# Patient Record
Sex: Female | Born: 1935 | Race: White | Hispanic: No | Marital: Single | Smoking: Never smoker
Health system: Southern US, Community
[De-identification: ages and names within clinical notes are randomized; demographics above are authoritative.]

## PROBLEM LIST (undated history)

## (undated) DIAGNOSIS — J45909 Unspecified asthma, uncomplicated: Secondary | ICD-10-CM

## (undated) DIAGNOSIS — C210 Malignant neoplasm of anus, unspecified: Secondary | ICD-10-CM

## (undated) DIAGNOSIS — D649 Anemia, unspecified: Secondary | ICD-10-CM

## (undated) DIAGNOSIS — I10 Essential (primary) hypertension: Secondary | ICD-10-CM

## (undated) DIAGNOSIS — E785 Hyperlipidemia, unspecified: Secondary | ICD-10-CM

## (undated) DIAGNOSIS — I509 Heart failure, unspecified: Secondary | ICD-10-CM

## (undated) HISTORY — DX: Essential (primary) hypertension: I10

## (undated) HISTORY — DX: Malignant neoplasm of anus, unspecified: C21.0

## (undated) HISTORY — DX: Hyperlipidemia, unspecified: E78.5

## (undated) HISTORY — PX: OTHER SURGICAL HISTORY: SHX169

## (undated) HISTORY — PX: TONSILLECTOMY: SUR1361

## (undated) HISTORY — PX: BLADDER SURGERY: SHX569

## (undated) HISTORY — PX: CARDIAC CATHETERIZATION: SHX172

## (undated) HISTORY — PX: COLOSTOMY: SHX63

## (undated) MED FILL — Iron Sucrose Inj 20 MG/ML (Fe Equiv): INTRAVENOUS | Qty: 10 | Status: AC

---

## 2004-05-13 ENCOUNTER — Inpatient Hospital Stay: Payer: Self-pay | Admitting: Internal Medicine

## 2004-05-30 ENCOUNTER — Ambulatory Visit: Payer: Self-pay | Admitting: Family Medicine

## 2004-05-30 IMAGING — US ABDOMEN ULTRASOUND
1 series · 17 of 25 positions shown · non-contrast
Comparison: none

REASON FOR EXAM: Epigastric pain
COMMENTS:

[Series 1: abdomen ultrasound · 17 of 80 slices shown]
[im 1/80]
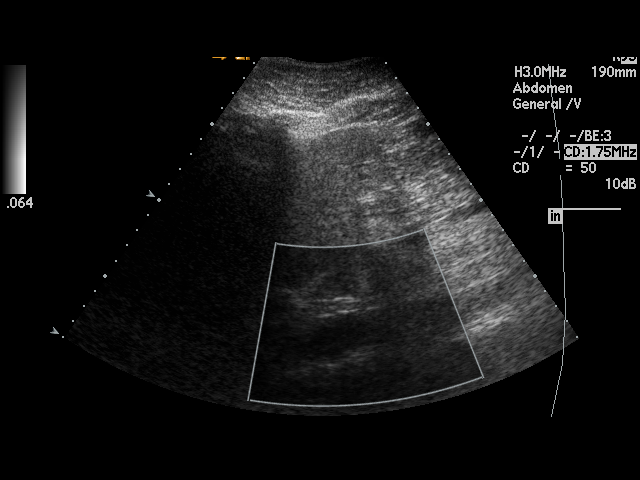
[im 7/80]
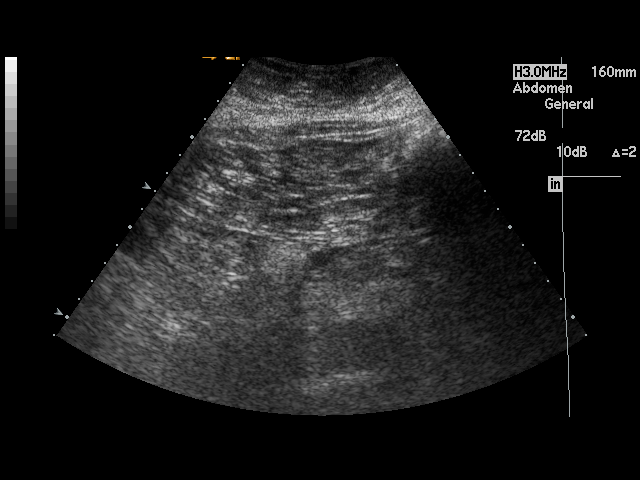
[im 10/80]
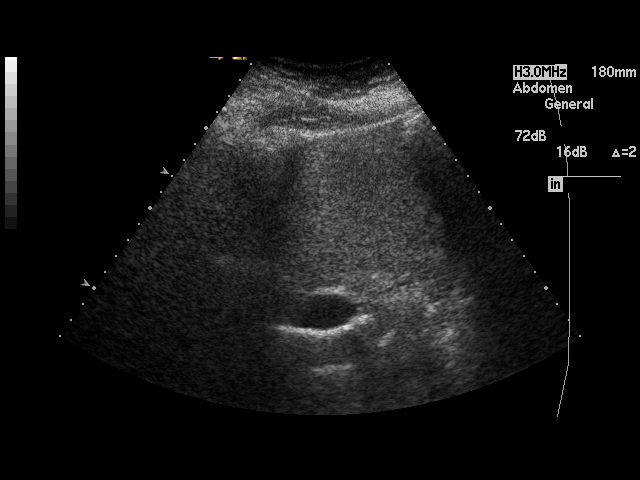
[im 17/80]
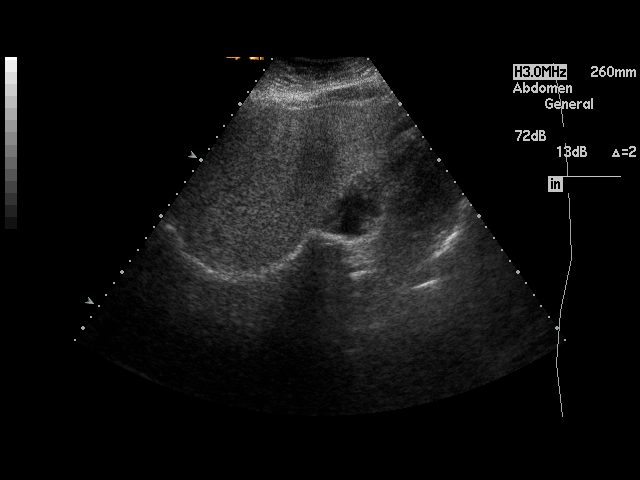
[im 20/80]
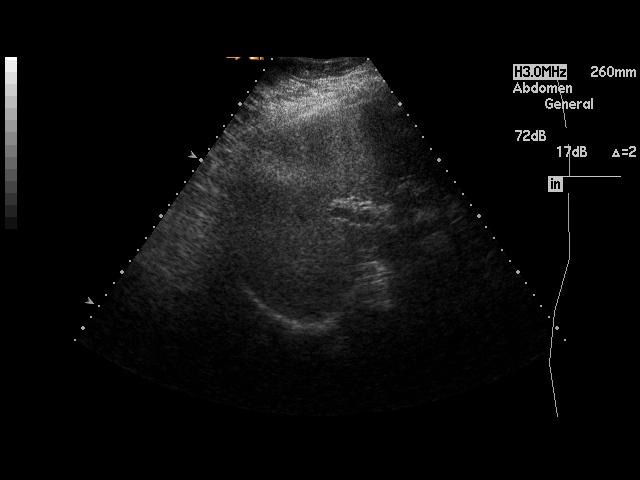
[im 27/80]
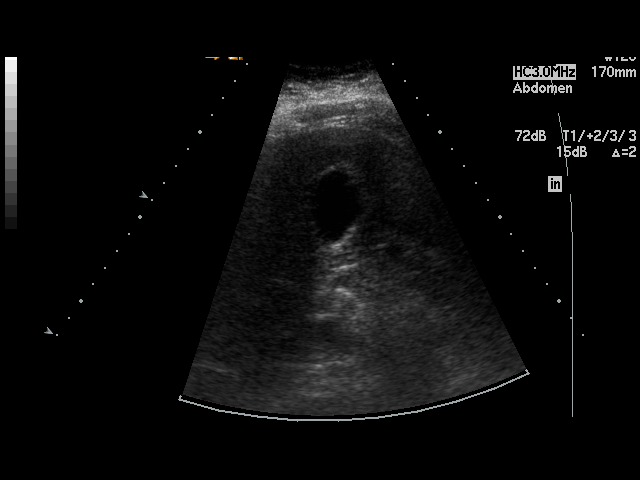
[im 30/80]
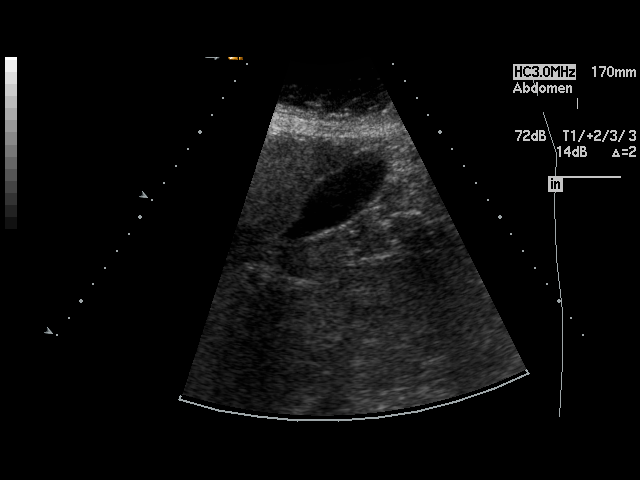
[im 37/80]
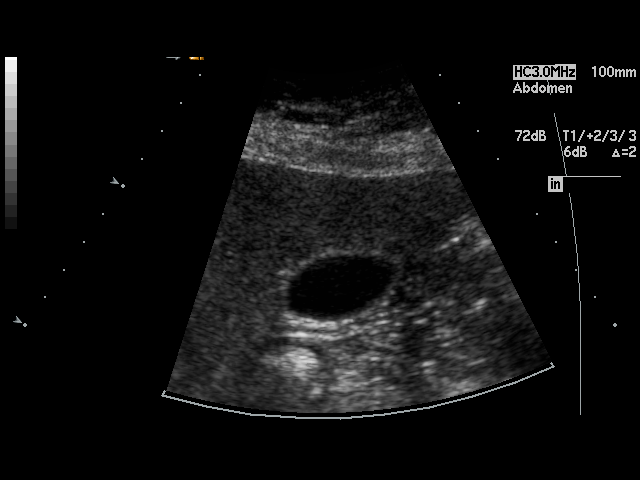
[im 40/80]
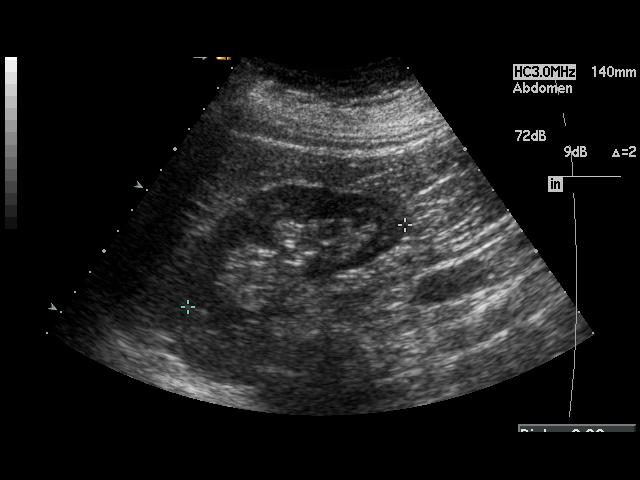
[im 43/80]
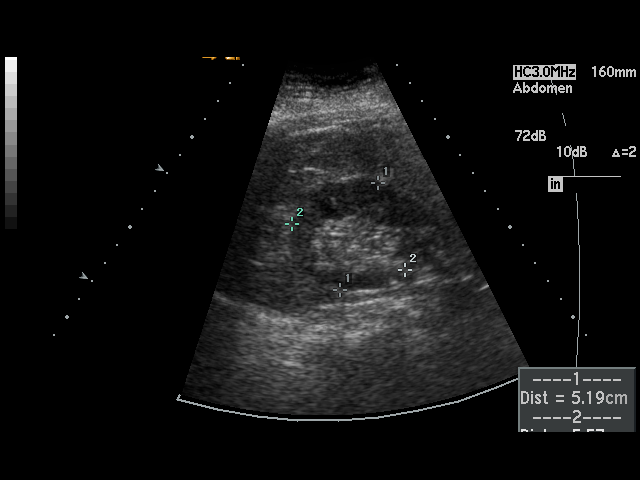
[im 50/80]
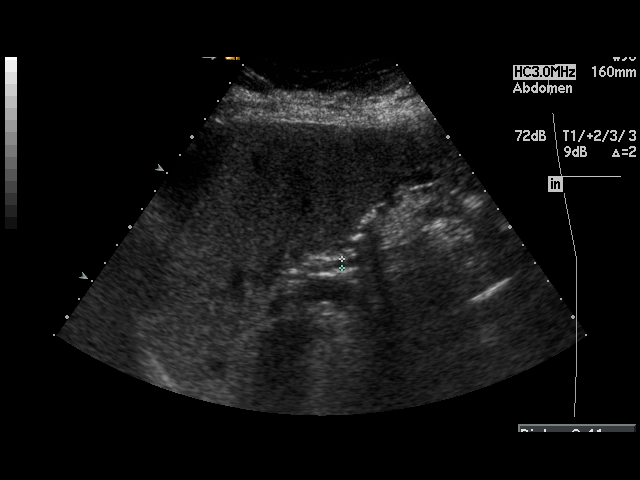
[im 53/80]
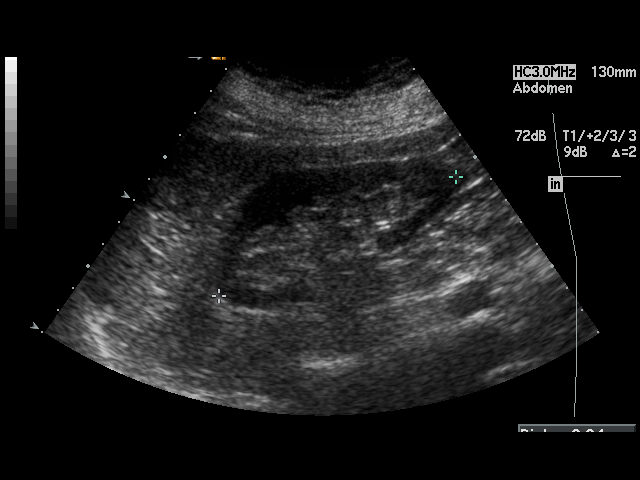
[im 60/80]
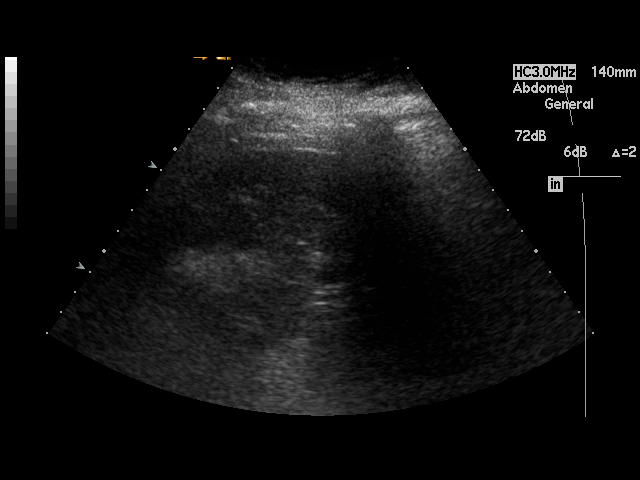
[im 63/80]
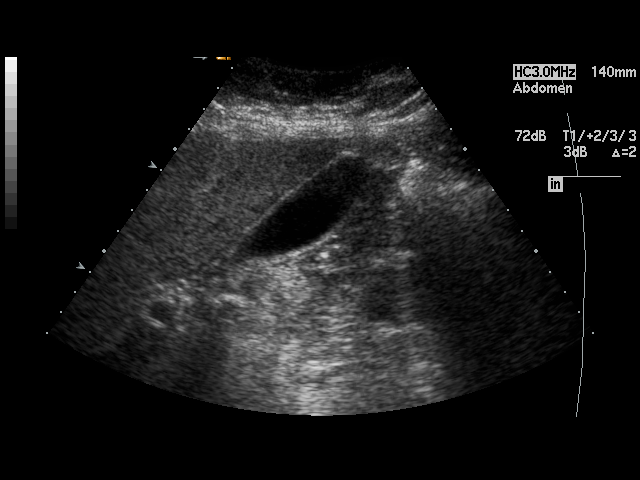
[im 70/80]
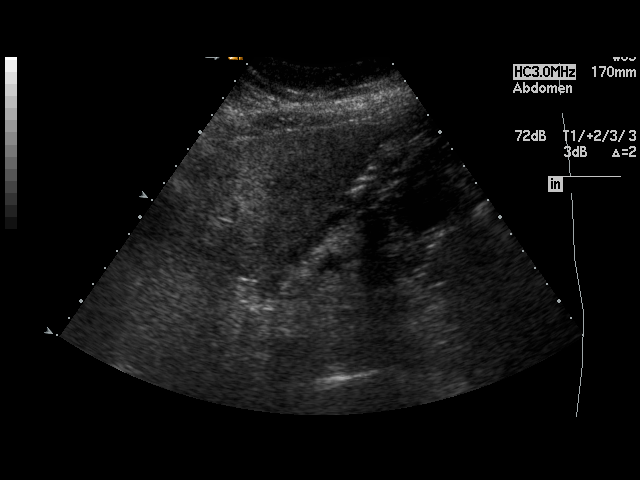
[im 73/80]
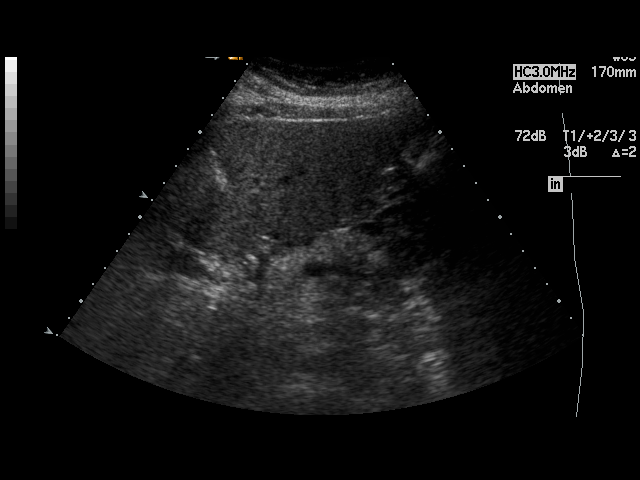
[im 80/80]
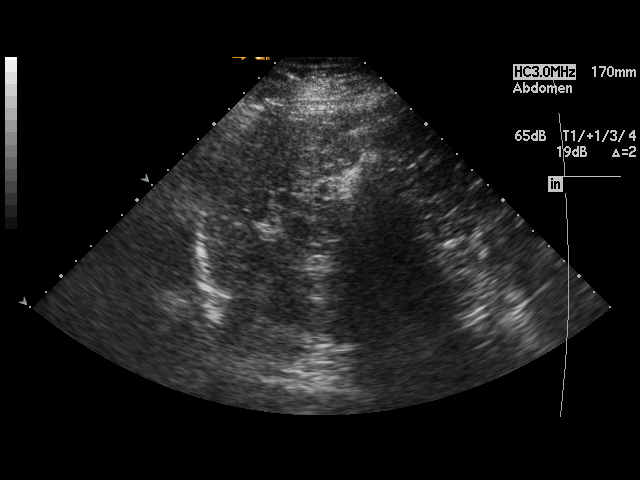

[17 of 25 positions shown; findings below may reference images not displayed]

PROCEDURE:     US  - US ABDOMEN GENERAL SURVEY  - [DATE]  [DATE]

RESULT:     The liver, spleen, and pancreas are normal in appearance.  A
portion of the pancreatic tail is obscured.  No gallstones are seen.  There
is no thickening of the gallbladder wall.  The common bile duct measures
mm in diameter which is within normal limits.  The kidneys show no
hydronephrosis.  There is no ascites.
IMPRESSION: No significant abnormalities are noted.

## 2005-10-22 ENCOUNTER — Ambulatory Visit: Payer: Self-pay | Admitting: Family Medicine

## 2006-02-11 ENCOUNTER — Ambulatory Visit: Payer: Self-pay | Admitting: Family Medicine

## 2006-02-11 IMAGING — US US SOFT TISSUE EXCLUDE HEAD/NECK
1 series · 13 of 13 positions shown · non-contrast
Comparison: none

REASON FOR EXAM: right axilla nodule
COMMENTS:

PROCEDURE:     US  - US SOFT TISSUE, NOT NECK /  HEAD  - [DATE]  [DATE]
RESULT:          No solid nor cystic sonographic abnormalities are
demonstrated within the region of concern of the RIGHT axilla.

[Series 1: us soft tissue exclude head/neck · 13 of 13 slices shown]
[im 1/13]
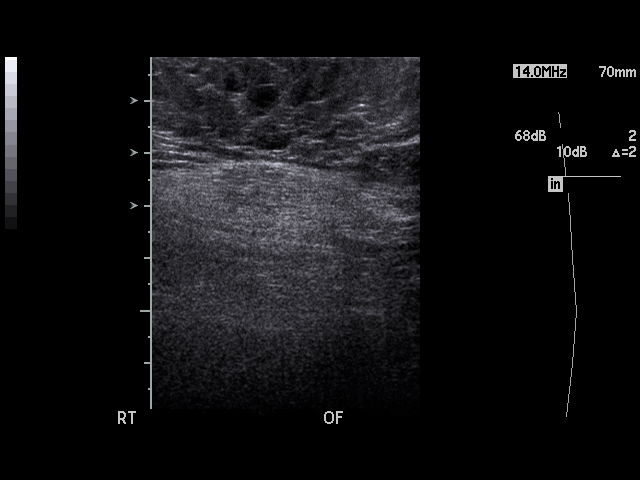
[im 2/13]
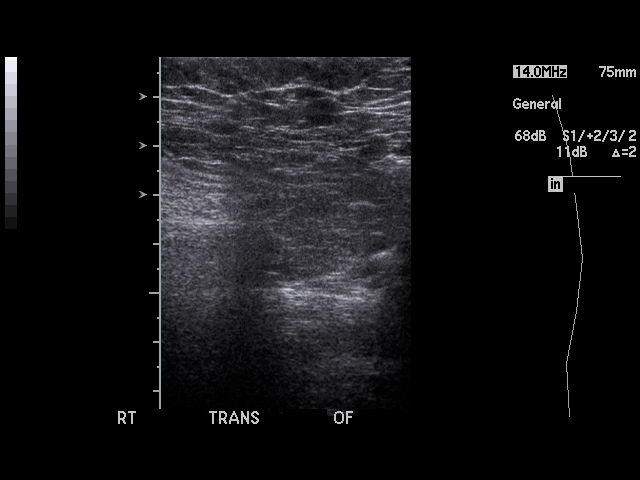
[im 3/13]
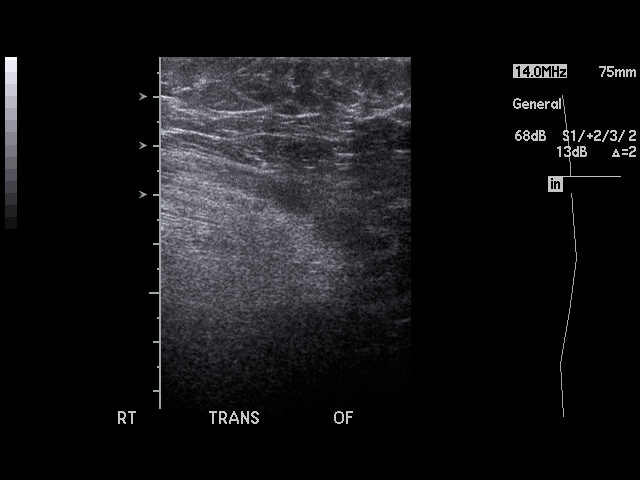
[im 4/13]
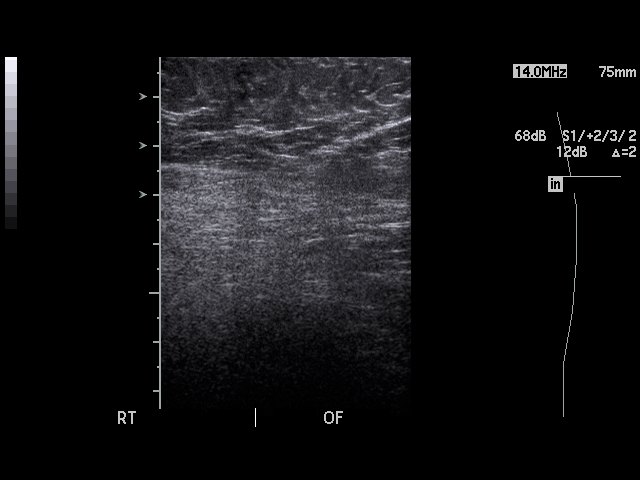
[im 5/13]
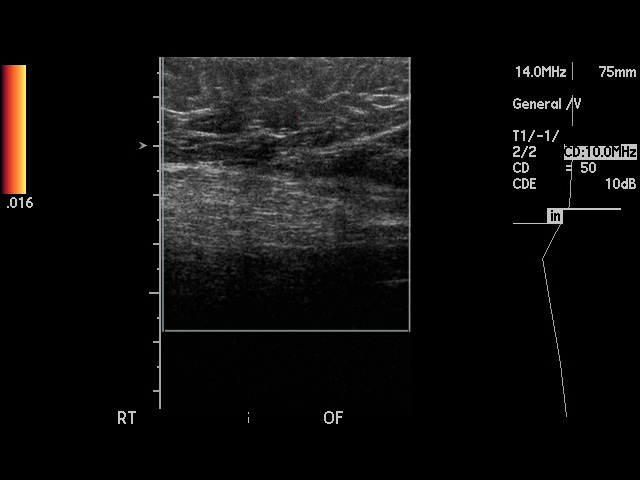
[im 6/13]
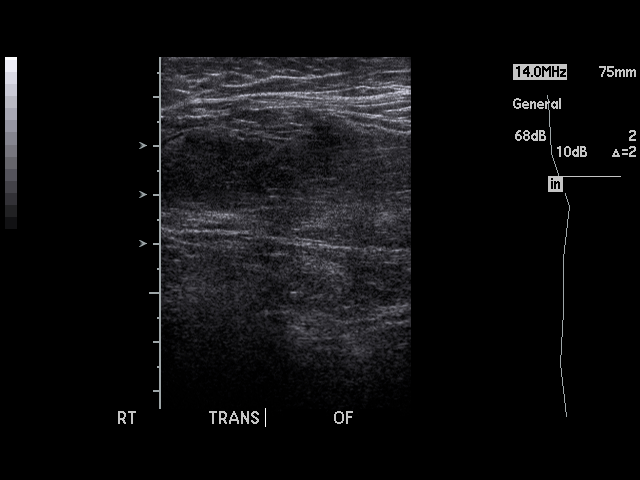
[im 7/13]
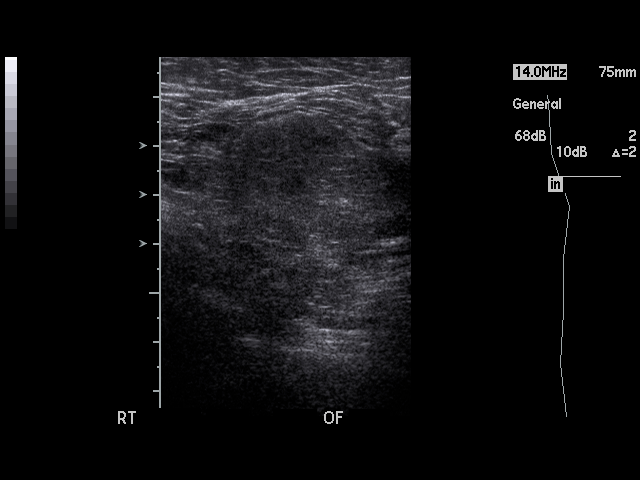
[im 8/13]
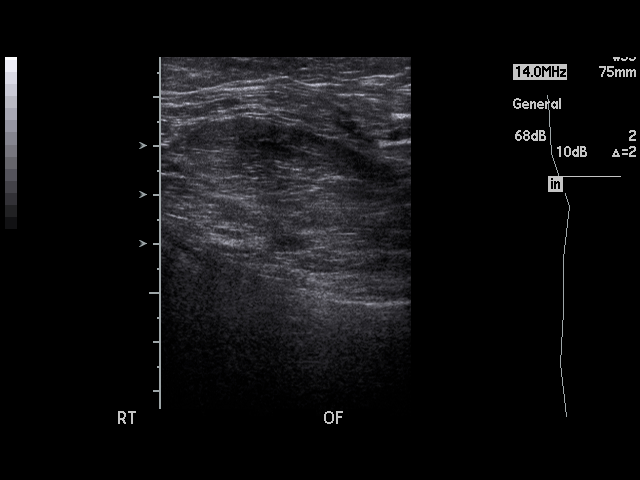
[im 9/13]
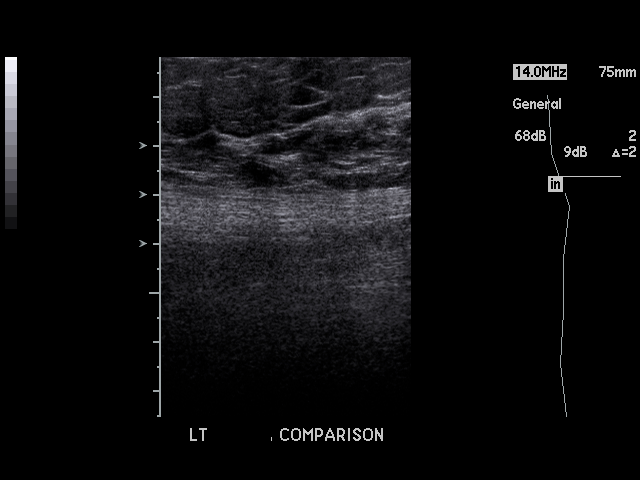
[im 10/13]
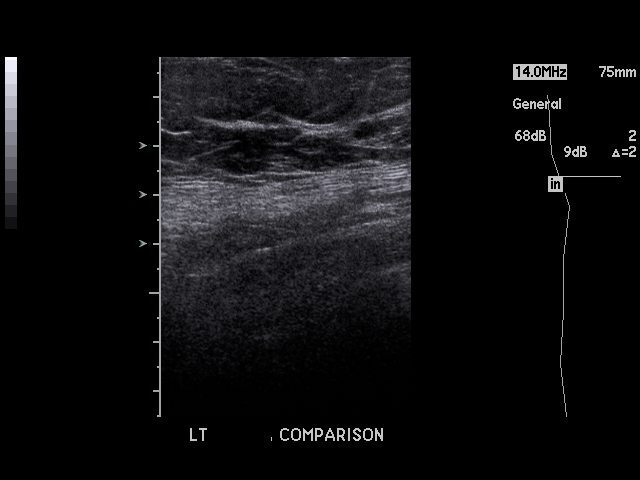
[im 11/13]
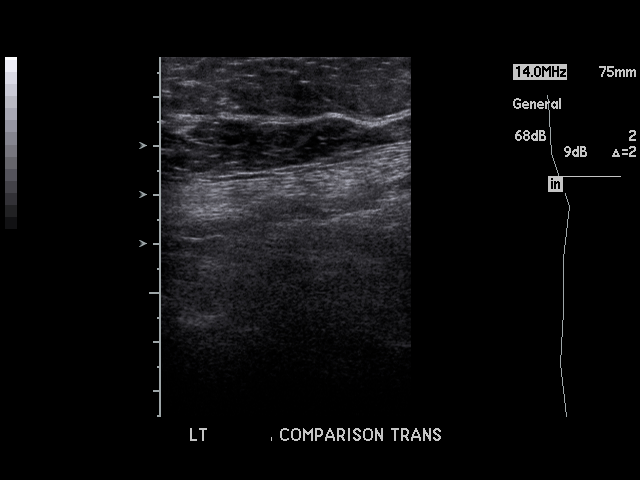
[im 12/13]
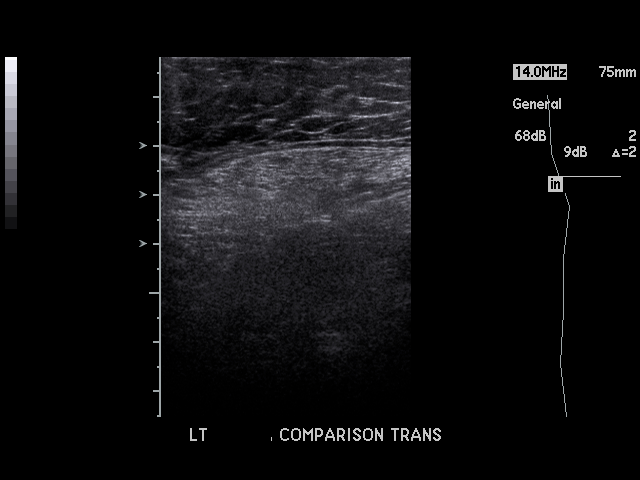
[im 13/13]
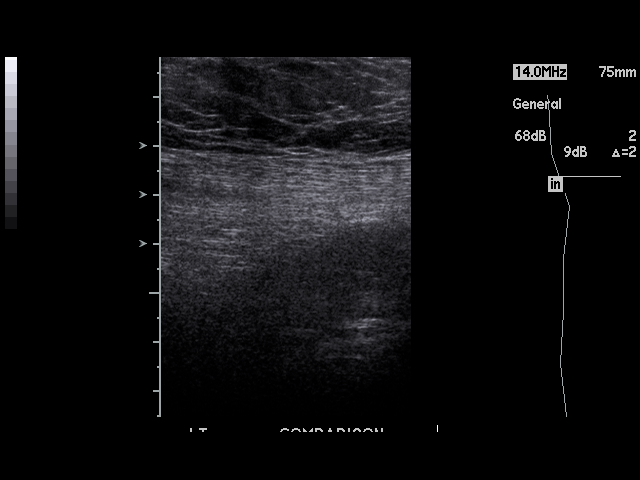

[13 of 13 positions shown; findings below may reference images not displayed]

IMPRESSION: Unremarkable focused ultrasound as described above.

## 2006-08-18 ENCOUNTER — Ambulatory Visit: Payer: Self-pay | Admitting: Unknown Physician Specialty

## 2007-04-13 ENCOUNTER — Ambulatory Visit: Payer: Self-pay | Admitting: Family Medicine

## 2007-08-30 ENCOUNTER — Ambulatory Visit: Payer: Self-pay | Admitting: Internal Medicine

## 2007-08-30 IMAGING — US US EXTREM LOW VENOUS*R*
1 series · 17 of 24 positions shown · non-contrast
Comparison: none

REASON FOR EXAM: Rt Leg Pain Swelling Eval DVt Call Report [[email or web address]_NUMBER]
COMMENTS:

[Series 1: us extrem low venous*right* · 17 of 28 slices shown]
[im 1/28]
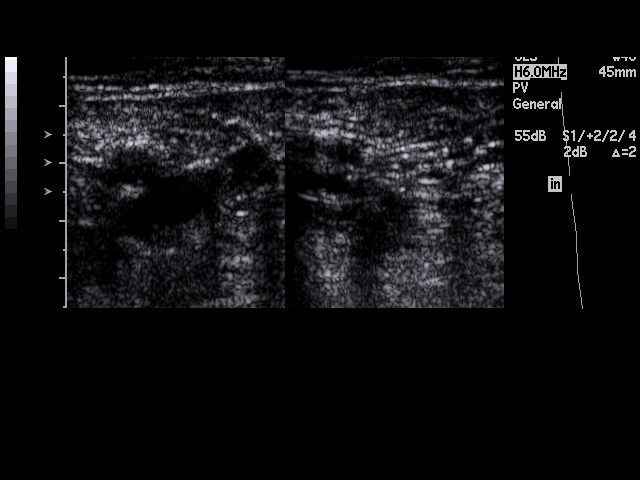
[im 3/28]
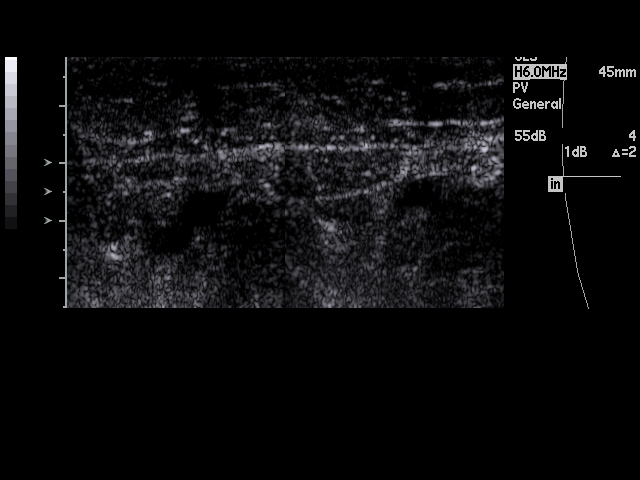
[im 4/28]
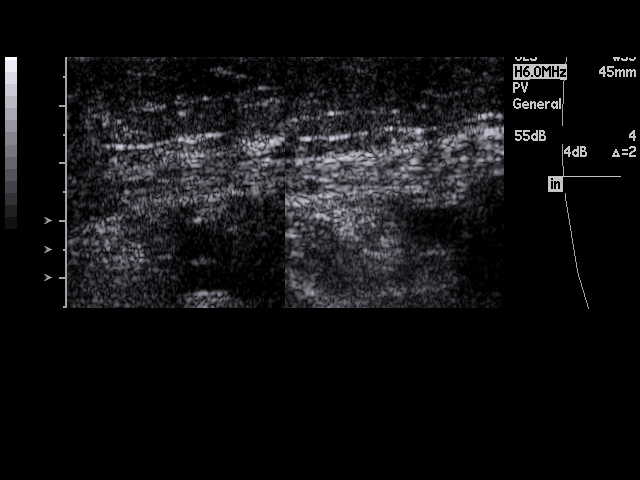
[im 5/28]
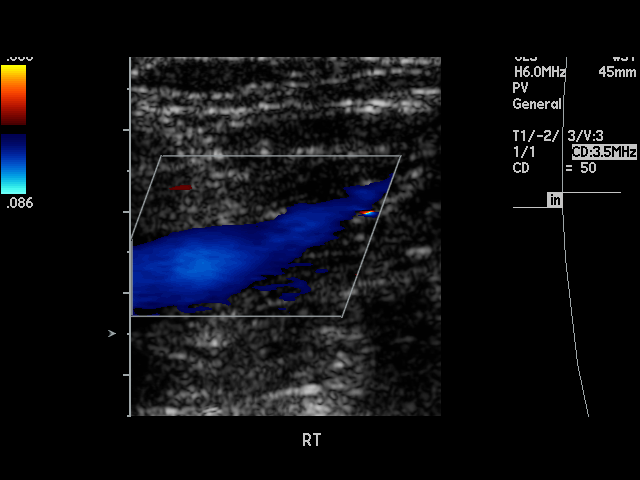
[im 8/28]
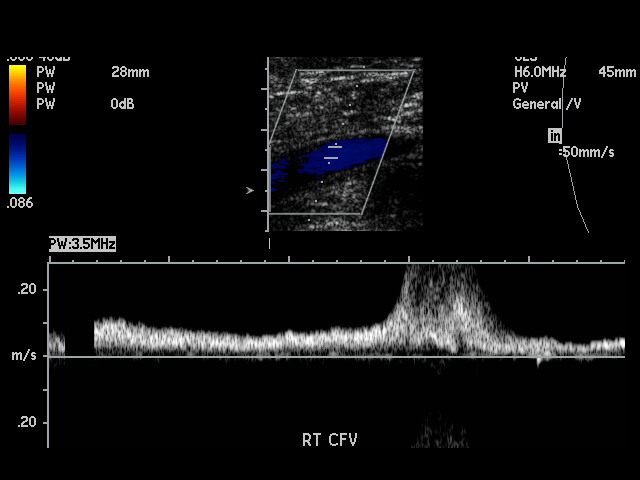
[im 9/28]
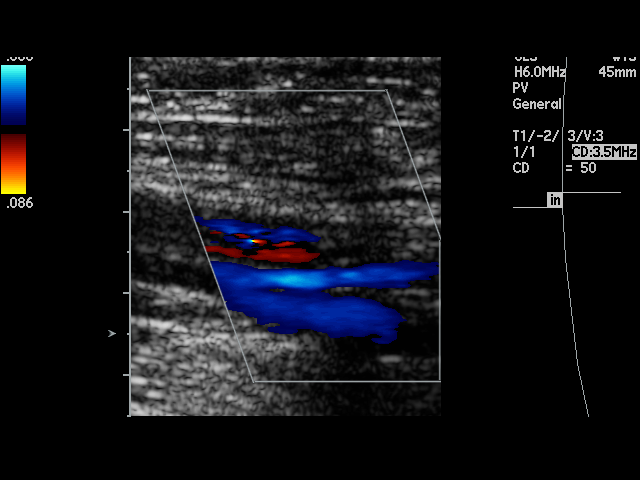
[im 11/28]
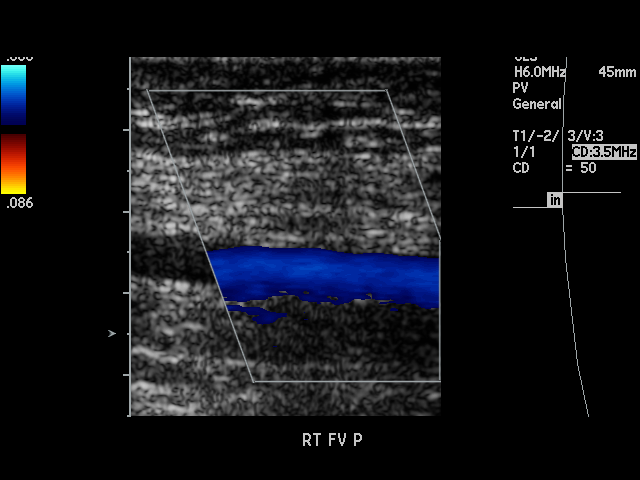
[im 12/28]
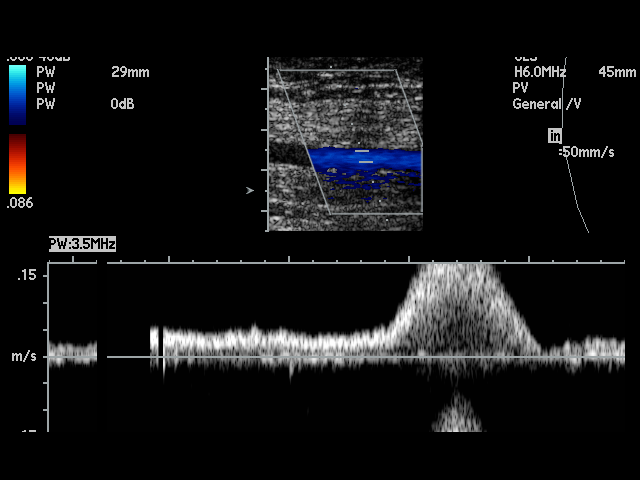
[im 15/28]
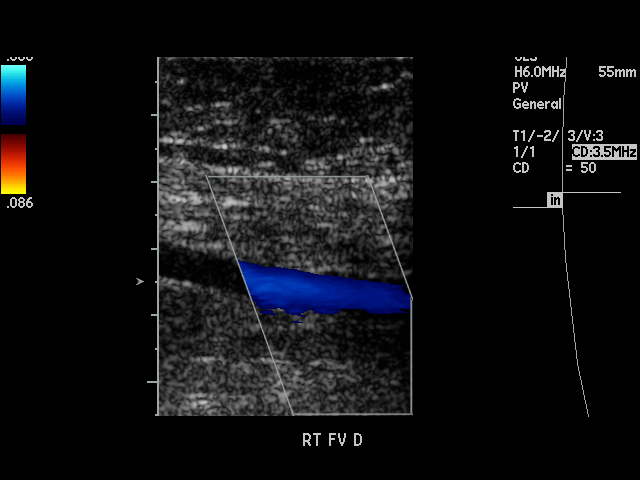
[im 16/28]
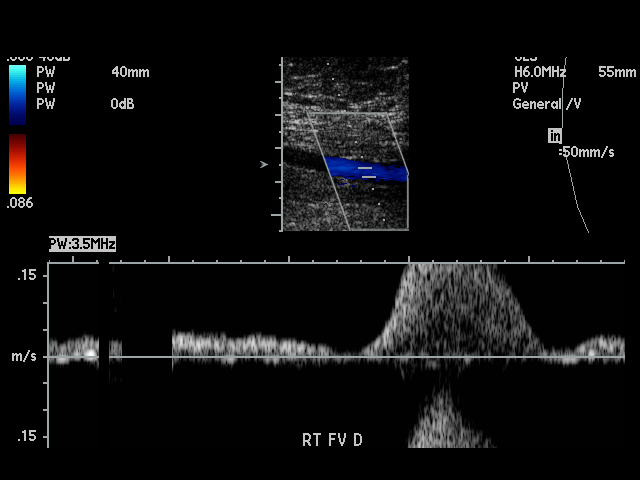
[im 17/28]
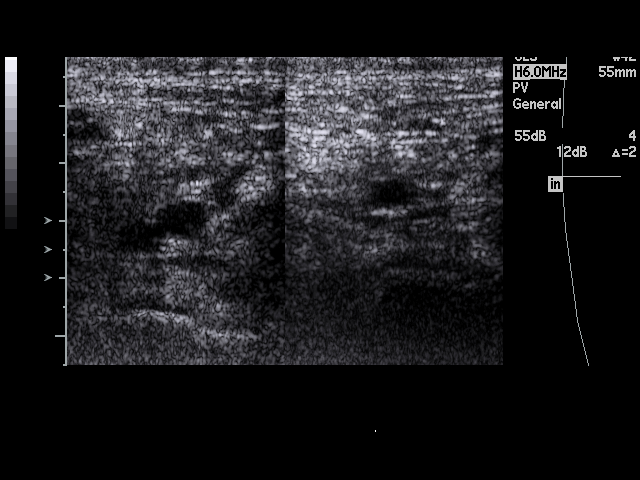
[im 19/28]
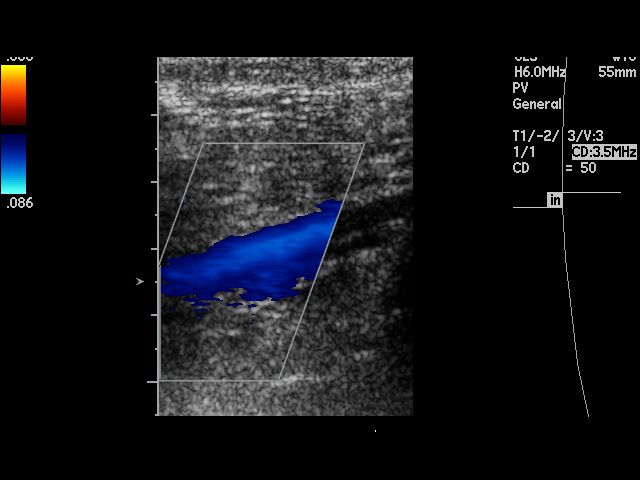
[im 20/28]
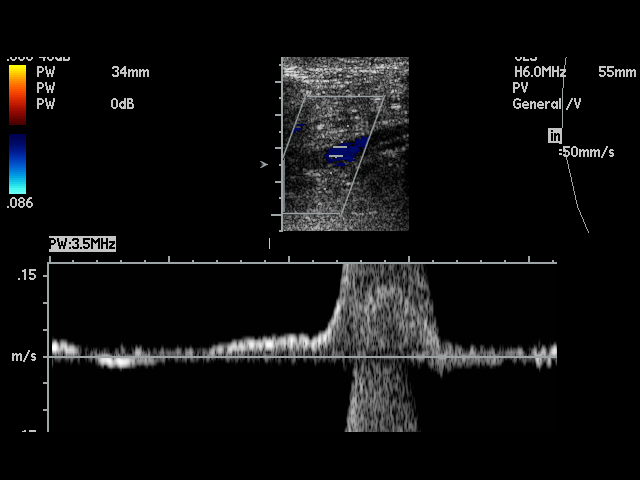
[im 23/28]
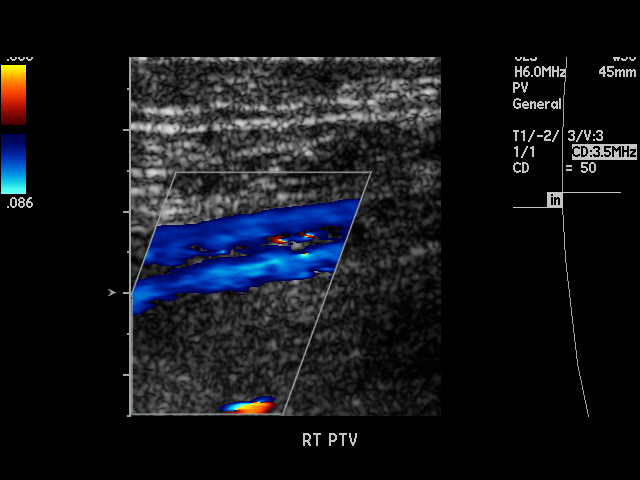
[im 24/28]
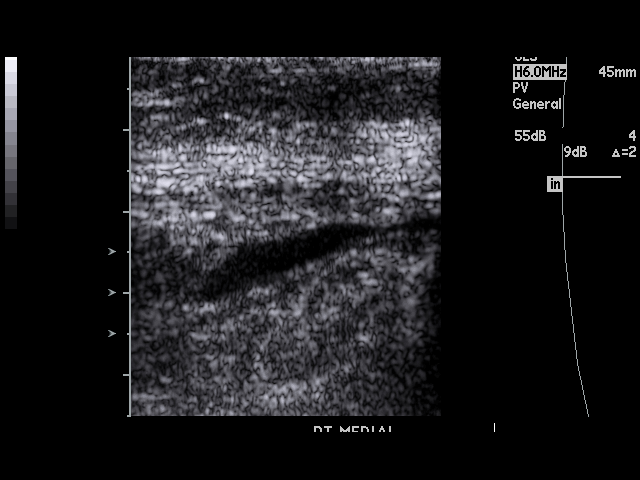
[im 25/28]
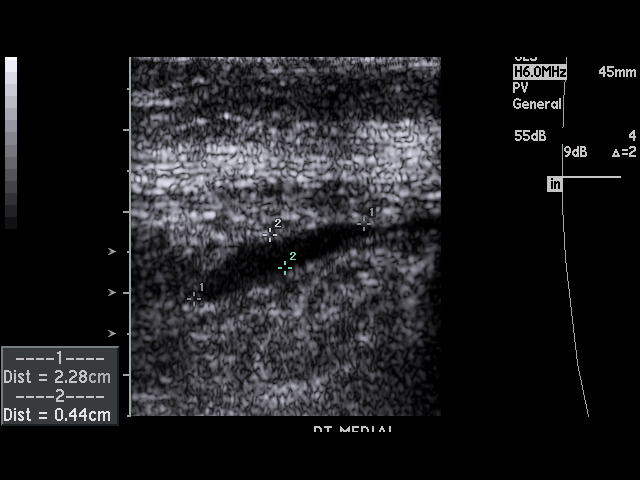
[im 28/28]
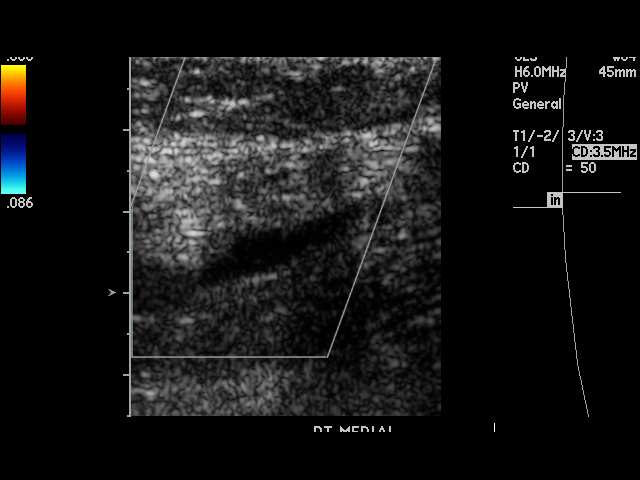

[17 of 24 positions shown; findings below may reference images not displayed]

PROCEDURE:     US  - US DOPPLER LOW EXTR RIGHT  - [DATE] [DATE]

RESULT:     Duplex Doppler interrogation of the deep venous system of the
RIGHT leg is performed from the inguinal through the popliteal region. There
is normal compressibility. The color Doppler and spectral Doppler appearance
is normal. There is increased flow during distal calf compression.
Incidental note is made of a small fluid collection in the popliteal region
medially consistent with a small popliteal fossa cyst measuring 2.28 cm in
length x 4.4 mm x 1.2 cm.
IMPRESSION: 1.     No evidence of RIGHT lower extremity DVT.
2.     Popliteal fossa cyst present.

## 2008-09-07 ENCOUNTER — Ambulatory Visit: Payer: Self-pay | Admitting: Family Medicine

## 2009-02-06 ENCOUNTER — Ambulatory Visit: Payer: Self-pay | Admitting: Family Medicine

## 2009-09-18 ENCOUNTER — Ambulatory Visit: Payer: Self-pay | Admitting: Family Medicine

## 2009-10-10 ENCOUNTER — Ambulatory Visit: Payer: Self-pay | Admitting: Family Medicine

## 2010-01-10 ENCOUNTER — Ambulatory Visit: Payer: Self-pay | Admitting: Unknown Physician Specialty

## 2010-11-13 ENCOUNTER — Encounter: Payer: Self-pay | Admitting: Cardiovascular Disease

## 2010-11-13 ENCOUNTER — Ambulatory Visit (INDEPENDENT_AMBULATORY_CARE_PROVIDER_SITE_OTHER): Payer: Medicare Other | Admitting: Cardiovascular Disease

## 2010-11-13 DIAGNOSIS — E119 Type 2 diabetes mellitus without complications: Secondary | ICD-10-CM

## 2010-11-13 DIAGNOSIS — I10 Essential (primary) hypertension: Secondary | ICD-10-CM | POA: Insufficient documentation

## 2010-11-13 DIAGNOSIS — I2510 Atherosclerotic heart disease of native coronary artery without angina pectoris: Secondary | ICD-10-CM

## 2010-11-13 DIAGNOSIS — E785 Hyperlipidemia, unspecified: Secondary | ICD-10-CM | POA: Insufficient documentation

## 2010-11-13 DIAGNOSIS — E669 Obesity, unspecified: Secondary | ICD-10-CM

## 2010-11-13 NOTE — Assessment & Plan Note (Signed)
Blood pressure is well controlled on today's visit. No changes made to the medications. 

## 2010-11-13 NOTE — Assessment & Plan Note (Signed)
We have encouraged continued exercise, careful diet management in an effort to lose weight. 

## 2010-11-13 NOTE — Patient Instructions (Signed)
You are doing well. No medication changes were made. Please call us if you have new issues that need to be addressed before your next appt.  We will call you for a follow up Appt. In 12 months  

## 2010-11-13 NOTE — Progress Notes (Signed)
Patient ID: Sarah Potts, female    DOB: [birth year], 77 y.o.   MRN: [id number]  HPI Comments: Sarah Potts is a very pleasant 77 year old woman with a history of coronary artery disease diagnosed by cardiac catheterization in 2006, diabetes, hypertension, hyperlipidemia who presents for new patient evaluation.  She reports that overall she has been doing well. Her diabetes has been relatively well controlled with hemoglobin A1c 6.6. LDL is also very well controlled with LDL Around the low 40s. She is able to perform all of her ADLs though does not participate in a regular exercise program. She appears to be doing well on her current medications with no significant side effects. She does have chronic hip pain which limits her ability to exercise.  Cardiac catheterization in 2006 showed 30 and 50% proximal LAD disease, 30% mid LAD disease, 25% diagonal disease, 25% circumflex disease, 40% RCA disease.  Stress test in 2009 she exercised for only 3 minutes achieved 2.3 minutes, did not achieve adequate heart rate no new 81% of her predicted heart rate achieved. No significant ischemia with normal ejection fraction.  Echocardiogram performed in June 2008 showed normal LV systolic function with mild MR and TR.  EKG today shows normal sinus rhythm with rate 64 beats per minute with no significant ST or T wave changes  Outpatient Encounter Prescriptions as of 11/13/2010  Medication Sig Dispense Refill  . aspirin 325 MG tablet Take 325 mg by mouth daily.        Marland Kitchen atenolol (TENORMIN) 50 MG tablet Take 50 mg by mouth 2 (two) times daily.        . cetirizine (ZYRTEC) 10 MG chewable tablet Chew 10 mg by mouth daily.        . Cholecalciferol (VITAMIN D3) 50000 UNITS CAPS Take 50,000 Units by mouth once a week.        . citalopram (CELEXA) 10 MG tablet Take 10 mg by mouth daily.        . Cyanocobalamin (VITAMIN B-12) 2500 MCG SUBL Place 2,500 mcg under the tongue once.        . ezetimibe-simvastatin (VYTORIN)  10-40 MG per tablet Take 1 tablet by mouth at bedtime.        . gabapentin (NEURONTIN) 100 MG capsule Take 100 mg by mouth 3 (three) times daily.        Marland Kitchen glipiZIDE (GLUCOTROL) 5 MG tablet Take one tablet every am and 1/2 tablet in the pm       . losartan (COZAAR) 100 MG tablet Take 100 mg by mouth daily.        . metFORMIN (GLUMETZA) 1000 MG (MOD) 24 hr tablet Take 1,000 mg by mouth 2 (two) times daily with a meal.        . montelukast (SINGULAIR) 10 MG tablet Take 10 mg by mouth at bedtime.        Marland Kitchen omega-3 acid ethyl esters (LOVAZA) 1 G capsule Take 2 g by mouth 2 (two) times daily.        Marland Kitchen omeprazole (PRILOSEC) 40 MG capsule Take 40 mg by mouth daily.            Review of Systems  Constitutional: Negative.   HENT: Negative.   Eyes: Negative.   Respiratory: Negative.   Cardiovascular: Negative.   Gastrointestinal: Negative.   Musculoskeletal: Negative.   Skin: Negative.   Neurological: Negative.   Hematological: Negative.   Psychiatric/Behavioral: Negative.   All other systems reviewed and are negative.  BP 146/84  Pulse 73  Ht 5\' 3"  (1.6 m)  Wt 198 lb (89.812 kg)  BMI 35.07 kg/m2   Physical Exam  Nursing note and vitals reviewed. Constitutional: She is oriented to person, place, and time. She appears well-developed and well-nourished.       obese  HENT:  Head: Normocephalic.  Nose: Nose normal.  Mouth/Throat: Oropharynx is clear and moist.  Eyes: Conjunctivae are normal. Pupils are equal, round, and reactive to light.  Neck: Normal range of motion. Neck supple. No JVD present.  Cardiovascular: Normal rate, regular rhythm, S1 normal, S2 normal, normal heart sounds and intact distal pulses.  Exam reveals no gallop and no friction rub.   No murmur heard. Pulmonary/Chest: Effort normal and breath sounds normal. No respiratory distress. She has no wheezes. She has no rales. She exhibits no tenderness.  Abdominal: Soft. Bowel sounds are normal. She exhibits no  distension. There is no tenderness.  Musculoskeletal: Normal range of motion. She exhibits no edema and no tenderness.  Lymphadenopathy:    She has no cervical adenopathy.  Neurological: She is alert and oriented to person, place, and time. Coordination normal.  Skin: Skin is warm and dry. No rash noted. No erythema.  Psychiatric: She has a normal mood and affect. Her behavior is normal. Judgment and thought content normal.        Assessment and Plan

## 2010-11-13 NOTE — Assessment & Plan Note (Signed)
Cholesterol is at goal on the current lipid regimen. No changes to the medications were made.  

## 2010-11-13 NOTE — Assessment & Plan Note (Signed)
Currently with no symptoms of angina. No further workup at this time. Continue current medication regimen. 

## 2010-11-13 NOTE — Assessment & Plan Note (Signed)
She will have a difficult time with weight loss secondary to her chronic hip pain. She could try water aerobics or biking.

## 2010-11-19 ENCOUNTER — Encounter: Payer: Self-pay | Admitting: Cardiovascular Disease

## 2011-03-03 ENCOUNTER — Ambulatory Visit: Payer: Self-pay | Admitting: Family Medicine

## 2011-11-19 ENCOUNTER — Ambulatory Visit (INDEPENDENT_AMBULATORY_CARE_PROVIDER_SITE_OTHER): Payer: Medicare Other | Admitting: Cardiovascular Disease

## 2011-11-19 ENCOUNTER — Encounter: Payer: Self-pay | Admitting: Cardiovascular Disease

## 2011-11-19 VITALS — BP 120/68 | HR 72 | Ht 62.0 in | Wt 187.5 lb

## 2011-11-19 DIAGNOSIS — I10 Essential (primary) hypertension: Secondary | ICD-10-CM

## 2011-11-19 DIAGNOSIS — E119 Type 2 diabetes mellitus without complications: Secondary | ICD-10-CM

## 2011-11-19 DIAGNOSIS — I2510 Atherosclerotic heart disease of native coronary artery without angina pectoris: Secondary | ICD-10-CM

## 2011-11-19 DIAGNOSIS — E785 Hyperlipidemia, unspecified: Secondary | ICD-10-CM

## 2011-11-19 NOTE — Assessment & Plan Note (Signed)
Currently with no symptoms of angina. No further workup at this time. Continue current medication regimen. 

## 2011-11-19 NOTE — Progress Notes (Signed)
Patient ID: Sarah Potts, female    DOB: November 27, 1932, 78 y.o.   MRN: [id number]  HPI Comments: Sarah Potts is a very pleasant 78 year old woman with a history of coronary artery disease diagnosed by cardiac catheterization in 2006, diabetes, hypertension, hyperlipidemia who presents for new patient evaluation.  She reports having fatigue and poor sleep. She has been more active and walks her dog on a regular basis. She did have a fall in July 2013 and fell on her knees. Her left knee continues to cause significant discomfort. Her cholesterol medication was held for diffuse body aching but she has not had any relief of her symptoms and feels that she can restart her cholesterol medication. She did use weight loss diet and lost 10 pounds but was unable to afford the food and now she will do this on her own.  When she was on her cholesterol medication, total cholesterol 141, LDL 66, hemoglobin A1c 6.7  Cardiac catheterization in 2006 showed 30 and 50% proximal LAD disease, 30% mid LAD disease, 25% diagonal disease, 25% circumflex disease, 40% RCA disease.  Stress test in 2009 she exercised for only 3 minutes achieved 2.3 minutes, did not achieve adequate heart rate no new 81% of her predicted heart rate achieved. No significant ischemia with normal ejection fraction.  Echocardiogram performed in June 2008 showed normal LV systolic function with mild MR and TR.  EKG today shows normal sinus rhythm with rate 72 beats per minute with no significant ST or T wave changes, rare PVC  Outpatient Encounter Prescriptions as of 11/19/2011  Medication Sig Dispense Refill  . aspirin 325 MG tablet Take 325 mg by mouth daily.        Marland Kitchen atenolol (TENORMIN) 50 MG tablet Take 50 mg by mouth 2 (two) times daily.        . cetirizine (ZYRTEC) 10 MG chewable tablet Chew 10 mg by mouth daily.        . Cholecalciferol (VITAMIN D3) 50000 UNITS CAPS Take 50,000 Units by mouth once a week.        . citalopram (CELEXA) 10 MG  tablet Take 10 mg by mouth daily.        . Cyanocobalamin (VITAMIN B-12) 2500 MCG SUBL Place 2,500 mcg under the tongue once.        . gabapentin (NEURONTIN) 100 MG capsule Take 100 mg by mouth 3 (three) times daily.        Marland Kitchen losartan (COZAAR) 100 MG tablet Take 100 mg by mouth daily.        . metFORMIN (GLUMETZA) 1000 MG (MOD) 24 hr tablet Take 1,000 mg by mouth 2 (two) times daily with a meal.        . montelukast (SINGULAIR) 10 MG tablet Take 10 mg by mouth at bedtime.        Marland Kitchen omega-3 acid ethyl esters (LOVAZA) 1 G capsule Take 2 g by mouth 2 (two) times daily.        Marland Kitchen omeprazole (PRILOSEC) 40 MG capsule Take 40 mg by mouth daily.        Marland Kitchen ezetimibe-simvastatin (VYTORIN) 10-40 MG per tablet Take 1 tablet by mouth at bedtime.  ON HOLD      . glipiZIDE (GLUCOTROL) 5 MG tablet Take one tablet every am and 1/2 tablet in the pm  (ON HOLD)        Review of Systems  Constitutional: Negative.   HENT: Negative.   Eyes: Negative.   Respiratory: Negative.  Cardiovascular: Negative.   Gastrointestinal: Negative.   Musculoskeletal: Negative.   Skin: Negative.   Neurological: Negative.   Hematological: Negative.   Psychiatric/Behavioral: Negative.   All other systems reviewed and are negative.    BP 120/68  Pulse 72  Ht 5\' 2"  (1.575 m)  Wt 187 lb 8 oz (85.049 kg)  BMI 34.29 kg/m2  Physical Exam  Nursing note and vitals reviewed. Constitutional: She is oriented to person, place, and time. She appears well-developed and well-nourished.       obese  HENT:  Head: Normocephalic.  Nose: Nose normal.  Mouth/Throat: Oropharynx is clear and moist.  Eyes: Conjunctivae are normal. Pupils are equal, round, and reactive to light.  Neck: Normal range of motion. Neck supple. No JVD present.  Cardiovascular: Normal rate, regular rhythm, S1 normal, S2 normal, normal heart sounds and intact distal pulses.  Exam reveals no gallop and no friction rub.   No murmur heard. Pulmonary/Chest: Effort  normal and breath sounds normal. No respiratory distress. She has no wheezes. She has no rales. She exhibits no tenderness.  Abdominal: Soft. Bowel sounds are normal. She exhibits no distension. There is no tenderness.  Musculoskeletal: Normal range of motion. She exhibits no edema and no tenderness.  Lymphadenopathy:    She has no cervical adenopathy.  Neurological: She is alert and oriented to person, place, and time. Coordination normal.  Skin: Skin is warm and dry. No rash noted. No erythema.  Psychiatric: She has a normal mood and affect. Her behavior is normal. Judgment and thought content normal.        Assessment and Plan

## 2011-11-19 NOTE — Patient Instructions (Addendum)
You are doing well. No medication changes were made.  Please call us if you have new issues that need to be addressed before your next appt.  Your physician wants you to follow-up in: 12 months.  You will receive a reminder letter in the mail two months in advance. If you don't receive a letter, please call our office to schedule the follow-up appointment. 

## 2011-11-19 NOTE — Assessment & Plan Note (Signed)
Blood pressure is well controlled on today's visit. No changes made to the medications. 

## 2011-11-19 NOTE — Assessment & Plan Note (Signed)
We have encouraged continued exercise, careful diet management in an effort to lose weight. 

## 2011-11-19 NOTE — Assessment & Plan Note (Signed)
She was well controlled on vytorin. She is willing to restart the medication. She has followup with Dr. Kary Kos in the near future.

## 2012-04-04 ENCOUNTER — Emergency Department: Payer: Self-pay | Admitting: Emergency Medicine

## 2012-04-04 IMAGING — CR DG KNEE COMPLETE 4+V*L*
1 series · 4 of 4 positions shown · non-contrast
Comparison: none

REASON FOR EXAM: LEFT KNEE PAIN
COMMENTS:

PROCEDURE:     DXR - DXR KNEE LT COMP WITH OBLIQUES  - [DATE]  [DATE]
RESULT:     Comparison:  None

[Series 1: ap · 0.17mm/px · 4 of 4 slices shown]
[im 1/4]
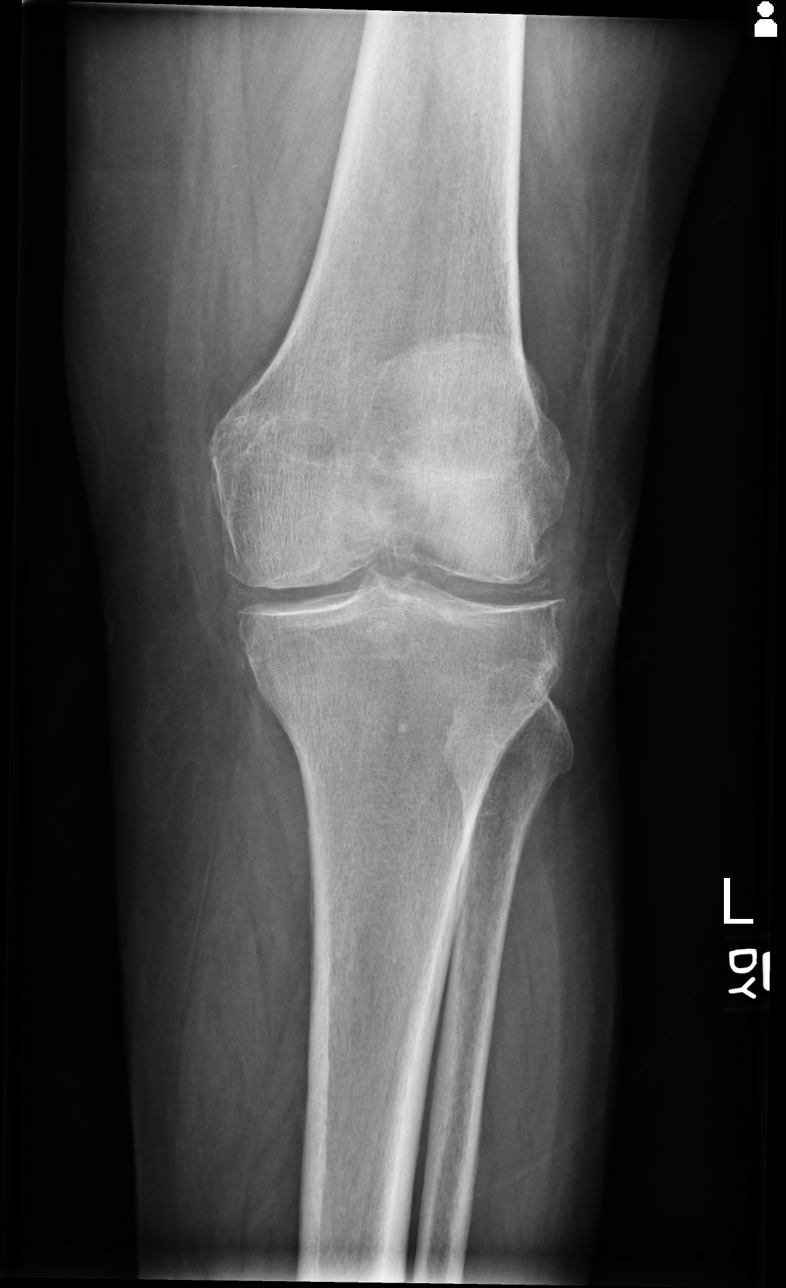
[im 2/4]
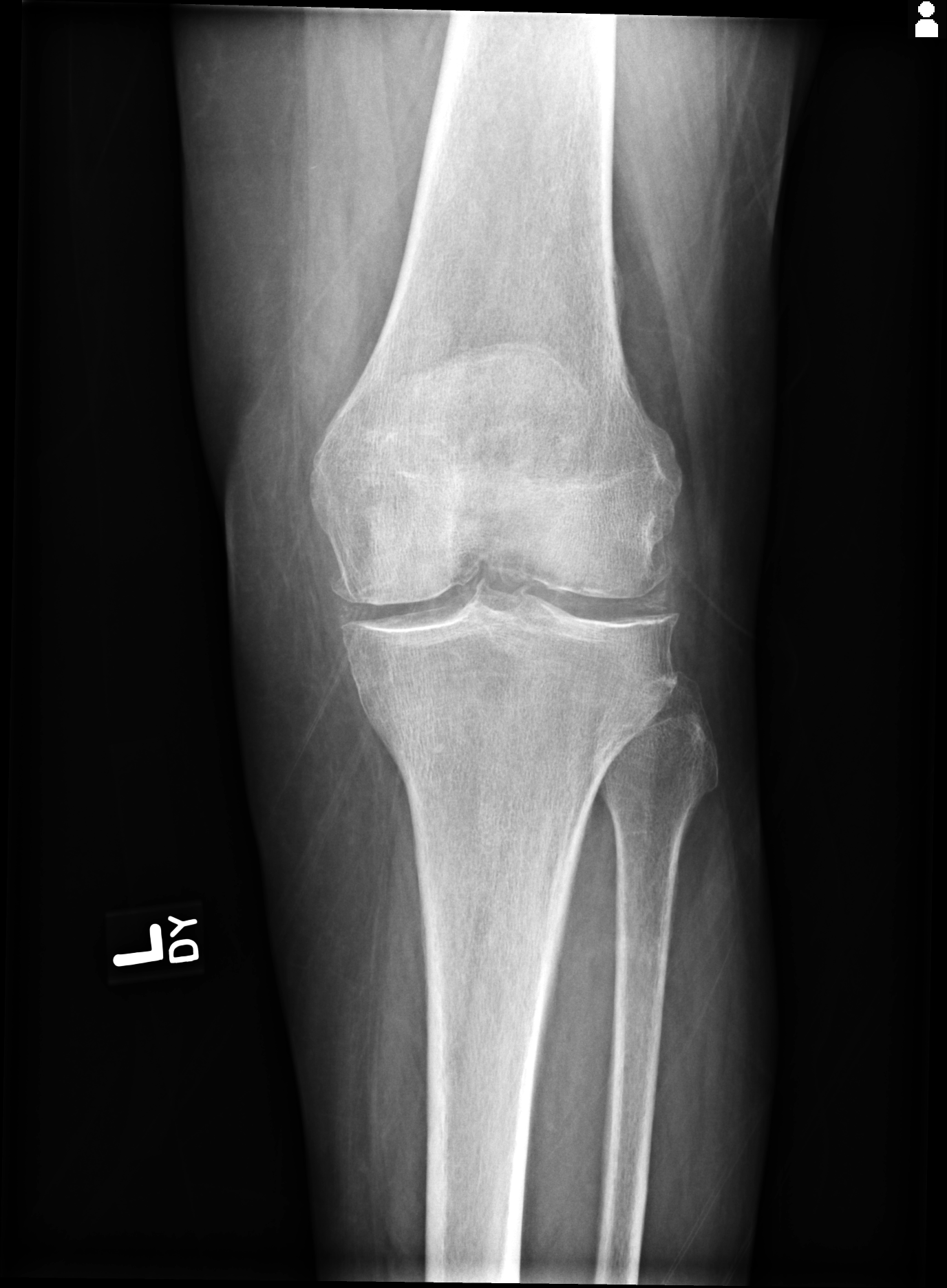
[im 3/4]
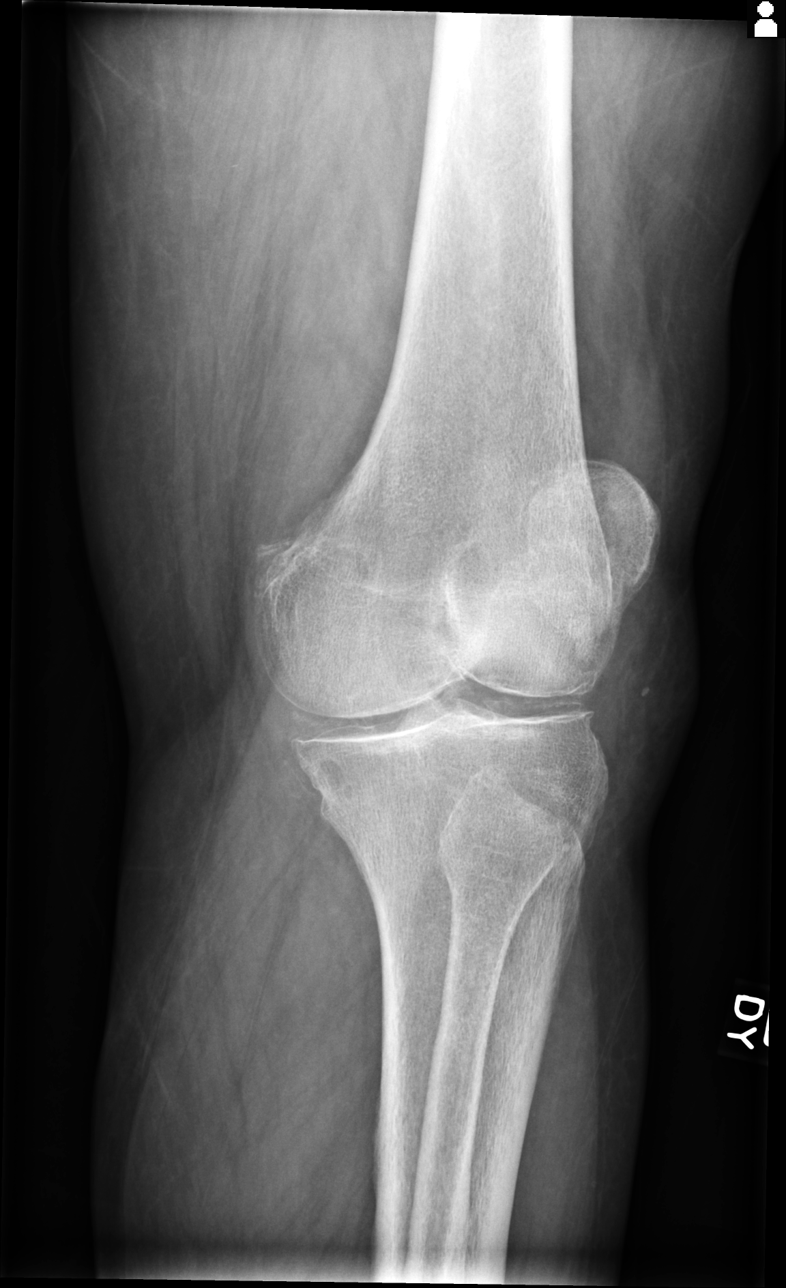
[im 4/4]
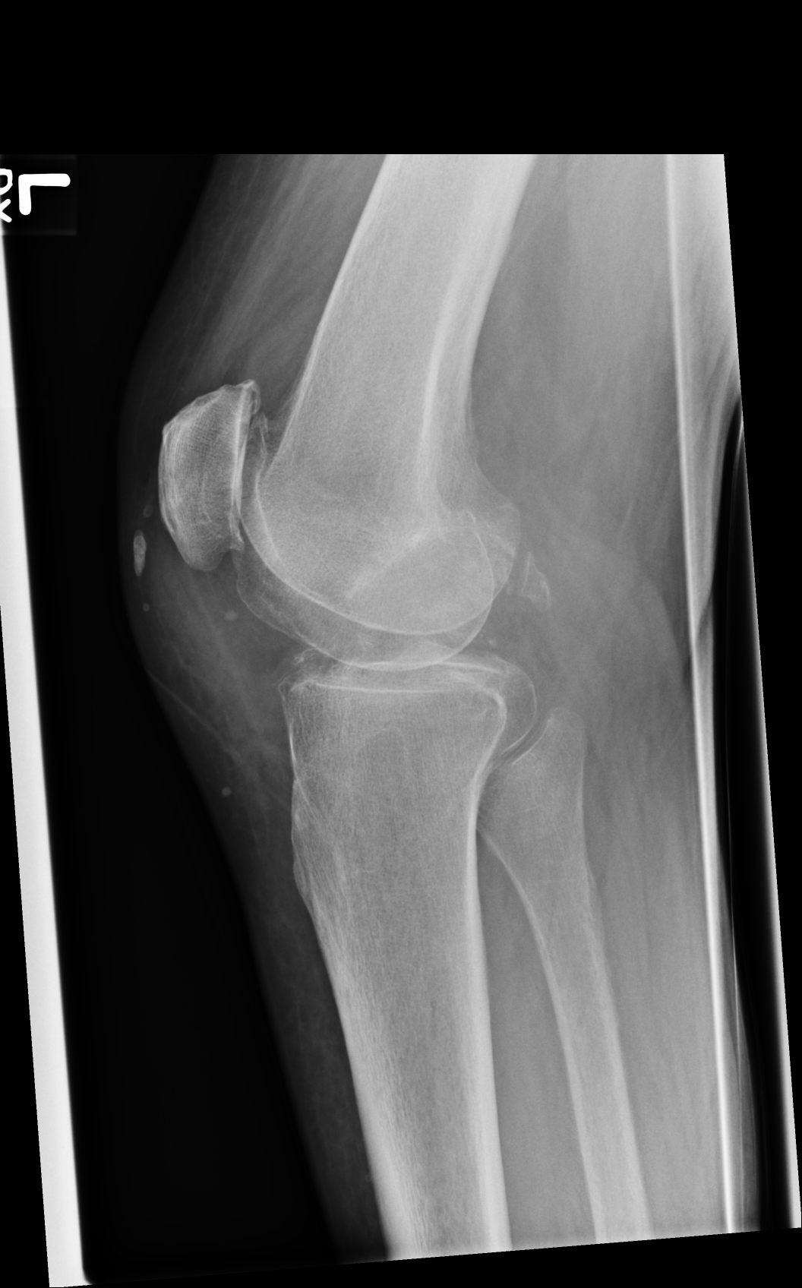

[4 of 4 positions shown; findings below may reference images not displayed]

FINDINGS: 4 views of the left knee demonstrates no acute fracture or dislocation.
There is no significant joint effusion. There is chondrocalcinosis of the
medial and lateral tibiofemoral compartment as can be seen with CPPD. There
tricompartmental marginal osteophytes.
IMPRESSION: No acute osseous injury of the left knee.

[REDACTED]

## 2012-04-04 IMAGING — CT CT HEAD WITHOUT CONTRAST
1 series · 16 of 30 positions shown, 20 images · non-contrast
Comparison: none

REASON FOR EXAM: HEAD INJURY , AGE GR THAN 67
COMMENTS:

PROCEDURE:     CT  - CT HEAD WITHOUT CONTRAST  - [DATE]  [DATE]
RESULT:     Comparison:  None
TECHNIQUE: Multiple axial images from the foramen magnum to the vertex were
obtained without IV contrast.

[Series 2: soft tissue · axial · 0.41mm/px · z∈[-221,-81]mm · 16 of 32 slices shown, 20 images]
[im 2/32  brain]
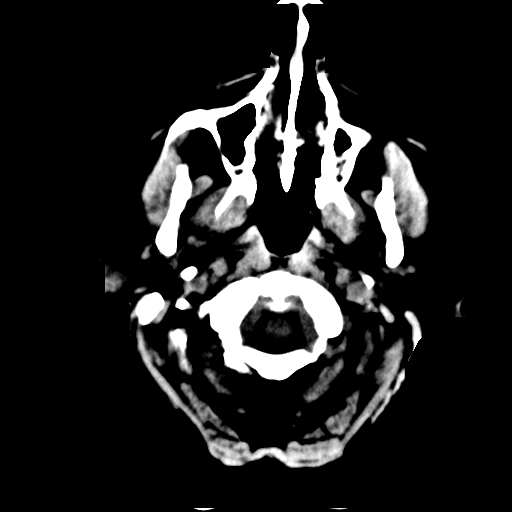
[im 2/32  bone]
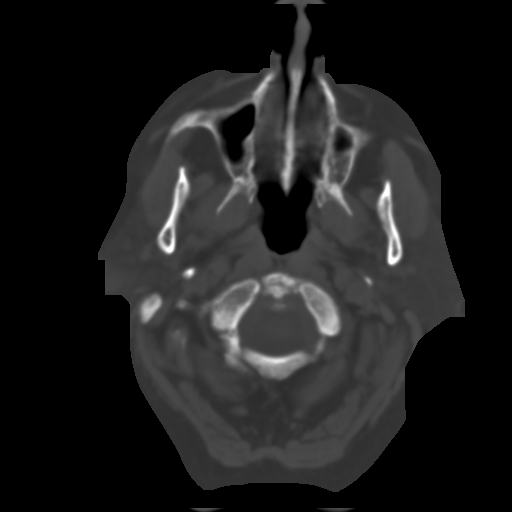
[im 4/32  brain]
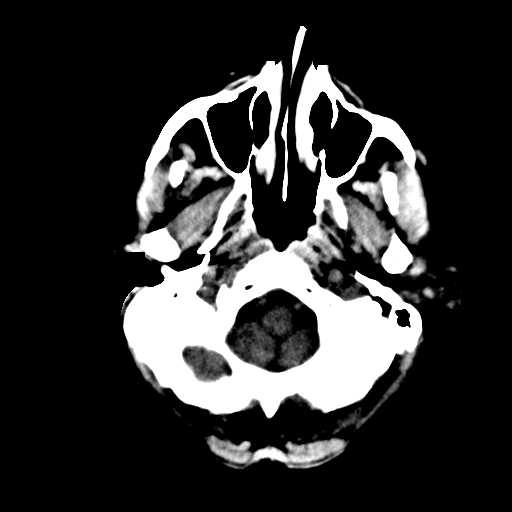
[im 6/32  brain]
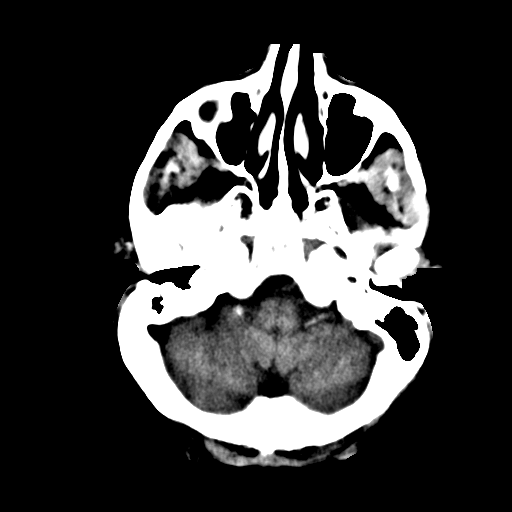
[im 8/32  brain]
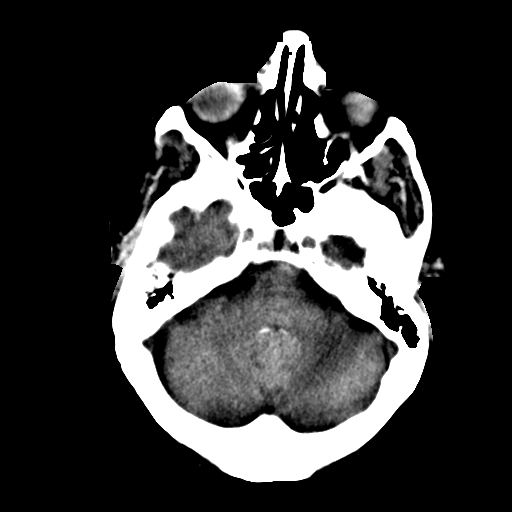
[im 9/32  brain]
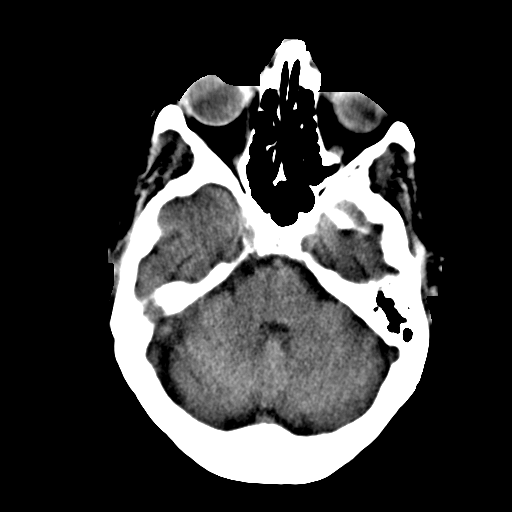
[im 9/32  bone]
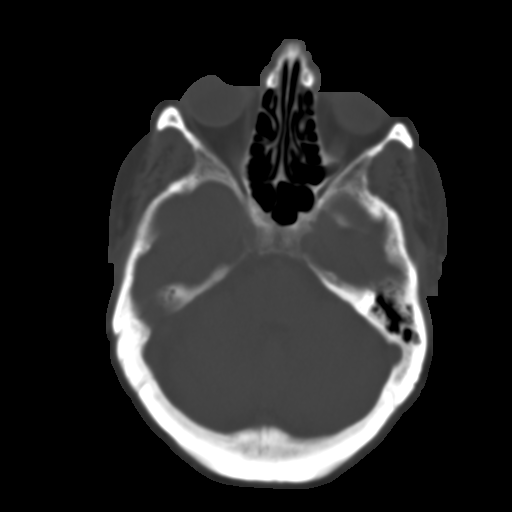
[im 11/32  brain]
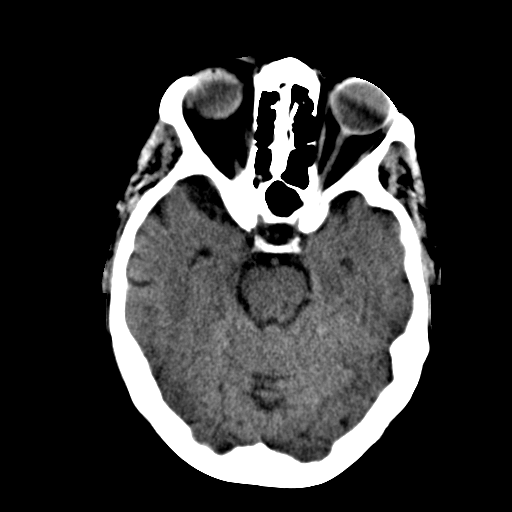
[im 13/32  brain]
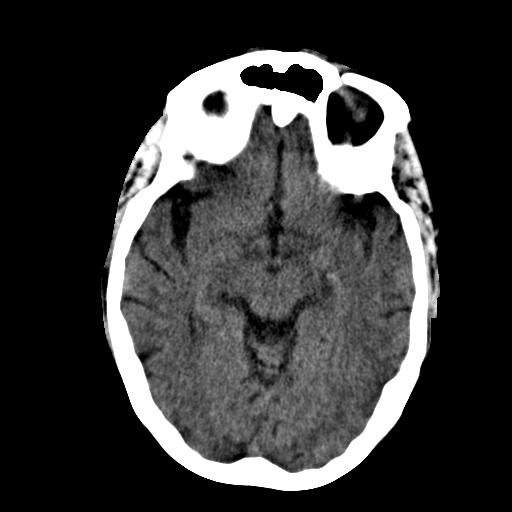
[im 15/32  brain]
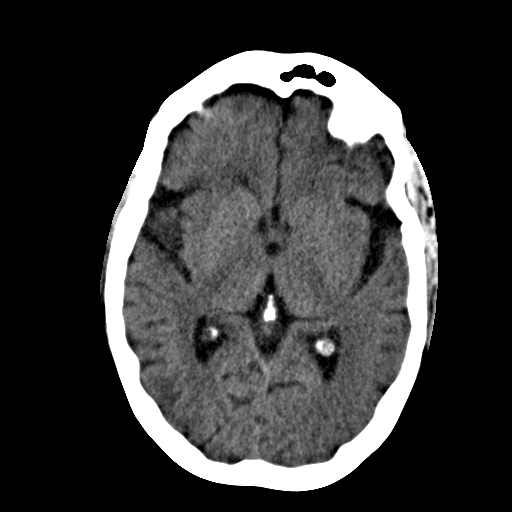
[im 17/32  brain]
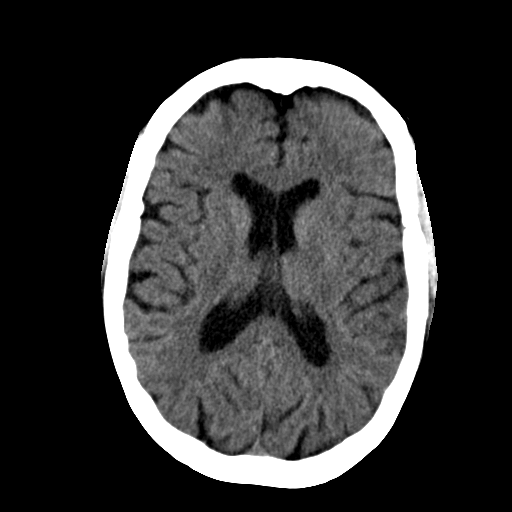
[im 17/32  bone]
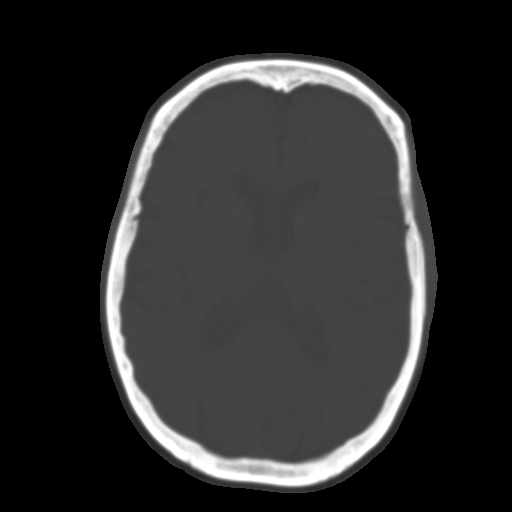
[im 19/32  brain]
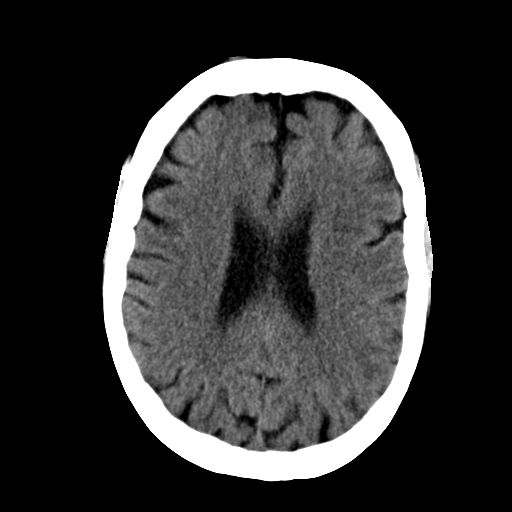
[im 21/32  brain]
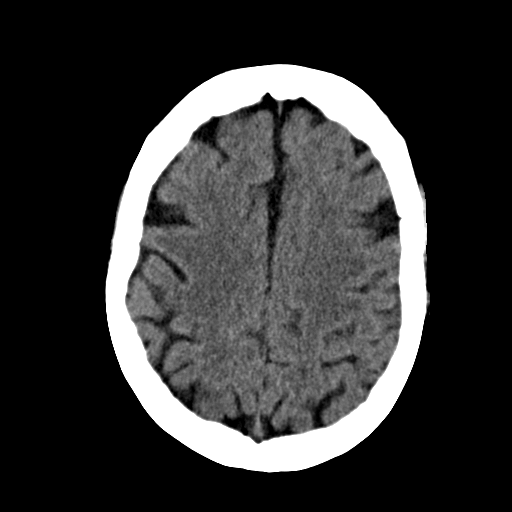
[im 23/32  brain]
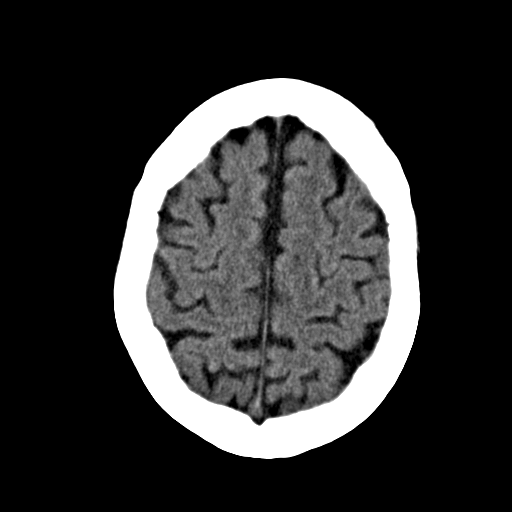
[im 24/32  brain]
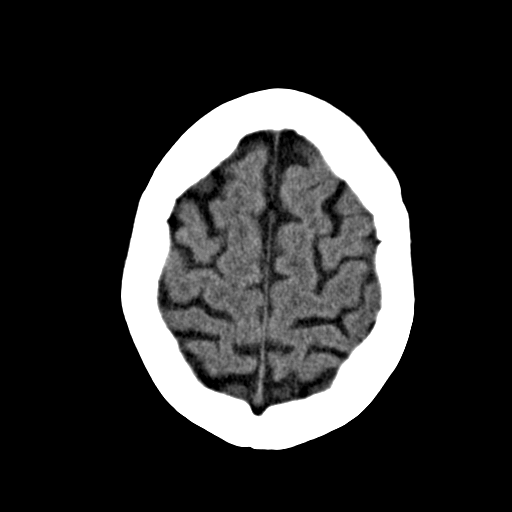
[im 24/32  bone]
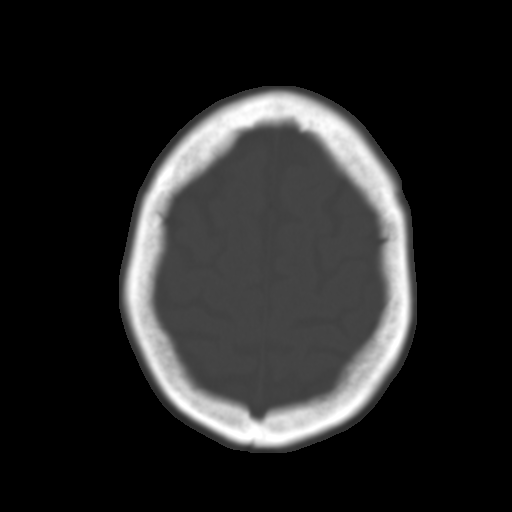
[im 26/32  brain]
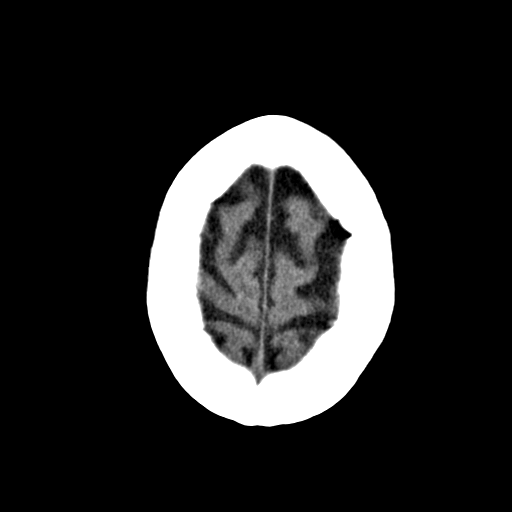
[im 28/32  brain]
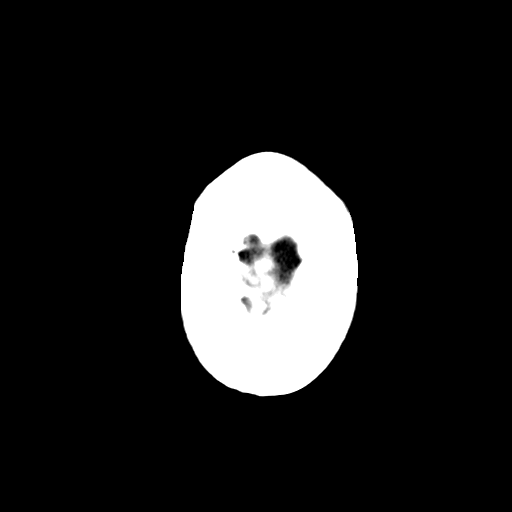
[im 30/32  brain]
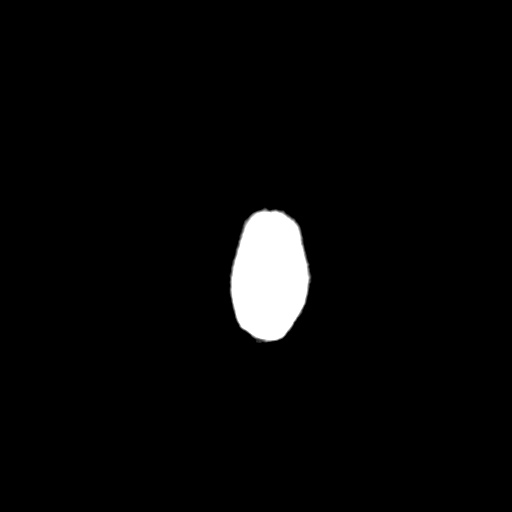

[16 of 30 positions shown; findings below may reference images not displayed]

FINDINGS: There is no evidence of mass effect, midline shift, or extra-axial fluid
collections.  There is no evidence of a space-occupying lesion or
intracranial hemorrhage. There is no evidence of a cortical-based area of
acute infarction. There is generalized cerebral atrophy.

The ventricles and sulci are appropriate for the patient's age. The basal
cisterns are patent.

Visualized portions of the orbits are unremarkable. The visualized portions
of the paranasal sinuses and mastoid air cells are unremarkable.

The osseous structures are unremarkable.
IMPRESSION: No acute intracranial process.

[REDACTED]

## 2012-04-04 IMAGING — CR DG SHOULDER 3+V*R*
1 series · 3 of 3 positions shown · non-contrast
Comparison: None

REASON FOR EXAM: RIGHT SHOULDER, UPPER HUMERUS PAIN AFTER FALL
COMMENTS:

PROCEDURE:     DXR - DXR SHOULDER RIGHT COMPLETE  - [DATE]  [DATE]
RESULT:     History: Fall

[Series 1: internal rotate · 0.17mm/px · 3 of 3 slices shown]
[im 1/3]
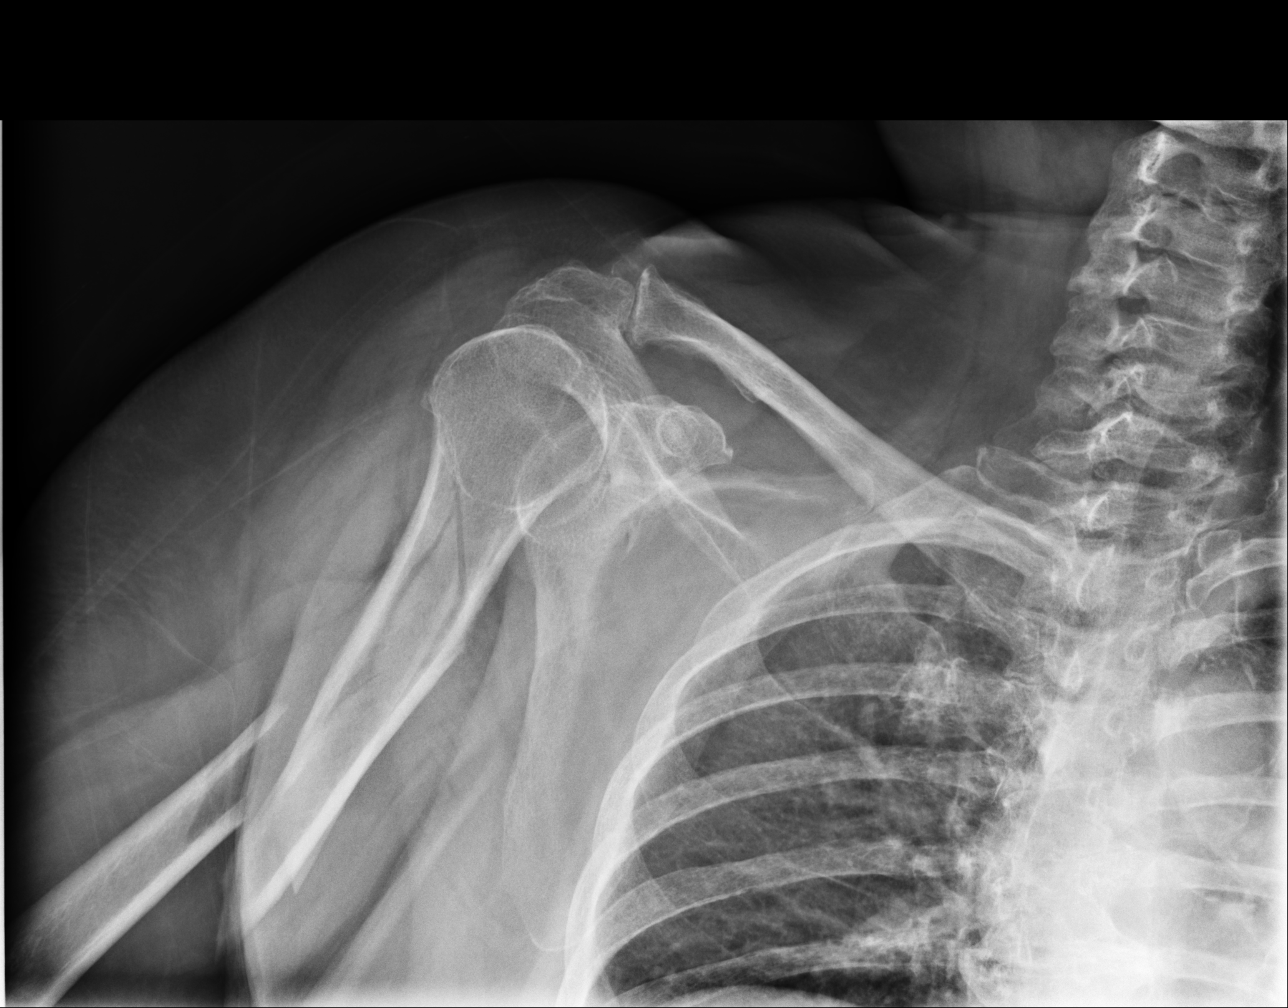
[im 2/3]
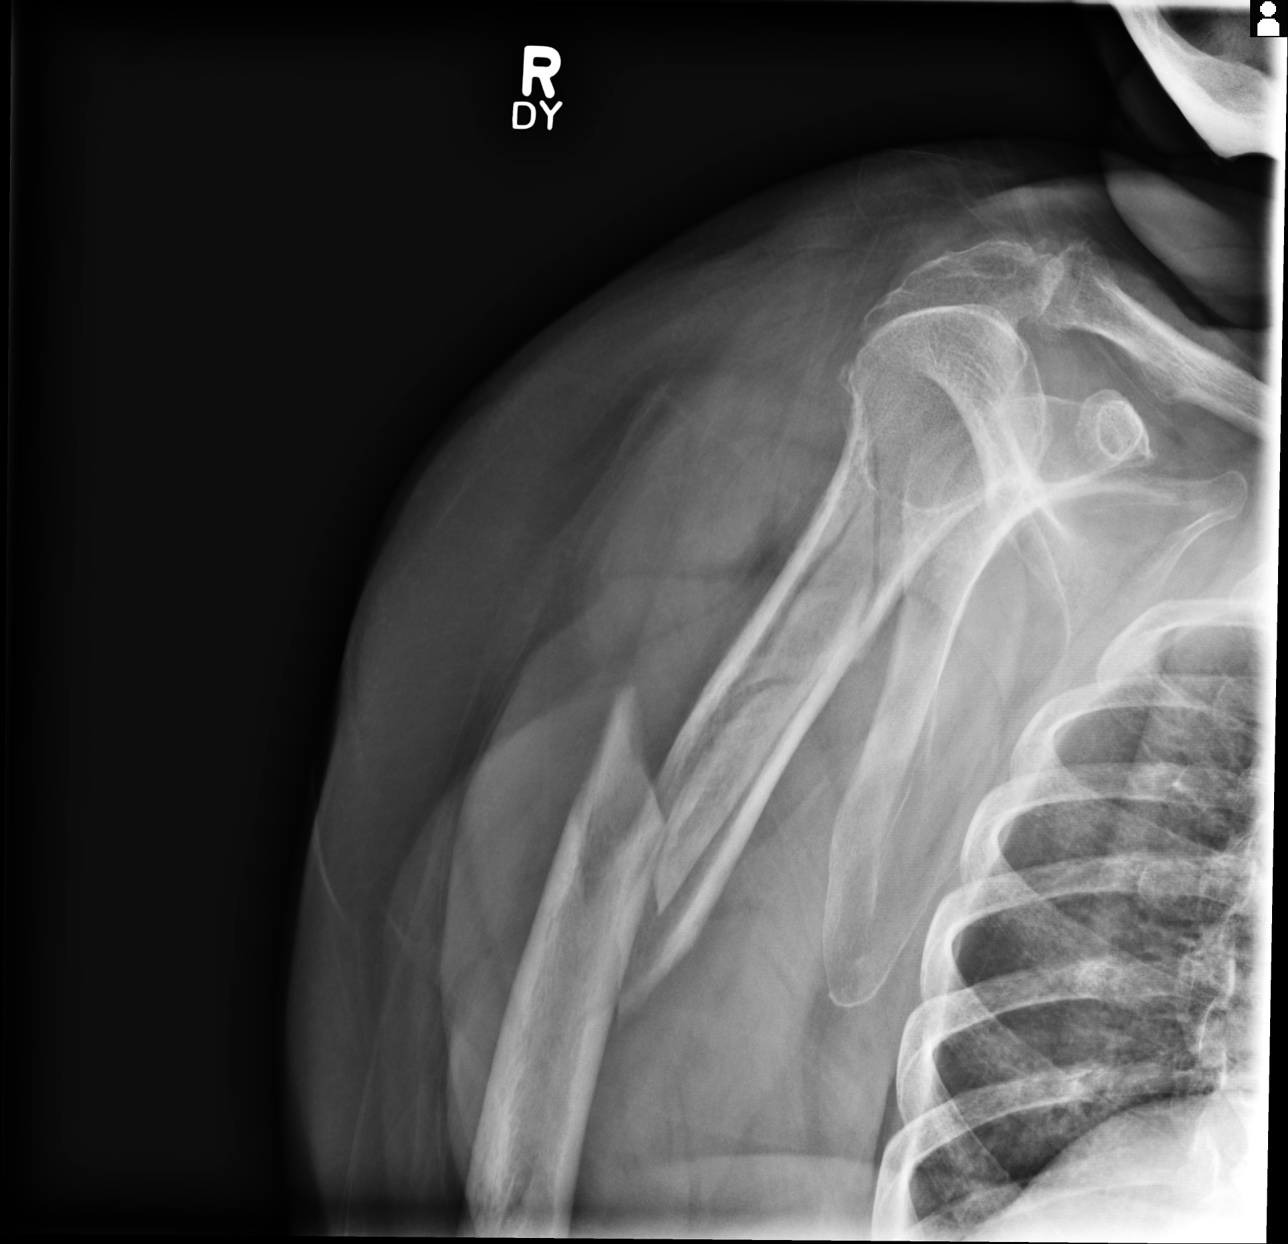
[im 3/3]
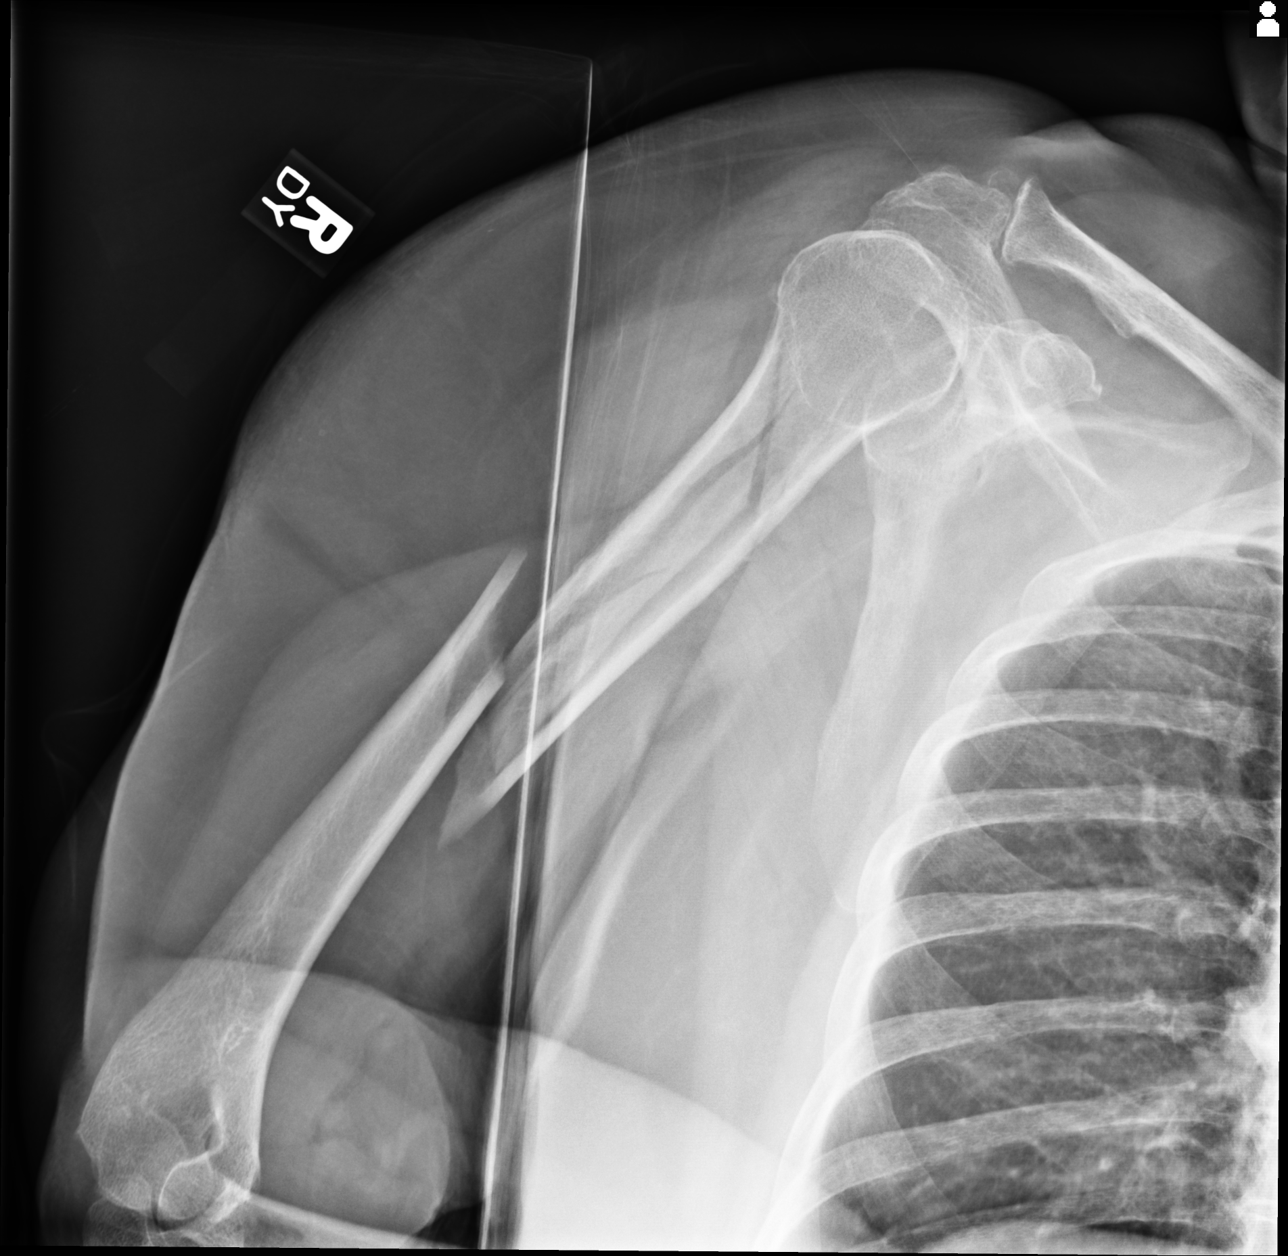

[3 of 3 positions shown; findings below may reference images not displayed]

FINDINGS: 3 views of the right shoulder demonstrates a comminuted fracture of the
proximal right humeral diaphysis with fracture cleft extending to the level
of the surgical neck. There is 2 cm of lateral displacement. There is mild
apex medial angulation. There are degenerative changes of the
acromioclavicular joint.
IMPRESSION: Please see above.

[REDACTED]

## 2012-04-30 ENCOUNTER — Encounter: Payer: Self-pay | Admitting: Cardiothoracic Surgery

## 2012-05-07 ENCOUNTER — Encounter: Payer: Self-pay | Admitting: Nurse Practitioner

## 2012-05-15 ENCOUNTER — Encounter: Payer: Self-pay | Admitting: Nurse Practitioner

## 2012-05-15 ENCOUNTER — Encounter: Payer: Self-pay | Admitting: Cardiothoracic Surgery

## 2012-06-12 ENCOUNTER — Encounter: Payer: Self-pay | Admitting: Cardiothoracic Surgery

## 2012-06-12 ENCOUNTER — Encounter: Payer: Self-pay | Admitting: Nurse Practitioner

## 2012-08-12 ENCOUNTER — Ambulatory Visit: Payer: Self-pay | Admitting: Family Medicine

## 2012-11-24 ENCOUNTER — Ambulatory Visit: Payer: Medicare Other | Admitting: Cardiovascular Disease

## 2012-12-15 ENCOUNTER — Encounter: Payer: Self-pay | Admitting: Cardiovascular Disease

## 2012-12-15 ENCOUNTER — Ambulatory Visit (INDEPENDENT_AMBULATORY_CARE_PROVIDER_SITE_OTHER): Payer: Medicare Other | Admitting: Cardiovascular Disease

## 2012-12-15 VITALS — BP 132/84 | HR 74 | Ht 63.0 in | Wt 199.2 lb

## 2012-12-15 DIAGNOSIS — I10 Essential (primary) hypertension: Secondary | ICD-10-CM

## 2012-12-15 DIAGNOSIS — E785 Hyperlipidemia, unspecified: Secondary | ICD-10-CM

## 2012-12-15 DIAGNOSIS — J40 Bronchitis, not specified as acute or chronic: Secondary | ICD-10-CM

## 2012-12-15 DIAGNOSIS — R0602 Shortness of breath: Secondary | ICD-10-CM

## 2012-12-15 DIAGNOSIS — I2510 Atherosclerotic heart disease of native coronary artery without angina pectoris: Secondary | ICD-10-CM

## 2012-12-15 DIAGNOSIS — R42 Dizziness and giddiness: Secondary | ICD-10-CM

## 2012-12-15 DIAGNOSIS — E119 Type 2 diabetes mellitus without complications: Secondary | ICD-10-CM

## 2012-12-15 NOTE — Patient Instructions (Addendum)
You are doing well. No medication changes were made.  Please call us if you have new issues that need to be addressed before your next appt.  Your physician wants you to follow-up in: 12 months.  You will receive a reminder letter in the mail two months in advance. If you don't receive a letter, please call our office to schedule the follow-up appointment. 

## 2012-12-15 NOTE — Assessment & Plan Note (Signed)
Blood pressure is well controlled on today's visit. No changes made to the medications. 

## 2012-12-15 NOTE — Assessment & Plan Note (Signed)
Cholesterol is at goal on the current lipid regimen. No changes to the medications were made.  

## 2012-12-15 NOTE — Progress Notes (Signed)
Patient ID: Sarah Potts, female    DOB: 1932/07/18, 79 y.o.   MRN: [id number]  HPI Comments: Ms. Oganesyan is a very pleasant 79 year old woman with a history of coronary artery disease diagnosed by cardiac catheterization in 2006, diabetes, hypertension, hyperlipidemia who presents for routine followup  She was last seen 77 year ago. Since then she states that she has been doing well with no new cardiac issues. She reports that sometimes her sugars run high. She continues on her cholesterol medication which is provided by the "state" at an affordable Price.  She does report developing a cough 2 weeks ago while she was in [place]. She was started on amoxicillin. Given cough medication here by Dr. Kary Kos. Symptoms have started to improve significantly but she still has a low-grade cough. No significant sputum production  She reports her balance is poor. Legs are giving out. She does not do any regular exercise.  She did have a fall in July 2013 and fell on her knees.   Cardiac catheterization in 2006 showed 30 and 50% proximal LAD disease, 30% mid LAD disease, 25% diagonal disease, 25% circumflex disease, 40% RCA disease.  Stress test in 2009 she exercised for only 3 minutes achieved 2.3 minutes, did not achieve adequate heart rate no new 81% of her predicted heart rate achieved. No significant ischemia with normal ejection fraction.  Echocardiogram performed in June 2008 showed normal LV systolic function with mild MR and TR.  EKG today shows normal sinus rhythm with rate 74 beats per minute with no significant ST or T wave changes, rare PVC  Outpatient Encounter Prescriptions as of 12/15/2012  Medication Sig Dispense Refill  . amoxicillin (AMOXIL) 875 MG tablet Take 875 mg by mouth 2 (two) times daily.       Marland Kitchen aspirin 325 MG tablet Take 325 mg by mouth daily.        Marland Kitchen atenolol (TENORMIN) 50 MG tablet Take 50 mg by mouth 2 (two) times daily.        . cetirizine (ZYRTEC) 10 MG chewable tablet  Chew 10 mg by mouth daily.        . Cholecalciferol (VITAMIN D3) 50000 UNITS CAPS Take 50,000 Units by mouth once a week.        . citalopram (CELEXA) 10 MG tablet Take 10 mg by mouth daily.        . Cyanocobalamin (VITAMIN B-12) 2500 MCG SUBL Place 2,500 mcg under the tongue once.        . ezetimibe-simvastatin (VYTORIN) 10-40 MG per tablet Take 1 tablet by mouth at bedtime.        . gabapentin (NEURONTIN) 100 MG capsule Take 100 mg by mouth 3 (three) times daily.        Marland Kitchen glipiZIDE (GLUCOTROL) 5 MG tablet Take one tablet every am and 1/2 tablet in the pm       . losartan (COZAAR) 100 MG tablet Take 100 mg by mouth daily.        . metFORMIN (GLUMETZA) 1000 MG (MOD) 24 hr tablet Take 1,000 mg by mouth 2 (two) times daily with a meal.        . montelukast (SINGULAIR) 10 MG tablet Take 10 mg by mouth at bedtime.        Marland Kitchen NITROSTAT 0.4 MG SL tablet Place 0.4 mg under the tongue every 5 (five) minutes as needed.       Marland Kitchen omega-3 acid ethyl esters (LOVAZA) 1 G capsule Take 2 g  by mouth 2 (two) times daily.        Marland Kitchen omeprazole (PRILOSEC) 40 MG capsule Take 40 mg by mouth daily.        Marland Kitchen PROAIR HFA 108 (90 BASE) MCG/ACT inhaler         Review of Systems  Constitutional: Negative.   HENT: Negative.   Eyes: Negative.   Respiratory: Positive for cough.   Cardiovascular: Negative.   Gastrointestinal: Negative.   Musculoskeletal: Positive for gait problem.  Skin: Negative.   Neurological: Negative.   Psychiatric/Behavioral: Negative.   All other systems reviewed and are negative.    BP 132/84  Pulse 74  Ht 5\' 3"  (1.6 m)  Wt 199 lb 4 oz (90.379 kg)  BMI 35.3 kg/m2  Physical Exam  Nursing note and vitals reviewed. Constitutional: She is oriented to person, place, and time. She appears well-developed and well-nourished.  obese  HENT:  Head: Normocephalic.  Nose: Nose normal.  Mouth/Throat: Oropharynx is clear and moist.  Eyes: Conjunctivae are normal. Pupils are equal, round, and  reactive to light.  Neck: Normal range of motion. Neck supple. No JVD present.  Cardiovascular: Normal rate, regular rhythm, S1 normal, S2 normal, normal heart sounds and intact distal pulses.  Exam reveals no gallop and no friction rub.   No murmur heard. Pulmonary/Chest: Effort normal and breath sounds normal. No respiratory distress. She has no wheezes. She has no rales. She exhibits no tenderness.  Abdominal: Soft. Bowel sounds are normal. She exhibits no distension. There is no tenderness.  Musculoskeletal: Normal range of motion. She exhibits no edema and no tenderness.  Lymphadenopathy:    She has no cervical adenopathy.  Neurological: She is alert and oriented to person, place, and time. Coordination normal.  Skin: Skin is warm and dry. No rash noted. No erythema.  Psychiatric: She has a normal mood and affect. Her behavior is normal. Judgment and thought content normal.   Assessment and Plan

## 2012-12-15 NOTE — Assessment & Plan Note (Signed)
We have encouraged continued exercise, careful diet management in an effort to lose weight. 

## 2012-12-15 NOTE — Assessment & Plan Note (Signed)
Bronchitis seems to be resolving. She has completed her amoxicillin except for one day. Still has a cough, using her cough syrup. Encouraged her to followup with Dr. Kary Kos if symptoms get worse again with productive sputum.

## 2012-12-15 NOTE — Assessment & Plan Note (Signed)
Currently with no symptoms of angina. No further workup at this time. Continue current medication regimen. 

## 2013-03-02 ENCOUNTER — Ambulatory Visit: Payer: Self-pay | Admitting: Family Medicine

## 2013-03-14 ENCOUNTER — Ambulatory Visit: Payer: Self-pay | Admitting: Family Medicine

## 2013-05-18 ENCOUNTER — Ambulatory Visit: Payer: Self-pay | Admitting: Family Medicine

## 2013-06-12 ENCOUNTER — Ambulatory Visit: Payer: Self-pay | Admitting: Family Medicine

## 2013-06-30 ENCOUNTER — Observation Stay: Payer: Self-pay | Admitting: Specialist

## 2013-06-30 DIAGNOSIS — R079 Chest pain, unspecified: Secondary | ICD-10-CM

## 2013-06-30 DIAGNOSIS — E785 Hyperlipidemia, unspecified: Secondary | ICD-10-CM

## 2013-06-30 DIAGNOSIS — I10 Essential (primary) hypertension: Secondary | ICD-10-CM

## 2013-06-30 DIAGNOSIS — I517 Cardiomegaly: Secondary | ICD-10-CM

## 2013-06-30 LAB — CBC
HCT: 35.6 % (ref 35.0–47.0)
HGB: 11.3 g/dL — ABNORMAL LOW (ref 12.0–16.0)
MCH: 26 pg (ref 26.0–34.0)
MCHC: 31.8 g/dL — AB (ref 32.0–36.0)
MCV: 82 fL (ref 80–100)
Platelet: 256 10*3/uL (ref 150–440)
RBC: 4.35 10*6/uL (ref 3.80–5.20)
RDW: 16.9 % — ABNORMAL HIGH (ref 11.5–14.5)
WBC: 11.7 10*3/uL — ABNORMAL HIGH (ref 3.6–11.0)

## 2013-06-30 LAB — BASIC METABOLIC PANEL
Anion Gap: 16 (ref 7–16)
BUN: 24 mg/dL — ABNORMAL HIGH (ref 7–18)
CHLORIDE: 107 mmol/L (ref 98–107)
CO2: 24 mmol/L (ref 21–32)
Calcium, Total: 8.9 mg/dL (ref 8.5–10.1)
Creatinine: 0.94 mg/dL (ref 0.60–1.30)
EGFR (African American): >60
EGFR (Non-African Amer.): 58 — ABNORMAL LOW
GLUCOSE: 149 mg/dL — AB (ref 65–99)
Osmolality: 299 (ref 275–301)
Potassium: 4.8 mmol/L (ref 3.5–5.1)
Sodium: 147 mmol/L — ABNORMAL HIGH (ref 136–145)

## 2013-06-30 LAB — TROPONIN I: Troponin-I: <0.02 ng/mL

## 2013-06-30 LAB — D-DIMER(ARMC): D-DIMER: 698 ng/mL

## 2013-06-30 LAB — CK TOTAL AND CKMB (NOT AT ARMC)
CK, TOTAL: 45 U/L
CK, Total: 51 U/L
CK-MB: 1 ng/mL (ref 0.5–3.6)
CK-MB: 1 ng/mL (ref 0.5–3.6)

## 2013-06-30 IMAGING — CR DG CHEST 1V PORT
1 series · 1 of 1 positions shown · non-contrast
Comparison: None.

CLINICAL DATA: Chest pain.

EXAM:
PORTABLE CHEST - 1 VIEW

[ap]
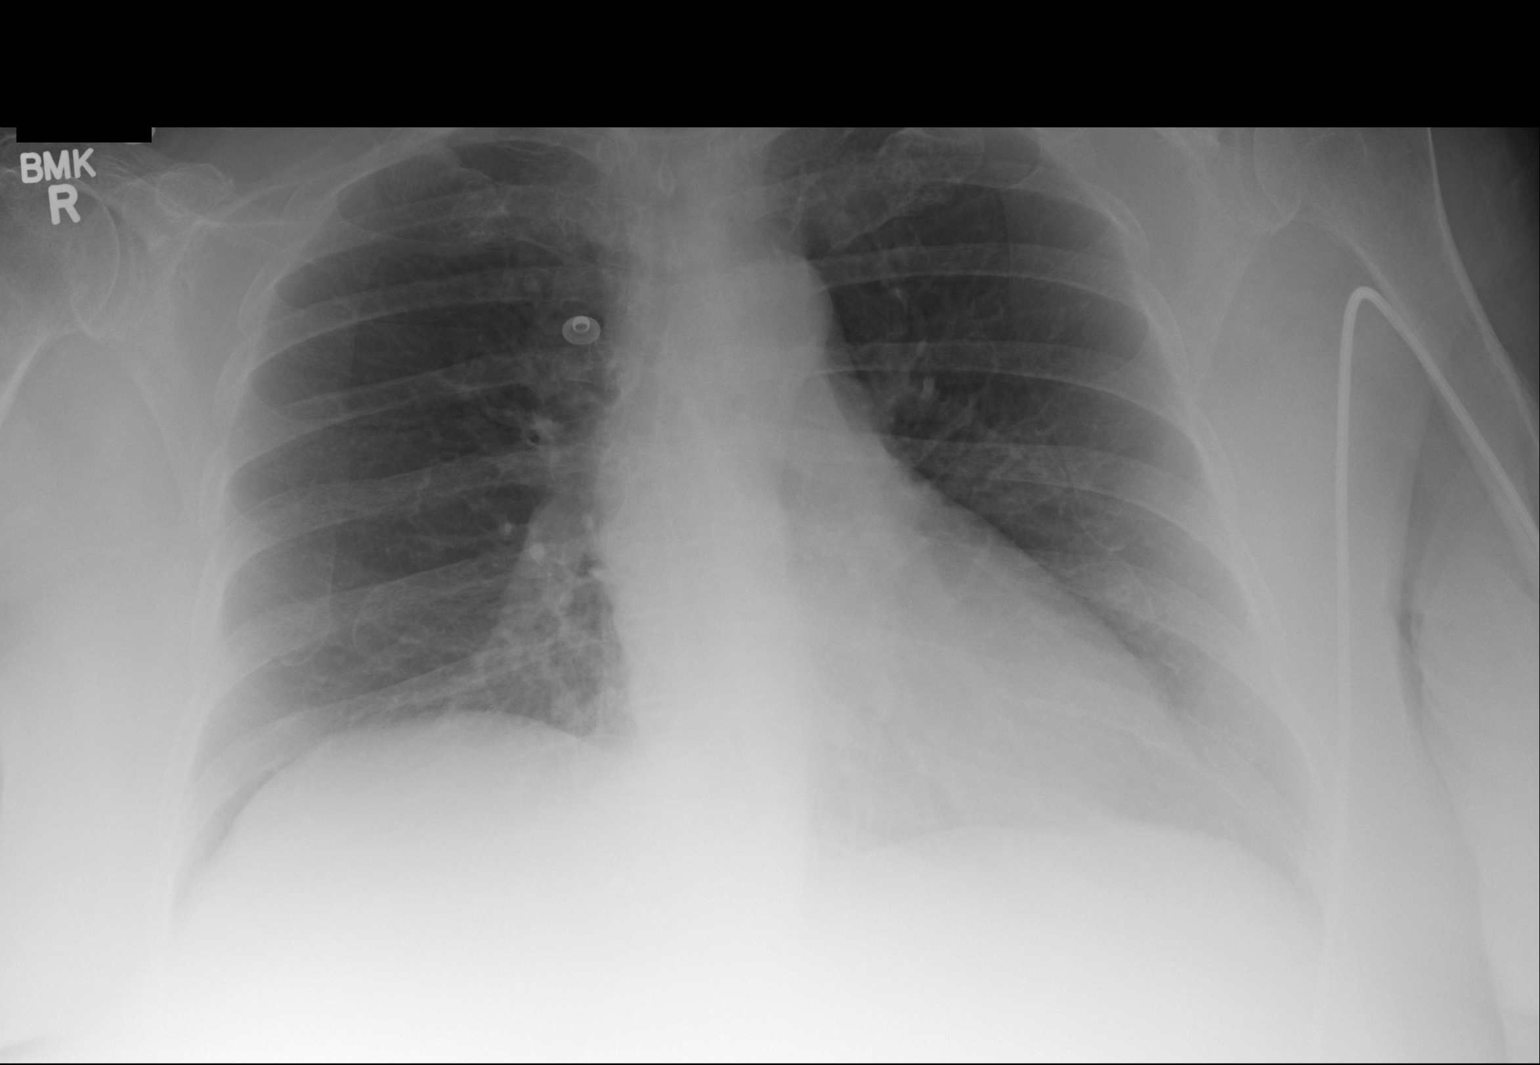

[1 of 1 positions shown; findings below may reference images not displayed]

FINDINGS: Lungs clear. Heart size normal. No pneumothorax or pleural effusion.
IMPRESSION: Negative chest.

## 2014-01-02 DIAGNOSIS — R197 Diarrhea, unspecified: Secondary | ICD-10-CM | POA: Insufficient documentation

## 2014-01-02 DIAGNOSIS — D509 Iron deficiency anemia, unspecified: Secondary | ICD-10-CM | POA: Insufficient documentation

## 2014-01-09 ENCOUNTER — Other Ambulatory Visit: Payer: Self-pay | Admitting: Unknown Physician Specialty

## 2014-01-09 LAB — CLOSTRIDIUM DIFFICILE(ARMC)

## 2014-01-12 LAB — STOOL CULTURE

## 2014-02-20 ENCOUNTER — Ambulatory Visit: Payer: Self-pay | Admitting: Unknown Physician Specialty

## 2014-08-05 NOTE — H&P (Signed)
PATIENT NAME:  Sarah Potts, Sarah Potts MR#:  [id number] DATE OF BIRTH:  Jan 22, 1933  DATE OF ADMISSION:  06/30/2013  PRIMARY CARE PHYSICIAN:  Dr. Jarome Lamas.   REFERRING PHYSICIAN:  Dr. Owens Shark.  PRIMARY CARDIOLOGIST:  Dr. Rockey Situ.   CHIEF COMPLAINT:  Chest pain.   HISTORY OF PRESENT ILLNESS:  The patient is a 81 year old pleasant Caucasian female with past medical history of coronary artery disease, hypertension, hyperlipidemia, diabetes mellitus and COPD is presenting to the ER with a chief complaint of left-sided chest pain radiating to the left shoulder.  The patient reports that the pain is crampy in nature with radiation to the left side of the shoulder and started at around 9:00 to 9:30 p.m. last night.  The patient was resting at that time.  The patient took aspirin and subsequently the pain was improved.  This is associated with shortness of breath, but denies any nausea, vomiting, dizziness or loss of consciousness.  It hurts when she takes a deep breath.  The patient came to the ER at around 1:15 a.m.  During my examination, the patient is resting comfortably.  The patient is reporting that she had a cardiac cath approximately 20 years ago and at that time she was told that she has 40%, 50% and 60% blockages.  She never had any stent placement or repeat cardiac cath done after that.  Denies any other complaints.  Denies any dizziness, loss of consciousness or diaphoresis.   PAST MEDICAL HISTORY:  Coronary artery disease, hypertension, diabetes mellitus, COPD, hyperlipidemia.   PAST SURGICAL HISTORY:  Cardiac catheterization.   ALLERGIES:  SHE IS ALLERGIC TO LEVAQUIN.   PSYCHOSOCIAL HISTORY:  Lives at home.  Denies any history of smoking, alcohol or illicit drug usage.   FAMILY HISTORY:  Hypertension and diabetes runs in her family.   HOME MEDICATIONS:  Vytorin 10/40 1 tablet by mouth once a day, vitamin B12 2500 mcg once daily, Singulair 10 mg by mouth once daily, Pro Air 2 puffs inhalation q.  4 hours as needed basis,  Nitrocet 0.4 mg sublingually every five minutes x 3 as needed basis, metformin 1000 mg 2 times a day, Lovaza 2 capsules by mouth 2 times a day, losartan 100 mg by mouth once daily, glipizide 5 mg by mouth once a day, gabapentin 100 mg 2 capsules orally 3 times a day, Ecotrin 325 mg by mouth once daily, citalopram 10 mg by mouth once daily,  atenolol 50 mg by mouth 2 times a day.   REVIEW OF SYSTEMS: CONSTITUTIONAL:  Denies any fever, fatigue.  EYES:  Denies blurry vision, double vision.  EARS, NOSE, THROAT:  Denies epistaxis, discharge.  Hard of hearing.  RESPIRATION:  Denies cough.  Has chronic history of COPD.  CARDIOVASCULAR:  Has reproducible left-sided chest pain with one tender spot.  Denies any palpitations.  GASTROINTESTINAL:  Denies nausea, vomiting, diarrhea.  Denies any abdominal pain.  GENITOURINARY:  No dysuria, hematuria.  GYNECOLOGIC AND BREAST:  Denies breast mass or vaginal discharge.  ENDOCRINE:  Denies polyuria, nocturia.  Has chronic history of diabetes mellitus.  HEMATOLOGIC AND LYMPHATIC:  No anemia, easy bruising, bleeding.  INTEGUMENTARY:  No acne, rash, lesions.  MUSCULOSKELETAL:  No joint pain in the neck.  Denies gout.  NEUROLOGIC:  Denies vertigo, ataxia, dementia.  PSYCHIATRIC:  Denies any ADD, OCD, anxiety.   PHYSICAL EXAMINATION:  VITAL SIGNS:  Temperature 98 degrees Fahrenheit, pulse 80, respirations 18, blood pressure 142/65, pulse ox 97%. GENERAL APPEARANCE:  Not under acute  distress.  Moderately built and nourished.  HEENT:  Normocephalic, atraumatic.  Pupils are equally reacting to light and accommodation.  No scleral icterus.  No conjunctival injection.  No sinus tenderness.  No postnasal drip.  NECK:  Supple.  No JVD.  No thyromegaly.  Range of motion is intact.  LUNGS:  Clear to auscultation bilaterally.  No accessory muscle usage.  Positive anterior chest wall tenderness on the left side of the chest.  Tender in  particular  supramammary area with no radiation.  Complaining of left shoulder pain.  CARDIAC:  S1, S2 normal.  Regular rate and rhythm.  No murmur.  GASTROINTESTINAL:  Soft, obese.  Bowel sounds are positive in all four quadrants.  Nontender, nondistended.  No hepatosplenomegaly.  No masses felt.  NEUROLOGIC:  Awake, alert, oriented x 3.  Motor and sensory grossly intact.  Reflexes are 2+.  EXTREMITIES:  No edema.  No cyanosis.  No clubbing.  SKIN:  Warm to touch.  Normal turgor.  No rashes.  No lesions.  MUSCULOSKELETAL:  No joint effusion, tenderness, erythema.   PSYCHOLOGIC:  Normal mood and affect.   LABORATORY AND IMAGING STUDIES:  Troponin less than 0.02.  WBC 11.7, hemoglobin 11.3, hematocrit 35.6, platelets 256, glucose 149, BUN 24, creatinine 0.94, sodium 147, potassium 4.8, chloride 107, CO2 24.  GFR greater than 60, serum osmolality 299, calcium 8.9.  D-dimer 698.  Portable chest x-ray, negative chest.  A 12-lead EKG:  Normal sinus rhythm at 85 beats per minute, normal PR and QRS interval.  No acute ST-T wave changes.   ASSESSMENT AND PLAN:  A 81 year old Caucasian female presenting to the ER with a chief complaint of supramammary left-sided chest pain started at around 9:30 p.m. last night.  1.  Atypical chest pain, probably costochondritis as the pain is reproducible.  Given the patient's risk factors we will rule out acute coronary syndrome.  Admit her to telemetry, acute coronary syndrome protocol with oxygen, nitroglycerin, aspirin, beta blocker after stress test, statin and cycle cardiac biomarkers.  Stress test is ordered.  Consult is placed to Dr. Rockey Situ.  2.  Diabetes mellitus.  The patient will be on sliding scale insulin, being nothing by mouth we will hold off on her home medication.  3.  Hypertension.  Resume home medications except atenolol which will be resumed after stress test.  4.  Chronic obstructive pulmonary disease, not under acute exacerbation.  Continue her home medication  Singulair and provide nebulizer treatments as-needed basis.  5.  Coronary artery disease.  The patient is on aspirin, beta blocker will be resumed after stress test, on statin.  6.  Hyperlipidemia.  Continue statin.  7.  We will provide gastrointestinal and deep vein thrombosis prophylaxis.  8.  CODE STATUS:  SHE IS A FULL CODE.  Son is the medical power of attorney.   Plan of care discussed in detail with the patient.  She is aware of the plan.    Total time spent on admission is 45 minutes.     ____________________________ Nicholes Mango, MD ag:ea D: 06/30/2013 05:34:17 ET T: 06/30/2013 06:01:21 ET JOB#: [id number]  cc: Nicholes Mango, MD, <Dictator> Minna Merritts, MD Irven Easterly. Kary Kos, MD  Nicholes Mango MD ELECTRONICALLY SIGNED 07/16/2013 21:31

## 2014-08-05 NOTE — Discharge Summary (Signed)
PATIENT NAME:  Sarah Potts, Sarah Potts MR#:  [id number] DATE OF BIRTH:  07-30-1932  DATE OF ADMISSION:  06/30/2013 DATE OF DISCHARGE:  06/30/2013  For a detailed note, please take a look at the history and physical done on admission by Dr. Margaretmary Eddy.    DIAGNOSES AT DISCHARGE: As follows:  1. Chest pain, likely musculoskeletal in nature.  2. Hypertension.  3. History of coronary artery disease.  4. Hyperlipidemia.  5. Diabetes.  6. Diabetic neuropathy.   DIET: The patient is being discharged on a low-sodium, low-fat, American Diabetic Association diet.   ACTIVITY: As tolerated.   FOLLOWUP: With Dr. Jarome Lamas in the next 1 to 2 weeks.   DISCHARGE MEDICATIONS: 1. Lovaza 1000 mg capsule 2 caps b.i.d. 2. Metformin 1000 mg b.i.d. 3. Omeprazole 40 mg daily. 4. Losartan 100 mg daily.  5. Gabapentin 100 mg 2 caps t.i.d. 6. Ergocalciferol 50,000 international units weekly.  7. Glipizide 5 mg in the morning and 2.5 mg the evening.  8. Aspirin 325 mg daily. 9. Singulair 10 mg daily.  10. Vytorin 10/40 one tab daily.  11. Albuterol inhaler 2 puffs q.4 hours as needed.  12. Sublingual nitroglycerin as needed.  13. Cetirizine 10 mg daily. 14. Celexa 10 mg at bedtime. 15. Vitamin B12 2500 mcg daily.  16. Atenolol 50 mg b.i.d.   CONSULTANTS DURING THE HOSPITAL COURSE: Dr. Ida Rogue from cardiology.   PERTINENT STUDIES DONE DURING THE HOSPITAL COURSE: As follows: A chest x-ray done on admission showing no acute cardiopulmonary disease. A nuclear medicine myocardial scan done on March 19th showing pharmacological myocardial perfusion study with no significant ischemia. No significant wall motion abnormality. EF of 64%. No EKG changes concerning for ischemia.   BRIEF HOSPITAL COURSE: This is a 81 year old female, with medical problems as mentioned above, who presented to the hospital with chest pain.   1. Chest pain. Given the patient's previous history of coronary disease, hypertension,  hyperlipidemia, the patient was observed overnight on telemetry, had 3 sets of cardiac markers checked, which were negative. The patient underwent a nuclear medicine myocardial scan which showed no EKG changes concerning for ischemia, no wall motion abnormalities with a normal ejection fraction. The patient was chest pain-free and hemodynamically stable and therefore discharged home.  2. Diabetes. The patient was maintained on her glipizide, and she will continue that.  3. Diabetic neuropathy. The patient was maintained on Neurontin. She will resume that.  4. Hyperlipidemia. The patient was maintained on her simvastatin. She will resume that.  5. Hypertension. The patient remained hemodynamically stable. She will continue her losartan and atenolol.  6. Depression. The patient was maintained on her Celexa, and she will resume that upon discharge.   CODE STATUS: The patient is a full code.   DISPOSITION: She is being discharged home, and the most likely cause of her chest pain was likely musculoskeletal in nature.    TIME SPENT: 35 minutes.   ____________________________ Belia Heman. Verdell Carmine, MD vjs:lb D: 07/01/2013 08:32:58 ET T: 07/01/2013 09:07:12 ET JOB#: [id number]  cc: Belia Heman. Verdell Carmine, MD, <Dictator> Irven Easterly. Kary Kos, MD Henreitta Leber MD ELECTRONICALLY SIGNED 07/08/2013 20:27

## 2014-08-05 NOTE — Consult Note (Signed)
General Aspect Sarah Potts is a 81yo Caucasian female w/ PMHx s/f nonobstructive CAD, DM2, HTN, HLD, obesity and premature family h/o CAD who has been observed at [facility] overnight for chest pain rule out. Cardiology consulted in this setting.   She had a cardiac catheterization in 2006 revealing 30% prox LAD, 50% mid LAD, 30% D1, 20% prox LCx, prox RCA 40%; EF 75%. She follows with Dr. Rockey Situ in the office. Last seen 12/2012. Stable from a cardiac standpoint. Denies exertional chest pain or dyspnea.   She reports being in her USOH until 9-9:30 PM when she suddenly experienced sharp, localized, L-sided chest pain radiating to her L shoulder. She denies associated diaphoresis, nausea and dyspnea. Aggravated by deep inspiration and palpation. No antecedent exertional chest discomfort or shortness of breath. She denies PND, LE edema, weight gain, abdominal distention, orthopnea. No extended travel or unilateral leg swelling. Given her prior cardiac history, he sought medical attention at [facility] ED.   Present Illness There, EKG revealed NSR, diffuse TW blunting, inferior IVCD, nonspecific ST scooping. Initial TnI WNL. CBC- WBC 11.7K, Hgb 11.3. BMP- Na 147, BUN 24, Cr 0.94. CXR- no active disease. ED BP: 188-191/90-97. VS otherwise stable.   She was observed by the medicine service overnight. Chest pain felt to be costochondritis. Echo and stress test order. A subsequent TnI returned WNL.   She was stable overnight w/ no acute events. Still with some discomfort this AM.   PAST MEDICAL HISTORY CAD, native artery Type 2 DM HTN HLD Obesity  PAST SURGICAL HISTORY Hysterectomy Bladder surgery Cardiac catheterization  FAMILY HISTORY Father with MI at 26, son with CAD and CABG at 76  [place] Denies tobacco or illicit drug use. Occasional EtOH use in the past.   ALLERGIES Levaquin- diarrhea   Physical Exam:  GEN no acute distress, obese   HEENT pink conjunctivae, PERRL, hearing intact to  voice   NECK supple  No masses  trachea midline  no JVD or bruits   RESP normal resp effort  clear BS  no use of accessory muscles   CARD Regular rate and rhythm  Normal, S1, S2  No murmur  focal L-sided chest wall tenderness to palpation   ABD denies tenderness  soft  normal BS   EXTR negative cyanosis/clubbing, negative edema   SKIN normal to palpation   NEURO follows commands, motor/sensory function intact   PSYCH alert, A+O to time, place, person   Review of Systems:  Subjective/Chief Complaint chest pain   Cardiovascular: Chest pain or discomfort   Review of Systems: All other systems were reviewed and found to be negative   Home Medications: Medication Instructions Status  Lovaza ethyl esters 1000 mg oral capsule 2 cap(s) orally 2 times a day Active  metFORMIN 1000 mg oral tablet 1 tab(s) orally 2 times a day Active  omeprazole 40 mg oral delayed release capsule 1 cap(s) orally once a day Active  losartan 100 mg oral tablet 1 tab(s) orally once a day Active  gabapentin 100 mg oral capsule 2 cap(s) orally 3 times a day Active  ergocalciferol 50,000 intl units oral capsule 1 cap(s) orally once a week Active  glipiZIDE 5 mg oral tablet 1 tab(s) orally once a day in the morning and 0.5 tab in the evening Active  Ecotrin 325 mg oral delayed release tablet 1 tab(s) orally once a day Active  Singulair 10 mg oral tablet 1 tab(s) orally once a day (in the evening) Active  Vytorin 10  mg-40 mg oral tablet 1 tab(s) orally once a day (in the evening) Active  ProAir HFA CFC free 90 mcg/inh inhalation aerosol 2 puff(s) inhaled every 4 hours, As Needed Active  Nitrostat 0.4 mg sublingual tablet 1 tab(s) sublingual every 5 minutes, As Needed - for Chest Pain Active  cetirizine 10 mg oral tablet 1 tab(s) orally once a day (in the evening) Active  atenolol 50 mg oral tablet 1 tab(s) orally 2 times a day Active  citalopram 10 mg oral tablet 1 tab(s) orally once a day (at bedtime) Active   Vitamin B-12 2500 microgram(s) orally  Active   Lab Results:  Routine Chem:  19-Mar-15 01:56   Glucose, Serum  149  BUN  24  Creatinine (comp) 0.94  Sodium, Serum  147  Potassium, Serum 4.8  Chloride, Serum 107  CO2, Serum 24  Calcium (Total), Serum 8.9  Anion Gap 16  Osmolality (calc) 299  eGFR (African American) >60  eGFR (Non-African American)  58 (eGFR values <65m/min/1.73 m2 may be an indication of chronic kidney disease (CKD). Calculated eGFR is useful in patients with stable renal function. The eGFR calculation will not be reliable in acutely ill patients when serum creatinine is changing rapidly. It is not useful in  patients on dialysis. The eGFR calculation may not be applicable to patients at the low and high extremes of body sizes, pregnant women, and vegetarians.)  Result Comment POTASSIUM - Slight hemolysis, interpret results with  - TPL  Result(s) reported on 30 Jun 2013 at 02:30AM.  Cardiac:  19-Mar-15 01:56   Troponin I < 0.02 (0.00-0.05 0.05 ng/mL or less: NEGATIVE  Repeat testing in 3-6 hrs  if clinically indicated. >0.05 ng/mL: POTENTIAL  MYOCARDIAL INJURY. Repeat  testing in 3-6 hrs if  clinically indicated. NOTE: An increase or decrease  of 30% or more on serial  testing suggests a  clinically important change)    05:40   Troponin I < 0.02 (0.00-0.05 0.05 ng/mL or less: NEGATIVE  Repeat testing in 3-6 hrs  if clinically indicated. >0.05 ng/mL: POTENTIAL  MYOCARDIAL INJURY. Repeat  testing in 3-6 hrs if  clinically indicated. NOTE: An increase or decrease  of 30% or more on serial  testing suggests a  clinically important change)  CK, Total 51 (26-192 NOTE: NEW REFERENCE RANGE  05/16/2013)  CPK-MB, Serum 1.0 (Result(s) reported on 30 Jun 2013 at 06:31AM.)  Routine Coag:  19-Mar-15 02:38   D-Dimer, Quantitative 698 (INTERPRETATION <> Exclusion of Venous Thromboembolism (VTE) - OUTPATIENT ONLY       (Emergency Department or [place])              0-499 ng/ml (FEU)  : With a low to intermediate pretest                                  probability for VTE this test result                                  excludes the diagnosis of VTE.             > 499 ng/ml (FEU)  : VTE not excluded; additional work up  for VTE is required. <> Testing on Inpatients and Evaluation of Disseminated Intravascular        Coagulation (DIC)             Reference Range:  0-499 ng/ml (FEU))  Routine Hem:  19-Mar-15 01:56   WBC (CBC)  11.7  RBC (CBC) 4.35  Hemoglobin (CBC)  11.3  Hematocrit (CBC) 35.6  Platelet Count (CBC) 256 (Result(s) reported on 30 Jun 2013 at 02:19AM.)  MCV 82  MCH 26.0  MCHC  31.8  RDW  16.9   EKG:  Interpretation NSR, diffuse TW blunting, inferior IVCD, nonspecific ST scooping I, II, V5, V6   Rate 85   Radiology Results: XRay:    19-Mar-15 02:44, Chest Portable Single View  Chest Portable Single View   REASON FOR EXAM:    chest pain  COMMENTS:       PROCEDURE: DXR - DXR PORTABLE CHEST SINGLE VIEW  - Jun 30 2013  2:44AM     CLINICAL DATA:  Chest pain.    EXAM:  PORTABLE CHEST - 1 VIEW    COMPARISON:  None.    FINDINGS:  Lungs clear. Heart size normal. No pneumothorax or pleural effusion.   IMPRESSION:  Negative chest.      Electronically Signed    By: Inge Rise M.D.    On: 06/30/2013 03:09         Verified By: Ramond Dial, M.D.,    Levaquin: N/V/Diarrhea  Vital Signs/Nurse's Notes: **Vital Signs.:   19-Mar-15 05:24  Vital Signs Type Admission  Temperature Temperature (F) 98  Celsius 36.6  Temperature Source oral  Pulse Pulse 79  Respirations Respirations 18  Systolic BP Systolic BP 277  Diastolic BP (mmHg) Diastolic BP (mmHg) 51  Mean BP 76  Pulse Ox % Pulse Ox % 95  Pulse Ox Activity Level  At rest  Oxygen Delivery Room Air/ 21 %    Impression 1. Atypical chest pain Agree that the patient's discomfort is more consistent with chest  wall pain or an inflammatory process such as costochondritis. It is sharp, focally localized, and worse on inspiration and palpation. She has had a nonproductive cough intermittently over the past month. Objectively, TnI x 2 WNL. EKG ***. Reproduced on palpation on exam. She does have a history of nonobstructive CAD- borderline lesions on 2006 cath. She has several cardiac RFs noted above.  -- Stress test ordered yesterday, okay to proceed. Will be a Agricultural consultant study. She is NPO. -- Interpret 2D echo -- Check TSH -- Cycle last troponin -- If normal studies->supportive therapy, short course of NSAIDs for costochondritis  2. CAD No evidence of ACS. Stable on follow-up last year. Cath in 2006 w/ nonobstructive dz. -- Low-dose ASA, ARB, BB, statin, NTG SL PRN -- Check Hgb A1C, lipid panel  3. Hypertension Markedly HTNive in the ED yesterday. Better control today.  -- Continue current antihypertensives w/ BP monitoring  4. Hyperlipidemia -- Check lipid panel -- Continue statin  5. Type 2 DM -- Glycemic control per primary team -- Check Hgb A1C   Electronic Signatures for Addendum Section:  Kathlyn Sacramento (MD) (Signed Addendum 19-Mar-15 11:54)  The patient was seen and examined. Agree with the above. Chest pain is overall atypical. There is chest tenderness by exam.  Given risk factors and previous history of nonobstructive CAD, agree with a stress test.   Electronic Signatures: Cortlandt Capuano A (PA-C)   (Signed 19-Mar-15 09:28)  Authored: History and Physical Exam,  Review of System, General Aspect/Present Illness, Impression/Plan, Allergies, Labs, Radiology, Vital Signs/Nurse's Notes, Home Medications, EKG Kathlyn Sacramento (MD)   (Signed 19-Mar-15 11:54)  Co-Signer: History and Physical Exam, Review of System, General Aspect/Present Illness, Impression/Plan, Allergies, Labs, Radiology, Vital Signs/Nurse's Notes, Home Medications, EKG  (Refused to sign on 23-Mar-15  08:15)  Last Updated: 23-Mar-15 08:15 by Sarah Potts (MD)

## 2014-08-07 LAB — SURGICAL PATHOLOGY

## 2018-03-25 HISTORY — PX: CORONARY ANGIOPLASTY WITH STENT PLACEMENT: SHX49

## 2020-12-04 DIAGNOSIS — I25119 Atherosclerotic heart disease of native coronary artery with unspecified angina pectoris: Secondary | ICD-10-CM | POA: Diagnosis not present

## 2020-12-04 DIAGNOSIS — Z85048 Personal history of other malignant neoplasm of rectum, rectosigmoid junction, and anus: Secondary | ICD-10-CM | POA: Diagnosis not present

## 2020-12-04 DIAGNOSIS — I4891 Unspecified atrial fibrillation: Secondary | ICD-10-CM | POA: Diagnosis not present

## 2020-12-04 DIAGNOSIS — F32A Depression, unspecified: Secondary | ICD-10-CM | POA: Diagnosis not present

## 2020-12-04 DIAGNOSIS — E785 Hyperlipidemia, unspecified: Secondary | ICD-10-CM | POA: Diagnosis not present

## 2020-12-04 DIAGNOSIS — D509 Iron deficiency anemia, unspecified: Secondary | ICD-10-CM | POA: Diagnosis not present

## 2020-12-04 DIAGNOSIS — J45909 Unspecified asthma, uncomplicated: Secondary | ICD-10-CM | POA: Diagnosis not present

## 2020-12-04 DIAGNOSIS — I10 Essential (primary) hypertension: Secondary | ICD-10-CM | POA: Diagnosis not present

## 2020-12-04 DIAGNOSIS — G609 Hereditary and idiopathic neuropathy, unspecified: Secondary | ICD-10-CM | POA: Diagnosis not present

## 2020-12-04 DIAGNOSIS — R5381 Other malaise: Secondary | ICD-10-CM | POA: Diagnosis not present

## 2020-12-04 DIAGNOSIS — E1140 Type 2 diabetes mellitus with diabetic neuropathy, unspecified: Secondary | ICD-10-CM | POA: Diagnosis not present

## 2020-12-04 DIAGNOSIS — M545 Low back pain, unspecified: Secondary | ICD-10-CM | POA: Diagnosis not present

## 2020-12-11 ENCOUNTER — Encounter (INDEPENDENT_AMBULATORY_CARE_PROVIDER_SITE_OTHER): Payer: Self-pay

## 2020-12-11 ENCOUNTER — Other Ambulatory Visit: Payer: Self-pay

## 2020-12-11 ENCOUNTER — Inpatient Hospital Stay: Payer: PPO

## 2020-12-11 ENCOUNTER — Encounter: Payer: Self-pay | Admitting: Oncology

## 2020-12-11 ENCOUNTER — Inpatient Hospital Stay: Payer: PPO | Attending: Oncology | Admitting: Oncology

## 2020-12-11 VITALS — BP 104/68 | HR 73 | Temp 97.5°F | Resp 16 | Ht 62.0 in | Wt 165.0 lb

## 2020-12-11 DIAGNOSIS — Z85048 Personal history of other malignant neoplasm of rectum, rectosigmoid junction, and anus: Secondary | ICD-10-CM | POA: Diagnosis not present

## 2020-12-11 DIAGNOSIS — N183 Chronic kidney disease, stage 3 unspecified: Secondary | ICD-10-CM

## 2020-12-11 DIAGNOSIS — Z79899 Other long term (current) drug therapy: Secondary | ICD-10-CM

## 2020-12-11 DIAGNOSIS — E1122 Type 2 diabetes mellitus with diabetic chronic kidney disease: Secondary | ICD-10-CM | POA: Diagnosis not present

## 2020-12-11 DIAGNOSIS — I129 Hypertensive chronic kidney disease with stage 1 through stage 4 chronic kidney disease, or unspecified chronic kidney disease: Secondary | ICD-10-CM | POA: Diagnosis not present

## 2020-12-11 DIAGNOSIS — D649 Anemia, unspecified: Secondary | ICD-10-CM | POA: Diagnosis not present

## 2020-12-11 DIAGNOSIS — Z7901 Long term (current) use of anticoagulants: Secondary | ICD-10-CM | POA: Diagnosis not present

## 2020-12-11 DIAGNOSIS — Z7189 Other specified counseling: Secondary | ICD-10-CM

## 2020-12-11 NOTE — Progress Notes (Signed)
New pt moved to [facility] from [place].  Pt has history of anal cancer (4 years ago) and has colostomy.  Pt was referred by Dr Kary Kos for oncology follow up.  Pt was due for PET scan in February 2022 and has not had.

## 2020-12-11 NOTE — Progress Notes (Signed)
Hematology/Oncology Consult note [facility] Telephone:([phone] [phone]   Patient Care Team: Maryland Pink, MD as PCP - General (Family Medicine)  REFERRING PROVIDER: Maryland Pink, MD  CHIEF COMPLAINTS/REASON FOR VISIT:  Evaluation of history of anal cancer  HISTORY OF PRESENTING ILLNESS:   Sarah Potts is a  87 y.o.  female with PMH listed below was seen in consultation at the request of  Maryland Pink, MD  for evaluation of history of anal cancer  Patient reports history of anal cancer in 2017. I obtained previous oncology records and extensive medical record review was performed by me  Patient has noticed a lesion around her anus for about 2 years prior to seeking medical attention.  The lesion was mostly exophytic and about 4 cm in greatest dimension.  The lesion was initially felt to be confined to the perianal skin. 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  Associated heavy inflammatory infiltrate present at the base of the tumor.  Tumor does not extend to inked deep surgical margin.  Patient was seen by radiation oncology, clinical staging was T2N0 M0 and received adjuvant radiation from 7/272017-11/11/2015.  11/22/2015, PET scan showed hypermetabolic activity in the anus consistent with patient's history of anal cancer with metastatic and right iliac chain/inguinal adenopathy.  02/05/2016.  Patient finished an additional radiation treatments with Dr. Aline Brochure with attention on just to the right inguinal nodes as well as the primary lesion.  05/30/2016, follow-up PET scan showed resolution of previously noted hypermetabolic inguinal lymph node adenopathy.  Physiological bowel uptake making it difficult to tell whether the residual uptake within the rectum is physiological or residual tumor.  No waning uptake is identified.  Continued follow-up is  recommended.  03/17/2018, bone scan showed focal uptake identified involving the right first rib anteriorly at approximately the fifth rib. Patient's pain is primarily in her spine.  No point tenderness over her ribs. 03/24/2018, MRI cervical thoracic lumbar spine showed no metastatic lesion.  Patient has spondylosis.  Patient reports that she had some image scan done in 2020-not available in current medical records that we obtained..  No images done in 2021.  She recalls that she got chemotherapy.-No records available at this point.  Today patient reports feeling well.  Denies any rectal bleeding or discomfort.  Review of Systems  Constitutional:  Negative for appetite change, chills, fatigue and fever.  HENT:   Negative for hearing loss and voice change.   Eyes:  Negative for eye problems.  Respiratory:  Negative for chest tightness and cough.   Cardiovascular:  Negative for chest pain.  Gastrointestinal:  Negative for abdominal distention, abdominal pain and blood in stool.  Endocrine: Negative for hot flashes.  Genitourinary:  Negative for difficulty urinating and frequency.   Musculoskeletal:  Negative for arthralgias.  Skin:  Negative for itching and rash.  Neurological:  Negative for extremity weakness.  Hematological:  Negative for adenopathy.  Psychiatric/Behavioral:  Negative for confusion.    MEDICAL HISTORY:  Past Medical History:  Diagnosis Date   Diabetes mellitus    Hyperlipidemia    Hypertension     SURGICAL HISTORY: Past Surgical History:  Procedure Laterality Date   BLADDER SURGERY     CARDIAC CATHETERIZATION     heart stent     TONSILLECTOMY     uterus surgery     removed    SOCIAL HISTORY: Social History   Socioeconomic History   Marital status:  Single    Spouse name: Not on file   Number of children: Not on file   Years of education: Not on file   Highest education level: Not on file  Occupational History   Not on file  Tobacco Use   Smoking  status: Never   Smokeless tobacco: Not on file  Substance and Sexual Activity   Alcohol use: No   Drug use: No   Sexual activity: Not on file  Other Topics Concern   Not on file  Social History Narrative   Not on file   Social Determinants of Health   Financial Resource Strain: Not on file  Food Insecurity: Not on file  Transportation Needs: Not on file  Physical Activity: Not on file  Stress: Not on file  Social Connections: Not on file  Intimate Partner Violence: Not on file    FAMILY HISTORY: Family History  Problem Relation Age of Onset   Cancer Mother    Breast cancer Mother    Heart disease Father    Heart attack Father    Cancer Sister    Heart disease Sister    Cervical cancer Sister    Heart disease Brother     ALLERGIES:  is allergic to levofloxacin and niacin and related.  MEDICATIONS:  Current Outpatient Medications  Medication Sig Dispense Refill   amLODipine (NORVASC) 5 MG tablet Take by mouth.     apixaban (ELIQUIS) 2.5 MG TABS tablet Take by mouth.     atorvastatin (LIPITOR) 20 MG tablet Take by mouth.     Cholecalciferol 25 MCG (1000 UT) tablet Take by mouth. Pt takes 3 daily     ELIQUIS 2.5 MG TABS tablet Take 2.5 mg by mouth 2 (two) times daily.     ezetimibe (ZETIA) 10 MG tablet Take 1 tablet by mouth daily.     losartan (COZAAR) 100 MG tablet Take 1 tablet by mouth daily.     magnesium oxide (MAG-OX) 400 MG tablet Take by mouth.     metFORMIN (GLUMETZA) 1000 MG (MOD) 24 hr tablet Take 1,000 mg by mouth 2 (two) times daily with a meal.       metoprolol tartrate (LOPRESSOR) 50 MG tablet Take 1 tablet by mouth 2 (two) times daily.     mirabegron ER (MYRBETRIQ) 50 MG TB24 tablet Take by mouth.     montelukast (SINGULAIR) 10 MG tablet Take 10 mg by mouth at bedtime.       NITROSTAT 0.4 MG SL tablet Place 0.4 mg under the tongue every 5 (five) minutes as needed.      omega-3 acid ethyl esters (LOVAZA) 1 g capsule Take 2 g by mouth 2 (two) times  daily.       omeprazole (PRILOSEC) 40 MG capsule Take 40 mg by mouth daily.       pregabalin (LYRICA) 50 MG capsule Take 50 mg by mouth 3 (three) times daily.     sertraline (ZOLOFT) 50 MG tablet Take 1 tablet by mouth daily. Pt taking 1/2 daily     No current facility-administered medications for this visit.     PHYSICAL EXAMINATION: ECOG PERFORMANCE STATUS: 1 - Symptomatic but completely ambulatory Vitals:   12/11/20 1137  BP: 104/68  Pulse: 73  Resp: 16  Temp: (!) 97.5 F (36.4 C)  SpO2: 98%   Filed Weights   12/11/20 1137  Weight: 165 lb (74.8 kg)    Physical Exam Constitutional:      General: She is not in  acute distress. HENT:     Head: Normocephalic and atraumatic.  Eyes:     General: No scleral icterus. Cardiovascular:     Rate and Rhythm: Normal rate and regular rhythm.     Heart sounds: Normal heart sounds.  Pulmonary:     Effort: Pulmonary effort is normal. No respiratory distress.     Breath sounds: No wheezing.  Abdominal:     General: Bowel sounds are normal. There is no distension.     Palpations: Abdomen is soft.  Musculoskeletal:        General: No deformity. Normal range of motion.     Cervical back: Normal range of motion and neck supple.  Skin:    General: Skin is warm and dry.     Findings: No erythema or rash.  Neurological:     Mental Status: She is alert and oriented to person, place, and time. Mental status is at baseline.     Cranial Nerves: No cranial nerve deficit.     Coordination: Coordination normal.  Psychiatric:        Mood and Affect: Mood normal.    LABORATORY DATA:  I have reviewed the data as listed Lab Results  Component Value Date   WBC 11.7 (H) 06/30/2013   HGB 11.3 (L) 06/30/2013   HCT 35.6 06/30/2013   MCV 82 06/30/2013   PLT 256 06/30/2013   No results for input(s): NA, K, CL, CO2, GLUCOSE, BUN, CREATININE, CALCIUM, GFRNONAA, GFRAA, PROT, ALBUMIN, AST, ALT, ALKPHOS, BILITOT, BILIDIR, IBILI in the last 8760  hours. Iron/TIBC/Ferritin/ %Sat No results found for: IRON, TIBC, FERRITIN, IRONPCTSAT    RADIOGRAPHIC STUDIES: I have personally reviewed the radiological images as listed and agreed with the findings in the report. No results found.    ASSESSMENT & PLAN:  1. History of anal cancer   2. Goals of care, counseling/discussion   3. Normocytic anemia    #History of anal cancer in 2017. Per record and per history provided by patient's, Patient is status post concurrent chemoradiation. I recommend patient to have CBC and CMP done Recommend CT chest abdomen pelvis with contrast if kidney function allows Referred patient to establish care with surgery Dr. Peyton Najjar for visualization of perianal area.  Anemia, check B12, folate, SPEP, iron tibc ferritin Addendum  Lab was reviewed. She has CKD stage III, avoid nephrotoxin. Will obtain MRI abdomen pelvis w contrast and CT chest wo contrast Follow-up annually. Orders Placed This Encounter  Procedures   Ambulatory referral to General Surgery    Referral Priority:   Routine    Referral Type:   Surgical    Referral Reason:   Specialty Services Required    Referred to Provider:   Herbert Pun, MD    Requested Specialty:   General Surgery    Number of Visits Requested:   1    All questions were answered. The patient knows to call the clinic with any problems questions or concerns.  cc Maryland Pink, MD   Thank you for this kind referral and the opportunity to participate in the care of this patient. A copy of today's note is routed to referring provider    [name], MD, PhD Hematology [facility]cology [facility] Pager- [phone] 12/11/2020

## 2020-12-18 ENCOUNTER — Other Ambulatory Visit: Payer: Self-pay

## 2020-12-18 DIAGNOSIS — Z7189 Other specified counseling: Secondary | ICD-10-CM | POA: Insufficient documentation

## 2020-12-18 DIAGNOSIS — Z85048 Personal history of other malignant neoplasm of rectum, rectosigmoid junction, and anus: Secondary | ICD-10-CM

## 2020-12-20 ENCOUNTER — Other Ambulatory Visit: Payer: Self-pay

## 2020-12-20 ENCOUNTER — Inpatient Hospital Stay: Payer: PPO | Attending: Oncology

## 2020-12-20 DIAGNOSIS — Z923 Personal history of irradiation: Secondary | ICD-10-CM | POA: Diagnosis not present

## 2020-12-20 DIAGNOSIS — Z79899 Other long term (current) drug therapy: Secondary | ICD-10-CM | POA: Diagnosis not present

## 2020-12-20 DIAGNOSIS — Z9221 Personal history of antineoplastic chemotherapy: Secondary | ICD-10-CM | POA: Diagnosis not present

## 2020-12-20 DIAGNOSIS — N186 End stage renal disease: Secondary | ICD-10-CM | POA: Diagnosis not present

## 2020-12-20 DIAGNOSIS — R918 Other nonspecific abnormal finding of lung field: Secondary | ICD-10-CM | POA: Diagnosis not present

## 2020-12-20 DIAGNOSIS — Z85048 Personal history of other malignant neoplasm of rectum, rectosigmoid junction, and anus: Secondary | ICD-10-CM | POA: Diagnosis not present

## 2020-12-20 DIAGNOSIS — E538 Deficiency of other specified B group vitamins: Secondary | ICD-10-CM | POA: Insufficient documentation

## 2020-12-20 LAB — COMPREHENSIVE METABOLIC PANEL
ALT: 13 U/L (ref 0–44)
AST: 19 U/L (ref 15–41)
Albumin: 3.6 g/dL (ref 3.5–5.0)
Alkaline Phosphatase: 42 U/L (ref 38–126)
Anion gap: 8 (ref 5–15)
BUN: 17 mg/dL (ref 8–23)
CO2: 23 mmol/L (ref 22–32)
Calcium: 8.7 mg/dL — ABNORMAL LOW (ref 8.9–10.3)
Chloride: 102 mmol/L (ref 98–111)
Creatinine, Ser: 1.14 mg/dL — ABNORMAL HIGH (ref 0.44–1.00)
GFR, Estimated: 47 mL/min — ABNORMAL LOW (ref >60)
Glucose, Bld: 135 mg/dL — ABNORMAL HIGH (ref 70–99)
Potassium: 4.5 mmol/L (ref 3.5–5.1)
Sodium: 133 mmol/L — ABNORMAL LOW (ref 135–145)
Total Bilirubin: 0.7 mg/dL (ref 0.3–1.2)
Total Protein: 6.6 g/dL (ref 6.5–8.1)

## 2020-12-20 LAB — CBC WITH DIFFERENTIAL/PLATELET
Abs Immature Granulocytes: 0.02 10*3/uL (ref 0.00–0.07)
Basophils Absolute: 0 10*3/uL (ref 0.0–0.1)
Basophils Relative: 1 %
Eosinophils Absolute: 0.1 10*3/uL (ref 0.0–0.5)
Eosinophils Relative: 2 %
HCT: 31.4 % — ABNORMAL LOW (ref 36.0–46.0)
Hemoglobin: 10.5 g/dL — ABNORMAL LOW (ref 12.0–15.0)
Immature Granulocytes: 0 %
Lymphocytes Relative: 23 %
Lymphs Abs: 1.2 10*3/uL (ref 0.7–4.0)
MCH: 31.9 pg (ref 26.0–34.0)
MCHC: 33.4 g/dL (ref 30.0–36.0)
MCV: 95.4 fL (ref 80.0–100.0)
Monocytes Absolute: 0.4 10*3/uL (ref 0.1–1.0)
Monocytes Relative: 7 %
Neutro Abs: 3.4 10*3/uL (ref 1.7–7.7)
Neutrophils Relative %: 67 %
Platelets: 224 10*3/uL (ref 150–400)
RBC: 3.29 MIL/uL — ABNORMAL LOW (ref 3.87–5.11)
RDW: 13.5 % (ref 11.5–15.5)
WBC: 5.1 10*3/uL (ref 4.0–10.5)
nRBC: 0 % (ref 0.0–0.2)

## 2020-12-21 ENCOUNTER — Telehealth: Payer: Self-pay

## 2020-12-21 ENCOUNTER — Other Ambulatory Visit: Payer: Self-pay

## 2020-12-21 DIAGNOSIS — Z85048 Personal history of other malignant neoplasm of rectum, rectosigmoid junction, and anus: Secondary | ICD-10-CM

## 2020-12-21 NOTE — Telephone Encounter (Signed)
-----   Message from [name], MD sent at 12/20/2020 10:22 PM EDT ----- Please arrange her to have CT chest wo contrast + MRI abdomen/pelvis w contrast For anal cancer follow up Also she has anemia, I recommend her to do additional blood work.- ordered.

## 2020-12-21 NOTE — Telephone Encounter (Signed)
Spoke to patient and she agrees to come for labs on 9/14 @ 2:30 (scheduled).   Please schedule patient for CT chest and MRI and notify pt of appts. Pt states that next week she works and she available after 1 pm. She will be available anytime in 2 weeks .

## 2020-12-21 NOTE — Telephone Encounter (Signed)
Left message on voicemail that Dr. Tasia Catchings wanted to have her scheduled for appointments.  Requested a call back for more detail.  Orders entered.

## 2020-12-26 ENCOUNTER — Other Ambulatory Visit: Payer: Self-pay

## 2020-12-26 ENCOUNTER — Inpatient Hospital Stay: Payer: PPO

## 2020-12-26 DIAGNOSIS — Z85048 Personal history of other malignant neoplasm of rectum, rectosigmoid junction, and anus: Secondary | ICD-10-CM | POA: Diagnosis not present

## 2020-12-26 DIAGNOSIS — D649 Anemia, unspecified: Secondary | ICD-10-CM

## 2020-12-26 DIAGNOSIS — Z7189 Other specified counseling: Secondary | ICD-10-CM

## 2020-12-26 LAB — FERRITIN: Ferritin: 13 ng/mL (ref 11–307)

## 2020-12-26 LAB — VITAMIN B12: Vitamin B-12: 93 pg/mL — ABNORMAL LOW (ref 180–914)

## 2020-12-26 LAB — IRON AND TIBC
Iron: 71 ug/dL (ref 28–170)
Saturation Ratios: 21 % (ref 10.4–31.8)
TIBC: 335 ug/dL (ref 250–450)
UIBC: 264 ug/dL

## 2020-12-26 LAB — FOLATE: Folate: 7.9 ng/mL

## 2020-12-27 DIAGNOSIS — Z933 Colostomy status: Secondary | ICD-10-CM | POA: Diagnosis not present

## 2020-12-27 DIAGNOSIS — C210 Malignant neoplasm of anus, unspecified: Secondary | ICD-10-CM | POA: Diagnosis not present

## 2020-12-28 LAB — PROTEIN ELECTROPHORESIS, SERUM
A/G Ratio: 1.3 (ref 0.7–1.7)
Albumin ELP: 3.4 g/dL (ref 2.9–4.4)
Alpha-1-Globulin: 0.2 g/dL (ref 0.0–0.4)
Alpha-2-Globulin: 1 g/dL (ref 0.4–1.0)
Beta Globulin: 0.9 g/dL (ref 0.7–1.3)
Gamma Globulin: 0.7 g/dL (ref 0.4–1.8)
Globulin, Total: 2.7 g/dL (ref 2.2–3.9)
Total Protein ELP: 6.1 g/dL (ref 6.0–8.5)

## 2021-01-04 ENCOUNTER — Telehealth: Payer: Self-pay | Admitting: *Deleted

## 2021-01-04 ENCOUNTER — Other Ambulatory Visit: Payer: Self-pay | Admitting: Oncology

## 2021-01-04 MED ORDER — ALPRAZOLAM 0.5 MG PO TABS: 0.5000 mg | ORAL_TABLET | ORAL | 0 refills | Status: DC

## 2021-01-04 NOTE — Telephone Encounter (Signed)
Returned call to pt, she uses [facility] in [place] (pharmacy added). Pt informed that xanax will be sent to pharmacy.

## 2021-01-04 NOTE — Telephone Encounter (Signed)
Call from patient requesting medicine for anxiety to take prior to upcoming MRI appointment

## 2021-01-04 NOTE — Telephone Encounter (Signed)
Dr. Tasia Catchings will send Xanax to her pharmacy, called pt to see which pharmacy she uses but no answer. Asked her to call back to provide pharmacy infor.

## 2021-01-07 ENCOUNTER — Other Ambulatory Visit: Payer: Self-pay

## 2021-01-07 ENCOUNTER — Ambulatory Visit
Admission: RE | Admit: 2021-01-07 | Discharge: 2021-01-07 | Disposition: A | Discharge status: Home / Self Care | Payer: PPO | Source: Ambulatory Visit | Attending: Oncology | Admitting: Oncology

## 2021-01-07 ENCOUNTER — Ambulatory Visit: Payer: PPO

## 2021-01-07 DIAGNOSIS — Z933 Colostomy status: Secondary | ICD-10-CM | POA: Insufficient documentation

## 2021-01-07 DIAGNOSIS — Z85048 Personal history of other malignant neoplasm of rectum, rectosigmoid junction, and anus: Secondary | ICD-10-CM

## 2021-01-07 DIAGNOSIS — R911 Solitary pulmonary nodule: Secondary | ICD-10-CM | POA: Diagnosis not present

## 2021-01-07 DIAGNOSIS — C649 Malignant neoplasm of unspecified kidney, except renal pelvis: Secondary | ICD-10-CM | POA: Diagnosis not present

## 2021-01-07 DIAGNOSIS — Z90710 Acquired absence of both cervix and uterus: Secondary | ICD-10-CM | POA: Diagnosis not present

## 2021-01-07 DIAGNOSIS — N186 End stage renal disease: Secondary | ICD-10-CM | POA: Diagnosis not present

## 2021-01-07 DIAGNOSIS — R188 Other ascites: Secondary | ICD-10-CM | POA: Diagnosis not present

## 2021-01-07 IMAGING — CT CT CHEST W/O CM
2 of 3 series · 15 of 36 positions shown, 18 images · non-contrast
Comparison: None.

CLINICAL DATA: Anal cancer.  Surveillance exam

EXAM:
CT CHEST WITHOUT CONTRAST
TECHNIQUE: Multidetector CT imaging of the chest was performed following the
standard protocol without IV contrast.

[Series 2: thorax · axial · 0.69mm/px · z∈[-624,-360]mm · 12 of 156 slices shown, 15 images]
[im 12/156  mediastinal]
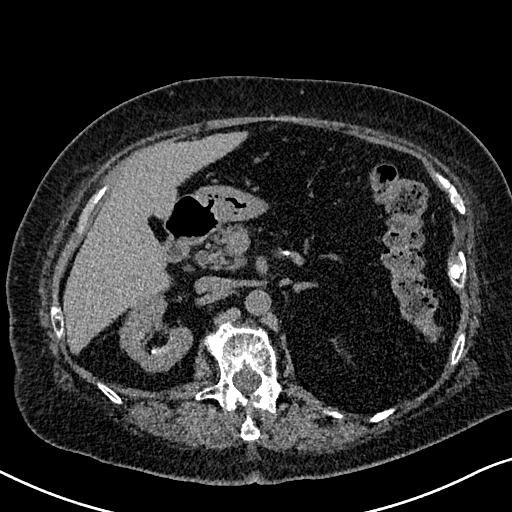
[im 12/156  lung]
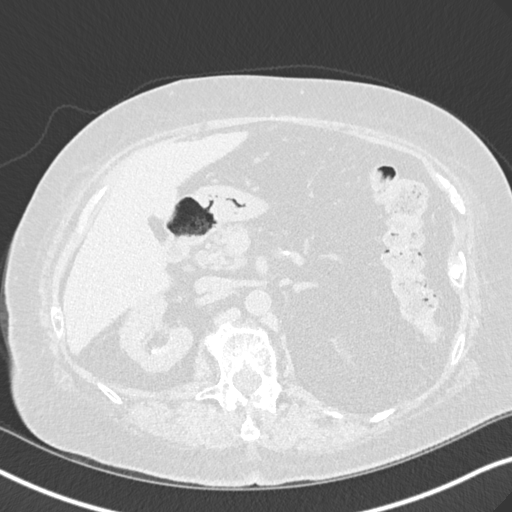
[im 23/156  lung]
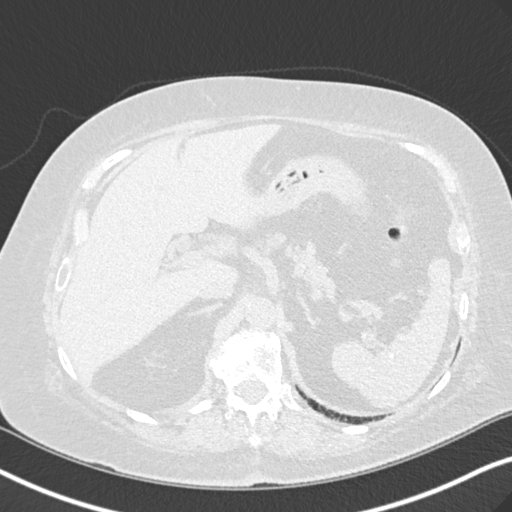
[im 35/156  lung]
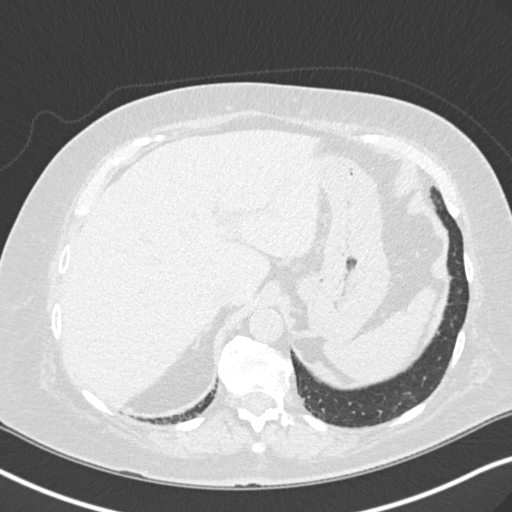
[im 46/156  lung]
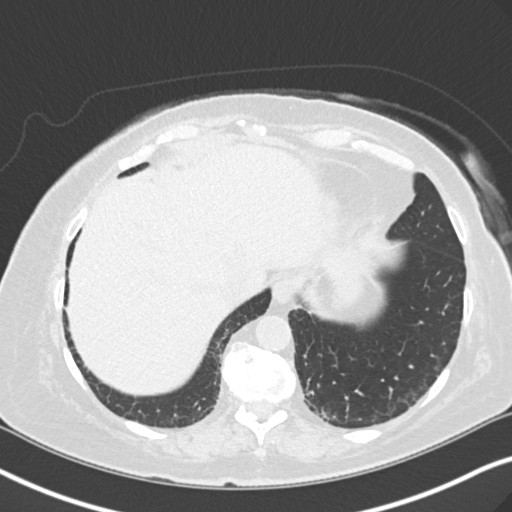
[im 58/156  mediastinal]
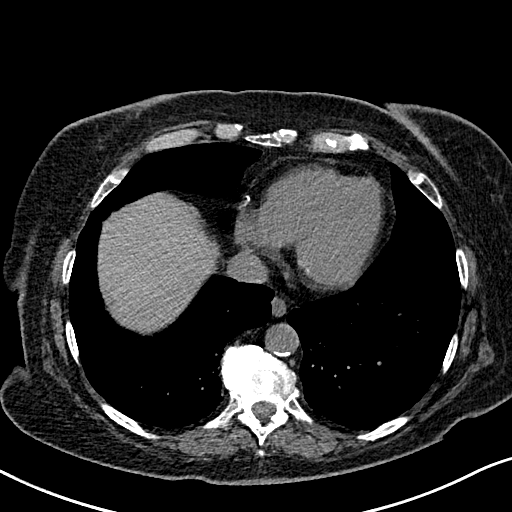
[im 58/156  lung]
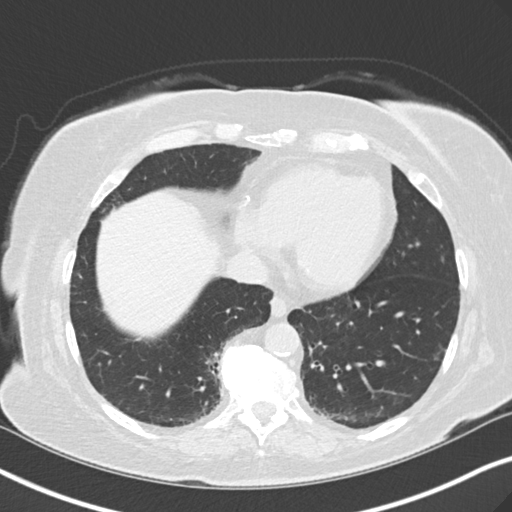
[im 69/156  lung]
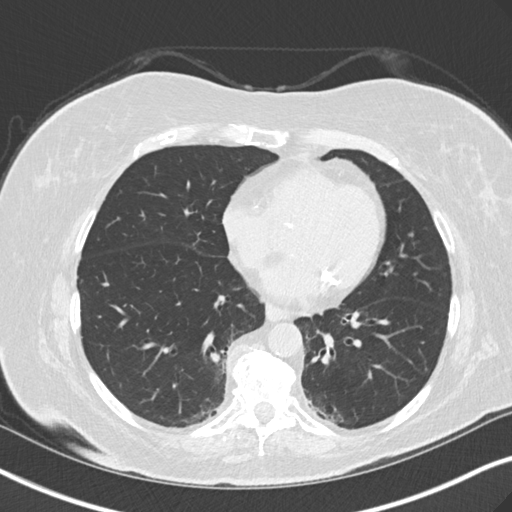
[im 87/156  lung]
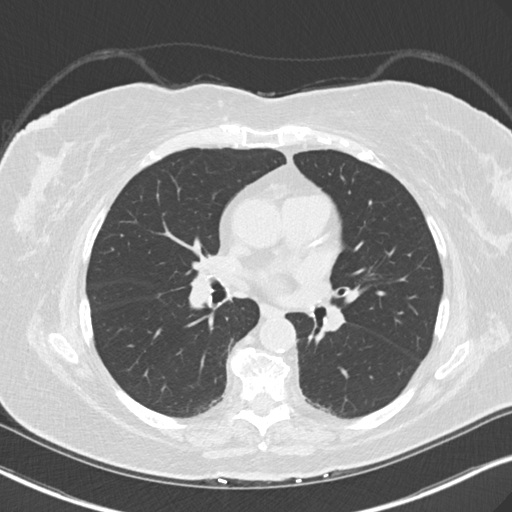
[im 98/156  lung]
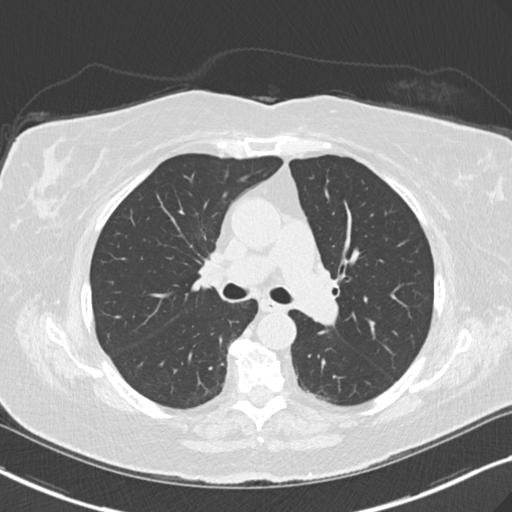
[im 110/156  mediastinal]
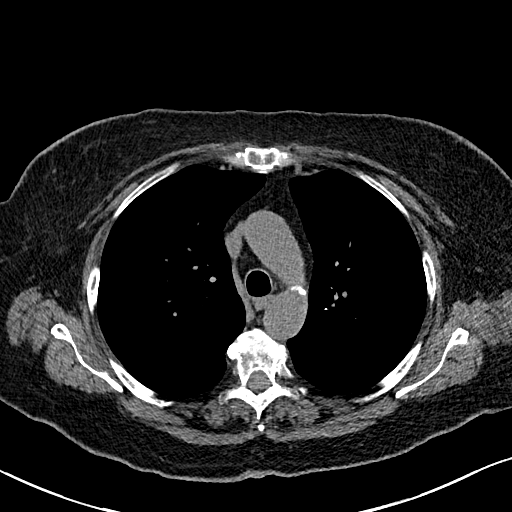
[im 110/156  lung]
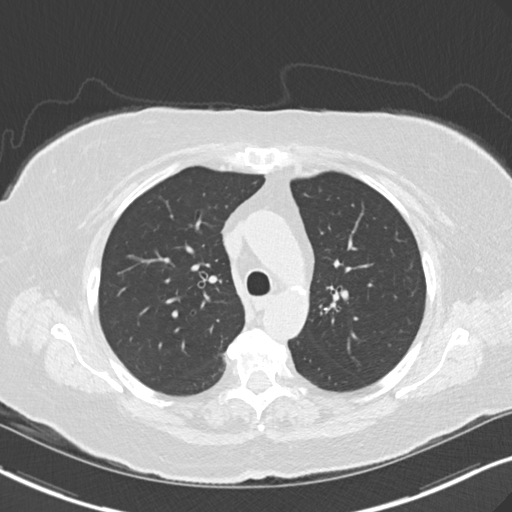
[im 121/156  lung]
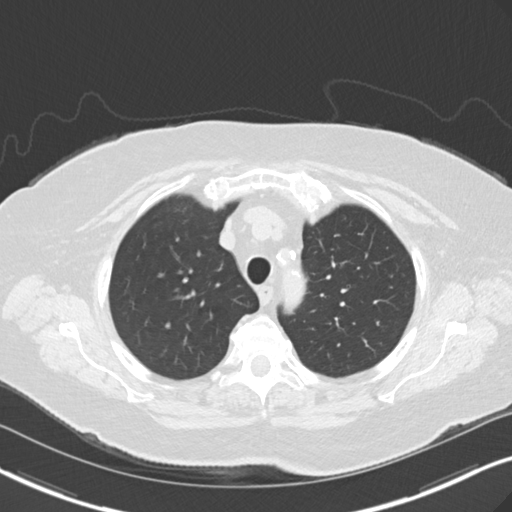
[im 133/156  lung]
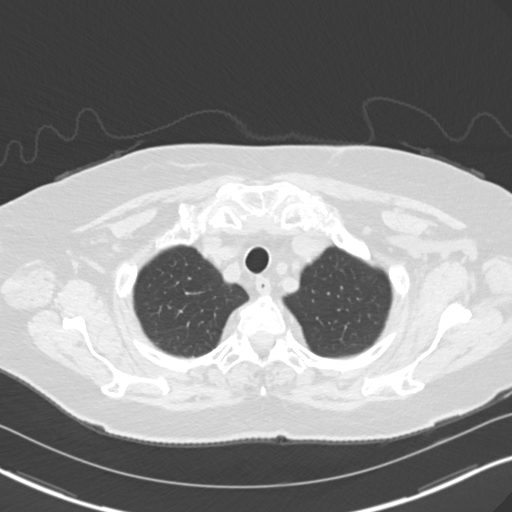
[im 144/156  lung]
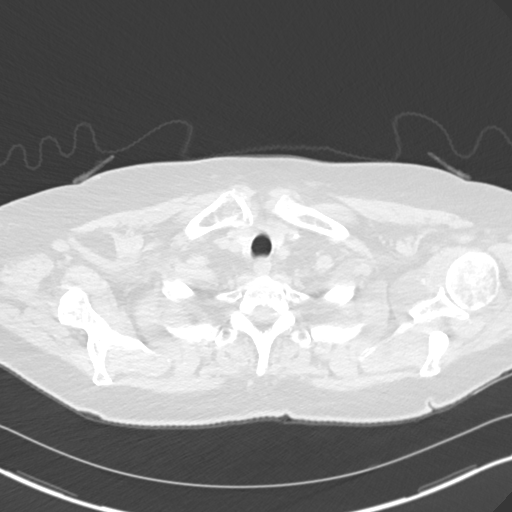

[Series 5: coronal · coronal · 0.64mm/px · 3 of 145 slices shown]
[im 29/145  lung]
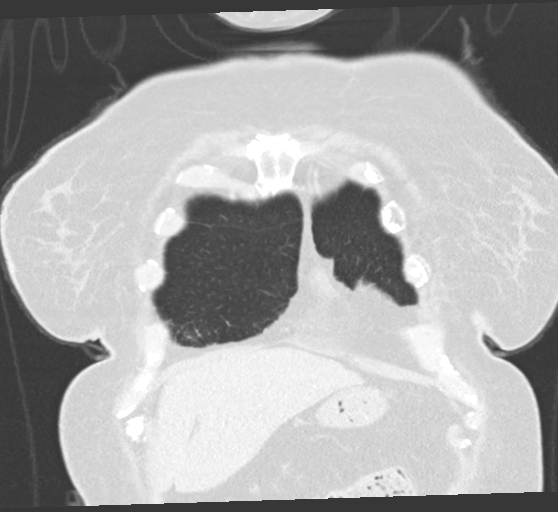
[im 58/145  lung]
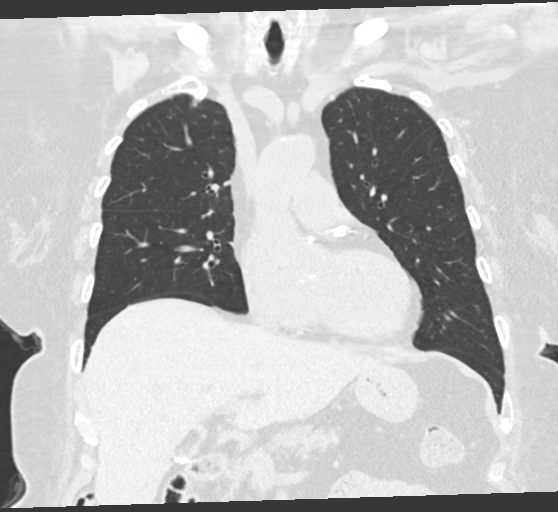
[im 87/145  lung]
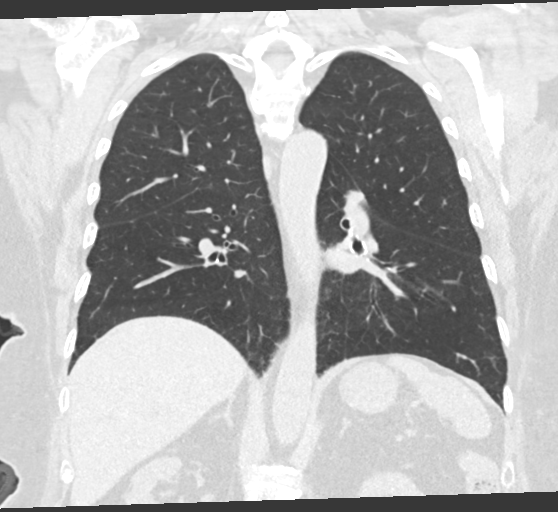

[15 of 36 positions shown; findings below may reference images not displayed]

FINDINGS: Cardiovascular: Coronary artery calcification and aortic
atherosclerotic calcification.

Mediastinum/Nodes: No axillary or supraclavicular adenopathy. No
mediastinal or hilar adenopathy. No pericardial fluid. Esophagus
normal.

Lungs/Pleura:

RIGHT lobe pulmonary nodule measures 6 mm (image 62/4).

RIGHT lower lobe nodule measures 4 mm (image 60/4)

Smaller 3 mm nodule in the LEFT lower lobe on image 113/4).

Upper Abdomen: Limited view of the liver, kidneys, pancreas are
unremarkable. Normal adrenal glands.

Musculoskeletal: No aggressive osseous lesion. Degenerative
osteophytosis of the spine.
IMPRESSION: 1. Rounded pulmonary nodule in the RIGHT lower lobe measuring 6 mm
warrants follow-up. Recommend CT thorax in 3 months.
2. No mediastinal lymphadenopathy.

## 2021-01-07 IMAGING — MR MR PELVIS WO/W CM
23 series · 48 of 48 positions shown · IV contrast (gadavist)
Comparison: None.

CLINICAL DATA: Renal cancer follow-up. End-stage renal disease. 7
mL Gadavist

EXAM:
MRI ABDOMEN AND PELVIS WITHOUT AND WITH CONTRAST
TECHNIQUE: Multiplanar multisequence MR imaging of the abdomen and pelvis was
performed both before and after the administration of intravenous
contrast.
CONTRAST:  7mL GADAVIST GADOBUTROL 1 MMOL/ML IV SOLN

[Series 4: T2 · coronal · 6.0mm · 1.19mm/px · 1 of 30 slices shown (1 of 5)]
[im 1/30]
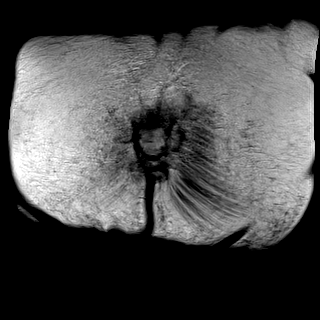

[Series 5: T2 · axial · 6.0mm · 1.19mm/px · 1 of 39 slices shown (2 of 5)]
[im 1/39]
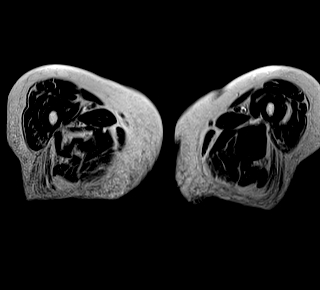

[Series 6: T2 · sagittal · 6.0mm · 1.19mm/px · 1 of 44 slices shown (3 of 5)]
[im 1/44]
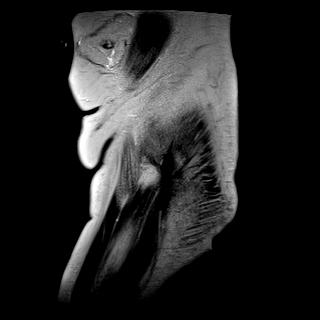

[Series 7: T1 · axial · 3.0mm · 1.19mm/px · z∈[-388,-127]mm · 3 of 88 slices shown (1 of 2)]
[im 1/88]
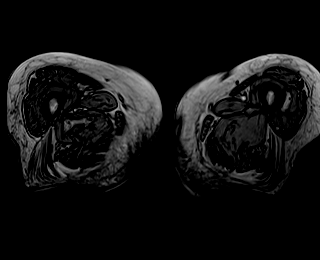
[im 44/88]
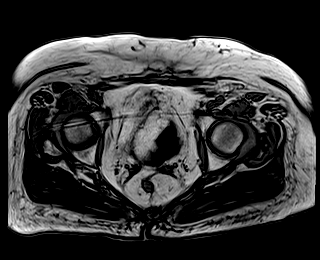
[im 88/88]
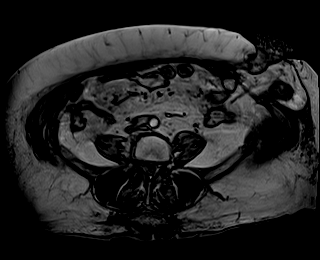

[Series 8: T1 fat-sat · axial · 3.0mm · 1.19mm/px · z∈[-315,-102]mm · 2 of 72 slices shown (1 of 2)]
[im 1/72]
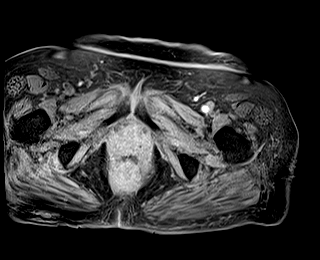
[im 72/72]
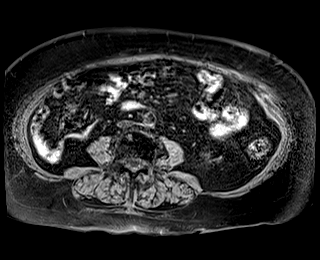

[Series 9: T2 · coronal · 6.0mm · 1.19mm/px · 1 of 34 slices shown (4 of 5)]
[im 1/34]
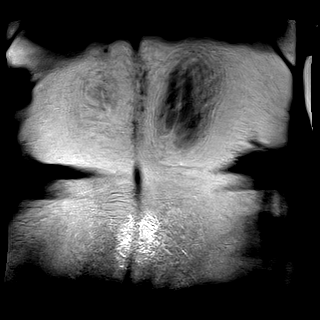

[Series 10: bSSFP · axial · 6.0mm · 0.74mm/px · 1 of 32 slices shown]
[im 1/32]
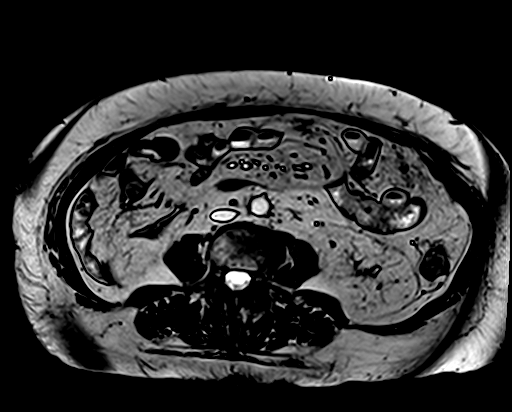

[Series 11: T2 fat-sat · axial · 6.0mm · 1.19mm/px · 1 of 34 slices shown]
[im 1/34]
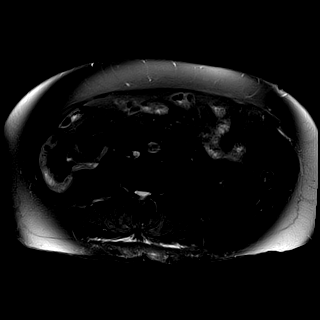

[Series 12: T1 · axial · 6.0mm · 0.74mm/px · z∈[-96,+127]mm · 2 of 64 slices shown (2 of 2)]
[im 1/64]
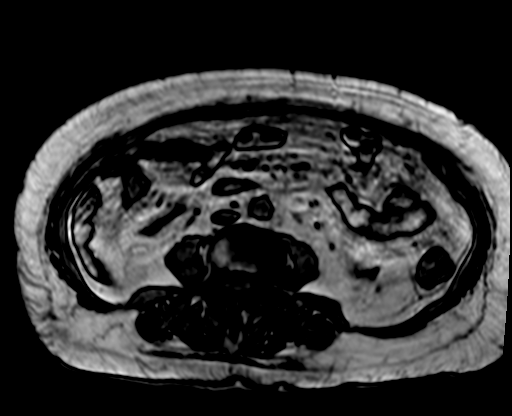
[im 64/64]
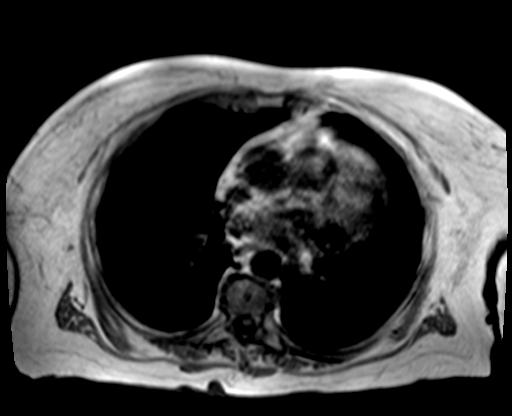

[Series 13: T2 · axial · 6.0mm · 1.19mm/px · 1 of 34 slices shown (5 of 5)]
[im 1/34]
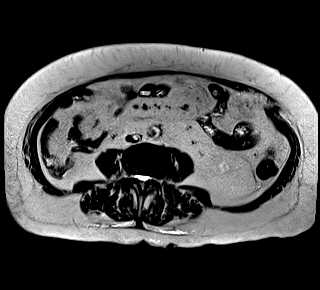

[Series 14: T1 dynamic fat-sat · axial · non-contrast · 3.2mm · 1.19mm/px · z∈[-111,+141]mm · 3 of 80 slices shown]
[im 1/80]
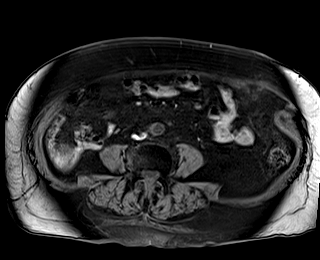
[im 40/80]
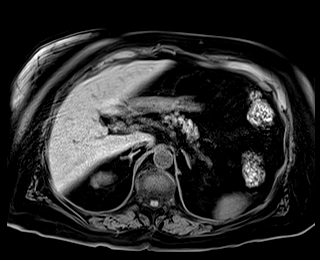
[im 80/80]
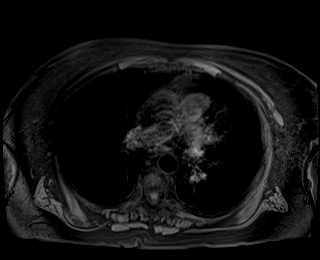

[Series 15: T1 dynamic fat-sat post-contrast · axial · 3.2mm · 1.19mm/px · z∈[-111,+141]mm · 3 of 80 slices shown (1 of 8)]
[im 1/80]
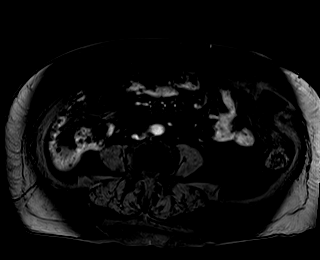
[im 40/80]
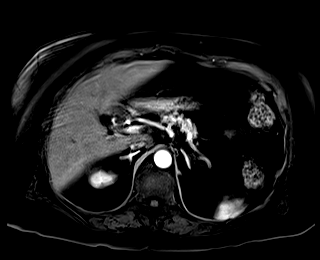
[im 80/80]
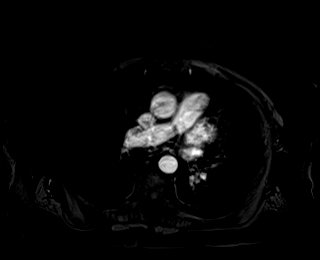

[Series 16: T1 dynamic fat-sat post-contrast · axial · 3.2mm · 1.19mm/px · z∈[-111,+141]mm · 3 of 80 slices shown (2 of 8)]
[im 1/80]
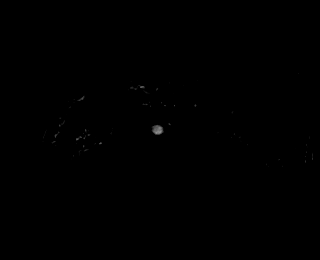
[im 40/80]
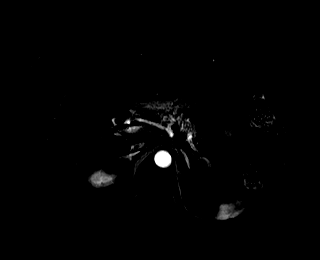
[im 80/80]
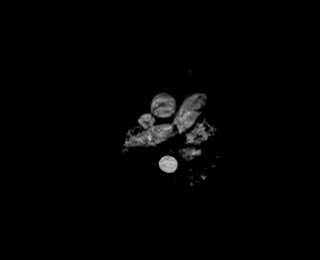

[Series 17: T1 dynamic fat-sat post-contrast · axial · 3.2mm · 1.19mm/px · z∈[-111,+141]mm · 3 of 80 slices shown (3 of 8)]
[im 1/80]
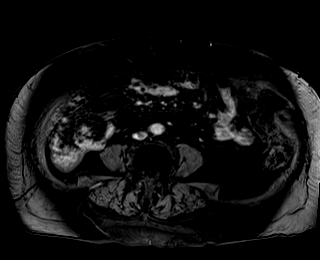
[im 40/80]
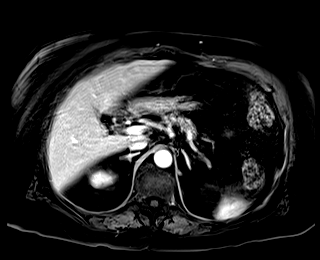
[im 80/80]
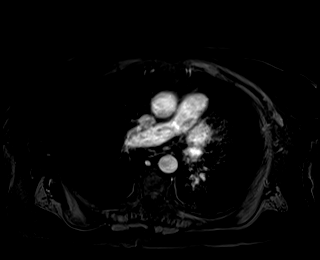

[Series 18: T1 dynamic fat-sat post-contrast · axial · 3.2mm · 1.19mm/px · z∈[-111,+141]mm · 3 of 80 slices shown (4 of 8)]
[im 1/80]
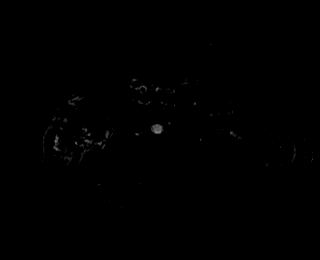
[im 40/80]
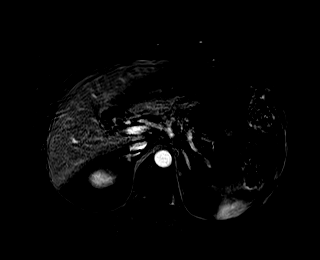
[im 80/80]
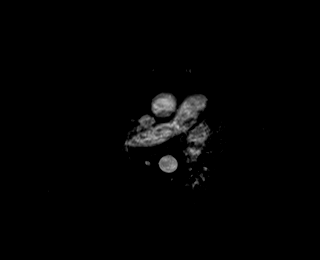

[Series 19: T1 dynamic fat-sat post-contrast · axial · 3.2mm · 1.19mm/px · z∈[-111,+141]mm · 3 of 80 slices shown (5 of 8)]
[im 1/80]
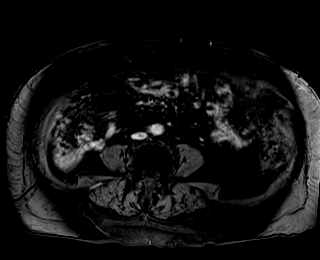
[im 40/80]
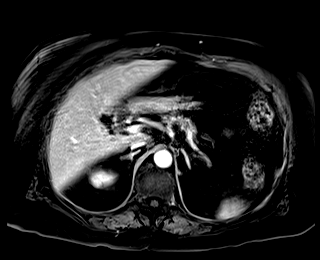
[im 80/80]
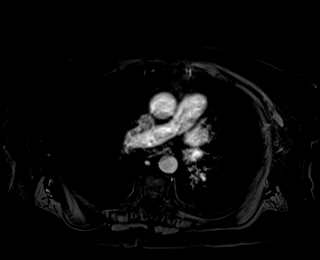

[Series 20: T1 dynamic fat-sat post-contrast · axial · 3.2mm · 1.19mm/px · z∈[-111,+141]mm · 3 of 80 slices shown (6 of 8)]
[im 1/80]
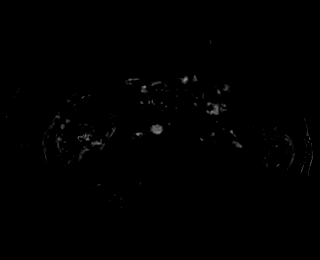
[im 40/80]
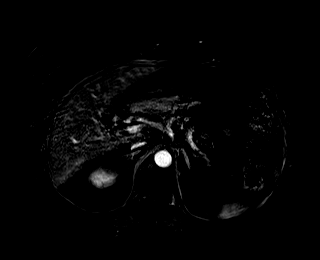
[im 80/80]
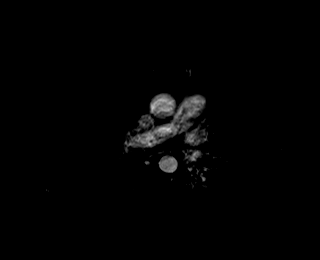

[Series 21: T1 dynamic post-contrast · coronal · 3.0mm · 1.31mm/px · 2 of 72 slices shown]
[im 1/72]
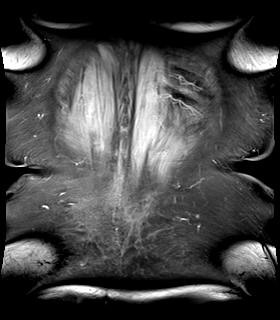
[im 72/72]
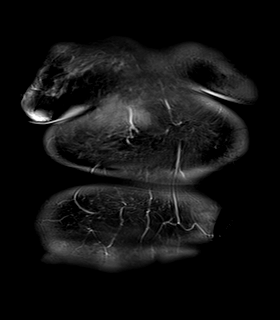

[Series 22: T1 dynamic fat-sat post-contrast · axial · 3.2mm · 1.19mm/px · z∈[-111,+141]mm · 3 of 80 slices shown (7 of 8)]
[im 1/80]
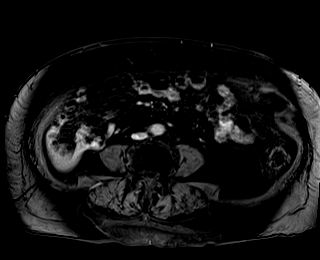
[im 40/80]
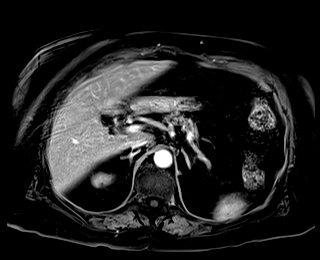
[im 80/80]
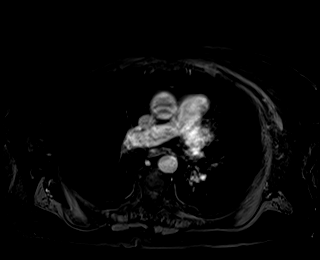

[Series 23: T1 dynamic fat-sat post-contrast · axial · 3.2mm · 1.19mm/px · z∈[-111,+141]mm · 3 of 80 slices shown (8 of 8)]
[im 1/80]
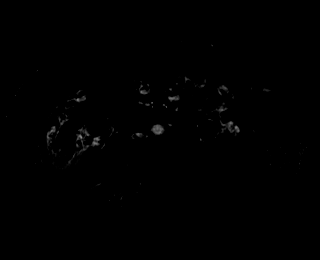
[im 40/80]
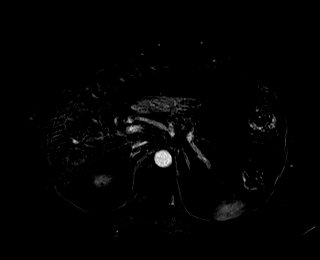
[im 80/80]
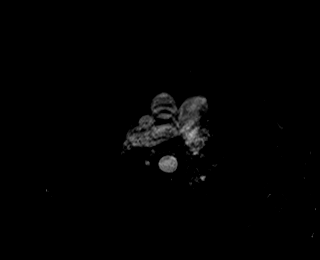

[Series 24: T1 fat-sat · axial · 3.0mm · 1.19mm/px · z∈[-388,-127]mm · 3 of 88 slices shown (2 of 2)]
[im 1/88]
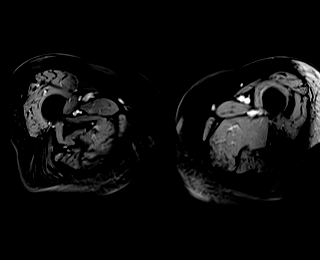
[im 44/88]
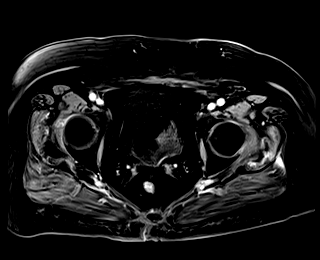
[im 88/88]
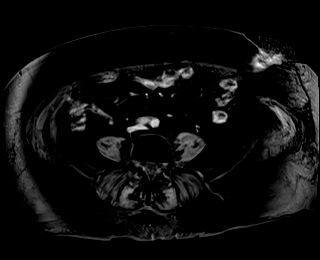

[Series 25: T1 fat-sat post-contrast · coronal · 6.0mm · 1.25mm/px · 1 of 30 slices shown (1 of 2)]
[im 1/30]
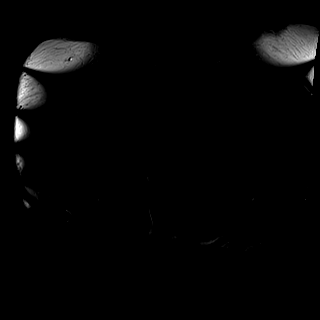

[Series 26: T1 fat-sat post-contrast · sagittal · 6.0mm · 1.25mm/px · 1 of 44 slices shown (2 of 2)]
[im 1/44]
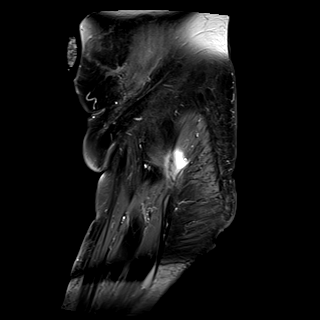

[48 of 48 positions shown; findings below may reference images not displayed]

FINDINGS: COMBINED FINDINGS FOR BOTH MR ABDOMEN AND PELVIS

Lower chest: Lung bases are clear.

Hepatobiliary: No focal hepatic lesion. No biliary duct dilatation.
Common bile duct is normal. Gallbladder normal

Pancreas: Pancreas is normal. No ductal dilatation. No pancreatic
inflammation.

Spleen: Normal spleen

Adrenals/urinary tract: Adrenal glands and kidneys are normal. The
ureters and bladder normal.

Stomach/Bowel: Stomach duodenum normal. Small fluid collection along
the third portion the duodenum measuring 1.9 cm (image [DATE] likely
represents diverticulum.

Small bowel and cecum normal. Ascending and transverse colon normal.
Descending colon exits a LEFT abdominal wall colostomy. No
complication. Rectal pouch appears normal. No abnormal anal tissue
enhancement. No lymph nodes in the mesorectal sheath. No pelvic
lymphadenopathy.

Vascular/Lymphatic: Abdominal aorta is normal caliber. No periportal
or retroperitoneal adenopathy. No pelvic adenopathy.

Reproductive: Post hysterectomy.  Adnexa unremarkable

Other: No peritoneal nodularity LEFT lower quadrant ostomy

Musculoskeletal: No aggressive osseous lesion.
IMPRESSION: 1. No evidence of local anal cancer recurrence.
2. No evidence of metastatic adenopathy in the pelvis or periaortic
retroperitoneum
3. LEFT lower quadrant colostomy without complication.
4. No hepatic metastasis.

## 2021-01-07 IMAGING — MR MR ABDOMEN WO/W CM
23 series · 48 of 48 positions shown · IV contrast (7.5ml Gadavist)
Comparison: None.

CLINICAL DATA: Renal cancer follow-up. End-stage renal disease. 7
mL Gadavist

EXAM:
MRI ABDOMEN AND PELVIS WITHOUT AND WITH CONTRAST
TECHNIQUE: Multiplanar multisequence MR imaging of the abdomen and pelvis was
performed both before and after the administration of intravenous
contrast.
CONTRAST:  7mL GADAVIST GADOBUTROL 1 MMOL/ML IV SOLN

[Series 4: T2 · coronal · 6.0mm · 1.19mm/px · 1 of 30 slices shown (1 of 5)]
[im 1/30]
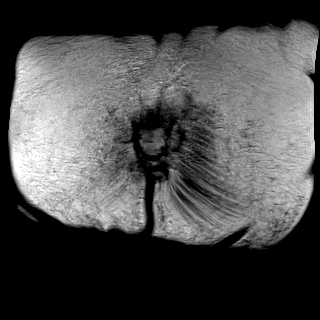

[Series 5: T2 · axial · 6.0mm · 1.19mm/px · 1 of 39 slices shown (2 of 5)]
[im 1/39]
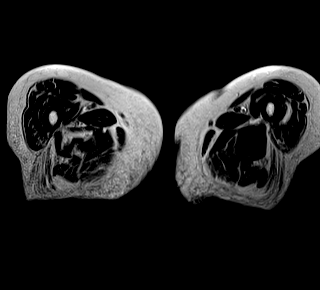

[Series 6: T2 · sagittal · 6.0mm · 1.19mm/px · 1 of 44 slices shown (3 of 5)]
[im 1/44]
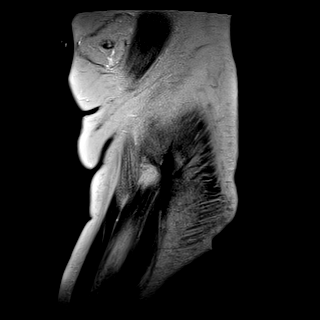

[Series 7: T1 · axial · 3.0mm · 1.19mm/px · z∈[-388,-127]mm · 3 of 88 slices shown (1 of 2)]
[im 1/88]
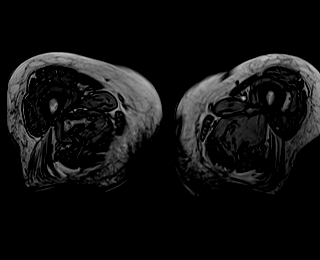
[im 44/88]
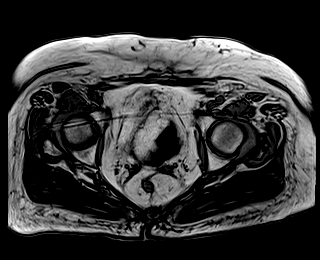
[im 88/88]
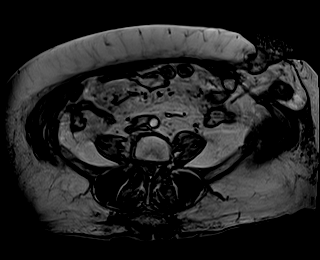

[Series 8: T1 fat-sat · axial · 3.0mm · 1.19mm/px · z∈[-315,-102]mm · 2 of 72 slices shown (1 of 2)]
[im 1/72]
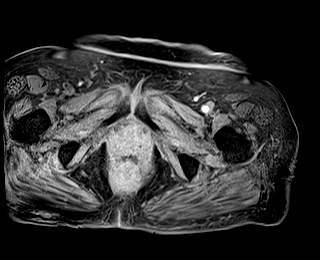
[im 72/72]
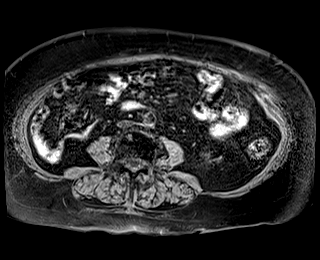

[Series 9: T2 · coronal · 6.0mm · 1.19mm/px · 1 of 34 slices shown (4 of 5)]
[im 1/34]
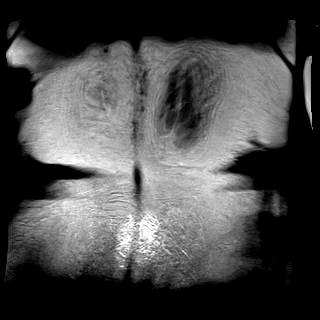

[Series 10: bSSFP · axial · 6.0mm · 0.74mm/px · 1 of 32 slices shown]
[im 1/32]
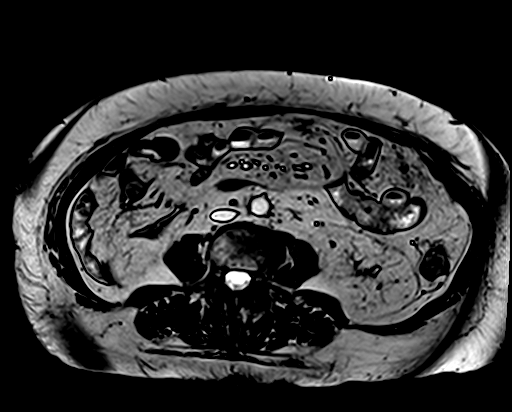

[Series 12: T2 fat-sat · axial · 6.0mm · 1.19mm/px · 1 of 34 slices shown]
[im 1/34]
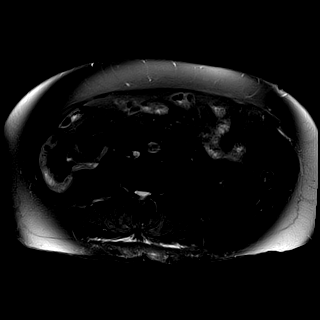

[Series 13: T1 · axial · 6.0mm · 0.74mm/px · z∈[-96,+127]mm · 2 of 64 slices shown (2 of 2)]
[im 1/64]
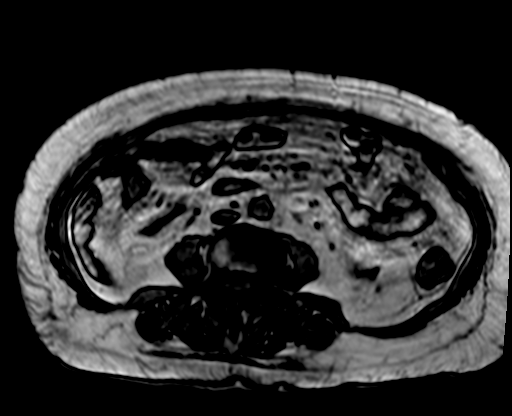
[im 64/64]
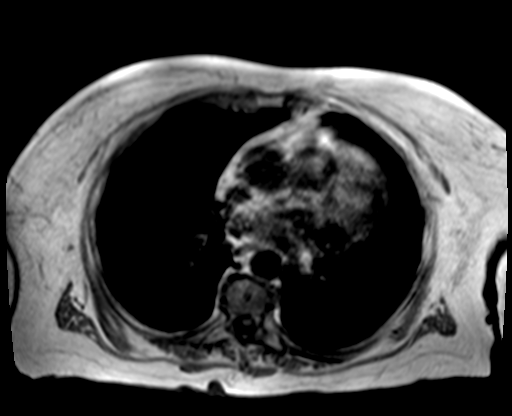

[Series 14: T2 · axial · 6.0mm · 1.19mm/px · 1 of 34 slices shown (5 of 5)]
[im 1/34]
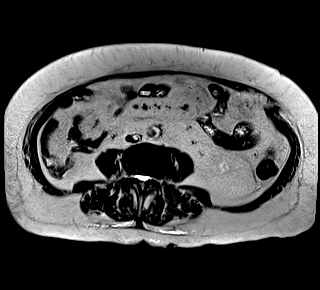

[Series 15: T1 dynamic fat-sat · axial · non-contrast · 3.2mm · 1.19mm/px · z∈[-111,+141]mm · 3 of 80 slices shown]
[im 1/80]
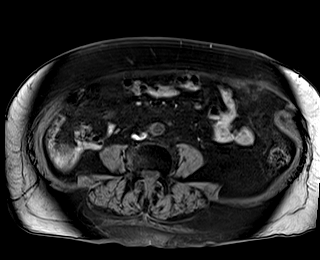
[im 40/80]
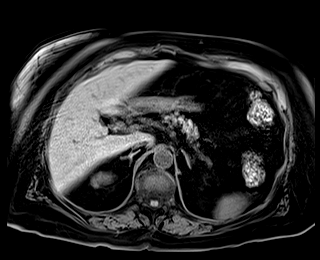
[im 80/80]
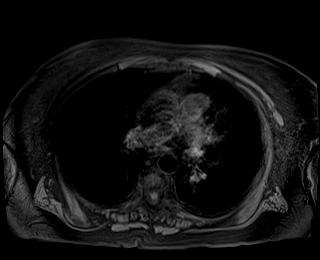

[Series 16: T1 dynamic fat-sat post-contrast · axial · 3.2mm · 1.19mm/px · z∈[-111,+141]mm · 3 of 80 slices shown (1 of 8)]
[im 1/80]
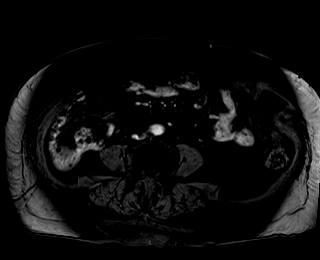
[im 40/80]
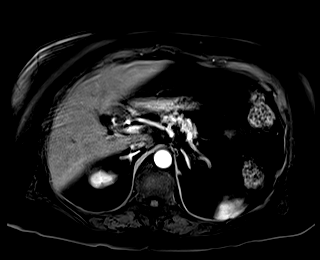
[im 80/80]
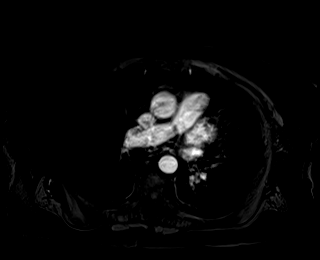

[Series 17: T1 dynamic fat-sat post-contrast · axial · 3.2mm · 1.19mm/px · z∈[-111,+141]mm · 3 of 80 slices shown (2 of 8)]
[im 1/80]
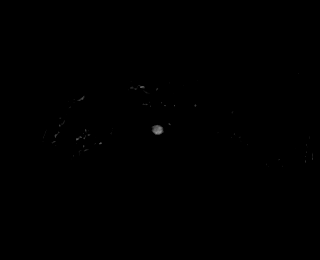
[im 40/80]
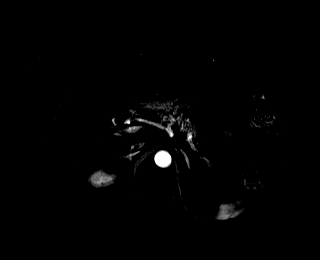
[im 80/80]
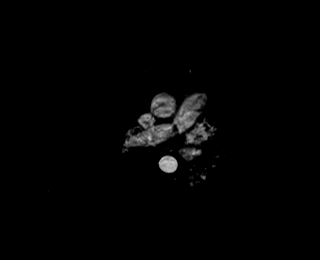

[Series 18: T1 dynamic fat-sat post-contrast · axial · 3.2mm · 1.19mm/px · z∈[-111,+141]mm · 3 of 80 slices shown (3 of 8)]
[im 1/80]
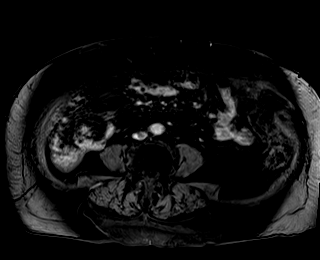
[im 40/80]
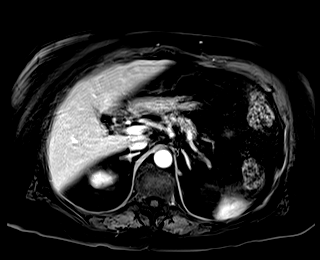
[im 80/80]
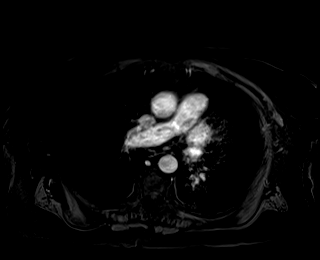

[Series 19: T1 dynamic fat-sat post-contrast · axial · 3.2mm · 1.19mm/px · z∈[-111,+141]mm · 3 of 80 slices shown (4 of 8)]
[im 1/80]
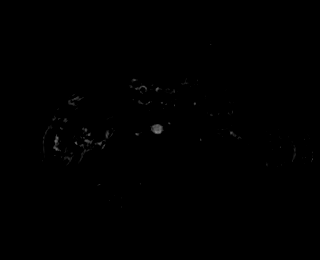
[im 40/80]
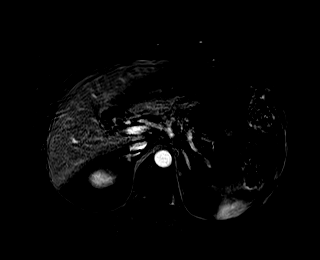
[im 80/80]
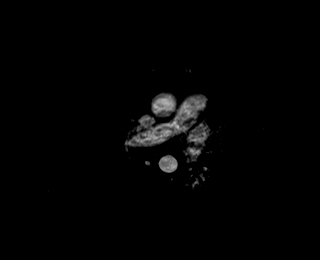

[Series 20: T1 dynamic fat-sat post-contrast · axial · 3.2mm · 1.19mm/px · z∈[-111,+141]mm · 3 of 80 slices shown (5 of 8)]
[im 1/80]
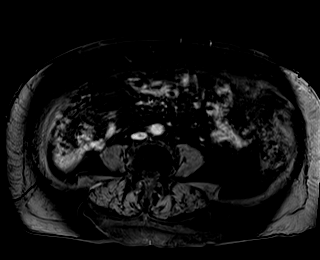
[im 40/80]
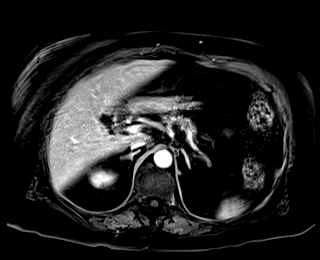
[im 80/80]
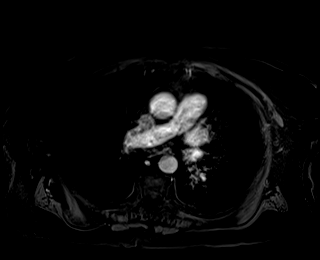

[Series 21: T1 dynamic fat-sat post-contrast · axial · 3.2mm · 1.19mm/px · z∈[-111,+141]mm · 3 of 80 slices shown (6 of 8)]
[im 1/80]
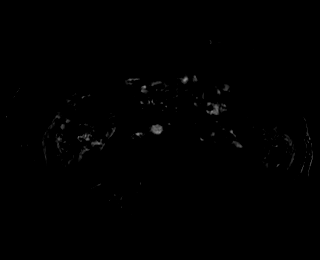
[im 40/80]
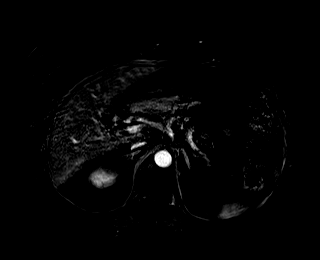
[im 80/80]
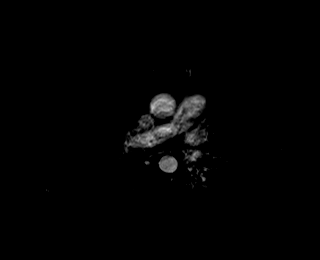

[Series 22: T1 dynamic post-contrast · coronal · 3.0mm · 1.31mm/px · 2 of 72 slices shown]
[im 1/72]
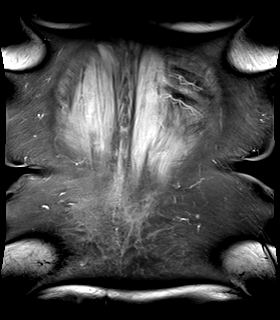
[im 72/72]
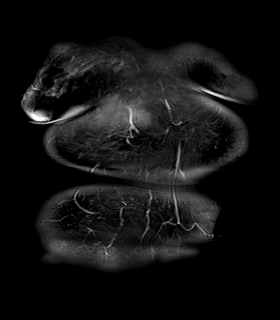

[Series 23: T1 dynamic fat-sat post-contrast · axial · 3.2mm · 1.19mm/px · z∈[-111,+141]mm · 3 of 80 slices shown (7 of 8)]
[im 1/80]
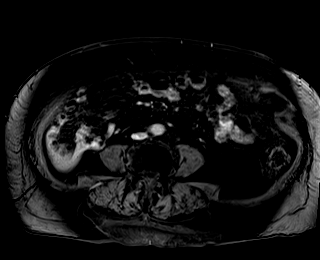
[im 40/80]
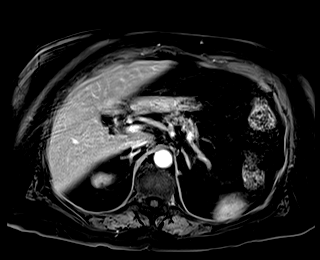
[im 80/80]
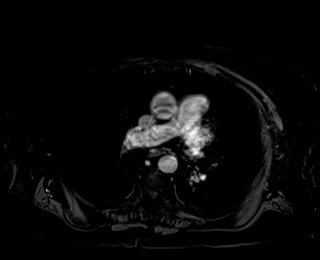

[Series 24: T1 dynamic fat-sat post-contrast · axial · 3.2mm · 1.19mm/px · z∈[-111,+141]mm · 3 of 80 slices shown (8 of 8)]
[im 1/80]
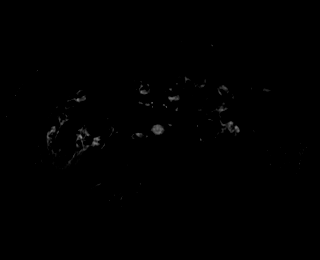
[im 40/80]
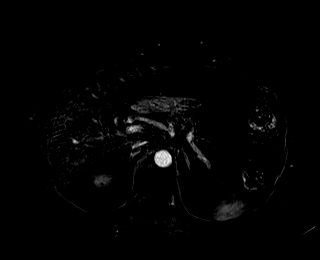
[im 80/80]
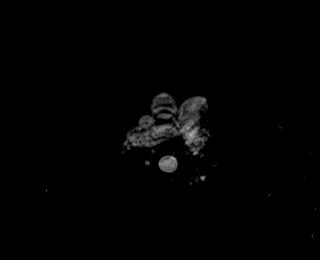

[Series 25: T1 fat-sat · axial · 3.0mm · 1.19mm/px · z∈[-388,-127]mm · 3 of 88 slices shown (2 of 2)]
[im 1/88]
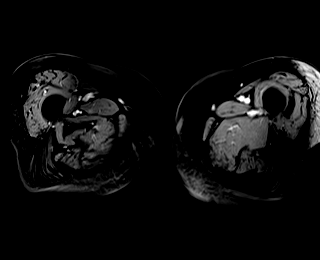
[im 44/88]
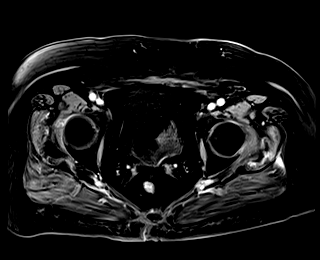
[im 88/88]
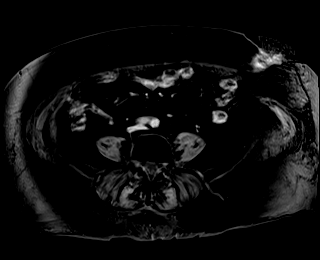

[Series 26: T1 fat-sat post-contrast · coronal · 6.0mm · 1.25mm/px · 1 of 30 slices shown (1 of 2)]
[im 1/30]
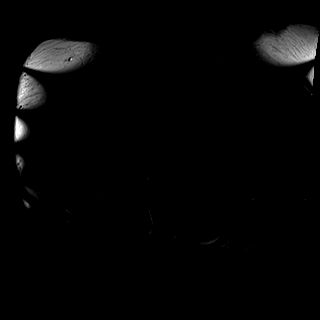

[Series 27: T1 fat-sat post-contrast · sagittal · 6.0mm · 1.25mm/px · 1 of 44 slices shown (2 of 2)]
[im 1/44]
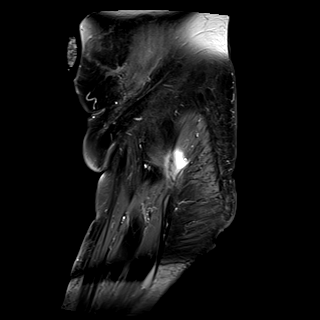

[48 of 48 positions shown; findings below may reference images not displayed]

FINDINGS: COMBINED FINDINGS FOR BOTH MR ABDOMEN AND PELVIS

Lower chest: Lung bases are clear.

Hepatobiliary: No focal hepatic lesion. No biliary duct dilatation.
Common bile duct is normal. Gallbladder normal

Pancreas: Pancreas is normal. No ductal dilatation. No pancreatic
inflammation.

Spleen: Normal spleen

Adrenals/urinary tract: Adrenal glands and kidneys are normal. The
ureters and bladder normal.

Stomach/Bowel: Stomach duodenum normal. Small fluid collection along
the third portion the duodenum measuring 1.9 cm (image [DATE] likely
represents diverticulum.

Small bowel and cecum normal. Ascending and transverse colon normal.
Descending colon exits a LEFT abdominal wall colostomy. No
complication. Rectal pouch appears normal. No abnormal anal tissue
enhancement. No lymph nodes in the mesorectal sheath. No pelvic
lymphadenopathy.

Vascular/Lymphatic: Abdominal aorta is normal caliber. No periportal
or retroperitoneal adenopathy. No pelvic adenopathy.

Reproductive: Post hysterectomy.  Adnexa unremarkable

Other: No peritoneal nodularity LEFT lower quadrant ostomy

Musculoskeletal: No aggressive osseous lesion.
IMPRESSION: 1. No evidence of local anal cancer recurrence.
2. No evidence of metastatic adenopathy in the pelvis or periaortic
retroperitoneum
3. LEFT lower quadrant colostomy without complication.
4. No hepatic metastasis.

## 2021-01-07 MED ORDER — GADOBUTROL 1 MMOL/ML IV SOLN
7.0000 mL | Freq: Once | INTRAVENOUS | Status: AC | PRN
  Administered 2021-01-07: 7 mL via INTRAVENOUS

## 2021-01-09 ENCOUNTER — Encounter: Payer: Self-pay | Admitting: Oncology

## 2021-01-09 ENCOUNTER — Inpatient Hospital Stay: Payer: PPO | Admitting: Oncology

## 2021-01-09 VITALS — BP 103/73 | HR 80 | Temp 97.5°F | Resp 18 | Wt 172.1 lb

## 2021-01-09 DIAGNOSIS — E538 Deficiency of other specified B group vitamins: Secondary | ICD-10-CM | POA: Diagnosis not present

## 2021-01-09 DIAGNOSIS — R911 Solitary pulmonary nodule: Secondary | ICD-10-CM | POA: Diagnosis not present

## 2021-01-09 DIAGNOSIS — N1831 Chronic kidney disease, stage 3a: Secondary | ICD-10-CM | POA: Diagnosis not present

## 2021-01-09 DIAGNOSIS — Z85048 Personal history of other malignant neoplasm of rectum, rectosigmoid junction, and anus: Secondary | ICD-10-CM | POA: Diagnosis not present

## 2021-01-09 DIAGNOSIS — D508 Other iron deficiency anemias: Secondary | ICD-10-CM

## 2021-01-09 DIAGNOSIS — D631 Anemia in chronic kidney disease: Secondary | ICD-10-CM | POA: Diagnosis not present

## 2021-01-09 DIAGNOSIS — D649 Anemia, unspecified: Secondary | ICD-10-CM | POA: Insufficient documentation

## 2021-01-09 MED ORDER — IRON-VITAMIN C 65-125 MG PO TABS: 1.0000 | ORAL_TABLET | Freq: Every day | ORAL | 3 refills | Status: DC

## 2021-01-09 NOTE — Progress Notes (Signed)
Pt here for follow up. No new concerns voiced.   

## 2021-01-09 NOTE — Progress Notes (Signed)
Hematology/Oncology progress note [facility] Telephone:([phone] [phone]   Patient Care Team: Maryland Pink, MD as PCP - General (Family Medicine)  REFERRING PROVIDER: Maryland Pink, MD  CHIEF COMPLAINTS/REASON FOR VISIT:  Follow up for history of anal cancer, B12 deficiency, anemia due to CKD, iron deficiency  HISTORY OF PRESENTING ILLNESS:   Sarah Potts is a  87 y.o.  female with PMH listed below was seen in consultation at the request of  Maryland Pink, MD  for evaluation of history of anal cancer  Patient reports history of anal cancer in 2017. I obtained previous oncology records and extensive medical record review was performed by me  Patient has noticed a lesion around her anus for about 2 years prior to seeking medical attention.  The lesion was mostly exophytic and about 4 cm in greatest dimension.  The lesion was initially felt to be confined to the perianal skin. 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  Associated heavy inflammatory infiltrate present at the base of the tumor.  Tumor does not extend to inked deep surgical margin.  Patient was seen by radiation oncology, clinical staging was T2N0 M0 and received adjuvant radiation from 7/272017-11/11/2015.  11/22/2015, PET scan showed hypermetabolic activity in the anus consistent with patient's history of anal cancer with metastatic and right iliac chain/inguinal adenopathy.  02/05/2016.  Patient finished an additional radiation treatments with Dr. Aline Brochure with attention on just to the right inguinal nodes as well as the primary lesion.  05/30/2016, follow-up PET scan showed resolution of previously noted hypermetabolic inguinal lymph node adenopathy.  Physiological bowel uptake making it difficult to tell whether the residual uptake within the rectum is physiological or residual tumor.  No  waning uptake is identified.  Continued follow-up is recommended.  03/17/2018, bone scan showed focal uptake identified involving the right first rib anteriorly at approximately the fifth rib. Patient's pain is primarily in her spine.  No point tenderness over her ribs. 03/24/2018, MRI cervical thoracic lumbar spine showed no metastatic lesion.  Patient has spondylosis.  Patient reports that she had some image scan done in 2020-not available in current medical records that we obtained..  No images done in 2021.  She recalls that she got chemotherapy.-No records available at this point.  Denies any rectal bleeding or discomfort.  Sarah Potts is a 87 y.o. female who has above history reviewed by me today presents for follow up visit for anal cancer, iron deficiency anemia, B12 deficiency,  She had blood work done as well CT/MRI images. Presents to discuss results. No new complaints.     Review of Systems  Constitutional:  Negative for appetite change, chills, fatigue and fever.  HENT:   Negative for hearing loss and voice change.   Eyes:  Negative for eye problems.  Respiratory:  Negative for chest tightness and cough.   Cardiovascular:  Negative for chest pain.  Gastrointestinal:  Negative for abdominal distention, abdominal pain and blood in stool.  Endocrine: Negative for hot flashes.  Genitourinary:  Negative for difficulty urinating and frequency.   Musculoskeletal:  Negative for arthralgias.  Skin:  Negative for itching and rash.  Neurological:  Negative for extremity weakness.  Hematological:  Negative for adenopathy.  Psychiatric/Behavioral:  Negative for confusion.    MEDICAL HISTORY:  Past Medical History:  Diagnosis Date   Diabetes mellitus    Hyperlipidemia    Hypertension     SURGICAL  HISTORY: Past Surgical History:  Procedure Laterality Date   BLADDER SURGERY     CARDIAC CATHETERIZATION     heart stent     TONSILLECTOMY     uterus surgery      removed    SOCIAL HISTORY: Social History   Socioeconomic History   Marital status: Single    Spouse name: Not on file   Number of children: Not on file   Years of education: Not on file   Highest education level: Not on file  Occupational History   Not on file  Tobacco Use   Smoking status: Never   Smokeless tobacco: Not on file  Substance and Sexual Activity   Alcohol use: No   Drug use: No   Sexual activity: Not on file  Other Topics Concern   Not on file  Social History Narrative   Not on file   Social Determinants of Health   Financial Resource Strain: Not on file  Food Insecurity: Not on file  Transportation Needs: Not on file  Physical Activity: Not on file  Stress: Not on file  Social Connections: Not on file  Intimate Partner Violence: Not on file    FAMILY HISTORY: Family History  Problem Relation Age of Onset   Cancer Mother    Breast cancer Mother    Heart disease Father    Heart attack Father    Cancer Sister    Heart disease Sister    Cervical cancer Sister    Heart disease Brother     ALLERGIES:  is allergic to levofloxacin and niacin and related.  MEDICATIONS:  Current Outpatient Medications  Medication Sig Dispense Refill   amLODipine (NORVASC) 5 MG tablet Take by mouth.     atorvastatin (LIPITOR) 20 MG tablet Take by mouth.     Cholecalciferol 25 MCG (1000 UT) tablet Take by mouth. Pt takes 3 daily     ELIQUIS 2.5 MG TABS tablet Take 2.5 mg by mouth 2 (two) times daily.     ezetimibe (ZETIA) 10 MG tablet Take 1 tablet by mouth daily.     Iron-Vitamin C 65-125 MG TABS Take 1 tablet by mouth daily. 30 tablet 3   losartan (COZAAR) 100 MG tablet Take 1 tablet by mouth daily.     magnesium oxide (MAG-OX) 400 MG tablet Take by mouth.     metFORMIN (GLUMETZA) 1000 MG (MOD) 24 hr tablet Take 1,000 mg by mouth 2 (two) times daily with a meal.       metoprolol tartrate (LOPRESSOR) 50 MG tablet Take 1 tablet by mouth 2 (two) times daily.      mirabegron ER (MYRBETRIQ) 50 MG TB24 tablet Take by mouth.     montelukast (SINGULAIR) 10 MG tablet Take 10 mg by mouth at bedtime.       omega-3 acid ethyl esters (LOVAZA) 1 g capsule Take 2 g by mouth 2 (two) times daily.       omeprazole (PRILOSEC) 40 MG capsule Take 40 mg by mouth daily.       pregabalin (LYRICA) 50 MG capsule Take 50 mg by mouth 3 (three) times daily.     sertraline (ZOLOFT) 50 MG tablet Take 1 tablet by mouth daily. Pt taking 1/2 daily     ALPRAZolam (XANAX) 0.5 MG tablet Take 1 tablet (0.5 mg total) by mouth See admin instructions. Take 1 tablet prior to MRI appointment. You may repeat another dose. thanks (Patient not taking: Reported on 01/09/2021) 2 tablet 0  NITROSTAT 0.4 MG SL tablet Place 0.4 mg under the tongue every 5 (five) minutes as needed.  (Patient not taking: Reported on 3/47/4259)     TRULICITY 1.5 DG/3.8VF SOPN Inject into the skin once a week.     No current facility-administered medications for this visit.     PHYSICAL EXAMINATION: ECOG PERFORMANCE STATUS: 1 - Symptomatic but completely ambulatory Vitals:   01/09/21 1446  BP: 103/73  Pulse: 80  Resp: 18  Temp: (!) 97.5 F (36.4 C)  SpO2: 99%   Filed Weights   01/09/21 1446  Weight: 172 lb 1.6 oz (78.1 kg)    Physical Exam Constitutional:      General: She is not in acute distress. HENT:     Head: Normocephalic and atraumatic.  Eyes:     General: No scleral icterus. Cardiovascular:     Rate and Rhythm: Normal rate and regular rhythm.     Heart sounds: Normal heart sounds.  Pulmonary:     Effort: Pulmonary effort is normal. No respiratory distress.     Breath sounds: No wheezing.  Abdominal:     General: Bowel sounds are normal. There is no distension.     Palpations: Abdomen is soft.  Musculoskeletal:        General: No deformity. Normal range of motion.     Cervical back: Normal range of motion and neck supple.  Skin:    General: Skin is warm and dry.     Findings: No  erythema or rash.  Neurological:     Mental Status: She is alert and oriented to person, place, and time. Mental status is at baseline.     Cranial Nerves: No cranial nerve deficit.     Coordination: Coordination normal.  Psychiatric:        Mood and Affect: Mood normal.    LABORATORY DATA:  I have reviewed the data as listed Lab Results  Component Value Date   WBC 5.1 12/20/2020   HGB 10.5 (L) 12/20/2020   HCT 31.4 (L) 12/20/2020   MCV 95.4 12/20/2020   PLT 224 12/20/2020   Recent Labs    12/20/20 1342  NA 133*  K 4.5  CL 102  CO2 23  GLUCOSE 135*  BUN 17  CREATININE 1.14*  CALCIUM 8.7*  GFRNONAA 47*  PROT 6.6  ALBUMIN 3.6  AST 19  ALT 13  ALKPHOS 42  BILITOT 0.7   Iron/TIBC/Ferritin/ %Sat    Component Value Date/Time   IRON 71 12/26/2020 1335   TIBC 335 12/26/2020 1335   FERRITIN 13 12/26/2020 1335   IRONPCTSAT 21 12/26/2020 1335      RADIOGRAPHIC STUDIES: I have personally reviewed the radiological images as listed and agreed with the findings in the report. CT CHEST WO CONTRAST  Result Date: 01/07/2021 CLINICAL DATA:  Anal cancer.  Surveillance exam EXAM: CT CHEST WITHOUT CONTRAST TECHNIQUE: Multidetector CT imaging of the chest was performed following the standard protocol without IV contrast. COMPARISON:  None. FINDINGS: Cardiovascular: Coronary artery calcification and aortic atherosclerotic calcification. Mediastinum/Nodes: No axillary or supraclavicular adenopathy. No mediastinal or hilar adenopathy. No pericardial fluid. Esophagus normal. Lungs/Pleura: RIGHT lobe pulmonary nodule measures 6 mm (image 62/4). RIGHT lower lobe nodule measures 4 mm (image 60/4) Smaller 3 mm nodule in the LEFT lower lobe on image 113/4). Upper Abdomen: Limited view of the liver, kidneys, pancreas are unremarkable. Normal adrenal glands. Musculoskeletal: No aggressive osseous lesion. Degenerative osteophytosis of the spine. IMPRESSION: 1. Rounded pulmonary nodule in the RIGHT  lower lobe measuring 6 mm warrants follow-up. Recommend CT thorax in 3 months. 2. No mediastinal lymphadenopathy. Electronically Signed   By: Suzy Bouchard M.D.   On: 01/07/2021 21:23   MR PELVIS W WO CONTRAST  Result Date: 01/07/2021 CLINICAL DATA:  Renal cancer follow-up. End-stage renal disease. 7 mL Gadavist EXAM: MRI ABDOMEN AND PELVIS WITHOUT AND WITH CONTRAST TECHNIQUE: Multiplanar multisequence MR imaging of the abdomen and pelvis was performed both before and after the administration of intravenous contrast. CONTRAST:  63mL GADAVIST GADOBUTROL 1 MMOL/ML IV SOLN COMPARISON:  None. FINDINGS: COMBINED FINDINGS FOR BOTH MR ABDOMEN AND PELVIS Lower chest: Lung bases are clear. Hepatobiliary: No focal hepatic lesion. No biliary duct dilatation. Common bile duct is normal. Gallbladder normal Pancreas: Pancreas is normal. No ductal dilatation. No pancreatic inflammation. Spleen: Normal spleen Adrenals/urinary tract: Adrenal glands and kidneys are normal. The ureters and bladder normal. Stomach/Bowel: Stomach duodenum normal. Small fluid collection along the third portion the duodenum measuring 1.9 cm (image 27/12 likely represents diverticulum. Small bowel and cecum normal. Ascending and transverse colon normal. Descending colon exits a LEFT abdominal wall colostomy. No complication. Rectal pouch appears normal. No abnormal anal tissue enhancement. No lymph nodes in the mesorectal sheath. No pelvic lymphadenopathy. Vascular/Lymphatic: Abdominal aorta is normal caliber. No periportal or retroperitoneal adenopathy. No pelvic adenopathy. Reproductive: Post hysterectomy.  Adnexa unremarkable Other: No peritoneal nodularity LEFT lower quadrant ostomy Musculoskeletal: No aggressive osseous lesion. IMPRESSION: 1. No evidence of local anal cancer recurrence. 2. No evidence of metastatic adenopathy in the pelvis or periaortic retroperitoneum 3. LEFT lower quadrant colostomy without complication. 4. No hepatic  metastasis. Electronically Signed   By: Suzy Bouchard M.D.   On: 01/07/2021 21:17   MR Abdomen W Wo Contrast  Result Date: 01/07/2021 CLINICAL DATA:  Renal cancer follow-up. End-stage renal disease. 7 mL Gadavist EXAM: MRI ABDOMEN AND PELVIS WITHOUT AND WITH CONTRAST TECHNIQUE: Multiplanar multisequence MR imaging of the abdomen and pelvis was performed both before and after the administration of intravenous contrast. CONTRAST:  48mL GADAVIST GADOBUTROL 1 MMOL/ML IV SOLN COMPARISON:  None. FINDINGS: COMBINED FINDINGS FOR BOTH MR ABDOMEN AND PELVIS Lower chest: Lung bases are clear. Hepatobiliary: No focal hepatic lesion. No biliary duct dilatation. Common bile duct is normal. Gallbladder normal Pancreas: Pancreas is normal. No ductal dilatation. No pancreatic inflammation. Spleen: Normal spleen Adrenals/urinary tract: Adrenal glands and kidneys are normal. The ureters and bladder normal. Stomach/Bowel: Stomach duodenum normal. Small fluid collection along the third portion the duodenum measuring 1.9 cm (image 27/12 likely represents diverticulum. Small bowel and cecum normal. Ascending and transverse colon normal. Descending colon exits a LEFT abdominal wall colostomy. No complication. Rectal pouch appears normal. No abnormal anal tissue enhancement. No lymph nodes in the mesorectal sheath. No pelvic lymphadenopathy. Vascular/Lymphatic: Abdominal aorta is normal caliber. No periportal or retroperitoneal adenopathy. No pelvic adenopathy. Reproductive: Post hysterectomy.  Adnexa unremarkable Other: No peritoneal nodularity LEFT lower quadrant ostomy Musculoskeletal: No aggressive osseous lesion. IMPRESSION: 1. No evidence of local anal cancer recurrence. 2. No evidence of metastatic adenopathy in the pelvis or periaortic retroperitoneum 3. LEFT lower quadrant colostomy without complication. 4. No hepatic metastasis. Electronically Signed   By: Suzy Bouchard M.D.   On: 01/07/2021 21:17      ASSESSMENT &  PLAN:  1. Other iron deficiency anemia   2. Lung nodule   3. Iron deficiency anemia, unspecified iron deficiency anemia type   4. B12 deficiency   5. Stage 3a chronic kidney disease ([place])    #  History of anal cancer in 2017. Per record and per history provided by patient, patient is status post concurrent chemoradiation. MRI abdomen and pelvis showed no recurrence.   # Lung nodule CT chest wo contrast in 3 months.   # B12 deficiency Recommend patient to start daily B12 x 5 followed by weekly B12 x 4.  Check intrinsic antibody and anti parietal antibody.   #  anemia due to CKD. SPEP is negative.  Iron saturation is 13, borderline. In the context of CKD, recommend to further improve iron store.  Recommend patient to see GI for colonoscopy.  Recommend Vitron C once daily.    Orders Placed This Encounter  Procedures   CT Chest Wo Contrast    Standing Status:   Future    Standing Expiration Date:   01/09/2022    Order Specific Question:   Preferred imaging location?    Answer:   [facility]   CBC with Differential/Platelet    Standing Status:   Future    Standing Expiration Date:   01/09/2022   Ferritin    Standing Status:   Future    Standing Expiration Date:   01/09/2022   Iron and TIBC    Standing Status:   Future    Standing Expiration Date:   01/09/2022   Vitamin B12    Standing Status:   Future    Standing Expiration Date:   01/09/2022   Ambulatory referral to Gastroenterology    Referral Priority:   Routine    Referral Type:   Consultation    Referral Reason:   Specialty Services Required    Referred to Provider:   Efrain Sella, MD    Number of Visits Requested:   1    All questions were answered. The patient knows to call the clinic with any problems questions or concerns.  cc Maryland Pink, MD    [name], MD, PhD 01/09/2021

## 2021-01-09 NOTE — Addendum Note (Signed)
Addended by: [name] on: 01/09/2021 10:53 PM   Modules accepted: Orders

## 2021-01-14 ENCOUNTER — Other Ambulatory Visit: Payer: Self-pay

## 2021-01-14 ENCOUNTER — Inpatient Hospital Stay: Payer: PPO | Attending: Oncology

## 2021-01-14 DIAGNOSIS — Z85048 Personal history of other malignant neoplasm of rectum, rectosigmoid junction, and anus: Secondary | ICD-10-CM | POA: Diagnosis not present

## 2021-01-14 DIAGNOSIS — E538 Deficiency of other specified B group vitamins: Secondary | ICD-10-CM | POA: Diagnosis not present

## 2021-01-14 MED ORDER — CYANOCOBALAMIN 1000 MCG/ML IJ SOLN
1000.0000 ug | Freq: Once | INTRAMUSCULAR | Status: AC
  Administered 2021-01-14: 1000 ug via INTRAMUSCULAR
  Filled 2021-01-14: qty 1

## 2021-01-15 ENCOUNTER — Inpatient Hospital Stay: Payer: PPO

## 2021-01-15 DIAGNOSIS — E538 Deficiency of other specified B group vitamins: Secondary | ICD-10-CM | POA: Diagnosis not present

## 2021-01-15 MED ORDER — CYANOCOBALAMIN 1000 MCG/ML IJ SOLN
1000.0000 ug | Freq: Once | INTRAMUSCULAR | Status: AC
  Administered 2021-01-15: 1000 ug via INTRAMUSCULAR
  Filled 2021-01-15: qty 1

## 2021-01-16 ENCOUNTER — Other Ambulatory Visit: Payer: Self-pay

## 2021-01-16 ENCOUNTER — Inpatient Hospital Stay: Payer: PPO

## 2021-01-16 DIAGNOSIS — E538 Deficiency of other specified B group vitamins: Secondary | ICD-10-CM

## 2021-01-16 MED ORDER — CYANOCOBALAMIN 1000 MCG/ML IJ SOLN
1000.0000 ug | Freq: Once | INTRAMUSCULAR | Status: AC
  Administered 2021-01-16: 1000 ug via INTRAMUSCULAR
  Filled 2021-01-16: qty 1

## 2021-01-17 ENCOUNTER — Inpatient Hospital Stay: Payer: PPO

## 2021-01-17 DIAGNOSIS — E538 Deficiency of other specified B group vitamins: Secondary | ICD-10-CM | POA: Diagnosis not present

## 2021-01-17 MED ORDER — CYANOCOBALAMIN 1000 MCG/ML IJ SOLN
1000.0000 ug | Freq: Once | INTRAMUSCULAR | Status: AC
  Administered 2021-01-17: 1000 ug via INTRAMUSCULAR
  Filled 2021-01-17: qty 1

## 2021-01-18 ENCOUNTER — Inpatient Hospital Stay: Payer: PPO

## 2021-01-18 ENCOUNTER — Other Ambulatory Visit: Payer: Self-pay

## 2021-01-18 DIAGNOSIS — E538 Deficiency of other specified B group vitamins: Secondary | ICD-10-CM

## 2021-01-18 MED ORDER — CYANOCOBALAMIN 1000 MCG/ML IJ SOLN
1000.0000 ug | Freq: Once | INTRAMUSCULAR | Status: AC
  Administered 2021-01-18: 1000 ug via INTRAMUSCULAR
  Filled 2021-01-18: qty 1

## 2021-01-21 ENCOUNTER — Other Ambulatory Visit: Payer: Self-pay

## 2021-01-21 ENCOUNTER — Inpatient Hospital Stay: Payer: PPO

## 2021-01-21 DIAGNOSIS — E538 Deficiency of other specified B group vitamins: Secondary | ICD-10-CM | POA: Diagnosis not present

## 2021-01-21 MED ORDER — CYANOCOBALAMIN 1000 MCG/ML IJ SOLN
1000.0000 ug | Freq: Once | INTRAMUSCULAR | Status: AC
  Administered 2021-01-21: 1000 ug via INTRAMUSCULAR
  Filled 2021-01-21: qty 1

## 2021-01-22 DIAGNOSIS — G4700 Insomnia, unspecified: Secondary | ICD-10-CM | POA: Diagnosis not present

## 2021-01-22 DIAGNOSIS — E1159 Type 2 diabetes mellitus with other circulatory complications: Secondary | ICD-10-CM | POA: Diagnosis not present

## 2021-01-22 DIAGNOSIS — G609 Hereditary and idiopathic neuropathy, unspecified: Secondary | ICD-10-CM | POA: Diagnosis not present

## 2021-01-22 DIAGNOSIS — E785 Hyperlipidemia, unspecified: Secondary | ICD-10-CM | POA: Diagnosis not present

## 2021-01-22 DIAGNOSIS — I10 Essential (primary) hypertension: Secondary | ICD-10-CM | POA: Diagnosis not present

## 2021-01-22 DIAGNOSIS — E538 Deficiency of other specified B group vitamins: Secondary | ICD-10-CM | POA: Diagnosis not present

## 2021-01-22 DIAGNOSIS — N1832 Chronic kidney disease, stage 3b: Secondary | ICD-10-CM | POA: Diagnosis not present

## 2021-01-22 DIAGNOSIS — D649 Anemia, unspecified: Secondary | ICD-10-CM | POA: Diagnosis not present

## 2021-01-22 DIAGNOSIS — I25119 Atherosclerotic heart disease of native coronary artery with unspecified angina pectoris: Secondary | ICD-10-CM | POA: Diagnosis not present

## 2021-01-28 ENCOUNTER — Inpatient Hospital Stay: Payer: PPO

## 2021-01-29 ENCOUNTER — Ambulatory Visit: Payer: PPO | Admitting: Cardiovascular Disease

## 2021-02-04 ENCOUNTER — Other Ambulatory Visit: Payer: Self-pay

## 2021-02-04 ENCOUNTER — Inpatient Hospital Stay: Payer: PPO

## 2021-02-04 DIAGNOSIS — E538 Deficiency of other specified B group vitamins: Secondary | ICD-10-CM

## 2021-02-04 MED ORDER — CYANOCOBALAMIN 1000 MCG/ML IJ SOLN
1000.0000 ug | Freq: Once | INTRAMUSCULAR | Status: AC
  Administered 2021-02-04: 1000 ug via INTRAMUSCULAR
  Filled 2021-02-04: qty 1

## 2021-02-04 NOTE — Progress Notes (Signed)
Cardiology Office Note  Date:  02/05/2021   ID:  Janiyah, Beery 29-Nov-1932, MRN [id number]  PCP:  Maryland Pink, MD   Chief Complaint  Patient presents with   New Patient (Initial Visit)    Ref by Dr. Kary Kos to establish care for a history of CAD. Medications reviewed by the patient verbally.     HPI:  Ms. Elms is a very pleasant 87 year old woman with a history of  coronary artery disease diagnosed by cardiac catheterization in 2006,  PCI diabetes,  hypertension,  hyperlipidemia  Anal cancer Atrial fibrillation 2016 in [place] who presents for new patient evaluation cad, PCI in 2019  Last seen by myself in 2014 Moved to [place] to be with her daughter Following [place] moved to [place] now back to [facility] independently in an apartment, no local family  03/25/18 had chest pain while living in [place] N/V on arrival, suspected non-STEMI, taken to the catheterization lab had stent placed, [employer]. Synergy 2.5 x 16 mm to LCX Records have been requested  Denies any significant angina since that time  While living in [place], timing unclear, reports developing tachypalpitations, seen in the hospital Diagnosed with atrial fibrillation, started on Eliquis and metoprolol Records have been requested  No regular exercise program Does not take her evening medications including her Eliquis, metoprolol among others, only remembers to take her morning pills  EKG personally reviewed by myself on todays visit NSR rate left bundle branch block rate 73 bpm   PMH:   has a past medical history of Anal cancer ([place]), Diabetes mellitus, Hyperlipidemia, and Hypertension.  PSH:    Past Surgical History:  Procedure Laterality Date   BLADDER SURGERY     CARDIAC CATHETERIZATION     COLOSTOMY     CORONARY ANGIOPLASTY WITH STENT PLACEMENT  03/25/2018   Stent to the LCX  2.50 mm x 78mm  [employer] REF [id number] LOT [id number]   heart stent      TONSILLECTOMY     uterus surgery     removed    Current Outpatient Medications  Medication Sig Dispense Refill   ALPRAZolam (XANAX) 0.5 MG tablet Take 1 tablet (0.5 mg total) by mouth See admin instructions. Take 1 tablet prior to MRI appointment. You may repeat another dose. thanks 2 tablet 0   amLODipine (NORVASC) 5 MG tablet Take by mouth.     atorvastatin (LIPITOR) 20 MG tablet Take by mouth.     Cholecalciferol 25 MCG (1000 UT) tablet Take by mouth. Pt takes 3 daily     ELIQUIS 2.5 MG TABS tablet Take 2.5 mg by mouth 2 (two) times daily.     ezetimibe (ZETIA) 10 MG tablet Take 1 tablet by mouth daily.     Iron-Vitamin C 65-125 MG TABS Take 1 tablet by mouth daily. 30 tablet 3   losartan (COZAAR) 100 MG tablet Take 1 tablet by mouth daily.     magnesium oxide (MAG-OX) 400 MG tablet Take by mouth.     metFORMIN (GLUMETZA) 1000 MG (MOD) 24 hr tablet Take 1,000 mg by mouth 2 (two) times daily with a meal.       metoprolol tartrate (LOPRESSOR) 50 MG tablet Take 1 tablet by mouth 2 (two) times daily.     mirabegron ER (MYRBETRIQ) 50 MG TB24 tablet Take by mouth.     montelukast (SINGULAIR) 10 MG tablet Take 10 mg by mouth at bedtime.       nitroGLYCERIN (NITRODUR -  DOSED IN MG/24 HR) 0.2 mg/hr patch PLEASE SEE ATTACHED FOR DETAILED DIRECTIONS     omega-3 acid ethyl esters (LOVAZA) 1 g capsule Take 2 g by mouth 2 (two) times daily.       omeprazole (PRILOSEC) 40 MG capsule Take 40 mg by mouth daily.       pregabalin (LYRICA) 75 MG capsule Take 75 mg by mouth 2 (two) times daily.     sertraline (ZOLOFT) 50 MG tablet Take 1 tablet by mouth daily. Pt taking 1/2 daily     traZODone (DESYREL) 50 MG tablet Take 1 tablet by mouth at bedtime.     TRULICITY 1.5 SJ/6.2EZ SOPN Inject into the skin once a week.     pregabalin (LYRICA) 50 MG capsule Take 50 mg by mouth 3 (three) times daily. (Patient not taking: Reported on 02/05/2021)     No current facility-administered medications for this  visit.    Allergies:   Levofloxacin, Niacin, and Niacin and related   Social History:  The patient  reports that she has never smoked. She has never used smokeless tobacco. She reports that she does not drink alcohol and does not use drugs.   Family History:   family history includes Breast cancer in her mother; Cancer in her mother and sister; Cervical cancer in her sister; Heart attack in her father; Heart disease in her brother, father, and sister.    Review of Systems: Review of Systems  Constitutional: Negative.   HENT: Negative.    Respiratory: Negative.    Cardiovascular: Negative.   Gastrointestinal: Negative.   Musculoskeletal: Negative.   Neurological: Negative.   Psychiatric/Behavioral: Negative.    All other systems reviewed and are negative.   PHYSICAL EXAM: VS:  BP 120/70 (BP Location: Right Arm, Patient Position: Sitting, Cuff Size: Normal)   Pulse 73   Ht 5\' 2"  (1.575 m)   Wt 171 lb (77.6 kg)   SpO2 98%   BMI 31.28 kg/m  , BMI Body mass index is 31.28 kg/m. GEN: Well nourished, well developed, in no acute distress HEENT: normal Neck: no JVD, carotid bruits, or masses Cardiac: RRR; no murmurs, rubs, or gallops,no edema  Respiratory:  clear to auscultation bilaterally, normal work of breathing GI: soft, nontender, nondistended, + BS MS: no deformity or atrophy Skin: warm and dry, no rash Neuro:  Strength and sensation are intact Psych: euthymic mood, full affect   Recent Labs: 12/20/2020: ALT 13; BUN 17; Creatinine, Ser 1.14; Hemoglobin 10.5; Platelets 224; Potassium 4.5; Sodium 133    Lipid Panel No results found for: CHOL, HDL, LDLCALC, TRIG    Wt Readings from Last 3 Encounters:  02/05/21 171 lb (77.6 kg)  01/09/21 172 lb 1.6 oz (78.1 kg)  12/11/20 165 lb (74.8 kg)     ASSESSMENT AND PLAN:  Problem List Items Addressed This Visit       Cardiology Problems   CAD (coronary artery disease) - Primary   Relevant Medications   nitroGLYCERIN  (NITRODUR - DOSED IN MG/24 HR) 0.2 mg/hr patch   Coronary disease with stable angina Stent placed 2019 left circumflex [employer] Synergy stent Reports aspirin was held in the setting of Eliquis for atrial fibrillation Reports she is only taking 2.5 mg once a day, does not take evening pills, continues to forget Eliquis dosing as below Cholesterol above goal, not taking her Lipitor or Zetia Discussed anginal symptoms to watch for No further ischemic work-up needed at this time  Paroxysmal atrial fibrillation Only taking  Eliquis 2.5 once a day, does not take evening pills Indicates she has no intention to faithfully take her evening medications With this in mind we will move all of her medications to the morning, try to make everything once a day Creatinine clearance 36, will change Eliquis to Xarelto 15 mg daily in the morning -Change metoprolol to tartrate for which she is taking 50 once a day to metoprolol succinate 50 daily -Recommend she call us for any tachypalpitations  Essential hypertension Metoprolol succinate losartan and amlodipine in the morning Does not like taking evening medication  Hyperlipidemia Clearly not taking her medications, recommend she take her Lipitor Zetia in the morning Will need recheck of lipids in several months time with primary care    Total encounter time more than 60 minutes  Greater than 50% was spent in counseling and coordination of care with the patient    Signed, Esmond Plants, M.D., Ph.D. [place], [place]

## 2021-02-05 ENCOUNTER — Ambulatory Visit: Payer: PPO | Admitting: Cardiovascular Disease

## 2021-02-05 ENCOUNTER — Encounter: Payer: Self-pay | Admitting: Cardiovascular Disease

## 2021-02-05 VITALS — BP 120/70 | HR 73 | Ht 62.0 in | Wt 171.0 lb

## 2021-02-05 DIAGNOSIS — E1159 Type 2 diabetes mellitus with other circulatory complications: Secondary | ICD-10-CM

## 2021-02-05 DIAGNOSIS — N1831 Chronic kidney disease, stage 3a: Secondary | ICD-10-CM

## 2021-02-05 DIAGNOSIS — I25118 Atherosclerotic heart disease of native coronary artery with other forms of angina pectoris: Secondary | ICD-10-CM

## 2021-02-05 MED ORDER — ATORVASTATIN CALCIUM 20 MG PO TABS: 20.0000 mg | ORAL_TABLET | Freq: Every morning | ORAL | 3 refills | Status: DC

## 2021-02-05 MED ORDER — RIVAROXABAN 15 MG PO TABS: 15.0000 mg | ORAL_TABLET | Freq: Every day | ORAL | 3 refills | Status: DC

## 2021-02-05 MED ORDER — AMLODIPINE BESYLATE 5 MG PO TABS
5.0000 mg | ORAL_TABLET | Freq: Every morning | ORAL | 3 refills | Status: DC
Start: 2021-02-05 — End: 2021-12-01

## 2021-02-05 MED ORDER — LOSARTAN POTASSIUM 100 MG PO TABS: 100.0000 mg | ORAL_TABLET | Freq: Every morning | ORAL | 3 refills | Status: DC

## 2021-02-05 MED ORDER — METOPROLOL SUCCINATE ER 50 MG PO TB24: 50.0000 mg | ORAL_TABLET | Freq: Every day | ORAL | 3 refills | Status: DC

## 2021-02-05 MED ORDER — EZETIMIBE 10 MG PO TABS: 10.0000 mg | ORAL_TABLET | Freq: Every morning | ORAL | 3 refills | Status: DC

## 2021-02-05 NOTE — Patient Instructions (Addendum)
Medication Instructions:  Please STOP Eliquis metoprolol tartrate Please START Metoprolol succinate 50 mg daily  Xarelto 15 mg daily Free samples (4 bottles) Lot: [id number] & Lot: [id number] Exp: 07/2022 Register for [employer] card  If you need a refill on your cardiac medications before your next appointment, please call your pharmacy.   Lab work: No new labs needed  Testing/Procedures: No new testing needed  Follow-Up: At [facility], you and your health needs are our priority.  As part of our continuing mission to provide you with exceptional heart care, we have created designated Provider Care Teams.  These Care Teams include your primary Cardiologist (physician) and Advanced Practice Providers (APPs -  Physician Assistants and Nurse Practitioners) who all work together to provide you with the care you need, when you need it.  You will need a follow up appointment in 6 months  Providers on your designated Care Team:   Murray Hodgkins, NP Christell Faith, PA-C Cadence Kathlen Mody, [place]  COVID-19 Vaccine Information can be found at: [email or web address] For questions related to vaccine distribution or appointments, please email [email or web address] or call [phone].

## 2021-02-11 ENCOUNTER — Other Ambulatory Visit: Payer: Self-pay

## 2021-02-11 ENCOUNTER — Inpatient Hospital Stay: Payer: PPO

## 2021-02-11 DIAGNOSIS — E538 Deficiency of other specified B group vitamins: Secondary | ICD-10-CM | POA: Diagnosis not present

## 2021-02-11 MED ORDER — CYANOCOBALAMIN 1000 MCG/ML IJ SOLN
1000.0000 ug | Freq: Once | INTRAMUSCULAR | Status: AC
  Administered 2021-02-11: 1000 ug via INTRAMUSCULAR
  Filled 2021-02-11: qty 1

## 2021-02-22 DIAGNOSIS — Z933 Colostomy status: Secondary | ICD-10-CM | POA: Diagnosis not present

## 2021-02-22 DIAGNOSIS — Z433 Encounter for attention to colostomy: Secondary | ICD-10-CM | POA: Diagnosis not present

## 2021-02-25 DIAGNOSIS — H52213 Irregular astigmatism, bilateral: Secondary | ICD-10-CM | POA: Diagnosis not present

## 2021-02-25 DIAGNOSIS — Z9841 Cataract extraction status, right eye: Secondary | ICD-10-CM | POA: Diagnosis not present

## 2021-02-25 DIAGNOSIS — E119 Type 2 diabetes mellitus without complications: Secondary | ICD-10-CM | POA: Diagnosis not present

## 2021-02-25 DIAGNOSIS — Z9842 Cataract extraction status, left eye: Secondary | ICD-10-CM | POA: Diagnosis not present

## 2021-03-02 ENCOUNTER — Encounter: Payer: Self-pay | Admitting: Oncology

## 2021-03-02 ENCOUNTER — Encounter: Payer: Self-pay | Admitting: Emergency Medicine

## 2021-03-02 ENCOUNTER — Ambulatory Visit
Admission: EM | Admit: 2021-03-02 | Discharge: 2021-03-02 | Disposition: A | Discharge status: Home / Self Care | Payer: PPO | Attending: Emergency Medicine | Admitting: Emergency Medicine

## 2021-03-02 ENCOUNTER — Other Ambulatory Visit: Payer: Self-pay

## 2021-03-02 DIAGNOSIS — B349 Viral infection, unspecified: Secondary | ICD-10-CM | POA: Diagnosis not present

## 2021-03-02 MED ORDER — BENZONATATE 100 MG PO CAPS: 100.0000 mg | ORAL_CAPSULE | Freq: Three times a day (TID) | ORAL | 0 refills | Status: DC | PRN

## 2021-03-02 NOTE — ED Provider Notes (Signed)
[facility]    CSN: [id number] Arrival date & time: 03/02/21  1025      History   Chief Complaint Chief Complaint  Patient presents with   Cough   Nasal Congestion   Generalized Body Aches    HPI Sarah Potts is a 87 y.o. female.  Patient presents with 1 day history of body aches, nasal congestion, scratchy throat, cough.  No fever, rash, shortness of breath, vomiting, diarrhea, or other symptoms.  No treatments attempted at home.  Her medical history includes hypertension, diabetes, CKD, anemia, anal cancer.  The history is provided by the patient and medical records.   Past Medical History:  Diagnosis Date   Anal cancer ([place])    Diabetes mellitus    Hyperlipidemia    Hypertension     Patient Active Problem List   Diagnosis Date Noted   Absolute anemia 01/09/2021   Lung nodule 01/09/2021   Anemia in stage 3a chronic kidney disease ([place]) 01/09/2021   B12 deficiency 01/09/2021   Stage 3a chronic kidney disease ([place]) 01/09/2021   Goals of care, counseling/discussion 12/18/2020   History of anal cancer 12/11/2020   Bronchitis 12/15/2012   CAD (coronary artery disease) 11/13/2010   HTN (hypertension) 11/13/2010   Diabetes mellitus ([place]) 11/13/2010   Hyperlipidemia LDL goal < 70 11/13/2010   Obesity 11/13/2010    Past Surgical History:  Procedure Laterality Date   [place]  03/25/2018   Stent to the LCX  2.50 mm x 27mm  [employer] REF [id number]   heart stent     TONSILLECTOMY     uterus surgery     removed    OB History   No obstetric history on file.      Home Medications    Prior to Admission medications   Medication Sig Start Date End Date Taking? Authorizing Provider  ALPRAZolam Duanne Moron) 0.5 MG tablet Take 1 tablet (0.5 mg total) by mouth See admin instructions. Take 1 tablet prior to MRI appointment. You may  repeat another dose. thanks 01/04/21   [name], MD  amLODipine (NORVASC) 5 MG tablet Take 1 tablet (5 mg total) by mouth in the morning. 02/05/21   Minna Merritts, MD  atorvastatin (LIPITOR) 20 MG tablet Take 1 tablet (20 mg total) by mouth in the morning. For cholesterol 02/05/21   Minna Merritts, MD  Cholecalciferol 25 MCG (1000 UT) tablet Take by mouth. Pt takes 3 daily    [provider]  ezetimibe (ZETIA) 10 MG tablet Take 1 tablet (10 mg total) by mouth in the morning. For cholesterol 02/05/21   Gollan, Kathlene November, MD  Iron-Vitamin C 65-125 MG TABS Take 1 tablet by mouth daily. 01/09/21   [name], MD  losartan (COZAAR) 100 MG tablet Take 1 tablet (100 mg total) by mouth in the morning. 02/05/21   Minna Merritts, MD  magnesium oxide (MAG-OX) 400 MG tablet Take by mouth.    [provider]  metFORMIN (GLUMETZA) 1000 MG (MOD) 24 hr tablet Take 1,000 mg by mouth 2 (two) times daily with a meal.      [provider]  metoprolol succinate (TOPROL-XL) 50 MG 24 hr tablet Take 1 tablet (50 mg total) by mouth daily. 02/05/21 05/06/21  Minna Merritts, MD  mirabegron ER (MYRBETRIQ) 50 MG TB24 tablet Take by mouth.  [provider]  montelukast (SINGULAIR) 10 MG tablet Take 10 mg by mouth at bedtime.      [provider]  nitroGLYCERIN (NITRODUR - DOSED IN MG/24 HR) 0.2 mg/hr patch PLEASE SEE ATTACHED FOR DETAILED DIRECTIONS 01/22/21   [provider]  omega-3 acid ethyl esters (LOVAZA) 1 g capsule Take 2 g by mouth 2 (two) times daily.      [provider]  omeprazole (PRILOSEC) 40 MG capsule Take 40 mg by mouth daily.      [provider]  pregabalin (LYRICA) 50 MG capsule Take 50 mg by mouth 3 (three) times daily. Patient not taking: Reported on 02/05/2021    [provider]  pregabalin (LYRICA) 75 MG capsule Take 75 mg by mouth 2 (two) times daily. 01/13/21   [provider]  Rivaroxaban (XARELTO) 15  MG TABS tablet Take 1 tablet (15 mg total) by mouth daily. 02/05/21   Minna Merritts, MD  sertraline (ZOLOFT) 50 MG tablet Take 1 tablet by mouth daily. Pt taking 1/2 daily 12/05/20   [provider]  traZODone (DESYREL) 50 MG tablet Take 1 tablet by mouth at bedtime. 01/22/21 01/22/22  [provider]  TRULICITY 1.5 ZO/1.0RU SOPN Inject into the skin once a week. 12/16/20   [provider]    Family History Family History  Problem Relation Age of Onset   Cancer Mother    Breast cancer Mother    Heart disease Father    Heart attack Father    Cancer Sister    Heart disease Sister    Cervical cancer Sister    Heart disease Brother     Social History Social History   Tobacco Use   Smoking status: Never   Smokeless tobacco: Never  Substance Use Topics   Alcohol use: No   Drug use: No     Allergies   Levofloxacin, Niacin, and Niacin and related   Review of Systems Review of Systems  Constitutional:  Negative for chills and fever.  HENT:  Positive for congestion and sore throat. Negative for ear pain.   Respiratory:  Positive for cough. Negative for shortness of breath.   Cardiovascular:  Negative for chest pain and palpitations.  Gastrointestinal:  Negative for diarrhea and vomiting.  Skin:  Negative for color change and rash.  All other systems reviewed and are negative.   Physical Exam Triage Vital Signs ED Triage Vitals  Enc Vitals Group     BP      Pulse      Resp      Temp      Temp src      SpO2      Weight      Height      Head Circumference      Peak Flow      Pain Score      Pain Loc      Pain Edu?      Excl. in [place]?    No data found.  Updated Vital Signs BP (!) 149/85   Pulse 78   Temp 98.3 F (36.8 C) (Oral)   Resp 18   SpO2 98%   Visual Acuity Right Eye Distance:   Left Eye Distance:   Bilateral Distance:    Right Eye Near:   Left Eye Near:    Bilateral Near:     Physical Exam Vitals and nursing note  reviewed.  Constitutional:      General: She is not  in acute distress.    Appearance: She is well-developed. She is not ill-appearing.  HENT:     Head: Normocephalic and atraumatic.     Right Ear: Tympanic membrane normal.     Left Ear: Tympanic membrane normal.     Nose: Nose normal.     Mouth/Throat:     Mouth: Mucous membranes are moist.     Pharynx: Oropharynx is clear.  Cardiovascular:     Rate and Rhythm: Normal rate and regular rhythm.     Heart sounds: Normal heart sounds.  Pulmonary:     Effort: Pulmonary effort is normal. No respiratory distress.     Breath sounds: Normal breath sounds.  Abdominal:     Palpations: Abdomen is soft.     Tenderness: There is no abdominal tenderness.  Musculoskeletal:     Cervical back: Neck supple.  Skin:    General: Skin is warm and dry.  Neurological:     Mental Status: She is alert.  Psychiatric:        Mood and Affect: Mood normal.        Behavior: Behavior normal.     UC Treatments / Results  Labs (all labs ordered are listed, but only abnormal results are displayed) Labs Reviewed  COVID-19, FLU A+B NAA    EKG   Radiology No results found.  Procedures Procedures (including critical care time)  Medications Ordered in UC Medications - No data to display  Initial Impression / Assessment and Plan / UC Course  I have reviewed the triage vital signs and the nursing notes.  Pertinent labs & imaging results that were available during my care of the patient were reviewed by me and considered in my medical decision making (see chart for details).  Viral illness.  Patient is well-appearing and her exam is reassuring.  COVID and Flu pending.  Patient declines Tessalon Perles for her cough.  Discussed symptomatic treatment including Tylenol, rest, hydration.  Instructed patient to follow up with PCP if symptoms are not improving.  Patient agrees to plan of care.    Final Clinical Impressions(s) / UC Diagnoses   Final  diagnoses:  Viral illness     Discharge Instructions      Your COVID and Flu tests are pending.    Take Tylenol as needed for fever or discomfort.  Rest and keep yourself hydrated.    Follow-up with your primary care provider if your symptoms are not improving.         ED Prescriptions     Medication Sig Dispense Auth. Provider   benzonatate (TESSALON) 100 MG capsule  (Status: Discontinued) Take 1 capsule (100 mg total) by mouth 3 (three) times daily as needed for cough. 21 capsule Sharion Balloon, NP      PDMP not reviewed this encounter.   Sharion Balloon, NP 03/02/21 1105

## 2021-03-02 NOTE — Discharge Instructions (Addendum)
Your COVID and Flu tests are pending.    Take Tylenol as needed for fever or discomfort.  Rest and keep yourself hydrated.    Follow-up with your primary care provider if your symptoms are not improving.     

## 2021-03-02 NOTE — ED Triage Notes (Signed)
Pt here with cough, body aches and nasal congestion x 1 day. She requests COVID test.

## 2021-03-03 LAB — COVID-19, FLU A+B NAA
Influenza A, NAA: NOT DETECTED
Influenza B, NAA: NOT DETECTED
SARS-CoV-2, NAA: NOT DETECTED

## 2021-03-04 DIAGNOSIS — E119 Type 2 diabetes mellitus without complications: Secondary | ICD-10-CM | POA: Diagnosis not present

## 2021-03-04 DIAGNOSIS — J0100 Acute maxillary sinusitis, unspecified: Secondary | ICD-10-CM | POA: Diagnosis not present

## 2021-03-04 DIAGNOSIS — Z7901 Long term (current) use of anticoagulants: Secondary | ICD-10-CM | POA: Diagnosis not present

## 2021-03-13 DIAGNOSIS — Z86010 Personal history of colonic polyps: Secondary | ICD-10-CM | POA: Diagnosis not present

## 2021-03-13 DIAGNOSIS — Z433 Encounter for attention to colostomy: Secondary | ICD-10-CM | POA: Diagnosis not present

## 2021-03-13 DIAGNOSIS — D509 Iron deficiency anemia, unspecified: Secondary | ICD-10-CM | POA: Diagnosis not present

## 2021-03-13 DIAGNOSIS — Z85048 Personal history of other malignant neoplasm of rectum, rectosigmoid junction, and anus: Secondary | ICD-10-CM | POA: Diagnosis not present

## 2021-03-16 DIAGNOSIS — Z433 Encounter for attention to colostomy: Secondary | ICD-10-CM | POA: Insufficient documentation

## 2021-03-16 DIAGNOSIS — Z933 Colostomy status: Secondary | ICD-10-CM | POA: Insufficient documentation

## 2021-03-18 ENCOUNTER — Telehealth: Payer: Self-pay | Admitting: Cardiovascular Disease

## 2021-03-18 NOTE — Telephone Encounter (Signed)
   [facility] Pre-operative Risk Assessment    Patient Name: Sarah Potts  DOB: 1932/04/20 MRN: [id number]  HEARTCARE STAFF:  - IMPORTANT!!!!!! Under Visit Info/Reason for Call, type in Other and utilize the format Clearance MM/DD/YY or Clearance TBD. Do not use dashes or single digits. - Please review there is not already an duplicate clearance open for this procedure. - If request is for dental extraction, please clarify the # of teeth to be extracted. - If the patient is currently at the dentist's office, call Pre-Op Callback Staff (MA/nurse) to input urgent request.  - If the patient is not currently in the dentist office, please route to the Pre-Op pool.  Request for surgical clearance:  What type of surgery is being performed? Colonoscopy / EGD  When is this surgery scheduled? 05/30/21  What type of clearance is required (medical clearance vs. Pharmacy clearance to hold med vs. Both)? both  Are there any medications that need to be held prior to surgery and how long? Xarelto  Practice name and name of physician performing surgery? [facility] - Dr Virgina Jock  What is the office phone number? [phone]   7.   What is the office fax number? [phone]  8.   Anesthesia type (None, local, MAC, general) ? General    Caryl Pina Gerringer 03/18/2021, 3:24 PM  _________________________________________________________________   (provider comments below)

## 2021-03-20 NOTE — Telephone Encounter (Signed)
   Name: Sarah Potts  DOB: 1933-03-31  MRN: [id number]   Primary Cardiologist: Ida Rogue, MD  Chart reviewed as part of pre-operative protocol coverage. Patient was contacted 03/20/2021 in reference to pre-operative risk assessment for pending surgery as outlined below.  Sarah Potts was last seen on 02/05/21 by Dr. Rockey Situ.  Since that day, Sarah Potts has done well.  She has a history of CAD. She can complete 4.0 METS without angina, but she does use nitro patch. Given that she requires nitro and her advanced age, she is at higher risk for MACE, but her cardiac conditions are not unstable.   Per our clinical pharmacist: Per office protocol, patient can hold Xarelto for 1-2 days prior to procedure.  Therefore, based on ACC/AHA guidelines, the patient would be at acceptable risk for the planned procedure without further cardiovascular testing.   The patient was advised that if she develops new symptoms prior to surgery to contact our office to arrange for a follow-up visit, and she verbalized understanding.  I will route this recommendation to the requesting party via Epic fax function and remove from pre-op pool. Please call with questions.  Tami Lin Regana Kemple, PA 03/20/2021, 2:26 PM

## 2021-03-20 NOTE — Telephone Encounter (Signed)
Left VM  Hx of CAD.

## 2021-03-20 NOTE — Telephone Encounter (Signed)
Patient with diagnosis of afib on Xarelto for anticoagulation.    Procedure: Colonoscopy / EGD Date of procedure: 05/30/21   CHA2DS2-VASc Score = 6   This indicates a 9.7% annual risk of stroke. The patient's score is based upon: CHF History: 0 HTN History: 1 Diabetes History: 1 Stroke History: 0 Vascular Disease History: 1 Age Score: 2 Gender Score: 1     CrCl 34 ml/min  Per office protocol, patient can hold Xarelto for 1-2 days prior to procedure.

## 2021-03-27 DIAGNOSIS — Z933 Colostomy status: Secondary | ICD-10-CM | POA: Diagnosis not present

## 2021-03-27 DIAGNOSIS — Z433 Encounter for attention to colostomy: Secondary | ICD-10-CM | POA: Diagnosis not present

## 2021-03-30 DIAGNOSIS — Z933 Colostomy status: Secondary | ICD-10-CM | POA: Diagnosis not present

## 2021-03-30 DIAGNOSIS — Z433 Encounter for attention to colostomy: Secondary | ICD-10-CM | POA: Diagnosis not present

## 2021-04-09 ENCOUNTER — Ambulatory Visit: Payer: PPO | Admitting: Internal Medicine

## 2021-04-10 ENCOUNTER — Ambulatory Visit
Admission: RE | Admit: 2021-04-10 | Discharge: 2021-04-10 | Disposition: A | Discharge status: Home / Self Care | Payer: PPO | Source: Ambulatory Visit | Attending: Oncology | Admitting: Oncology

## 2021-04-10 ENCOUNTER — Encounter: Payer: Self-pay | Admitting: Oncology

## 2021-04-10 ENCOUNTER — Other Ambulatory Visit: Payer: Self-pay

## 2021-04-10 ENCOUNTER — Inpatient Hospital Stay: Payer: PPO | Attending: Oncology

## 2021-04-10 DIAGNOSIS — C210 Malignant neoplasm of anus, unspecified: Secondary | ICD-10-CM | POA: Diagnosis not present

## 2021-04-10 DIAGNOSIS — D508 Other iron deficiency anemias: Secondary | ICD-10-CM

## 2021-04-10 DIAGNOSIS — E538 Deficiency of other specified B group vitamins: Secondary | ICD-10-CM

## 2021-04-10 DIAGNOSIS — I700 Atherosclerosis of aorta: Secondary | ICD-10-CM | POA: Diagnosis not present

## 2021-04-10 DIAGNOSIS — R911 Solitary pulmonary nodule: Secondary | ICD-10-CM | POA: Diagnosis not present

## 2021-04-10 DIAGNOSIS — R918 Other nonspecific abnormal finding of lung field: Secondary | ICD-10-CM | POA: Diagnosis not present

## 2021-04-10 LAB — IRON AND TIBC
Iron: 76 ug/dL (ref 28–170)
Saturation Ratios: 20 % (ref 10.4–31.8)
TIBC: 375 ug/dL (ref 250–450)
UIBC: 299 ug/dL

## 2021-04-10 LAB — CBC WITH DIFFERENTIAL/PLATELET
Abs Immature Granulocytes: 0.02 10*3/uL (ref 0.00–0.07)
Basophils Absolute: 0 10*3/uL (ref 0.0–0.1)
Basophils Relative: 1 %
Eosinophils Absolute: 0.1 10*3/uL (ref 0.0–0.5)
Eosinophils Relative: 3 %
HCT: 34.6 % — ABNORMAL LOW (ref 36.0–46.0)
Hemoglobin: 11.5 g/dL — ABNORMAL LOW (ref 12.0–15.0)
Immature Granulocytes: 1 %
Lymphocytes Relative: 25 %
Lymphs Abs: 0.9 10*3/uL (ref 0.7–4.0)
MCH: 30.7 pg (ref 26.0–34.0)
MCHC: 33.2 g/dL (ref 30.0–36.0)
MCV: 92.3 fL (ref 80.0–100.0)
Monocytes Absolute: 0.3 10*3/uL (ref 0.1–1.0)
Monocytes Relative: 7 %
Neutro Abs: 2.4 10*3/uL (ref 1.7–7.7)
Neutrophils Relative %: 63 %
Platelets: 217 10*3/uL (ref 150–400)
RBC: 3.75 MIL/uL — ABNORMAL LOW (ref 3.87–5.11)
RDW: 13.3 % (ref 11.5–15.5)
WBC: 3.7 10*3/uL — ABNORMAL LOW (ref 4.0–10.5)
nRBC: 0 % (ref 0.0–0.2)

## 2021-04-10 LAB — VITAMIN B12: Vitamin B-12: 219 pg/mL (ref 180–914)

## 2021-04-10 LAB — FERRITIN: Ferritin: 12 ng/mL (ref 11–307)

## 2021-04-10 IMAGING — CT CT CHEST W/O CM
2 of 4 series · 15 of 36 positions shown, 18 images · non-contrast
Comparison: [DATE]

CLINICAL DATA: Follow-up indeterminate right lung nodule. History
of anal carcinoma.

EXAM:
CT CHEST WITHOUT CONTRAST
TECHNIQUE: Multidetector CT imaging of the chest was performed following the
standard protocol without IV contrast.

[Series 2: thorax · axial · 0.69mm/px · z∈[-332,-78]mm · 12 of 151 slices shown, 15 images]
[im 12/151  mediastinal]
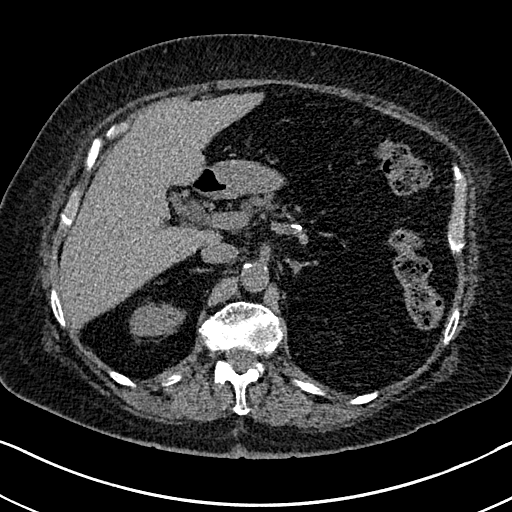
[im 12/151  lung]
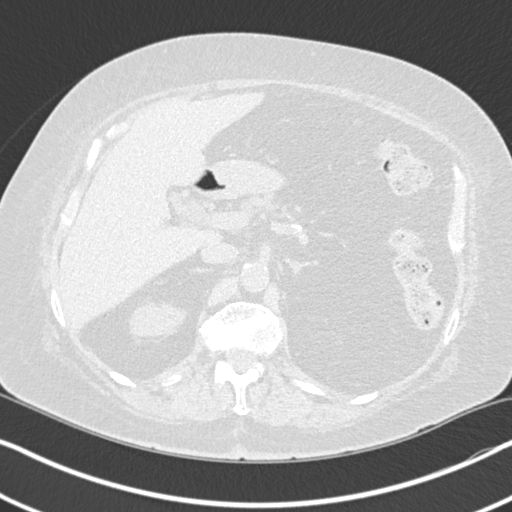
[im 24/151  lung]
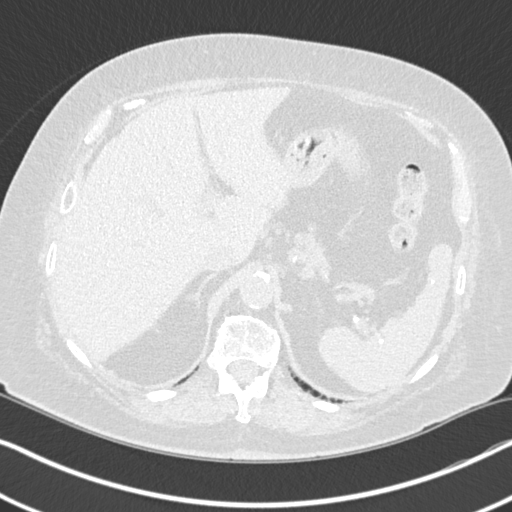
[im 35/151  lung]
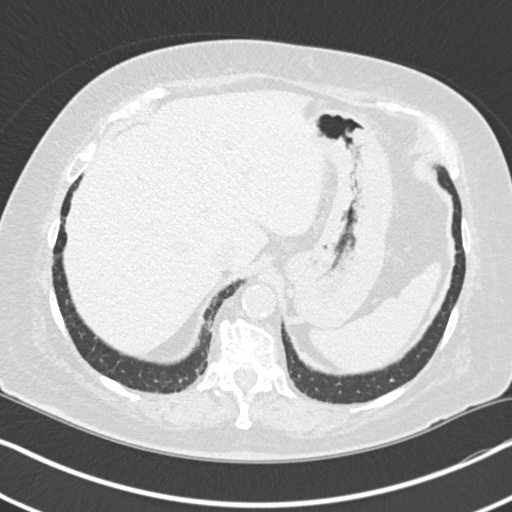
[im 47/151  lung]
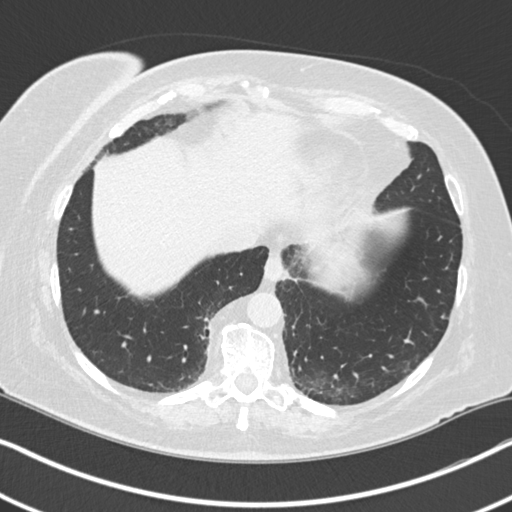
[im 58/151  mediastinal]
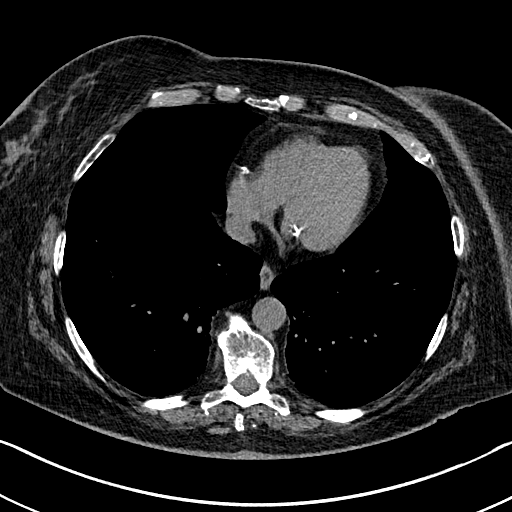
[im 58/151  lung]
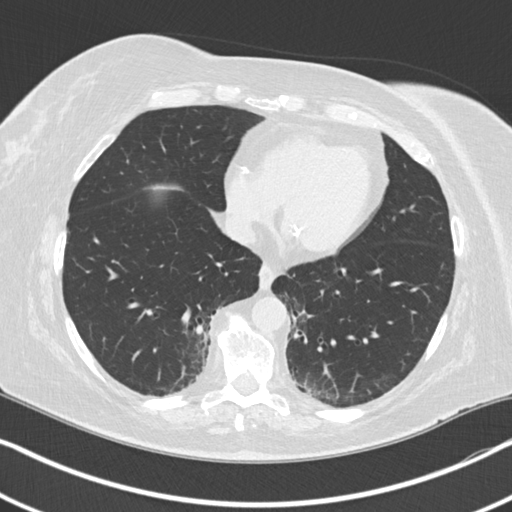
[im 70/151  lung]
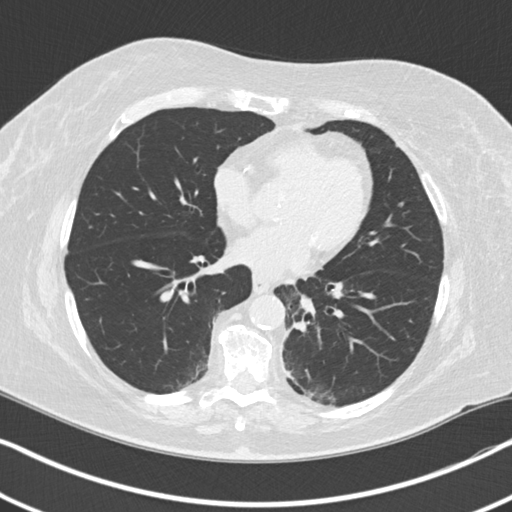
[im 81/151  lung]
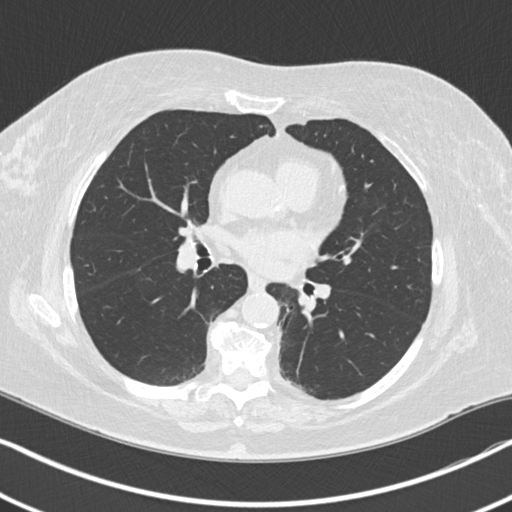
[im 93/151  lung]
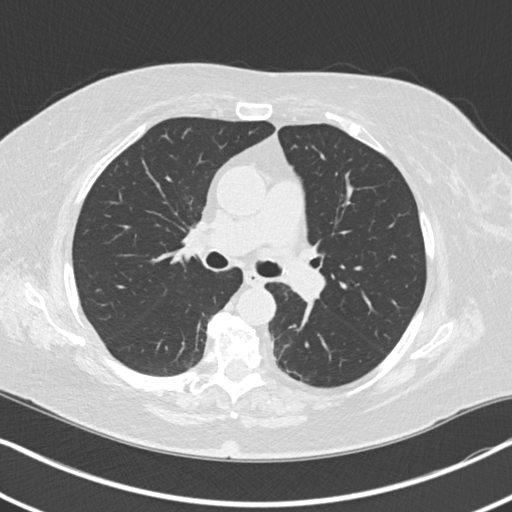
[im 104/151  mediastinal]
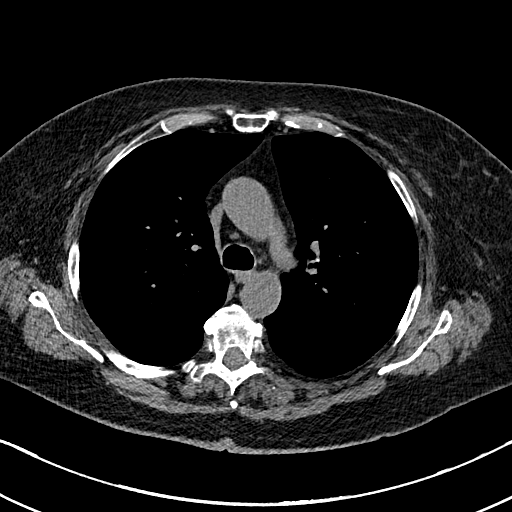
[im 104/151  lung]
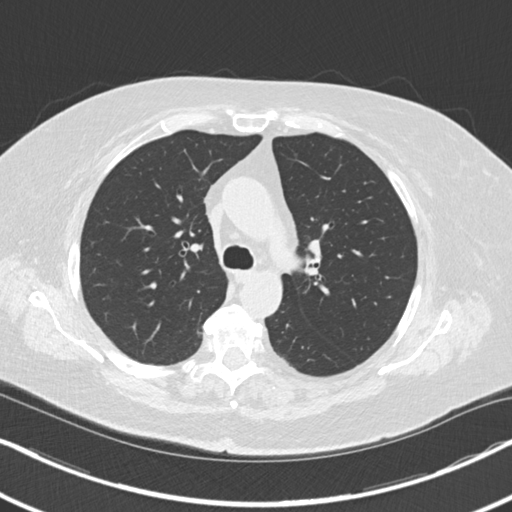
[im 116/151  lung]
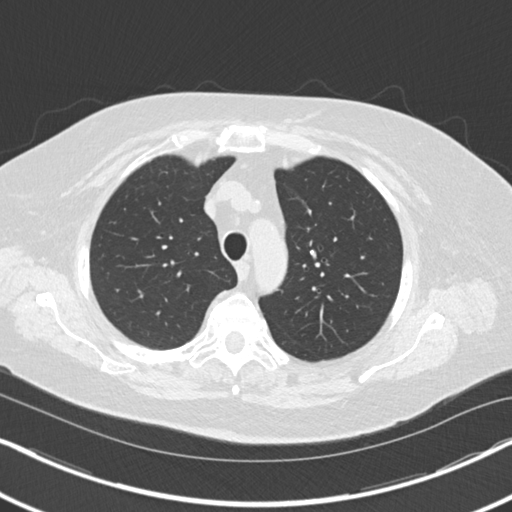
[im 127/151  lung]
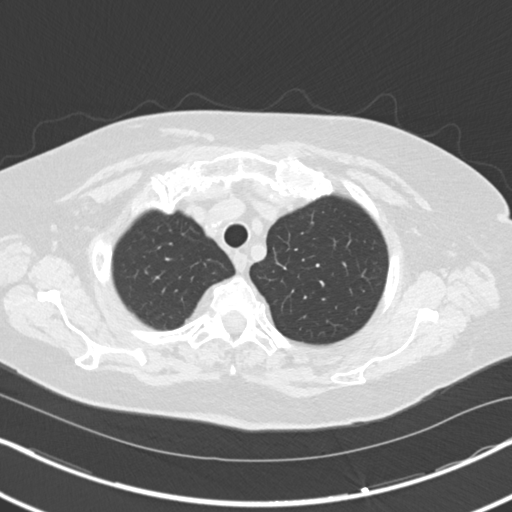
[im 139/151  lung]
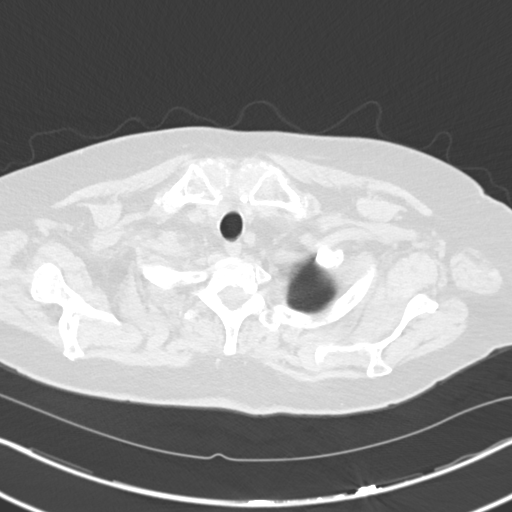

[Series 5: coronal · coronal · 0.66mm/px · 3 of 148 slices shown]
[im 30/148  lung]
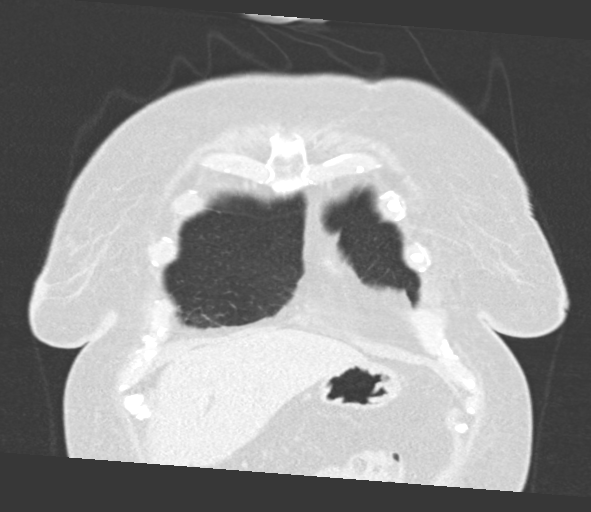
[im 59/148  lung]
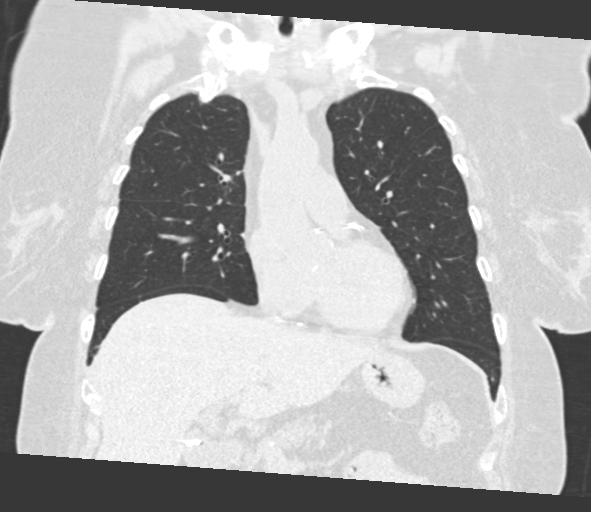
[im 89/148  lung]
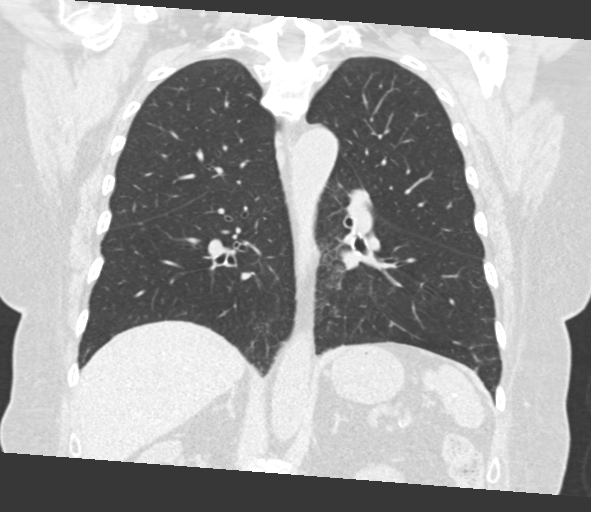

[15 of 36 positions shown; findings below may reference images not displayed]

FINDINGS: Cardiovascular: No acute findings. Aortic and coronary
atherosclerotic calcification noted.

Mediastinum/Nodes: No masses or pathologically enlarged lymph nodes
identified on this unenhanced exam.

Lungs/Pleura: Mild bibasilar predominant interstitial fibrosis again
noted. Two small right lower lobe pulmonary nodules remains stable,
largest measuring 6 mm on image 67/3. A 4 mm pulmonary nodule in the
left lower lobe on image 104/3 is also stable. No new or enlarging
pulmonary nodules or masses identified. No evidence of pulmonary
infiltrate or pleural effusion.

Upper Abdomen:  Unremarkable.

Musculoskeletal:  No suspicious bone lesions.
IMPRESSION: Stable sub-cm bilateral lower lobe pulmonary nodules. Recommend
continued follow-up by chest CT in 6 months.

Stable mild bibasilar predominant pulmonary interstitial fibrosis.

Aortic Atherosclerosis ([A0]-[A0]).

## 2021-04-11 LAB — INTRINSIC FACTOR ANTIBODIES: Intrinsic Factor: 1 AU/mL (ref 0.0–1.1)

## 2021-04-11 LAB — ANTI-PARIETAL ANTIBODY: Parietal Cell Antibody-IgG: 1.8 Units (ref 0.0–20.0)

## 2021-04-12 ENCOUNTER — Inpatient Hospital Stay: Payer: PPO

## 2021-04-12 ENCOUNTER — Inpatient Hospital Stay (HOSPITAL_BASED_OUTPATIENT_CLINIC_OR_DEPARTMENT_OTHER): Payer: PPO | Admitting: Oncology

## 2021-04-12 ENCOUNTER — Other Ambulatory Visit: Payer: Self-pay

## 2021-04-12 ENCOUNTER — Encounter: Payer: Self-pay | Admitting: Oncology

## 2021-04-12 VITALS — BP 133/89 | HR 84 | Temp 97.7°F | Wt 173.3 lb

## 2021-04-12 DIAGNOSIS — N1831 Chronic kidney disease, stage 3a: Secondary | ICD-10-CM | POA: Diagnosis not present

## 2021-04-12 DIAGNOSIS — D631 Anemia in chronic kidney disease: Secondary | ICD-10-CM

## 2021-04-12 DIAGNOSIS — R911 Solitary pulmonary nodule: Secondary | ICD-10-CM

## 2021-04-12 DIAGNOSIS — Z85048 Personal history of other malignant neoplasm of rectum, rectosigmoid junction, and anus: Secondary | ICD-10-CM | POA: Diagnosis not present

## 2021-04-12 DIAGNOSIS — E538 Deficiency of other specified B group vitamins: Secondary | ICD-10-CM

## 2021-04-12 DIAGNOSIS — C210 Malignant neoplasm of anus, unspecified: Secondary | ICD-10-CM | POA: Diagnosis not present

## 2021-04-12 MED ORDER — CYANOCOBALAMIN 1000 MCG/ML IJ SOLN
1000.0000 ug | Freq: Once | INTRAMUSCULAR | Status: AC
  Administered 2021-04-12: 13:00:00 1000 ug via INTRAMUSCULAR
  Filled 2021-04-12: qty 1

## 2021-04-13 ENCOUNTER — Encounter: Payer: Self-pay | Admitting: Oncology

## 2021-04-13 NOTE — Progress Notes (Signed)
Hematology/Oncology Progress Note  [phone] [phone]   Patient Care Team: Maryland Pink, MD as PCP - General (Family Medicine) Minna Merritts, MD as PCP - Cardiology (Cardiology) [name], MD as Consulting Physician (Hematology and Oncology)  REFERRING PROVIDER: Maryland Pink, MD  CHIEF COMPLAINTS/REASON FOR VISIT:  Follow up  history of anal cancer  HISTORY OF PRESENTING ILLNESS:   Sarah Potts is a  87 y.o.  female with PMH listed below was seen in consultation at the request of  Maryland Pink, MD  for evaluation of history of anal cancer  Patient reports history of anal cancer in 2017. I obtained previous oncology records and extensive medical record review was performed by me  Patient has noticed a lesion around her anus for about 2 years prior to seeking medical attention.  The lesion was mostly exophytic and about 4 cm in greatest dimension.  The lesion was initially felt to be confined to the perianal skin. 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  Associated heavy inflammatory infiltrate present at the base of the tumor.  Tumor does not extend to inked deep surgical margin.  Patient was seen by radiation oncology, clinical staging was T2N0 M0 and received adjuvant radiation from 7/272017-11/11/2015.  11/22/2015, PET scan showed hypermetabolic activity in the anus consistent with patient's history of anal cancer with metastatic and right iliac chain/inguinal adenopathy.  02/05/2016.  Patient finished an additional radiation treatments with Dr. Aline Brochure with attention on just to the right inguinal nodes as well as the primary lesion.  05/30/2016, follow-up PET scan showed resolution of previously noted hypermetabolic inguinal lymph node adenopathy.  Physiological bowel uptake making it difficult to tell whether the residual uptake within the rectum is  physiological or residual tumor.  No waning uptake is identified.  Continued follow-up is recommended.  03/17/2018, bone scan showed focal uptake identified involving the right first rib anteriorly at approximately the fifth rib. Patient's pain is primarily in her spine.  No point tenderness over her ribs. 03/24/2018, MRI cervical thoracic lumbar spine showed no metastatic lesion.  Patient has spondylosis.  Patient reports that she had some image scan done in 2020-not available in current medical records that we obtained..  No images done in 2021.  She recalls that she got chemotherapy.-No records available at this point.  Today patient reports feeling well.  Denies any rectal bleeding or discomfort.  INTERVAL HISTORY Sarah Potts is a 87 y.o. female who has above history reviewed by me today presents for follow up visit for anal cancer, lung nodule, vitamin B12 deficiency. Patient has been on monthly vitamin B12 injections.  Tolerates well.  Today she has no new complaints. Patient cannot tolerate oral iron supplementation and stopped it. 03/13/2021, patient establish care with gastroenterology.  Colonoscopy was recommended for work-up of iron deficiency anemia.  Patient declined.  Review of Systems  Constitutional:  Negative for appetite change, chills, fatigue and fever.  HENT:   Negative for hearing loss and voice change.   Eyes:  Negative for eye problems.  Respiratory:  Negative for chest tightness and cough.   Cardiovascular:  Negative for chest pain.  Gastrointestinal:  Negative for abdominal distention, abdominal pain and blood in stool.  Endocrine: Negative for hot flashes.  Genitourinary:  Negative for difficulty urinating and frequency.   Musculoskeletal:  Negative for arthralgias.  Skin:  Negative for itching and rash.  Neurological:  Negative for extremity weakness.  Hematological:  Negative for adenopathy.  Psychiatric/Behavioral:  Negative for confusion.    MEDICAL  HISTORY:  Past Medical History:  Diagnosis Date   Anal cancer ([place])    Diabetes mellitus    Hyperlipidemia    Hypertension     SURGICAL HISTORY: Past Surgical History:  Procedure Laterality Date   BLADDER SURGERY     CARDIAC CATHETERIZATION     COLOSTOMY     CORONARY ANGIOPLASTY WITH STENT PLACEMENT  03/25/2018   Stent to the LCX  2.50 mm x 64mm  [employer] REF [id number]   heart stent     TONSILLECTOMY     uterus surgery     removed    SOCIAL HISTORY: Social History   Socioeconomic History   Marital status: Single    Spouse name: Not on file   Number of children: Not on file   Years of education: Not on file   Highest education level: Not on file  Occupational History   Not on file  Tobacco Use   Smoking status: Never   Smokeless tobacco: Never  Substance and Sexual Activity   Alcohol use: No   Drug use: No   Sexual activity: Not on file  Other Topics Concern   Not on file  Social History Narrative   Not on file   Social Determinants of Health   Financial Resource Strain: Not on file  Food Insecurity: Not on file  Transportation Needs: Not on file  Physical Activity: Not on file  Stress: Not on file  Social Connections: Not on file  Intimate Partner Violence: Not on file    FAMILY HISTORY: Family History  Problem Relation Age of Onset   Cancer Mother    Breast cancer Mother    Heart disease Father    Heart attack Father    Cancer Sister    Heart disease Sister    Cervical cancer Sister    Heart disease Brother     ALLERGIES:  is allergic to levofloxacin, niacin, and niacin and related.  MEDICATIONS:  Current Outpatient Medications  Medication Sig Dispense Refill   ALPRAZolam (XANAX) 0.5 MG tablet Take 1 tablet (0.5 mg total) by mouth See admin instructions. Take 1 tablet prior to MRI appointment. You may repeat another dose. thanks 2 tablet 0   amLODipine (NORVASC) 5 MG tablet Take 1 tablet (5 mg total) by  mouth in the morning. 90 tablet 3   atorvastatin (LIPITOR) 20 MG tablet Take 1 tablet (20 mg total) by mouth in the morning. For cholesterol 90 tablet 3   Cholecalciferol 25 MCG (1000 UT) tablet Take by mouth. Pt takes 3 daily     ezetimibe (ZETIA) 10 MG tablet Take 1 tablet (10 mg total) by mouth in the morning. For cholesterol 90 tablet 3   Iron-Vitamin C 65-125 MG TABS Take 1 tablet by mouth daily. 30 tablet 3   losartan (COZAAR) 100 MG tablet Take 1 tablet (100 mg total) by mouth in the morning. 90 tablet 3   magnesium oxide (MAG-OX) 400 MG tablet Take by mouth.     metFORMIN (GLUMETZA) 1000 MG (MOD) 24 hr tablet Take 1,000 mg by mouth 2 (two) times daily with a meal.       metoprolol succinate (TOPROL-XL) 50 MG 24 hr tablet Take 1 tablet (50 mg total) by mouth daily. 90 tablet 3   mirabegron ER (MYRBETRIQ) 50 MG TB24 tablet Take by mouth.     montelukast (SINGULAIR) 10 MG tablet Take  10 mg by mouth at bedtime.       nitroGLYCERIN (NITRODUR - DOSED IN MG/24 HR) 0.2 mg/hr patch PLEASE SEE ATTACHED FOR DETAILED DIRECTIONS     omega-3 acid ethyl esters (LOVAZA) 1 g capsule Take 2 g by mouth 2 (two) times daily.       omeprazole (PRILOSEC) 40 MG capsule Take 40 mg by mouth daily.       pregabalin (LYRICA) 75 MG capsule Take 75 mg by mouth 2 (two) times daily.     Rivaroxaban (XARELTO) 15 MG TABS tablet Take 1 tablet (15 mg total) by mouth daily. 90 tablet 3   sertraline (ZOLOFT) 50 MG tablet Take 1 tablet by mouth daily. Pt taking 1/2 daily     traZODone (DESYREL) 50 MG tablet Take 1 tablet by mouth at bedtime.     TRULICITY 1.5 NO/6.7EH SOPN Inject into the skin once a week.     pregabalin (LYRICA) 50 MG capsule Take 50 mg by mouth 3 (three) times daily. (Patient not taking: Reported on 02/05/2021)     No current facility-administered medications for this visit.     PHYSICAL EXAMINATION: ECOG PERFORMANCE STATUS: 1 - Symptomatic but completely ambulatory Vitals:   04/12/21 1152  BP:  133/89  Pulse: 84  Temp: 97.7 F (36.5 C)   Filed Weights   04/12/21 1152  Weight: 173 lb 4.8 oz (78.6 kg)    Physical Exam Constitutional:      General: She is not in acute distress. HENT:     Head: Normocephalic and atraumatic.  Eyes:     General: No scleral icterus. Cardiovascular:     Rate and Rhythm: Normal rate and regular rhythm.     Heart sounds: Normal heart sounds.  Pulmonary:     Effort: Pulmonary effort is normal. No respiratory distress.     Breath sounds: No wheezing.  Abdominal:     General: Bowel sounds are normal. There is no distension.     Palpations: Abdomen is soft.     Comments: Colostomy  Musculoskeletal:        General: No deformity. Normal range of motion.     Cervical back: Normal range of motion and neck supple.  Skin:    General: Skin is warm and dry.     Findings: No erythema or rash.  Neurological:     Mental Status: She is alert and oriented to person, place, and time. Mental status is at baseline.     Cranial Nerves: No cranial nerve deficit.     Coordination: Coordination normal.  Psychiatric:        Mood and Affect: Mood normal.    LABORATORY DATA:  I have reviewed the data as listed Lab Results  Component Value Date   WBC 3.7 (L) 04/10/2021   HGB 11.5 (L) 04/10/2021   HCT 34.6 (L) 04/10/2021   MCV 92.3 04/10/2021   PLT 217 04/10/2021   Recent Labs    12/20/20 1342  NA 133*  K 4.5  CL 102  CO2 23  GLUCOSE 135*  BUN 17  CREATININE 1.14*  CALCIUM 8.7*  GFRNONAA 47*  PROT 6.6  ALBUMIN 3.6  AST 19  ALT 13  ALKPHOS 42  BILITOT 0.7   Iron/TIBC/Ferritin/ %Sat    Component Value Date/Time   IRON 76 04/10/2021 1015   TIBC 375 04/10/2021 1015   FERRITIN 12 04/10/2021 1015   IRONPCTSAT 20 04/10/2021 1015      RADIOGRAPHIC STUDIES: I have personally reviewed the  radiological images as listed and agreed with the findings in the report. CT Chest Wo Contrast  Result Date: 04/11/2021 CLINICAL DATA:  Follow-up  indeterminate right lung nodule. History of anal carcinoma. EXAM: CT CHEST WITHOUT CONTRAST TECHNIQUE: Multidetector CT imaging of the chest was performed following the standard protocol without IV contrast. COMPARISON:  01/07/2021 FINDINGS: Cardiovascular: No acute findings. Aortic and coronary atherosclerotic calcification noted. Mediastinum/Nodes: No masses or pathologically enlarged lymph nodes identified on this unenhanced exam. Lungs/Pleura: Mild bibasilar predominant interstitial fibrosis again noted. Two small right lower lobe pulmonary nodules remains stable, largest measuring 6 mm on image 67/3. A 4 mm pulmonary nodule in the left lower lobe on image 104/3 is also stable. No new or enlarging pulmonary nodules or masses identified. No evidence of pulmonary infiltrate or pleural effusion. Upper Abdomen:  Unremarkable. Musculoskeletal:  No suspicious bone lesions. IMPRESSION: Stable sub-cm bilateral lower lobe pulmonary nodules. Recommend continued follow-up by chest CT in 6 months. Stable mild bibasilar predominant pulmonary interstitial fibrosis. Aortic Atherosclerosis (ICD10-I70.0). Electronically Signed   By: Marlaine Hind M.D.   On: 04/11/2021 08:33      ASSESSMENT & PLAN:  1. B12 deficiency   2. Lung nodule   3. History of anal cancer   4. Anemia in stage 3a chronic kidney disease ([place])    #History of anal cancer in 2017, she had a concurrent chemoradiation. 01/07/2021, MRI abdomen pelvis with and without contrast showed no evidence of local anal cancer recurrence.  No metastatic adenopathy in the pelvis or periaortic retroperitoneum.  Left lower quadrant colostomy without complication.  No hepatic metastasis. Patient is now 5 years after initial diagnosis and treatments. Imaging will be obtained if clinically indicated.  #Anemia, iron panel shows borderline ferritin of 12.  Anemia is likely secondary to chronic kidney disease.  Avoid nephrotoxins.  She cannot tolerate oral iron  supplementation.  Currently hemoglobin is above 10.  In the future if hemoglobin drops below 10, would consider IV Venofer treatments. #Vitamin B12 deficiency, intrinsic factor antibody and antiparietal antibody were both negative.  Continue #Lung nodule, 01/07/2021 right lower lobe nodule measuring 6 mm, stable.  We will repeat CT in 6 months.  Orders Placed This Encounter  Procedures   CT CHEST WO CONTRAST    Standing Status:   Future    Standing Expiration Date:   04/12/2022    Order Specific Question:   Preferred imaging location?    Answer:   [facility]   CBC with Differential/Platelet    Standing Status:   Future    Standing Expiration Date:   04/12/2022   Comprehensive metabolic panel    Standing Status:   Future    Standing Expiration Date:   04/12/2022   Ferritin    Standing Status:   Future    Standing Expiration Date:   04/12/2022   Vitamin B12    Standing Status:   Future    Standing Expiration Date:   04/12/2022   Iron and TIBC([facility])    All questions were answered. The patient knows to call the clinic with any problems questions or concerns.  cc Maryland Pink, MD   Follow up in 6 months. Lab MD review CT results.  Continue monthly vitamin B12 injections.   [name], MD, PhD  04/13/2021

## 2021-04-24 DIAGNOSIS — R197 Diarrhea, unspecified: Secondary | ICD-10-CM | POA: Diagnosis not present

## 2021-04-24 DIAGNOSIS — L24B1 Irritant contact dermatitis related to digestive stoma or fistula: Secondary | ICD-10-CM | POA: Diagnosis not present

## 2021-04-24 DIAGNOSIS — Z433 Encounter for attention to colostomy: Secondary | ICD-10-CM | POA: Diagnosis not present

## 2021-05-10 ENCOUNTER — Inpatient Hospital Stay: Payer: PPO | Attending: Oncology

## 2021-05-10 ENCOUNTER — Other Ambulatory Visit: Payer: Self-pay

## 2021-05-10 DIAGNOSIS — E538 Deficiency of other specified B group vitamins: Secondary | ICD-10-CM | POA: Insufficient documentation

## 2021-05-10 MED ORDER — CYANOCOBALAMIN 1000 MCG/ML IJ SOLN
1000.0000 ug | Freq: Once | INTRAMUSCULAR | Status: AC
  Administered 2021-05-10: 1000 ug via INTRAMUSCULAR
  Filled 2021-05-10: qty 1

## 2021-05-20 DIAGNOSIS — R262 Difficulty in walking, not elsewhere classified: Secondary | ICD-10-CM | POA: Diagnosis not present

## 2021-05-20 DIAGNOSIS — Z8673 Personal history of transient ischemic attack (TIA), and cerebral infarction without residual deficits: Secondary | ICD-10-CM | POA: Diagnosis not present

## 2021-05-20 DIAGNOSIS — R413 Other amnesia: Secondary | ICD-10-CM | POA: Diagnosis not present

## 2021-05-20 DIAGNOSIS — G629 Polyneuropathy, unspecified: Secondary | ICD-10-CM | POA: Diagnosis not present

## 2021-05-20 DIAGNOSIS — R2689 Other abnormalities of gait and mobility: Secondary | ICD-10-CM | POA: Diagnosis not present

## 2021-05-21 ENCOUNTER — Other Ambulatory Visit (HOSPITAL_COMMUNITY): Payer: Self-pay | Admitting: Physician Assistant

## 2021-05-21 ENCOUNTER — Other Ambulatory Visit: Payer: Self-pay | Admitting: Physician Assistant

## 2021-05-21 DIAGNOSIS — F4489 Other dissociative and conversion disorders: Secondary | ICD-10-CM

## 2021-05-21 DIAGNOSIS — R519 Headache, unspecified: Secondary | ICD-10-CM

## 2021-05-28 DIAGNOSIS — I4891 Unspecified atrial fibrillation: Secondary | ICD-10-CM | POA: Diagnosis not present

## 2021-05-28 DIAGNOSIS — I25118 Atherosclerotic heart disease of native coronary artery with other forms of angina pectoris: Secondary | ICD-10-CM | POA: Diagnosis not present

## 2021-05-28 DIAGNOSIS — E1159 Type 2 diabetes mellitus with other circulatory complications: Secondary | ICD-10-CM | POA: Diagnosis not present

## 2021-05-28 DIAGNOSIS — C210 Malignant neoplasm of anus, unspecified: Secondary | ICD-10-CM | POA: Diagnosis not present

## 2021-05-28 DIAGNOSIS — N1831 Chronic kidney disease, stage 3a: Secondary | ICD-10-CM | POA: Diagnosis not present

## 2021-05-28 DIAGNOSIS — E119 Type 2 diabetes mellitus without complications: Secondary | ICD-10-CM | POA: Diagnosis not present

## 2021-05-28 DIAGNOSIS — E785 Hyperlipidemia, unspecified: Secondary | ICD-10-CM | POA: Diagnosis not present

## 2021-05-28 DIAGNOSIS — I10 Essential (primary) hypertension: Secondary | ICD-10-CM | POA: Diagnosis not present

## 2021-05-28 DIAGNOSIS — D631 Anemia in chronic kidney disease: Secondary | ICD-10-CM | POA: Diagnosis not present

## 2021-05-28 DIAGNOSIS — M5441 Lumbago with sciatica, right side: Secondary | ICD-10-CM | POA: Diagnosis not present

## 2021-05-28 DIAGNOSIS — Z433 Encounter for attention to colostomy: Secondary | ICD-10-CM | POA: Diagnosis not present

## 2021-05-31 DIAGNOSIS — E119 Type 2 diabetes mellitus without complications: Secondary | ICD-10-CM | POA: Diagnosis not present

## 2021-05-31 DIAGNOSIS — E785 Hyperlipidemia, unspecified: Secondary | ICD-10-CM | POA: Diagnosis not present

## 2021-06-03 ENCOUNTER — Ambulatory Visit
Admission: RE | Admit: 2021-06-03 | Discharge: 2021-06-03 | Disposition: A | Discharge status: Home / Self Care | Payer: PPO | Source: Ambulatory Visit | Attending: Physician Assistant | Admitting: Physician Assistant

## 2021-06-03 DIAGNOSIS — R519 Headache, unspecified: Secondary | ICD-10-CM | POA: Diagnosis not present

## 2021-06-03 DIAGNOSIS — F4489 Other dissociative and conversion disorders: Secondary | ICD-10-CM | POA: Diagnosis not present

## 2021-06-03 DIAGNOSIS — R410 Disorientation, unspecified: Secondary | ICD-10-CM | POA: Diagnosis not present

## 2021-06-03 IMAGING — CT CT HEAD W/O CM
4 series · 15 of 47 positions shown, 17 images · non-contrast
Comparison: [DATE]

CLINICAL DATA: Confused state, headache



[Series 2: head bone · axial · 0.41mm/px · z∈[-115,-101]mm · 2 of 73 slices shown]
[im 8/73  bone]
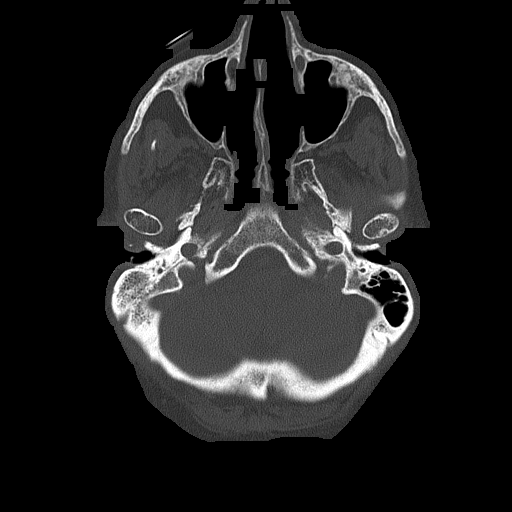
[im 15/73  bone]
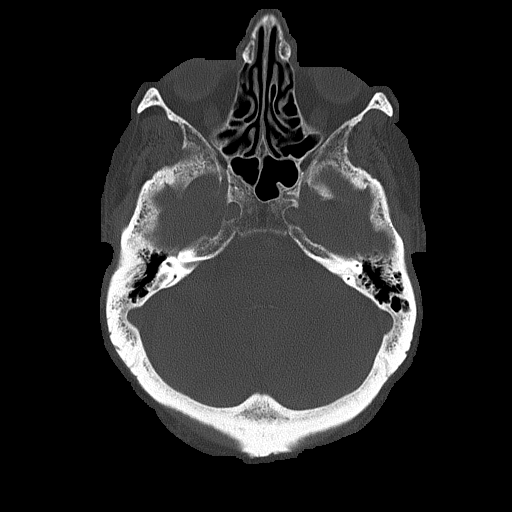

[Series 3: head wo · axial · 0.41mm/px · z∈[-114,-4]mm · 7 of 30 slices shown, 9 images]
[im 4/30  brain]
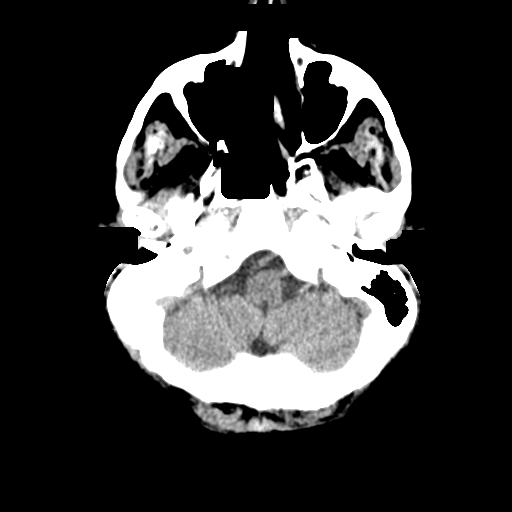
[im 4/30  bone]
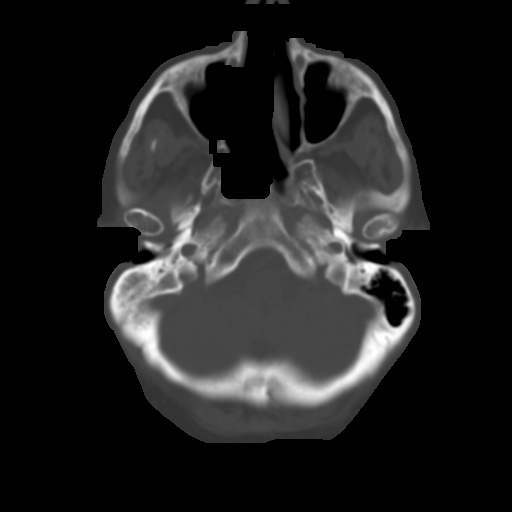
[im 8/30  brain]
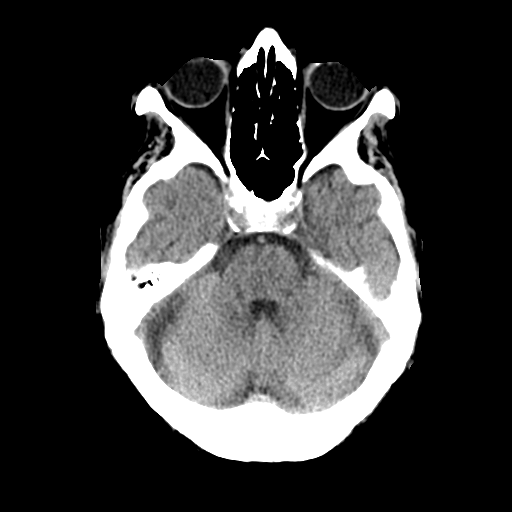
[im 11/30  brain]
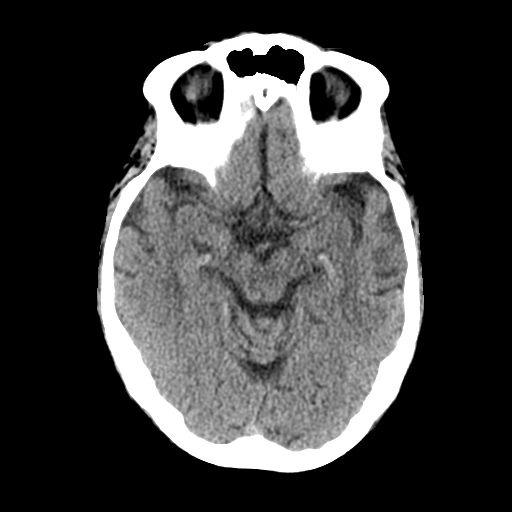
[im 15/30  brain]
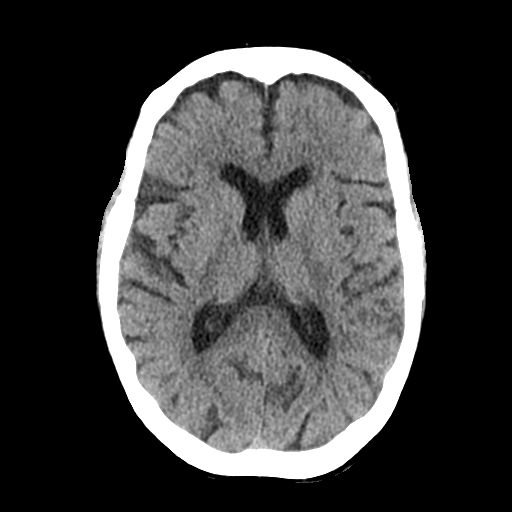
[im 19/30  brain]
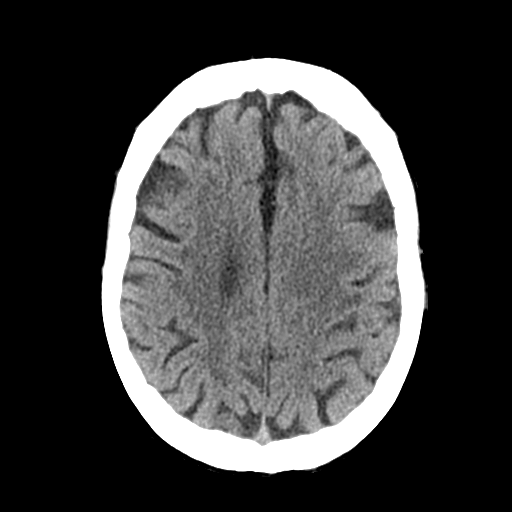
[im 19/30  bone]
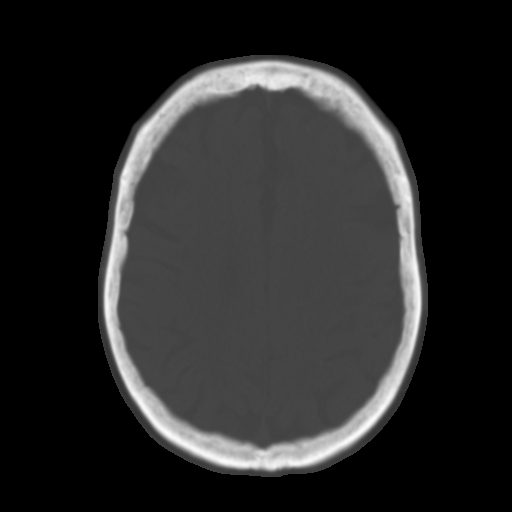
[im 22/30  brain]
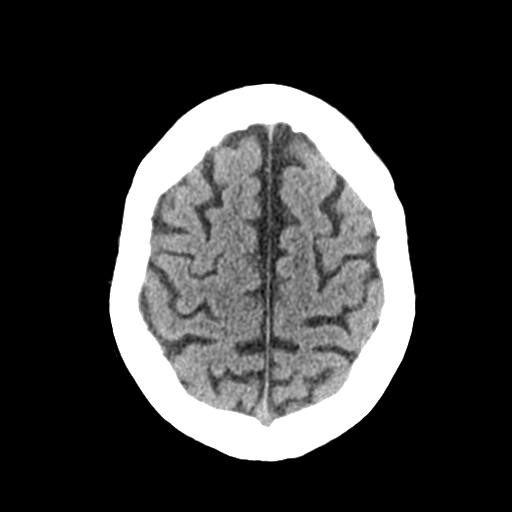
[im 26/30  brain]
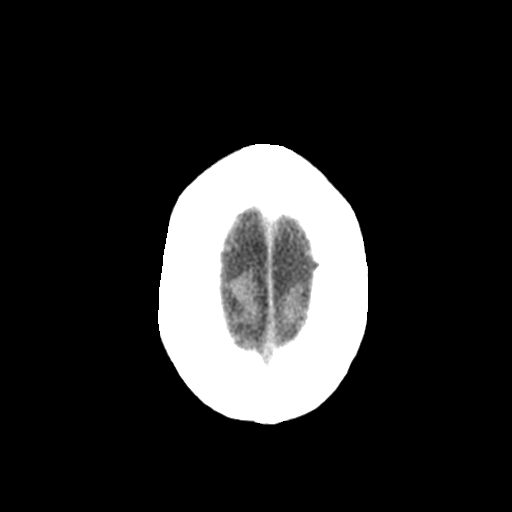

[Series 4: coronal soft tissue · coronal · 0.28mm/px · 3 of 65 slices shown]
[im 22/65  brain]
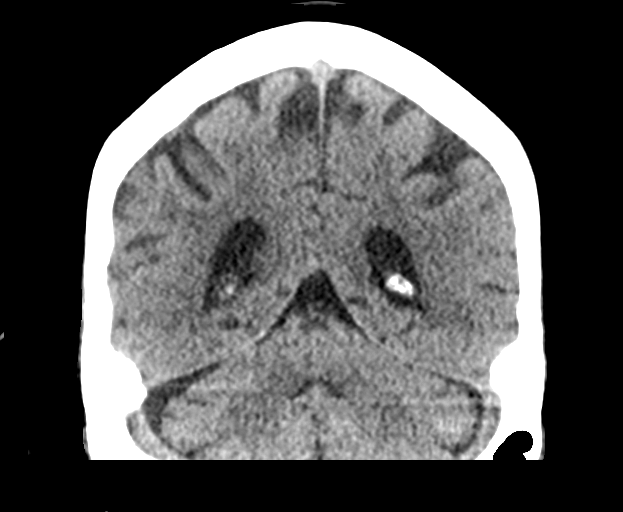
[im 29/65  brain]
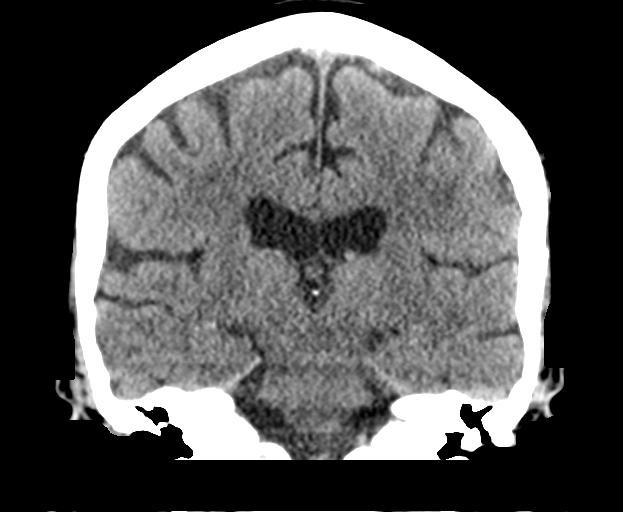
[im 36/65  brain]
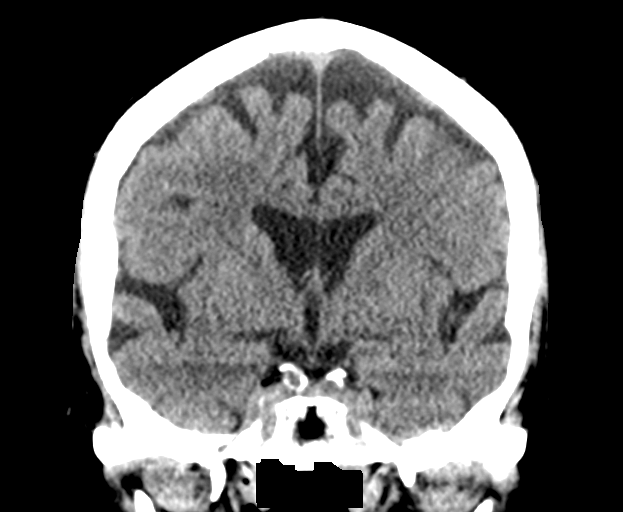

[Series 5: sagittal soft tissue · sagittal · 0.28mm/px · 3 of 54 slices shown]
[im 18/54  brain]
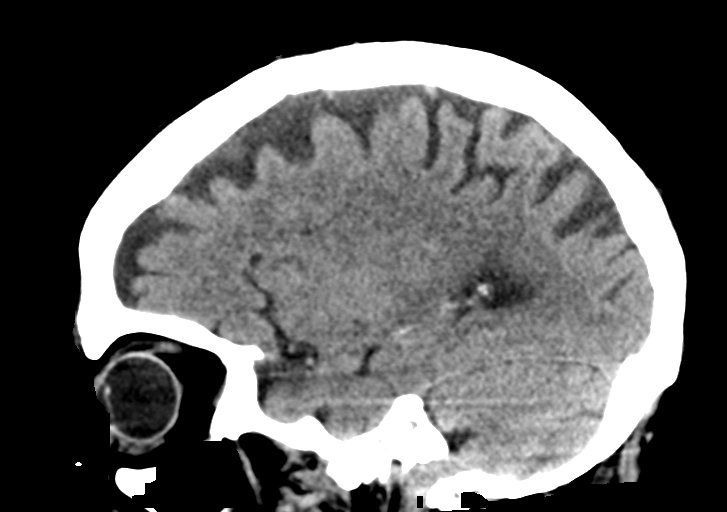
[im 27/54  brain]
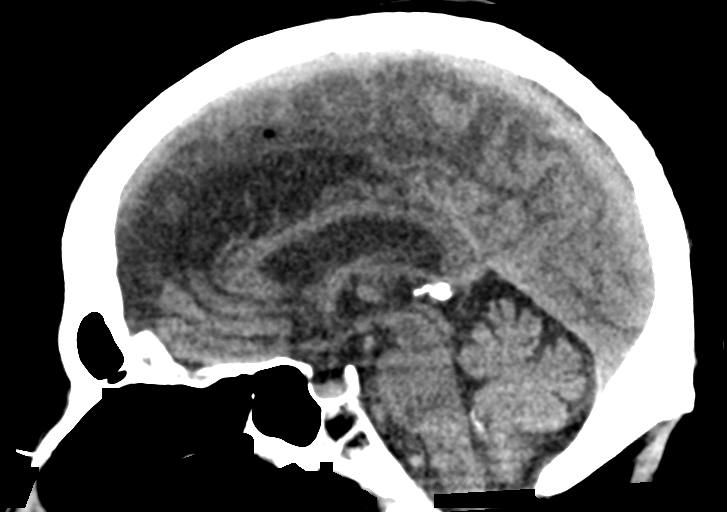
[im 36/54  brain]
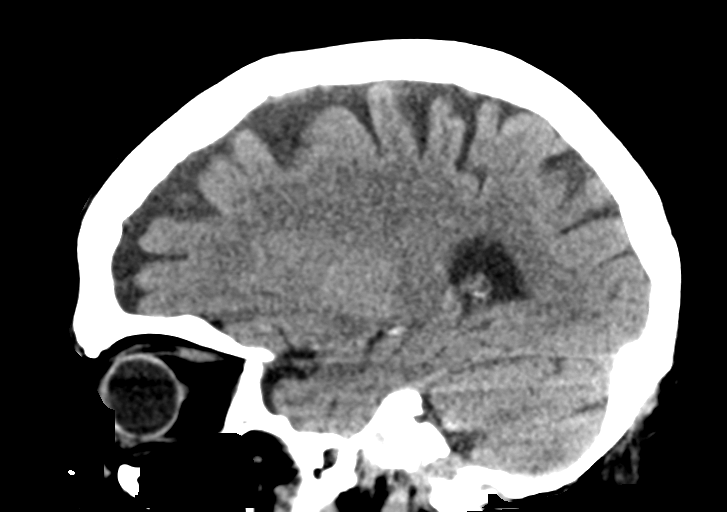

[15 of 47 positions shown; findings below may reference images not displayed]

FINDINGS: Brain: Stable atrophy pattern and minor white matter microvascular
ischemic changes about the lateral ventricles throughout both
cerebral hemispheres. No acute intracranial hemorrhage, mass lesion,
midline shift, herniation, hydrocephalus, or extra-axial fluid
collection. No signs of acute infarct. Cisterns are patent. No focal
mass effect or edema. Cerebellum unremarkable.

Vascular: No hyperdense vessel or unexpected calcification.

Skull: Normal. Negative for fracture or focal lesion.

Sinuses/Orbits: No acute finding.

Other: None.
IMPRESSION: Stable atrophy pattern and white matter microvascular ischemic
changes.

No interval change or acute process by noncontrast CT.

## 2021-06-07 ENCOUNTER — Inpatient Hospital Stay: Payer: PPO | Attending: Oncology

## 2021-06-17 ENCOUNTER — Other Ambulatory Visit (HOSPITAL_COMMUNITY): Payer: Self-pay | Admitting: Physical Medicine and Rehabilitation

## 2021-06-17 ENCOUNTER — Other Ambulatory Visit: Payer: Self-pay | Admitting: Physical Medicine and Rehabilitation

## 2021-06-17 DIAGNOSIS — M48062 Spinal stenosis, lumbar region with neurogenic claudication: Secondary | ICD-10-CM | POA: Diagnosis not present

## 2021-06-17 DIAGNOSIS — M5416 Radiculopathy, lumbar region: Secondary | ICD-10-CM | POA: Diagnosis not present

## 2021-06-26 ENCOUNTER — Other Ambulatory Visit: Payer: Self-pay

## 2021-06-26 ENCOUNTER — Ambulatory Visit
Admission: RE | Admit: 2021-06-26 | Discharge: 2021-06-26 | Disposition: A | Discharge status: Home / Self Care | Payer: PPO | Source: Ambulatory Visit | Attending: Physical Medicine and Rehabilitation | Admitting: Physical Medicine and Rehabilitation

## 2021-06-26 DIAGNOSIS — M47816 Spondylosis without myelopathy or radiculopathy, lumbar region: Secondary | ICD-10-CM | POA: Diagnosis not present

## 2021-06-26 DIAGNOSIS — M5126 Other intervertebral disc displacement, lumbar region: Secondary | ICD-10-CM | POA: Diagnosis not present

## 2021-06-26 DIAGNOSIS — M5416 Radiculopathy, lumbar region: Secondary | ICD-10-CM | POA: Diagnosis not present

## 2021-06-26 IMAGING — MR MR LUMBAR SPINE W/O CM
5 series · 30 of 48 positions shown · non-contrast
Comparison: Abdominal MRI [DATE]

CLINICAL DATA: Low back pain radiating to both legs over the last
1-2 years.

EXAM:
MRI LUMBAR SPINE WITHOUT CONTRAST
TECHNIQUE: Multiplanar, multisequence MR imaging of the lumbar spine was
performed. No intravenous contrast was administered.

[Series 5: T2 · sagittal · 4.0mm · 0.81mm/px · 6 of 17 slices shown (1 of 2)]
[im 1/17]
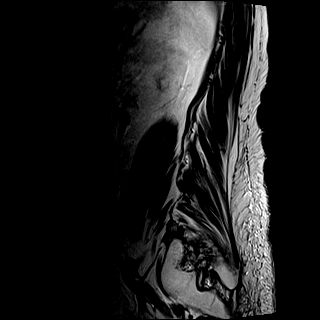
[im 4/17]
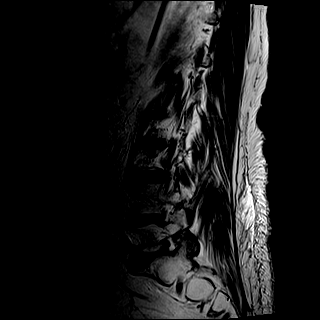
[im 7/17]
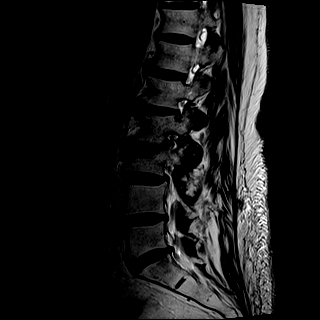
[im 10/17]
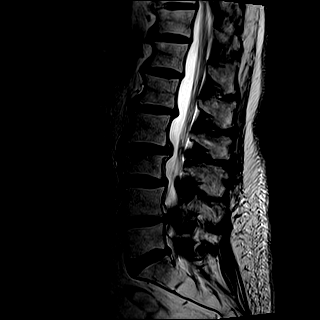
[im 13/17]
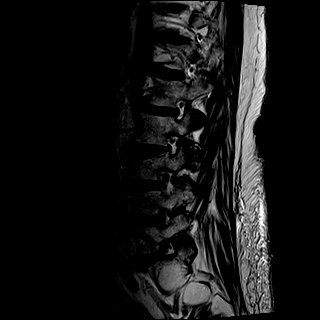
[im 17/17]
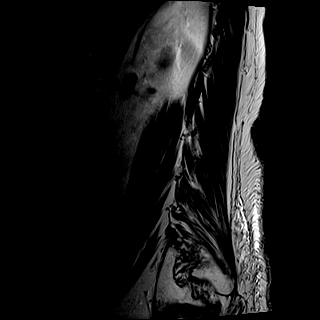

[Series 6: T1 · sagittal · 4.0mm · 0.81mm/px · 7 of 17 slices shown (1 of 2)]
[im 1/17]
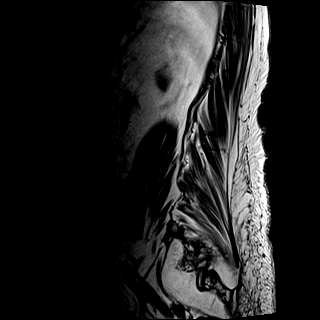
[im 3/17]
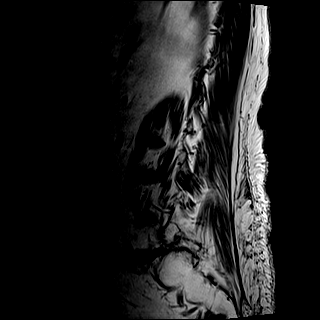
[im 6/17]
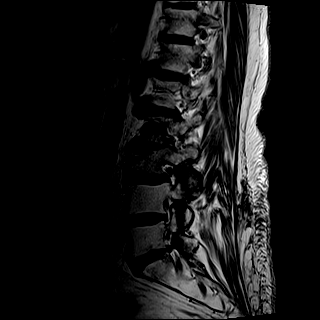
[im 9/17]
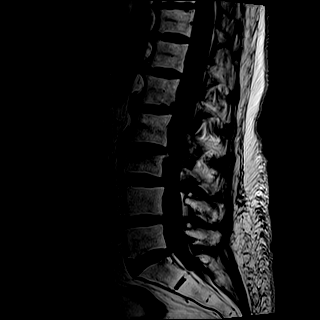
[im 11/17]
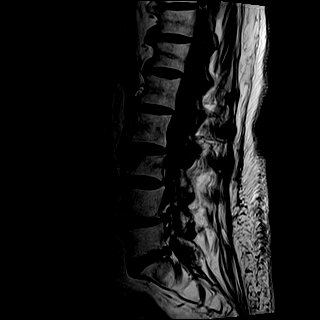
[im 14/17]
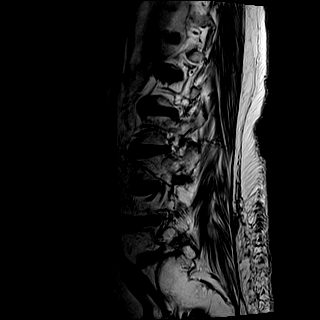
[im 17/17]
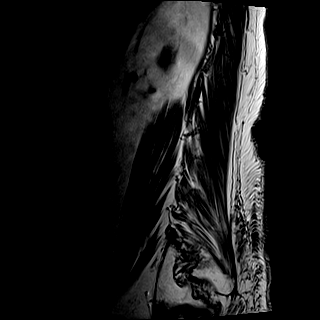

[Series 7: STIR · sagittal · 4.0mm · 0.41mm/px · 1 of 17 slices shown]
[im 1/17]
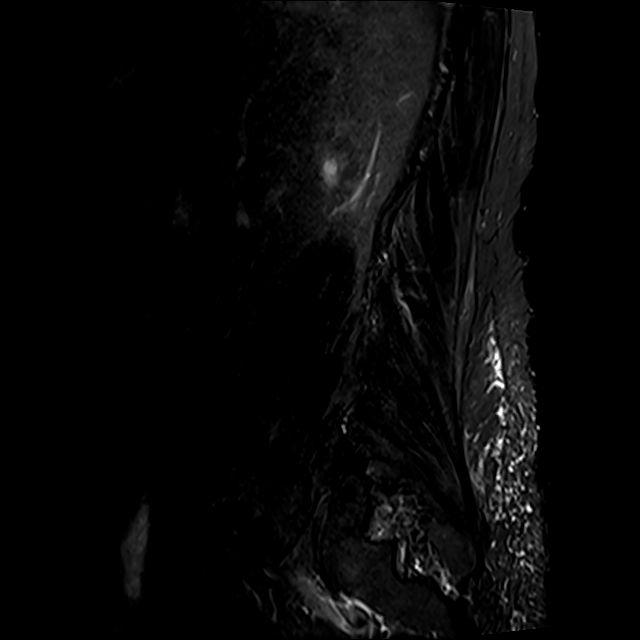

[Series 8: T2 · axial · 4.0mm · 0.78mm/px · z∈[-136,+62]mm · 8 of 34 slices shown (2 of 2)]
[im 1/34]
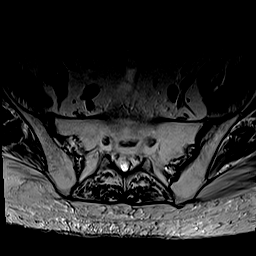
[im 6/34]
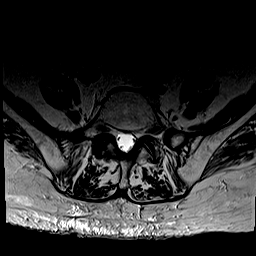
[im 11/34]
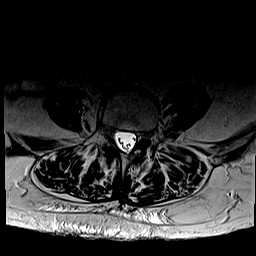
[im 16/34]
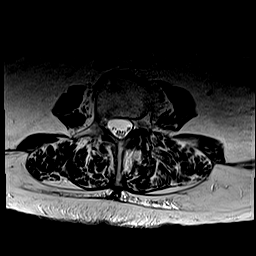
[im 18/34]
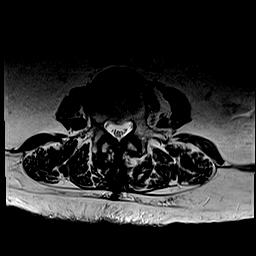
[im 23/34]
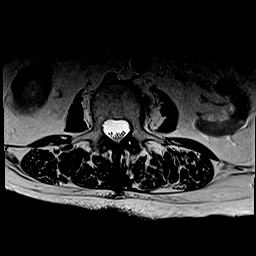
[im 28/34]
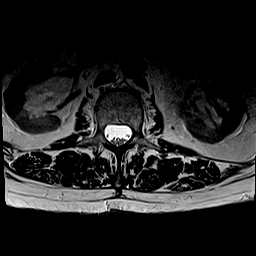
[im 34/34]
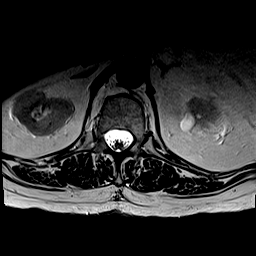

[Series 9: T1 · axial · 4.0mm · 0.39mm/px · z∈[-136,+62]mm · 8 of 34 slices shown (2 of 2)]
[im 1/34]
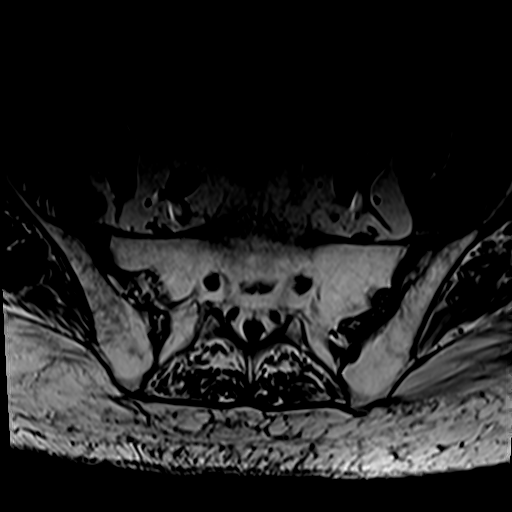
[im 6/34]
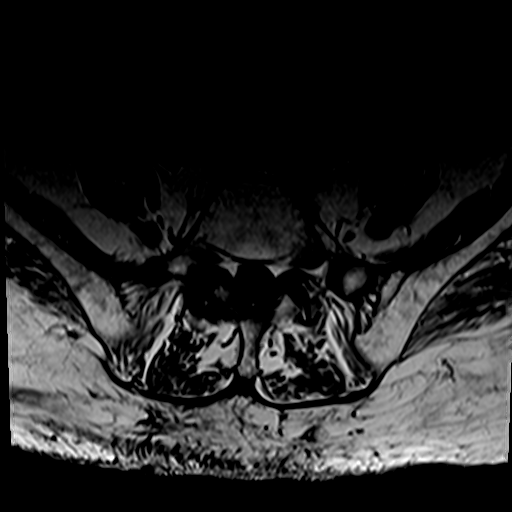
[im 11/34]
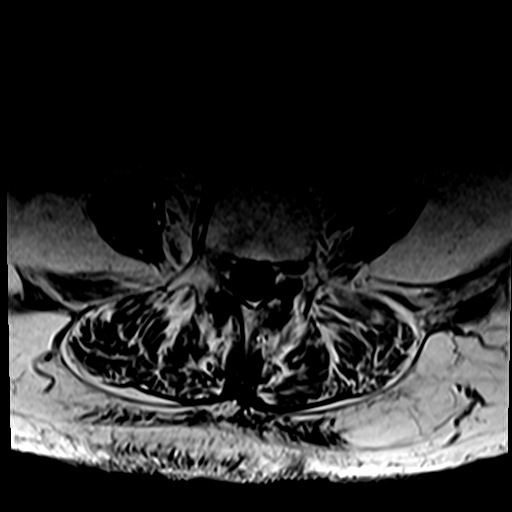
[im 16/34]
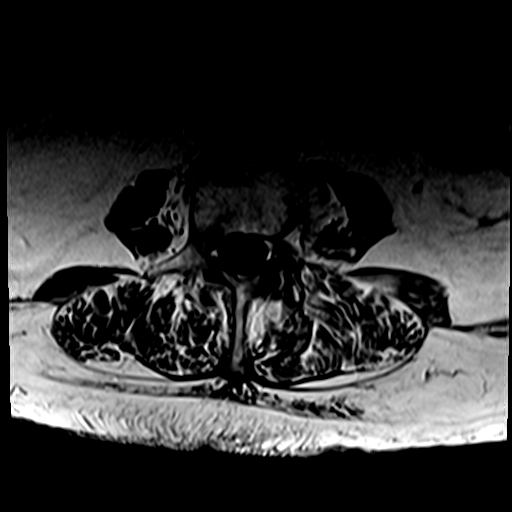
[im 18/34]
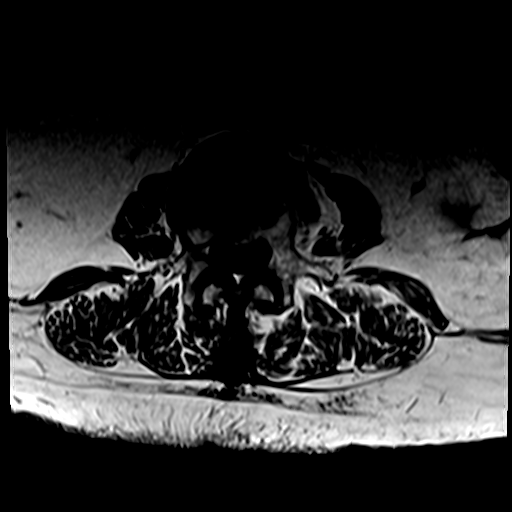
[im 23/34]
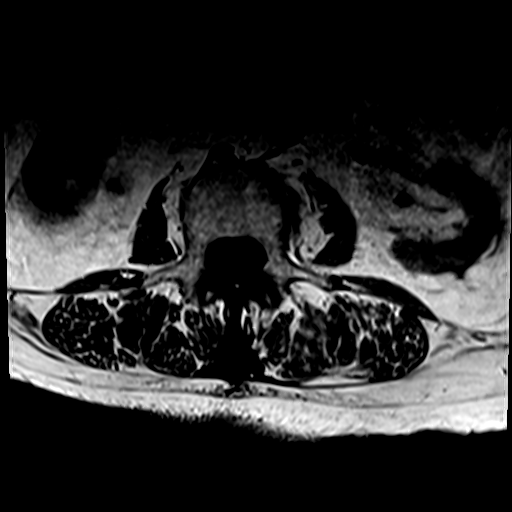
[im 28/34]
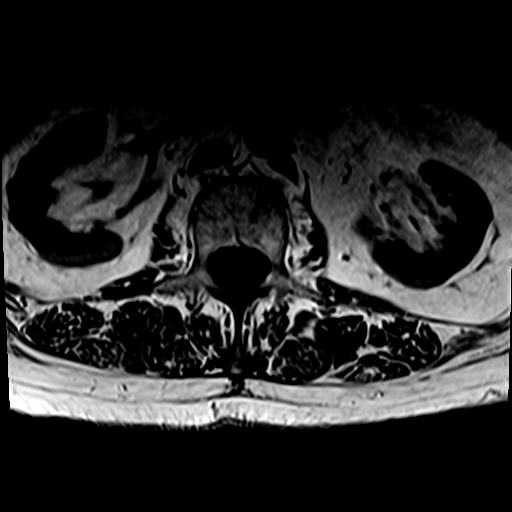
[im 34/34]
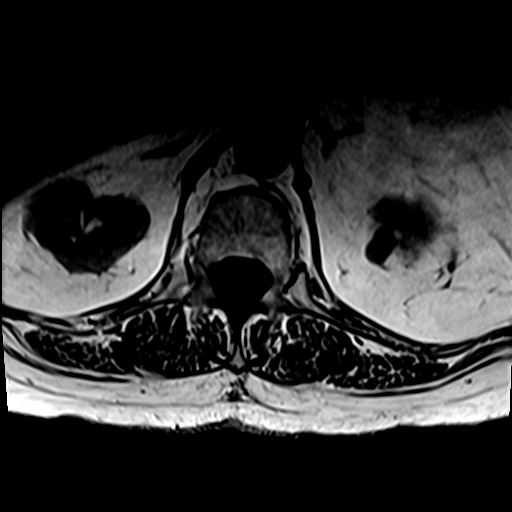

[30 of 48 positions shown; findings below may reference images not displayed]

FINDINGS: Segmentation:  5 lumbar type vertebral bodies.

Alignment: Mild scoliotic curvature convex to the right. 2 mm of
degenerative anterolisthesis at L3-4.

Vertebrae: Old healed superior endplate fracture at L3 which was
present in [DATE].

Conus medullaris and cauda equina: Conus extends to the L1 level.
Conus and cauda equina appear normal.

Paraspinal and other soft tissues: Negative

Disc levels:

Minimal non-compressive disc bulges from T10-11 through T12-L1. No
stenosis.

L1-2: Disc degeneration with circumferential bulging. Slight
indentation of the thecal sac but no compressive stenosis.

L2-3: Old superior endplate fracture at L3 as noted above. Mild
posterior bowing of the posterosuperior margin the L3 vertebral
body. Chronic appearing shallow disc protrusion more prominent in
the left posterolateral direction. Facet ligamentous hypertrophy.
Stenosis of the left lateral recess and intervertebral foramen on
the left that could possibly cause left-sided neural compression.
The facet arthritis could contribute to regional back.

L3-4: Bilateral facet osteoarthritis with 2 mm of anterolisthesis.
Bulging of the disc. Mild stenosis of both lateral recesses without
visible neural compression. The facet arthritis could be painful.

L4-5: Endplate osteophytes and mild bulging of the disc more
prominent towards the right. Mild facet and ligamentous hypertrophy.
Stenosis of the lateral recesses and foramina, right more than left.
Some potential for neural compression on the right.

L5-S1: Disc degeneration and bulging of the disc more prominent
towards the right. Mild facet osteoarthritis. No canal or
subarticular lateral recess stenosis. Mild bilateral foraminal
narrowing right more than left with some potential to affect in
particular the right L5 nerve.
IMPRESSION: Old superior endplate fracture at L3 which is healed. This was
present and healed in [DATE] based on the prior abdominal
MRI.

Lumbar region degenerative disc disease and degenerative facet
disease.

At L2-3, there is posterior bowing of the posterosuperior margin of
the L3 vertebral body, more towards the left, associated with a
shallow disc protrusion. Stenosis of the left lateral recess and
intervertebral foramen on the left could possibly cause left-sided
symptoms. The facet arthritis could be painful.

L3-4: Bilateral facet arthritis with 2 mm of anterolisthesis.
Bulging of the disc. Mild stenosis of the lateral recesses but
without visible neural compression. The facet arthritis could be
painful.

L4-5: Disc bulge and facet hypertrophy with lateral recess and
foraminal narrowing right more than left. Right-sided neural
compressive symptoms could possibly derive from the findings at this
level.

L5-S1: Disc bulge and mild facet degeneration. Encroachment upon the
neural foramina right more than left by osteophytes and bulging disc
could possibly affect the exiting L5 nerves, particularly the right.

## 2021-07-07 DIAGNOSIS — R112 Nausea with vomiting, unspecified: Secondary | ICD-10-CM | POA: Diagnosis not present

## 2021-07-07 DIAGNOSIS — R42 Dizziness and giddiness: Secondary | ICD-10-CM | POA: Diagnosis not present

## 2021-07-08 DIAGNOSIS — M5416 Radiculopathy, lumbar region: Secondary | ICD-10-CM | POA: Diagnosis not present

## 2021-07-08 DIAGNOSIS — M48062 Spinal stenosis, lumbar region with neurogenic claudication: Secondary | ICD-10-CM | POA: Diagnosis not present

## 2021-07-09 DIAGNOSIS — M5412 Radiculopathy, cervical region: Secondary | ICD-10-CM | POA: Diagnosis not present

## 2021-07-09 DIAGNOSIS — R42 Dizziness and giddiness: Secondary | ICD-10-CM | POA: Diagnosis not present

## 2021-07-09 DIAGNOSIS — G609 Hereditary and idiopathic neuropathy, unspecified: Secondary | ICD-10-CM | POA: Diagnosis not present

## 2021-07-09 DIAGNOSIS — M545 Low back pain, unspecified: Secondary | ICD-10-CM | POA: Diagnosis not present

## 2021-07-09 DIAGNOSIS — M79605 Pain in left leg: Secondary | ICD-10-CM | POA: Diagnosis not present

## 2021-07-12 ENCOUNTER — Inpatient Hospital Stay: Payer: PPO | Attending: Oncology

## 2021-07-12 DIAGNOSIS — E538 Deficiency of other specified B group vitamins: Secondary | ICD-10-CM | POA: Insufficient documentation

## 2021-07-12 MED ORDER — CYANOCOBALAMIN 1000 MCG/ML IJ SOLN
1000.0000 ug | Freq: Once | INTRAMUSCULAR | Status: AC
  Administered 2021-07-12: 1000 ug via INTRAMUSCULAR
  Filled 2021-07-12: qty 1

## 2021-07-31 DIAGNOSIS — M48062 Spinal stenosis, lumbar region with neurogenic claudication: Secondary | ICD-10-CM | POA: Diagnosis not present

## 2021-07-31 DIAGNOSIS — M5416 Radiculopathy, lumbar region: Secondary | ICD-10-CM | POA: Diagnosis not present

## 2021-08-09 ENCOUNTER — Inpatient Hospital Stay: Payer: PPO | Attending: Oncology

## 2021-08-09 DIAGNOSIS — E538 Deficiency of other specified B group vitamins: Secondary | ICD-10-CM | POA: Diagnosis not present

## 2021-08-09 MED ORDER — CYANOCOBALAMIN 1000 MCG/ML IJ SOLN
1000.0000 ug | Freq: Once | INTRAMUSCULAR | Status: AC
  Administered 2021-08-09: 1000 ug via INTRAMUSCULAR
  Filled 2021-08-09: qty 1

## 2021-08-22 DIAGNOSIS — M25579 Pain in unspecified ankle and joints of unspecified foot: Secondary | ICD-10-CM | POA: Diagnosis not present

## 2021-08-22 DIAGNOSIS — M48062 Spinal stenosis, lumbar region with neurogenic claudication: Secondary | ICD-10-CM | POA: Diagnosis not present

## 2021-08-22 DIAGNOSIS — Z933 Colostomy status: Secondary | ICD-10-CM | POA: Diagnosis not present

## 2021-08-22 DIAGNOSIS — M1712 Unilateral primary osteoarthritis, left knee: Secondary | ICD-10-CM | POA: Diagnosis not present

## 2021-08-22 DIAGNOSIS — M5416 Radiculopathy, lumbar region: Secondary | ICD-10-CM | POA: Diagnosis not present

## 2021-09-06 ENCOUNTER — Inpatient Hospital Stay: Payer: PPO

## 2021-09-17 ENCOUNTER — Inpatient Hospital Stay: Payer: PPO | Attending: Oncology

## 2021-09-17 DIAGNOSIS — M79674 Pain in right toe(s): Secondary | ICD-10-CM | POA: Diagnosis not present

## 2021-09-17 DIAGNOSIS — E538 Deficiency of other specified B group vitamins: Secondary | ICD-10-CM | POA: Insufficient documentation

## 2021-09-17 DIAGNOSIS — M25572 Pain in left ankle and joints of left foot: Secondary | ICD-10-CM | POA: Diagnosis not present

## 2021-09-17 DIAGNOSIS — M79675 Pain in left toe(s): Secondary | ICD-10-CM | POA: Diagnosis not present

## 2021-09-17 DIAGNOSIS — N1831 Chronic kidney disease, stage 3a: Secondary | ICD-10-CM | POA: Insufficient documentation

## 2021-09-17 DIAGNOSIS — M79672 Pain in left foot: Secondary | ICD-10-CM | POA: Diagnosis not present

## 2021-09-17 DIAGNOSIS — M25571 Pain in right ankle and joints of right foot: Secondary | ICD-10-CM | POA: Diagnosis not present

## 2021-09-17 DIAGNOSIS — E1142 Type 2 diabetes mellitus with diabetic polyneuropathy: Secondary | ICD-10-CM | POA: Diagnosis not present

## 2021-09-17 DIAGNOSIS — M79671 Pain in right foot: Secondary | ICD-10-CM | POA: Diagnosis not present

## 2021-09-17 DIAGNOSIS — B351 Tinea unguium: Secondary | ICD-10-CM | POA: Diagnosis not present

## 2021-09-17 DIAGNOSIS — G8929 Other chronic pain: Secondary | ICD-10-CM | POA: Diagnosis not present

## 2021-09-17 MED ORDER — CYANOCOBALAMIN 1000 MCG/ML IJ SOLN
1000.0000 ug | Freq: Once | INTRAMUSCULAR | Status: AC
  Administered 2021-09-17: 1000 ug via INTRAMUSCULAR
  Filled 2021-09-17: qty 1

## 2021-09-18 DIAGNOSIS — Z933 Colostomy status: Secondary | ICD-10-CM | POA: Diagnosis not present

## 2021-09-19 NOTE — Progress Notes (Signed)
Cardiology Office Note  Date:  09/20/2021   ID:  Sarah Potts, DOB Mar 25, 1933, MRN [id number]  PCP:  [place] Pink, MD   Chief Complaint  Patient presents with   Other    F/u 6 month f/u no complaints today. Meds reviewed verbally with pt.    HPI:  Sarah Potts is a very pleasant 88 year old woman with a history of  coronary artery disease diagnosed by cardiac catheterization in 2006,  PCI placed December 2019 in [place] to the left circumflex diabetes,  hypertension,  hyperlipidemia  Anal cancer Atrial fibrillation 2016 in [place] who presents for follow-up of her cad, PCI in 2019, atrial fibrillation  Last seen by myself in clinic October 2022 Going to vertigo PT  Lives locally, son lives in town No exercise, she is nervous to do exercise as she has  "balance issue" Good memory Neuropathy in her legs, somewhat better with over-the-counter supplement Receiving B12 shots  Several years ago,  Moved to [place] to be with her daughter Following [place] moved to [place] now back to [place]  Denies any chest pain concerning for angina 03/25/18 had chest pain while living in [place] N/V on arrival, suspected non-STEMI, taken to the catheterization lab had stent placed, [employer]. Synergy 2.5 x 16 mm to LCX  Lab work reviewed A1c 7.5 Total cholesterol 121 LDL 33 Creatinine 1.3 BUN 20 GFR 39  Denies any significant angina since that time Not taking nitro Denies any tachypalpitations concerning for atrial fibrillation episodes  While living in [place], timing unclear, reports developing tachypalpitations, seen in the hospital Diagnosed with atrial fibrillation, started on Eliquis and metoprolol Reports she is now taking Xarelto  Does not take her evening medications including her Eliquis, metoprolol among others, only remembers to take her morning pills  EKG personally reviewed by myself on todays visit Normal sinus rhythm rate 76 bpm, unable to exclude  old anterior MI   PMH:   has a past medical history of Anal cancer ([place]), Diabetes mellitus, Hyperlipidemia, and Hypertension.  PSH:    Past Surgical History:  Procedure Laterality Date   BLADDER SURGERY     CARDIAC CATHETERIZATION     COLOSTOMY     CORONARY ANGIOPLASTY WITH STENT PLACEMENT  03/25/2018   Stent to the LCX  2.50 mm x 39m  [employer] [id number]  heart stent     TONSILLECTOMY     uterus surgery     removed    Current Outpatient Medications  Medication Sig Dispense Refill   ALPRAZolam (XANAX) 0.5 MG tablet Take 1 tablet (0.5 mg total) by mouth See admin instructions. Take 1 tablet prior to MRI appointment. You may repeat another dose. thanks 2 tablet 0   amLODipine (NORVASC) 5 MG tablet Take 1 tablet (5 mg total) by mouth in the morning. 90 tablet 3   atorvastatin (LIPITOR) 20 MG tablet Take 1 tablet (20 mg total) by mouth in the morning. For cholesterol 90 tablet 3   Cholecalciferol 25 MCG (1000 UT) tablet Take by mouth. Pt takes 3 daily     ezetimibe (ZETIA) 10 MG tablet Take 1 tablet (10 mg total) by mouth in the morning. For cholesterol 90 tablet 3   Iron-Vitamin C 65-125 MG TABS Take 1 tablet by mouth daily. 30 tablet 3   losartan (COZAAR) 100 MG tablet Take 1 tablet (100 mg total) by mouth in the morning. 90 tablet 3   magnesium oxide (MAG-OX) 400 MG tablet  Take by mouth.     metFORMIN (GLUMETZA) 1000 MG (MOD) 24 hr tablet Take 1,000 mg by mouth 2 (two) times daily with a meal.       metoprolol succinate (TOPROL-XL) 50 MG 24 hr tablet Take 1 tablet (50 mg total) by mouth daily. 90 tablet 3   mirabegron ER (MYRBETRIQ) 50 MG TB24 tablet Take by mouth.     montelukast (SINGULAIR) 10 MG tablet Take 10 mg by mouth at bedtime.       nitroGLYCERIN (NITRODUR - DOSED IN MG/24 HR) 0.2 mg/hr patch PLEASE SEE ATTACHED FOR DETAILED DIRECTIONS     omega-3 acid ethyl esters (LOVAZA) 1 g capsule Take 2 g by mouth 2 (two) times daily.        omeprazole (PRILOSEC) 40 MG capsule Take 40 mg by mouth daily.       pregabalin (LYRICA) 75 MG capsule Take 75 mg by mouth 2 (two) times daily.     Rivaroxaban (XARELTO) 15 MG TABS tablet Take 1 tablet (15 mg total) by mouth daily. 90 tablet 3   sertraline (ZOLOFT) 50 MG tablet Take 1 tablet by mouth daily. Pt taking 1/2 daily     traZODone (DESYREL) 50 MG tablet Take 1 tablet by mouth at bedtime.     TRULICITY 1.5 FO/2.7XA SOPN Inject into the skin once a week.     No current facility-administered medications for this visit.    Allergies:   Levofloxacin, Niacin, and Niacin and related   Social History:  The patient  reports that she has never smoked. She has never used smokeless tobacco. She reports that she does not drink alcohol and does not use drugs.   Family History:   family history includes Breast cancer in her mother; Cancer in her mother and sister; Cervical cancer in her sister; Heart attack in her father; Heart disease in her brother, father, and sister.    Review of Systems: Review of Systems  Constitutional: Negative.   HENT: Negative.    Respiratory: Negative.    Cardiovascular: Negative.   Gastrointestinal: Negative.   Musculoskeletal: Negative.   Neurological: Negative.   Psychiatric/Behavioral: Negative.    All other systems reviewed and are negative.   PHYSICAL EXAM: VS:  BP (!) 104/50 (BP Location: Left Arm, Patient Position: Sitting, Cuff Size: Normal)   Pulse 76   Ht '5\' 2"'$  (1.575 m)   Wt 172 lb 8 oz (78.2 kg)   SpO2 97%   BMI 31.55 kg/m  , BMI Body mass index is 31.55 kg/m. Constitutional:  oriented to person, place, and time. No distress.  HENT:  Head: Grossly normal Eyes:  no discharge. No scleral icterus.  Neck: No JVD, no carotid bruits  Cardiovascular: Regular rate and rhythm, no murmurs appreciated Pulmonary/Chest: Clear to auscultation bilaterally, no wheezes or rails Abdominal: Soft.  no distension.  no tenderness.  Musculoskeletal: Normal  range of motion Neurological:  normal muscle tone. Coordination normal. No atrophy Skin: Skin warm and dry Psychiatric: normal affect, pleasant   Recent Labs: 12/20/2020: ALT 13; BUN 17; Creatinine, Ser 1.14; Potassium 4.5; Sodium 133 04/10/2021: Hemoglobin 11.5; Platelets 217    Lipid Panel No results found for: "CHOL", "HDL", "LDLCALC", "TRIG"    Wt Readings from Last 3 Encounters:  09/20/21 172 lb 8 oz (78.2 kg)  04/12/21 173 lb 4.8 oz (78.6 kg)  02/05/21 171 lb (77.6 kg)     ASSESSMENT AND PLAN:  Problem List Items Addressed This Visit  Cardiology Problems   Hyperlipidemia   CAD (coronary artery disease) - Primary   Relevant Orders   EKG 12-Lead   HTN (hypertension)   Relevant Orders   EKG 12-Lead     Other   Stage 3a chronic kidney disease ([place])   Relevant Orders   EKG 12-Lead   Diabetes mellitus ([place])   Relevant Orders   EKG 12-Lead  Coronary disease with stable angina Stent placed 2019 left circumflex [employer] Synergy stent Reports aspirin was held in the setting of Xarelto for atrial fibrillation Eliquis previously changed to Xarelto for medication compliance Creatinine clearance less than 50, on reduced dose Xarelto 15 mg daily  Paroxysmal atrial fibrillation Maintaining normal sinus rhythm Xarelto 15 mg daily  Essential hypertension Metoprolol succinate losartan and amlodipine in the morning Does not like taking evening medication Blood pressure low but stable, she is monitoring at home  Hyperlipidemia Recommend she continue on Lipitor Zetia in the morning Does not like taking evening medication    Total encounter time more than 30 minutes  Greater than 50% was spent in counseling and coordination of care with the patient    Signed, Esmond Plants, M.D., Ph.D. [place], [place]

## 2021-09-20 ENCOUNTER — Encounter: Payer: Self-pay | Admitting: Cardiovascular Disease

## 2021-09-20 ENCOUNTER — Ambulatory Visit: Payer: PPO | Admitting: Cardiovascular Disease

## 2021-09-20 ENCOUNTER — Ambulatory Visit: Payer: PPO | Attending: Family Medicine

## 2021-09-20 VITALS — BP 104/50 | HR 76 | Ht 62.0 in | Wt 172.5 lb

## 2021-09-20 DIAGNOSIS — N1831 Chronic kidney disease, stage 3a: Secondary | ICD-10-CM | POA: Diagnosis not present

## 2021-09-20 DIAGNOSIS — I10 Essential (primary) hypertension: Secondary | ICD-10-CM | POA: Diagnosis not present

## 2021-09-20 DIAGNOSIS — R42 Dizziness and giddiness: Secondary | ICD-10-CM | POA: Diagnosis not present

## 2021-09-20 DIAGNOSIS — I25118 Atherosclerotic heart disease of native coronary artery with other forms of angina pectoris: Secondary | ICD-10-CM | POA: Diagnosis not present

## 2021-09-20 DIAGNOSIS — R2681 Unsteadiness on feet: Secondary | ICD-10-CM | POA: Diagnosis not present

## 2021-09-20 DIAGNOSIS — E782 Mixed hyperlipidemia: Secondary | ICD-10-CM | POA: Diagnosis not present

## 2021-09-20 DIAGNOSIS — E1159 Type 2 diabetes mellitus with other circulatory complications: Secondary | ICD-10-CM

## 2021-09-20 NOTE — Therapy (Signed)
[place] MAIN [facility] SERVICES [place], [place], [address] Phone: [phone]   Fax:  [phone]  Physical Therapy Evaluation  Patient Details  Name: Sarah Potts MRN: [id number] Date of Birth: 08-24-1932 No data recorded  Encounter Date: 09/20/2021   PT End of Session - 09/20/21 1146     Visit Number 1    Number of Visits 17    Date for PT Re-Evaluation 11/15/21    PT Start Time 1016    PT Stop Time 1113    PT Time Calculation (min) 57 min    Equipment Utilized During Treatment Gait belt    Activity Tolerance Patient tolerated treatment well    Behavior During Therapy WFL for tasks assessed/performed             Past Medical History:  Diagnosis Date   Anal cancer ([place])    Diabetes mellitus    Hyperlipidemia    Hypertension     Past Surgical History:  Procedure Laterality Date   [place] WITH STENT PLACEMENT  03/25/2018   Stent to the LCX  2.50 mm x 34m  [employer] [id number]  heart stent     TONSILLECTOMY     uterus surgery     removed    There were no vitals filed for this visit.    Subjective Assessment - 09/20/21 1134     Subjective Pt is a pleasant 88y/o female presenting to PT for dizziness. She is ambulating without an AD. Pt reports hx of BPPV. She had new onset of dizziness that started in March and states she was "dizzy all the time." Pt was prescribed meclizine and reports significant improvement in dizziness since taking medication. She now only feels brief dizziness when rolling in her bed or bending to pick something up. She describes her symptoms as feeling as if she is spinning and that it lasts for seconds. She had vomited in the past but reports no nausea or vomiting recently. Pt denies any falls in the past six months. Pt is not entirely sure she "needs physical therapy" but  wanted to follow up regarding lingering symptoms. Pt denies any spontaneous symptoms. Pt has difficulty hearing and has an upcoming appointment to get hearing aids. Pt has been seen by a neurologist and reports nothing was found to be "wrong with my mind."  Pt wears glasses. She has chronic n/t in hands and feet due to neuropathy. Pt with chronic neck pain with movement.    Pertinent History Pt is a pleasant 88y/o female presenting to PT for dizziness. She is ambulating without an AD. Pt reports hx of BPPV. She had new onset of dizziness that started in March and states she was "dizzy all the time." Pt was prescribed meclizine and reports significant improvement in dizziness since taking medication. She now only feels brief dizziness when rolling in her bed or bending to pick something up. She describes her symptoms as feeling as if she is spinning and that it lasts for seconds. She had vomited in the past but reports no nausea or vomiting recently. Pt denies any falls in the past six months. Pt is not entirely sure she "needs physical therapy" but wanted to follow up regarding lingering symptoms. Pt denies any spontaneous symptoms. Pt has difficulty hearing and has an upcoming appointment to get  hearing aids. Pt has been seen by a neurologist and reports nothing was found to be "wrong with my mind."  Pt wears glasses. She has chronic n/t in hands and feet due to neuropathy. Pt with chronic neck pain with movement. PMH significant for abdominal hernia, arthritis, asthma, breast tumor (benign), cerebrovascular disease, coronary artery disease, depression, diabetes, dyslipidemia, gastritis, CA, cataract, myocardial infarction, HTN, sleep apnea, idiopathic peripheral neuropathy, low back pain radiating to L leg, L cervical radiculopathy, B12 deficiency    Limitations House hold activities;Walking;Lifting   lifting limited with bending and standing up   How long can you sit comfortably? n/a    Diagnostic tests CT  HEAD WO CONTRAST 06/03/21: "IMPRESSION:  Stable atrophy pattern and white matter microvascular ischemic  changes.     No interval change or acute process by noncontrast CT." no other recent pertinent imaging in chart    Patient Stated Goals Resolve lingering symptoms    Currently in Pain? No/denies            VESTIBULAR AND BALANCE EVALUATION   HISTORY:  Subjective: Pt with dizziness with rolling, bending. See subjective for details.   Description of dizziness: (vertigo, unsteadiness, lightheadedness, falling, general unsteadiness, whoozy, swimmy-headed sensation, aural fullness) -Pt feels lightheaded once in a while. Pt reports unsteadiness, "when I get up to walk it takes me a while" she thinks this is due to neuropathy. Pt reports sometimes feeling as if there is "bubble wrap" in ears when applying pressure to side of her head such as when lying on her side. Pt with ringing in both ears (chronic).   Frequency: only with certain movements Duration: seconds Symptom nature: (motion provoked, positional, spontaneous, constant, variable, intermittent): motion provoked Bending over to pick up an object, rolling brings on symptoms. Pt denies dizziness with busy spaces, watching TV.  Pt does wear glasses but reports blurriness with reading, is unsure why. Has appointment with eye doctor coming up.  Easing Factors: meclizine, closing her eyes and staying still  Progression of symptoms: (better, worse, no change since onset): better History of similar episodes: yes, has been diagnosed with BPPV in the past with similar episodes per pt  Falls (yes/no): no Number of falls in past 6 months: none  Prior Functional Level: indep  Auditory complaints (tinnitus, pain, drainage, hearing loss, aural fullness):  Pt reports chronic tinnitus bilaterally. Occasional ear pain, reports "holes in eardrum" from childhood.   Vision (diplopia, visual field loss, recent changes, last eye exam): denies  diplopia, recent changes. Pt reports once in a while feeling as if she has a "film" over her eye and things "get dimmer." She says this is brief. She has not had this recently. She reports cataract surgery in the past.  Red Flags: (dysarthria, dysphagia, drop attacks, bowel and bladder changes, recent weight loss/gain)  Review of systems negative for red flags.     EXAMINATION  POSTURE: forward head posture, thoracic kyphosis   SOMATOSENSORY: n/t in hands and feet, pt reports she has neuropathy due to DM    COORDINATION: deferred  MUSCULOSKELETAL SCREEN: Cervical Spine ROM: Impaired lateral flexion and rotation and painful (reports this is chronic issue)   MMT: deferred  Gait: Pt exhibits mild unsteadiness with ambulating with head turns, is observed to ambulate with decreased step-length. See DGI score below   OCULOMOTOR / VESTIBULAR TESTING:  Oculomotor Exam- Room Light  Findings Comments  Ocular Alignment normal   Ocular ROM normal   Spontaneous Nystagmus normal   Gaze-Holding  Nystagmus normal   End-Gaze Nystagmus normal   Vergence (normal 2-3") normal   Smooth Pursuit abnormal saccadic  Cross-Cover Test not examined   Saccades normal   VOR Cancellation not examined   Left Head Impulse not examined Deferred to future session if appropriate. Not tested on this date due to neck pain  Right Head Impulse not examined Deferred to future session if appropriate. Not tested on this date due to neck pain  Static Acuity normal   Dynamic Acuity normal    BPPV TESTS:  Symptoms Duration Intensity Nystagmus  L Dix-Hallpike Yes Approx 2 minutes severe Jerk nystagmus, appears downbeating, takes minutes to fatigue  R Dix-Hallpike Yes <1 minute moderate Jerk nystagmus, downbeating, fatigues in approx 1 minute    [place] to L side x1. PT instructed pt in safety measures following maneuver, such as not attempting anything today challenging to her balance. Pt verbalized understanding.    FUNCTIONAL OUTCOME MEASURES   Results Comments      DGI 20/24       [place] 34/100 Indicates low perception of handicap    ASSESSMENT Clinical Impression: Pt is a pleasant 88 year-old female referred for dizziness. PT examination reveals abnormal smooth pursuits, impaired and painful cervical lateral flexion and rotation bilaterally, low perception of handicap due to dizziness as indicated by [place] (34/100), and mild unsteadiness with turning and head turns during gait when performing the DGI. Pt with severe dizziness (reports spinning sensation) with L Dix-Hallpike and moderate dizziness with R Dix-Hallpike. Pt symptoms per subjective are suggestive of BPPV, however, with Dix-Hallpike testing pt exhibited possible downbeat nystagmus when testing both sides. Nystagmus persisted for >2 min on L and <1 min on R. PT did provide [place] to L side due to symptom reports consistent with possible BPPV and dizziness reported with Dix-Hallpike. Pt presentation does include central indicators, however. PT and pt discussed follow-up visit as needed if dizziness continues. The pt verbalized understanding and is agreeable to plan.The pt will benefit from further skilled PT to improve positionally provoked dizziness to increase QOL and decrease risk of falls.     PLAN Next Visit: repeat maneuver as indicated/appropriate    Rationale for Evaluation and Treatment Rehabilitation      [facility] PT Assessment - 09/20/21 1108       Assessment   Medical Diagnosis Dizziness and giddiness      Balance Screen   Has the patient fallen in the past 6 months No      [place] residence    Living Arrangements Alone    Available Help at Discharge Friend(s)    Type of [place] to enter;Elevator    Entrance [place] of Steps [place] - 2 wheels;Cane - single point;Grab bars - toilet;Grab bars -  tub/shower      Prior Function   Level of Independence Independent      Standardized Balance Assessment   Standardized Balance Assessment Dynamic Gait Index      Dynamic Gait Index   Level Surface Normal    Change in Gait Speed Normal    Gait with Horizontal Head Turns Mild Impairment    Gait with Vertical Head Turns Mild Impairment    Gait and Pivot Turn Mild Impairment    Step Over Obstacle Normal    Step Around Obstacles Normal    Steps Mild Impairment  Total Score 20                        Objective measurements completed on examination: See above findings.                PT Education - 09/20/21 1146     Education Details exam findings, goals, plan for follow-up or further visits as needed    Person(s) Educated Patient    Methods Explanation    Comprehension Verbalized understanding              PT Short Term Goals - 09/20/21 1210       PT SHORT TERM GOAL #1   Title Pt will be independent with HEP in order to improve strength and balance in order to decrease fall risk and improve function at home and work.    Baseline 6/9: deferred to next session if appropriate    Time 4    Period Weeks    Status New    Target Date 10/18/21               PT Long Term Goals - 09/20/21 1210       PT LONG TERM GOAL #1   Title Pt will improve DGI by at least 3 points in order to demonstrate clinically significant improvement in balance and decreased risk for falls.    Baseline 6/9: 20/24    Time 8    Period Weeks    Status New    Target Date 11/15/21      PT LONG TERM GOAL #2   Title Pt will report no dizziness within the past week when rolling over in her bed or when bending to pick something up to improve QOL and ease/safety with ADLs.    Baseline 6/9: Pt currently has dizziness with these activities    Time 8    Period Weeks    Status New    Target Date 11/15/21      PT LONG TERM GOAL #3   Title Pt will decrease DHI score by at  least 18 points in order to demonstrate clinically significant reduction in disability    Baseline 6/9: 34/100    Time 8    Period Weeks    Status New    Target Date 11/15/21                    Plan - 09/20/21 1207     Clinical Impression Statement Pt is a pleasant 88 year-old female referred for dizziness. PT examination reveals abnormal smooth pursuits, impaired and painful cervical lateral flexion and rotation bilaterally, low perception of handicap due to dizziness as indicated by [place] (34/100), and mild unsteadiness with turning and head turns during gait when performing the DGI. Pt with severe dizziness (reports spinning sensation) with L Dix-Hallpike and moderate dizziness with R Dix-Hallpike. Pt symptoms per subjective are suggestive of BPPV, however, with Dix-Hallpike testing pt exhibited possible downbeat nystagmus when testing both sides. Nystagmus persisted for >2 min on L and <1 min on R. PT did provide Epley to L side due to symptom reports consistent with possible BPPV and dizziness reported with Dix-Hallpike. Pt presentation does include central indicators, however. PT and pt discussed follow-up visit as needed if dizziness continues. The pt verbalized understanding and is agreeable to plan.The pt will benefit from further skilled PT to improve positionally provoked dizziness to increase QOL and decrease risk of falls.  Personal Factors and Comorbidities Age;Past/Current Experience;Sex;Time since onset of injury/illness/exacerbation;Comorbidity 3+    Comorbidities PMH significant for abdominal hernia, arthritis, asthma, breast tumor (benign), cerebrovascular disease, coronary artery disease, depression, diabetes, dyslipidemia, gastritis, CA, cataract, myocardial infarction, HTN, sleep apnea, idiopathic peripheral neuropathy, low back pain radiating to L leg, L cervical radiculopathy, B12 deficiency    Examination-Activity Limitations Bed Mobility;Bend;Locomotion Level;Stairs     Examination-Participation Restrictions Other;Laundry;Yard Work    [employer]chant [employer] officer Evolving/Moderate complexity    Clinical Decision Making Moderate    Rehab Potential Good    PT Frequency 2x / week    PT Duration 8 weeks    PT Treatment/Interventions ADLs/Self Care Home Management;Canalith Repostioning;Cryotherapy;Moist Heat;Electrical Stimulation;Traction;DME Instruction;Gait training;Stair training;Functional mobility training;Therapeutic activities;Therapeutic exercise;Balance training;Neuromuscular re-education;Patient/family education;Orthotic Fit/Training;Manual techniques;Passive range of motion;Dry needling;Energy conservation;Taping;Vestibular;Visual/perceptual remediation/compensation;Joint Manipulations    PT Next Visit Plan repeat maneuver as indicated/appropriate    PT Home Exercise Plan provide next 1-2 visits if indicated    Consulted and Agree with Plan of Care Patient             Patient will benefit from skilled therapeutic intervention in order to improve the following deficits and impairments:  Abnormal gait, Decreased balance, Dizziness, Hypomobility, Pain, Improper body mechanics, Impaired sensation, Postural dysfunction, Impaired flexibility, Decreased mobility  Visit Diagnosis: Dizziness and giddiness  Unsteadiness on feet     Problem List Patient Active Problem List   Diagnosis Date Noted   Absolute anemia 01/09/2021   Lung nodule 01/09/2021   Anemia in stage 3a chronic kidney disease ([place]) 01/09/2021   B12 deficiency 01/09/2021   Stage 3a chronic kidney disease ([place]) 01/09/2021   Goals of care, counseling/discussion 12/18/2020   History of anal cancer 12/11/2020   Bronchitis 12/15/2012   CAD (coronary artery disease) 11/13/2010   HTN (hypertension) 11/13/2010   Diabetes mellitus ([place]) 11/13/2010   Hyperlipidemia 11/13/2010   Obesity 11/13/2010    Zollie Pee, PT 09/20/2021, 12:13 PM  [place] [address] [place], [place], [address] Phone: [phone]   Fax:  [phone]  Name: DELITHA ELMS MRN: [id number] Date of Birth: 1932-08-25

## 2021-09-20 NOTE — Patient Instructions (Signed)
Medication Instructions:  No changes  If you need a refill on your cardiac medications before your next appointment, please call your pharmacy.   Lab work: No new labs needed  Testing/Procedures: No new testing needed  Follow-Up: At [facility], you and your health needs are our priority.  As part of our continuing mission to provide you with exceptional heart care, we have created designated Provider Care Teams.  These Care Teams include your primary Cardiologist (physician) and Advanced Practice Providers (APPs -  Physician Assistants and Nurse Practitioners) who all work together to provide you with the care you need, when you need it.  You will need a follow up appointment in 12 months  Providers on your designated Care Team:   Christopher Berge, NP Ryan Dunn, PA-C Cadence Furth, PA-C  COVID-19 Vaccine Information can be found at: [email or web address] For questions related to vaccine distribution or appointments, please email [email or web address] or call [phone].   

## 2021-09-27 ENCOUNTER — Ambulatory Visit: Payer: PPO

## 2021-10-04 ENCOUNTER — Ambulatory Visit
Admission: RE | Admit: 2021-10-04 | Discharge: 2021-10-04 | Disposition: A | Discharge status: Home / Self Care | Payer: PPO | Source: Ambulatory Visit | Attending: Oncology | Admitting: Oncology

## 2021-10-04 ENCOUNTER — Ambulatory Visit: Payer: PPO

## 2021-10-04 ENCOUNTER — Inpatient Hospital Stay: Payer: PPO

## 2021-10-04 DIAGNOSIS — Z85048 Personal history of other malignant neoplasm of rectum, rectosigmoid junction, and anus: Secondary | ICD-10-CM

## 2021-10-04 DIAGNOSIS — R911 Solitary pulmonary nodule: Secondary | ICD-10-CM | POA: Insufficient documentation

## 2021-10-04 DIAGNOSIS — E538 Deficiency of other specified B group vitamins: Secondary | ICD-10-CM

## 2021-10-04 DIAGNOSIS — D631 Anemia in chronic kidney disease: Secondary | ICD-10-CM

## 2021-10-04 LAB — CBC WITH DIFFERENTIAL/PLATELET
Abs Immature Granulocytes: 0.03 10*3/uL (ref 0.00–0.07)
Basophils Absolute: 0 10*3/uL (ref 0.0–0.1)
Basophils Relative: 1 %
Eosinophils Absolute: 0.1 10*3/uL (ref 0.0–0.5)
Eosinophils Relative: 2 %
HCT: 35.8 % — ABNORMAL LOW (ref 36.0–46.0)
Hemoglobin: 11.6 g/dL — ABNORMAL LOW (ref 12.0–15.0)
Immature Granulocytes: 1 %
Lymphocytes Relative: 30 %
Lymphs Abs: 1.4 10*3/uL (ref 0.7–4.0)
MCH: 30.4 pg (ref 26.0–34.0)
MCHC: 32.4 g/dL (ref 30.0–36.0)
MCV: 94 fL (ref 80.0–100.0)
Monocytes Absolute: 0.4 10*3/uL (ref 0.1–1.0)
Monocytes Relative: 9 %
Neutro Abs: 2.7 10*3/uL (ref 1.7–7.7)
Neutrophils Relative %: 57 %
Platelets: 240 10*3/uL (ref 150–400)
RBC: 3.81 MIL/uL — ABNORMAL LOW (ref 3.87–5.11)
RDW: 14.2 % (ref 11.5–15.5)
WBC: 4.7 10*3/uL (ref 4.0–10.5)
nRBC: 0 % (ref 0.0–0.2)

## 2021-10-04 LAB — COMPREHENSIVE METABOLIC PANEL
ALT: 18 U/L (ref 0–44)
AST: 25 U/L (ref 15–41)
Albumin: 4 g/dL (ref 3.5–5.0)
Alkaline Phosphatase: 50 U/L (ref 38–126)
Anion gap: 8 (ref 5–15)
BUN: 23 mg/dL (ref 8–23)
CO2: 25 mmol/L (ref 22–32)
Calcium: 9.3 mg/dL (ref 8.9–10.3)
Chloride: 104 mmol/L (ref 98–111)
Creatinine, Ser: 1.23 mg/dL — ABNORMAL HIGH (ref 0.44–1.00)
GFR, Estimated: 43 mL/min — ABNORMAL LOW (ref >60)
Glucose, Bld: 136 mg/dL — ABNORMAL HIGH (ref 70–99)
Potassium: 4.9 mmol/L (ref 3.5–5.1)
Sodium: 137 mmol/L (ref 135–145)
Total Bilirubin: 0.9 mg/dL (ref 0.3–1.2)
Total Protein: 7.8 g/dL (ref 6.5–8.1)

## 2021-10-04 LAB — IRON AND TIBC
Iron: 62 ug/dL (ref 28–170)
Saturation Ratios: 16 % (ref 10.4–31.8)
TIBC: 386 ug/dL (ref 250–450)
UIBC: 324 ug/dL

## 2021-10-04 LAB — VITAMIN B12: Vitamin B-12: 446 pg/mL (ref 180–914)

## 2021-10-04 LAB — FERRITIN: Ferritin: 9 ng/mL — ABNORMAL LOW (ref 11–307)

## 2021-10-08 ENCOUNTER — Encounter: Payer: PPO | Admitting: Physical Therapy

## 2021-10-08 ENCOUNTER — Other Ambulatory Visit: Payer: Self-pay

## 2021-10-08 DIAGNOSIS — E1165 Type 2 diabetes mellitus with hyperglycemia: Secondary | ICD-10-CM | POA: Diagnosis not present

## 2021-10-08 DIAGNOSIS — E1140 Type 2 diabetes mellitus with diabetic neuropathy, unspecified: Secondary | ICD-10-CM | POA: Diagnosis not present

## 2021-10-08 DIAGNOSIS — E1122 Type 2 diabetes mellitus with diabetic chronic kidney disease: Secondary | ICD-10-CM | POA: Diagnosis not present

## 2021-10-08 DIAGNOSIS — E1159 Type 2 diabetes mellitus with other circulatory complications: Secondary | ICD-10-CM | POA: Diagnosis not present

## 2021-10-08 DIAGNOSIS — I208 Other forms of angina pectoris: Secondary | ICD-10-CM | POA: Diagnosis not present

## 2021-10-08 DIAGNOSIS — C210 Malignant neoplasm of anus, unspecified: Secondary | ICD-10-CM | POA: Diagnosis not present

## 2021-10-08 DIAGNOSIS — I4891 Unspecified atrial fibrillation: Secondary | ICD-10-CM | POA: Diagnosis not present

## 2021-10-08 DIAGNOSIS — E1169 Type 2 diabetes mellitus with other specified complication: Secondary | ICD-10-CM | POA: Diagnosis not present

## 2021-10-08 DIAGNOSIS — E538 Deficiency of other specified B group vitamins: Secondary | ICD-10-CM

## 2021-10-08 DIAGNOSIS — Z933 Colostomy status: Secondary | ICD-10-CM | POA: Diagnosis not present

## 2021-10-08 DIAGNOSIS — D6869 Other thrombophilia: Secondary | ICD-10-CM | POA: Diagnosis not present

## 2021-10-08 DIAGNOSIS — C189 Malignant neoplasm of colon, unspecified: Secondary | ICD-10-CM | POA: Diagnosis not present

## 2021-10-08 DIAGNOSIS — E1142 Type 2 diabetes mellitus with diabetic polyneuropathy: Secondary | ICD-10-CM | POA: Diagnosis not present

## 2021-10-08 DIAGNOSIS — N1831 Chronic kidney disease, stage 3a: Secondary | ICD-10-CM

## 2021-10-09 ENCOUNTER — Inpatient Hospital Stay: Payer: PPO

## 2021-10-09 DIAGNOSIS — D631 Anemia in chronic kidney disease: Secondary | ICD-10-CM

## 2021-10-09 DIAGNOSIS — E538 Deficiency of other specified B group vitamins: Secondary | ICD-10-CM

## 2021-10-09 DIAGNOSIS — N1831 Chronic kidney disease, stage 3a: Secondary | ICD-10-CM

## 2021-10-09 LAB — IRON AND TIBC
Iron: 77 ug/dL (ref 28–170)
Saturation Ratios: 22 % (ref 10.4–31.8)
TIBC: 350 ug/dL (ref 250–450)
UIBC: 273 ug/dL

## 2021-10-09 LAB — COMPREHENSIVE METABOLIC PANEL
ALT: 17 U/L (ref 0–44)
AST: 23 U/L (ref 15–41)
Albumin: 3.6 g/dL (ref 3.5–5.0)
Alkaline Phosphatase: 44 U/L (ref 38–126)
Anion gap: 5 (ref 5–15)
BUN: 14 mg/dL (ref 8–23)
CO2: 25 mmol/L (ref 22–32)
Calcium: 8.1 mg/dL — ABNORMAL LOW (ref 8.9–10.3)
Chloride: 109 mmol/L (ref 98–111)
Creatinine, Ser: 1.22 mg/dL — ABNORMAL HIGH (ref 0.44–1.00)
GFR, Estimated: 43 mL/min — ABNORMAL LOW (ref >60)
Glucose, Bld: 164 mg/dL — ABNORMAL HIGH (ref 70–99)
Potassium: 4.5 mmol/L (ref 3.5–5.1)
Sodium: 139 mmol/L (ref 135–145)
Total Bilirubin: 0.8 mg/dL (ref 0.3–1.2)
Total Protein: 6.7 g/dL (ref 6.5–8.1)

## 2021-10-09 LAB — CBC WITH DIFFERENTIAL/PLATELET
Abs Immature Granulocytes: 0.02 10*3/uL (ref 0.00–0.07)
Basophils Absolute: 0 10*3/uL (ref 0.0–0.1)
Basophils Relative: 1 %
Eosinophils Absolute: 0.1 10*3/uL (ref 0.0–0.5)
Eosinophils Relative: 2 %
HCT: 35.3 % — ABNORMAL LOW (ref 36.0–46.0)
Hemoglobin: 10.9 g/dL — ABNORMAL LOW (ref 12.0–15.0)
Immature Granulocytes: 1 %
Lymphocytes Relative: 27 %
Lymphs Abs: 1.1 10*3/uL (ref 0.7–4.0)
MCH: 29.1 pg (ref 26.0–34.0)
MCHC: 30.9 g/dL (ref 30.0–36.0)
MCV: 94.1 fL (ref 80.0–100.0)
Monocytes Absolute: 0.3 10*3/uL (ref 0.1–1.0)
Monocytes Relative: 7 %
Neutro Abs: 2.5 10*3/uL (ref 1.7–7.7)
Neutrophils Relative %: 62 %
Platelets: 209 10*3/uL (ref 150–400)
RBC: 3.75 MIL/uL — ABNORMAL LOW (ref 3.87–5.11)
RDW: 14.3 % (ref 11.5–15.5)
WBC: 4 10*3/uL (ref 4.0–10.5)
nRBC: 0 % (ref 0.0–0.2)

## 2021-10-09 LAB — FERRITIN: Ferritin: 7 ng/mL — ABNORMAL LOW (ref 11–307)

## 2021-10-09 LAB — VITAMIN B12: Vitamin B-12: 333 pg/mL (ref 180–914)

## 2021-10-11 ENCOUNTER — Inpatient Hospital Stay: Payer: PPO | Admitting: Oncology

## 2021-10-11 ENCOUNTER — Encounter: Payer: Self-pay | Admitting: Oncology

## 2021-10-11 ENCOUNTER — Inpatient Hospital Stay: Payer: PPO

## 2021-10-11 ENCOUNTER — Ambulatory Visit: Payer: PPO

## 2021-10-11 VITALS — BP 144/79 | HR 76 | Temp 97.6°F | Resp 18

## 2021-10-11 DIAGNOSIS — E538 Deficiency of other specified B group vitamins: Secondary | ICD-10-CM | POA: Diagnosis not present

## 2021-10-11 DIAGNOSIS — D508 Other iron deficiency anemias: Secondary | ICD-10-CM

## 2021-10-11 DIAGNOSIS — D631 Anemia in chronic kidney disease: Secondary | ICD-10-CM

## 2021-10-11 DIAGNOSIS — N1831 Chronic kidney disease, stage 3a: Secondary | ICD-10-CM

## 2021-10-11 DIAGNOSIS — Z85048 Personal history of other malignant neoplasm of rectum, rectosigmoid junction, and anus: Secondary | ICD-10-CM

## 2021-10-11 DIAGNOSIS — R911 Solitary pulmonary nodule: Secondary | ICD-10-CM

## 2021-10-11 MED ORDER — CYANOCOBALAMIN 1000 MCG/ML IJ SOLN
1000.0000 ug | Freq: Once | INTRAMUSCULAR | Status: AC
  Administered 2021-10-11: 1000 ug via INTRAMUSCULAR
  Filled 2021-10-11: qty 1

## 2021-10-11 NOTE — Progress Notes (Unsigned)
Patient denies any concerns today.  

## 2021-10-12 ENCOUNTER — Encounter: Payer: Self-pay | Admitting: Oncology

## 2021-10-12 NOTE — Progress Notes (Signed)
Hematology/Oncology Progress note [phone] [phone]      Patient Care Team: Maryland Pink, MD as PCP - General (Family Medicine) Minna Merritts, MD as PCP - Cardiology (Cardiology) [name], MD as Consulting Physician (Hematology and Oncology)  REFERRING PROVIDER: Maryland Pink, MD  CHIEF COMPLAINTS/REASON FOR VISIT:  Follow up  history of anal cancer  HISTORY OF PRESENTING ILLNESS:   Sarah Potts is a  88 y.o.  female with PMH listed below was seen in consultation at the request of  Maryland Pink, MD  for evaluation of history of anal cancer  Patient reports history of anal cancer in 2017. I obtained previous oncology records and extensive medical record review was performed by me  Patient has noticed a lesion around her anus for about 2 years prior to seeking medical attention.  The lesion was mostly exophytic and about 4 cm in greatest dimension.  The lesion was initially felt to be confined to the perianal skin. 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  Associated heavy inflammatory infiltrate present at the base of the tumor.  Tumor does not extend to inked deep surgical margin.  Patient was seen by radiation oncology, clinical staging was T2N0 M0 and received adjuvant radiation from 7/272017-11/11/2015.  11/22/2015, PET scan showed hypermetabolic activity in the anus consistent with patient's history of anal cancer with metastatic and right iliac chain/inguinal adenopathy.  02/05/2016.  Patient finished an additional radiation treatments with Dr. Aline Brochure with attention on just to the right inguinal nodes as well as the primary lesion.  05/30/2016, follow-up PET scan showed resolution of previously noted hypermetabolic inguinal lymph node adenopathy.  Physiological bowel uptake making it difficult to tell whether the residual uptake within the rectum is  physiological or residual tumor.  No waning uptake is identified.  Continued follow-up is recommended.  03/17/2018, bone scan showed focal uptake identified involving the right first rib anteriorly at approximately the fifth rib. Patient's pain is primarily in her spine.  No point tenderness over her ribs. 03/24/2018, MRI cervical thoracic lumbar spine showed no metastatic lesion.  Patient has spondylosis.  Patient reports that she had some image scan done in 2020-not available in current medical records that we obtained..  No images done in 2021.  She recalls that she got chemotherapy.-No records available at this point.  01/07/2021, MRI abdomen pelvis with and without contrast showed no evidence of local anal cancer recurrence.  No metastatic adenopathy in the pelvis or periaortic retroperitoneum.  Left lower quadrant colostomy without complication.  No hepatic metastasis.  03/13/2021, patient establish care with gastroenterology.  Colonoscopy was recommended for work-up of iron deficiency anemia.  Patient declined. Patient cannot tolerate oral iron supplementation and stopped it.  Previously tolerated Venofer treatments.  INTERVAL HISTORY Sarah Potts is a 88 y.o. female who has above history reviewed by me today presents for follow up visit for anal cancer, lung nodule, vitamin B12 deficiency. Patient has been on monthly vitamin B12 injections.  Patient denies any rectal bleeding, black stool. Her appetite is good.  Weight is stable.     Review of Systems  Constitutional:  Negative for appetite change, chills, fatigue and fever.  HENT:   Negative for hearing loss and voice change.   Eyes:  Negative for eye problems.  Respiratory:  Negative for chest tightness and cough.   Cardiovascular:  Negative for chest pain.  Gastrointestinal:  Negative for abdominal distention, abdominal pain and  blood in stool.  Endocrine: Negative for hot flashes.  Genitourinary:  Negative for difficulty  urinating and frequency.   Musculoskeletal:  Negative for arthralgias.  Skin:  Negative for itching and rash.  Neurological:  Negative for extremity weakness.  Hematological:  Negative for adenopathy.  Psychiatric/Behavioral:  Negative for confusion.     MEDICAL HISTORY:  Past Medical History:  Diagnosis Date   Anal cancer ([place])    Diabetes mellitus    Hyperlipidemia    Hypertension     SURGICAL HISTORY: Past Surgical History:  Procedure Laterality Date   BLADDER SURGERY     CARDIAC CATHETERIZATION     COLOSTOMY     CORONARY ANGIOPLASTY WITH STENT PLACEMENT  03/25/2018   Stent to the LCX  2.50 mm x 41m  [employer] [id number]  heart stent     TONSILLECTOMY     uterus surgery     removed    SOCIAL HISTORY: Social History   Socioeconomic History   Marital status: Single    Spouse name: Not on file   Number of children: Not on file   Years of education: Not on file   Highest education level: Not on file  Occupational History   Not on file  Tobacco Use   Smoking status: Never   Smokeless tobacco: Never  Substance and Sexual Activity   Alcohol use: No   Drug use: No   Sexual activity: Not on file  Other Topics Concern   Not on file  Social History Narrative   Not on file   Social Determinants of Health   Financial Resource Strain: Not on file  Food Insecurity: Not on file  Transportation Needs: Not on file  Physical Activity: Not on file  Stress: Not on file  Social Connections: Not on file  Intimate Partner Violence: Not on file    FAMILY HISTORY: Family History  Problem Relation Age of Onset   Cancer Mother    Breast cancer Mother    Heart disease Father    Heart attack Father    Cancer Sister    Heart disease Sister    Cervical cancer Sister    Heart disease Brother     ALLERGIES:  is allergic to levofloxacin, niacin, and niacin and related.  MEDICATIONS:  Current Outpatient Medications  Medication  Sig Dispense Refill   amLODipine (NORVASC) 5 MG tablet Take 1 tablet (5 mg total) by mouth in the morning. 90 tablet 3   atorvastatin (LIPITOR) 20 MG tablet Take 1 tablet (20 mg total) by mouth in the morning. For cholesterol 90 tablet 3   Cholecalciferol 25 MCG (1000 UT) tablet Take by mouth. Pt takes 3 daily     ezetimibe (ZETIA) 10 MG tablet Take 1 tablet (10 mg total) by mouth in the morning. For cholesterol 90 tablet 3   Iron-Vitamin C 65-125 MG TABS Take 1 tablet by mouth daily. 30 tablet 3   losartan (COZAAR) 100 MG tablet Take 1 tablet (100 mg total) by mouth in the morning. 90 tablet 3   magnesium oxide (MAG-OX) 400 MG tablet Take by mouth.     metFORMIN (GLUMETZA) 1000 MG (MOD) 24 hr tablet Take 1,000 mg by mouth 2 (two) times daily with a meal.       mirabegron ER (MYRBETRIQ) 50 MG TB24 tablet Take by mouth.     montelukast (SINGULAIR) 10 MG tablet Take 10 mg by mouth at bedtime.  nitroGLYCERIN (NITRODUR - DOSED IN MG/24 HR) 0.2 mg/hr patch PLEASE SEE ATTACHED FOR DETAILED DIRECTIONS     omega-3 acid ethyl esters (LOVAZA) 1 g capsule Take 2 g by mouth 2 (two) times daily.       omeprazole (PRILOSEC) 40 MG capsule Take 40 mg by mouth daily.       pregabalin (LYRICA) 75 MG capsule Take 75 mg by mouth 2 (two) times daily.     Rivaroxaban (XARELTO) 15 MG TABS tablet Take 1 tablet (15 mg total) by mouth daily. 90 tablet 3   sertraline (ZOLOFT) 50 MG tablet Take 1 tablet by mouth daily. Pt taking 1/2 daily     traZODone (DESYREL) 50 MG tablet Take 1 tablet by mouth at bedtime.     TRULICITY 1.5 IR/6.7EL SOPN Inject into the skin once a week.     ALPRAZolam (XANAX) 0.5 MG tablet Take 1 tablet (0.5 mg total) by mouth See admin instructions. Take 1 tablet prior to MRI appointment. You may repeat another dose. thanks (Patient not taking: Reported on 10/11/2021) 2 tablet 0   metoprolol succinate (TOPROL-XL) 50 MG 24 hr tablet Take 1 tablet (50 mg total) by mouth daily. 90 tablet 3   No  current facility-administered medications for this visit.     PHYSICAL EXAMINATION: ECOG PERFORMANCE STATUS: 1 - Symptomatic but completely ambulatory Vitals:   10/11/21 1118  BP: (!) 144/79  Pulse: 76  Resp: 18  Temp: 97.6 F (36.4 C)  SpO2: 99%   There were no vitals filed for this visit.   Physical Exam Constitutional:      General: She is not in acute distress. HENT:     Head: Normocephalic and atraumatic.  Eyes:     General: No scleral icterus. Cardiovascular:     Rate and Rhythm: Normal rate and regular rhythm.     Heart sounds: Normal heart sounds.  Pulmonary:     Effort: Pulmonary effort is normal. No respiratory distress.     Breath sounds: No wheezing.  Abdominal:     General: Bowel sounds are normal. There is no distension.     Palpations: Abdomen is soft.     Comments: Colostomy  Musculoskeletal:        General: No deformity. Normal range of motion.     Cervical back: Normal range of motion and neck supple.  Skin:    General: Skin is warm and dry.     Findings: No erythema or rash.  Neurological:     Mental Status: She is alert and oriented to person, place, and time. Mental status is at baseline.     Cranial Nerves: No cranial nerve deficit.     Coordination: Coordination normal.  Psychiatric:        Mood and Affect: Mood normal.     LABORATORY DATA:  I have reviewed the data as listed Lab Results  Component Value Date   WBC 4.0 10/09/2021   HGB 10.9 (L) 10/09/2021   HCT 35.3 (L) 10/09/2021   MCV 94.1 10/09/2021   PLT 209 10/09/2021   Recent Labs    12/20/20 1342 10/04/21 1128 10/09/21 1018  NA 133* 137 139  K 4.5 4.9 4.5  CL 102 104 109  CO2 '23 25 25  '$ GLUCOSE 135* 136* 164*  BUN '17 23 14  '$ CREATININE 1.14* 1.23* 1.22*  CALCIUM 8.7* 9.3 8.1*  GFRNONAA 47* 43* 43*  PROT 6.6 7.8 6.7  ALBUMIN 3.6 4.0 3.6  AST 19 25 23  ALT '13 18 17  '$ ALKPHOS 42 50 44  BILITOT 0.7 0.9 0.8    Iron/TIBC/Ferritin/ %Sat    Component Value  Date/Time   IRON 77 10/09/2021 1018   TIBC 350 10/09/2021 1018   FERRITIN 7 (L) 10/09/2021 1018   IRONPCTSAT 22 10/09/2021 1018      RADIOGRAPHIC STUDIES: I have personally reviewed the radiological images as listed and agreed with the findings in the report. CT CHEST WO CONTRAST  Result Date: 10/05/2021 CLINICAL DATA:  Follow-up right lung nodule, history of anal cancer * Tracking Code: BO * EXAM: CT CHEST WITHOUT CONTRAST TECHNIQUE: Multidetector CT imaging of the chest was performed following the standard protocol without IV contrast. RADIATION DOSE REDUCTION: This exam was performed according to the departmental dose-optimization program which includes automated exposure control, adjustment of the mA and/or kV according to patient size and/or use of iterative reconstruction technique. COMPARISON:  04/11/2021, 01/08/1999 FINDINGS: Cardiovascular: Aortic atherosclerosis. Normal heart size. Three-vessel coronary artery calcifications. No pericardial effusion. Mediastinum/Nodes: No enlarged mediastinal, hilar, or axillary lymph nodes. Thyroid gland, trachea, and esophagus demonstrate no significant findings. Lungs/Pleura: Unchanged small nodules of the bilateral lower lobes, including a 0.6 cm nodule of the superior segment right lower lobe (series 3, image 63), a 0.4 cm nodule of the anterior right lower lobe (series 3, image 58), and a 0.4 cm nodule of the left lower lobe (series 3, image 104). Mild dependent bibasilar scarring. No pleural effusion or pneumothorax. Upper Abdomen: No acute abnormality. Musculoskeletal: No chest wall abnormality. No suspicious osseous lesions identified. IMPRESSION: 1. Unchanged small nodules of the bilateral lower lobes, measuring 0.6 cm and smaller. No new nodules. Given established stability, these are most likely benign and incidental. Additional follow-up as indicated by clinical oncology protocol for history of anal cancer. 2. Coronary artery disease. Aortic  Atherosclerosis (ICD10-I70.0). Electronically Signed   By: Delanna Ahmadi M.D.   On: 10/05/2021 13:08      ASSESSMENT & PLAN:  1. B12 deficiency   2. Anemia in stage 3a chronic kidney disease (HCC)   3. Lung nodule   4. History of anal cancer   5. Other iron deficiency anemia    #History of anal cancer in 2017, she had a concurrent chemoradiation. Patient is now 6 years after initial diagnosis and treatments. Imaging will be obtained if clinically indicated.  #Iron deficiency anemia, Labs reviewed and discussed with patient. Recommend IV Venofer weekly x4. Recommend patient to reestablish care with gastroenterology and further discuss repeat colonoscopy.  #Vitamin B12 deficiency, intrinsic factor antibody and antiparietal antibody were both negative.  Continue monthly vitamin B12 injections.  #Lung nodule, 10/05/2021, CT chest showed unchanged small nodules of bilateral lower lobes.  No new nodules.  Given the stability, most likely benign incidental.  Orders Placed This Encounter  Procedures   CBC with Differential/Platelet    Standing Status:   Future    Standing Expiration Date:   10/12/2022   Ferritin    Standing Status:   Future    Standing Expiration Date:   10/12/2022   Vitamin B12    Standing Status:   Future    Standing Expiration Date:   10/12/2022   Iron and TIBC    Standing Status:   Future    Standing Expiration Date:   10/12/2022    All questions were answered. The patient knows to call the clinic with any problems questions or concerns.  cc Maryland Pink, MD   Follow up in 4 months. Lab  MD +/- Venofer.  Continue monthly vitamin B12 injections.   [name], MD, PhD  10/12/2021

## 2021-10-14 ENCOUNTER — Encounter: Payer: Self-pay | Admitting: Oncology

## 2021-10-14 NOTE — Addendum Note (Signed)
Addended by: [name] on: 10/14/2021 08:42 AM   Modules accepted: Orders

## 2021-10-17 ENCOUNTER — Ambulatory Visit: Payer: PPO | Attending: Family Medicine

## 2021-10-17 DIAGNOSIS — R2681 Unsteadiness on feet: Secondary | ICD-10-CM | POA: Insufficient documentation

## 2021-10-17 DIAGNOSIS — R262 Difficulty in walking, not elsewhere classified: Secondary | ICD-10-CM | POA: Insufficient documentation

## 2021-10-17 DIAGNOSIS — M6281 Muscle weakness (generalized): Secondary | ICD-10-CM | POA: Insufficient documentation

## 2021-10-18 ENCOUNTER — Inpatient Hospital Stay: Payer: PPO | Attending: Oncology

## 2021-10-18 VITALS — BP 133/62 | HR 74 | Temp 97.2°F | Resp 18

## 2021-10-18 DIAGNOSIS — N1831 Chronic kidney disease, stage 3a: Secondary | ICD-10-CM | POA: Diagnosis not present

## 2021-10-18 DIAGNOSIS — E538 Deficiency of other specified B group vitamins: Secondary | ICD-10-CM | POA: Diagnosis not present

## 2021-10-18 DIAGNOSIS — D509 Iron deficiency anemia, unspecified: Secondary | ICD-10-CM | POA: Diagnosis not present

## 2021-10-18 MED ORDER — SODIUM CHLORIDE 0.9 % IV SOLN
200.0000 mg | Freq: Once | INTRAVENOUS | Status: AC
  Administered 2021-10-18: 200 mg via INTRAVENOUS
  Filled 2021-10-18: qty 10

## 2021-10-18 MED ORDER — SODIUM CHLORIDE 0.9 % IV SOLN
Freq: Once | INTRAVENOUS | Status: AC
  Filled 2021-10-18: qty 250

## 2021-10-18 NOTE — Patient Instructions (Signed)
[facility]  Discharge Instructions: Thank you for choosing [facility] to provide your oncology and hematology care.  If you have a lab appointment with the Cancer Center, please go directly to the Cancer Center and check in at the registration area.  Wear comfortable clothing and clothing appropriate for easy access to any Portacath or PICC line.   We strive to give you quality time with your provider. You may need to reschedule your appointment if you arrive late (15 or more minutes).  Arriving late affects you and other patients whose appointments are after yours.  Also, if you miss three or more appointments without notifying the office, you may be dismissed from the clinic at the provider's discretion.      For prescription refill requests, have your pharmacy contact our office and allow 72 hours for refills to be completed.    Today you received the following chemotherapy and/or immunotherapy agents VENOFER      To help prevent nausea and vomiting after your treatment, we encourage you to take your nausea medication as directed.  BELOW ARE SYMPTOMS THAT SHOULD BE REPORTED IMMEDIATELY: *FEVER GREATER THAN 100.4 F (38 C) OR HIGHER *CHILLS OR SWEATING *NAUSEA AND VOMITING THAT IS NOT CONTROLLED WITH YOUR NAUSEA MEDICATION *UNUSUAL SHORTNESS OF BREATH *UNUSUAL BRUISING OR BLEEDING *URINARY PROBLEMS (pain or burning when urinating, or frequent urination) *BOWEL PROBLEMS (unusual diarrhea, constipation, pain near the anus) TENDERNESS IN MOUTH AND THROAT WITH OR WITHOUT PRESENCE OF ULCERS (sore throat, sores in mouth, or a toothache) UNUSUAL RASH, SWELLING OR PAIN  UNUSUAL VAGINAL DISCHARGE OR ITCHING   Items with * indicate a potential emergency and should be followed up as soon as possible or go to the Emergency Department if any problems should occur.  Please show the CHEMOTHERAPY ALERT CARD or IMMUNOTHERAPY ALERT CARD at check-in to the  Emergency Department and triage nurse.  Should you have questions after your visit or need to cancel or reschedule your appointment, please contact [facility]  [phone] and follow the prompts.  Office hours are 8:00 a.m. to 4:30 p.m. Monday - Friday. Please note that voicemails left after 4:00 p.m. may not be returned until the following business day.  We are closed weekends and major holidays. You have access to a nurse at all times for urgent questions. Please call the main number to the clinic [phone] and follow the prompts.  For any non-urgent questions, you may also contact your provider using MyChart. We now offer e-Visits for anyone 18 and older to request care online for non-urgent symptoms. For details visit [email or web address].   Also download the MyChart app! Go to the app store, search "MyChart", open the app, select [facility], and log in with your MyChart username and password.  Masks are optional in the cancer centers. If you would like for your care team to wear a mask while they are taking care of you, please let them know. For doctor visits, patients may have with them one support person who is at least 86 years old. At this time, visitors are not allowed in the infusion area.   Iron Sucrose Injection What is this medication? IRON SUCROSE (EYE ern SOO krose) treats low levels of iron (iron deficiency anemia) in people with kidney disease. Iron is a mineral that plays an important role in making red blood cells, which carry oxygen from your lungs to the rest of your body. This medicine may   be used for other purposes; ask your health care provider or pharmacist if you have questions. COMMON BRAND NAME(S): Venofer What should I tell my care team before I take this medication? They need to know if you have any of these conditions: Anemia not caused by low iron levels Heart disease High levels of iron in the blood Kidney disease Liver  disease An unusual or allergic reaction to iron, other medications, foods, dyes, or preservatives Pregnant or trying to get pregnant Breast-feeding How should I use this medication? This medication is for infusion into a vein. It is given in a hospital or clinic setting. Talk to your care team about the use of this medication in children. While this medication may be prescribed for children as young as 2 years for selected conditions, precautions do apply. Overdosage: If you think you have taken too much of this medicine contact a poison control center or emergency room at once. NOTE: This medicine is only for you. Do not share this medicine with others. What if I miss a dose? It is important not to miss your dose. Call your care team if you are unable to keep an appointment. What may interact with this medication? Do not take this medication with any of the following: Deferoxamine Dimercaprol Other iron products This medication may also interact with the following: Chloramphenicol Deferasirox This list may not describe all possible interactions. Give your health care provider a list of all the medicines, herbs, non-prescription drugs, or dietary supplements you use. Also tell them if you smoke, drink alcohol, or use illegal drugs. Some items may interact with your medicine. What should I watch for while using this medication? Visit your care team regularly. Tell your care team if your symptoms do not start to get better or if they get worse. You may need blood work done while you are taking this medication. You may need to follow a special diet. Talk to your care team. Foods that contain iron include: whole grains/cereals, dried fruits, beans, or peas, leafy green vegetables, and organ meats (liver, kidney). What side effects may I notice from receiving this medication? Side effects that you should report to your care team as soon as possible: Allergic reactions--skin rash, itching, hives,  swelling of the face, lips, tongue, or throat Low blood pressure--dizziness, feeling faint or lightheaded, blurry vision Shortness of breath Side effects that usually do not require medical attention (report to your care team if they continue or are bothersome): Flushing Headache Joint pain Muscle pain Nausea Pain, redness, or irritation at injection site This list may not describe all possible side effects. Call your doctor for medical advice about side effects. You may report side effects to FDA at 1-800-FDA-1088. Where should I keep my medication? This medication is given in a hospital or clinic and will not be stored at home. NOTE: This sheet is a summary. It may not cover all possible information. If you have questions about this medicine, talk to your doctor, pharmacist, or health care provider.  2023 Elsevier/Gold Standard (2020-08-24 00:00:00)   

## 2021-10-25 ENCOUNTER — Ambulatory Visit: Payer: PPO

## 2021-10-25 ENCOUNTER — Inpatient Hospital Stay: Payer: PPO

## 2021-10-25 VITALS — BP 112/73 | HR 73 | Temp 97.2°F

## 2021-10-25 DIAGNOSIS — R2681 Unsteadiness on feet: Secondary | ICD-10-CM

## 2021-10-25 DIAGNOSIS — D509 Iron deficiency anemia, unspecified: Secondary | ICD-10-CM | POA: Diagnosis not present

## 2021-10-25 DIAGNOSIS — R262 Difficulty in walking, not elsewhere classified: Secondary | ICD-10-CM | POA: Diagnosis not present

## 2021-10-25 DIAGNOSIS — M6281 Muscle weakness (generalized): Secondary | ICD-10-CM

## 2021-10-25 DIAGNOSIS — E538 Deficiency of other specified B group vitamins: Secondary | ICD-10-CM

## 2021-10-25 MED ORDER — SODIUM CHLORIDE 0.9 % IV SOLN
Freq: Once | INTRAVENOUS | Status: AC
  Filled 2021-10-25: qty 250

## 2021-10-25 MED ORDER — SODIUM CHLORIDE 0.9 % IV SOLN
200.0000 mg | Freq: Once | INTRAVENOUS | Status: AC
  Administered 2021-10-25: 200 mg via INTRAVENOUS
  Filled 2021-10-25: qty 10

## 2021-10-25 NOTE — Therapy (Signed)
OUTPATIENT PHYSICAL THERAPY TREATMENT NOTE   Patient Name: Sarah Potts MRN: [id number] DOB:1932/09/03, 88 y.o., female Today's Date: 10/25/2021  PCP: Maryland Pink, MD and Minna Merritts, MD (both listed per chart) REFERRING PROVIDER: Maryland Pink, MD    PT End of Session - 10/25/21 1127     Visit Number 2    Number of Visits 17    Date for PT Re-Evaluation 11/15/21    PT Start Time 1017    PT Stop Time 1101    PT Time Calculation (min) 44 min    Equipment Utilized During Treatment Gait belt    Activity Tolerance Patient tolerated treatment well    Behavior During Therapy WFL for tasks assessed/performed             Past Medical History:  Diagnosis Date   Anal cancer ([place])    Diabetes mellitus    Hyperlipidemia    Hypertension    Past Surgical History:  Procedure Laterality Date   BLADDER SURGERY     CARDIAC CATHETERIZATION     COLOSTOMY     CORONARY ANGIOPLASTY WITH STENT PLACEMENT  03/25/2018   Stent to the LCX  2.50 mm x 92m  [employer] [id number]  heart stent     TONSILLECTOMY     uterus surgery     removed   Patient Active Problem List   Diagnosis Date Noted   Absolute anemia 01/09/2021   Lung nodule 01/09/2021   Anemia in stage 3a chronic kidney disease ([place] 01/09/2021   B12 deficiency 01/09/2021   Stage 3a chronic kidney disease ([facility] 01/09/2021   Goals of care, counseling/discussion 12/18/2020   History of anal cancer 12/11/2020   Bronchitis 12/15/2012   CAD (coronary artery disease) 11/13/2010   HTN (hypertension) 11/13/2010   Diabetes mellitus ([facility] 11/13/2010   Hyperlipidemia 11/13/2010   Obesity 11/13/2010    REFERRING DIAG: Dizziness and giddiness  THERAPY DIAG:  Unsteadiness on feet  Muscle weakness (generalized)  Rationale for Evaluation and Treatment Rehabilitation  PERTINENT HISTORY: Pt is a pleasant 88y/o female presenting to PT for dizziness. She is ambulating without an AD. Pt  reports hx of BPPV. She had new onset of dizziness that started in March and states she was "dizzy all the time." Pt was prescribed meclizine and reports significant improvement in dizziness since taking medication. She now only feels brief dizziness when rolling in her bed or bending to pick something up. She describes her symptoms as feeling as if she is spinning and that it lasts for seconds. She had vomited in the past but reports no nausea or vomiting recently. Pt denies any falls in the past six months. Pt is not entirely sure she "needs physical therapy" but wanted to follow up regarding lingering symptoms. Pt denies any spontaneous symptoms. Pt has difficulty hearing and has an upcoming appointment to get hearing aids. Pt has been seen by a neurologist and reports nothing was found to be "wrong with my mind."  Pt wears glasses. She has chronic n/t in hands and feet due to neuropathy. Pt with chronic neck pain with movement. PMH significant for abdominal hernia, arthritis, asthma, breast tumor (benign), cerebrovascular disease, coronary artery disease, depression, diabetes, dyslipidemia, gastritis, CA, cataract, myocardial infarction, HTN, sleep apnea, idiopathic peripheral neuropathy, low back pain radiating to L leg, L cervical radiculopathy, B12 deficiency    PRECAUTIONS: Fall, pt has ostomy bag  SUBJECTIVE: Pt states she has been "not too bad." Pt  reports she's had some dizziness recently. Pt reports no spinning but some lightheadedness. Pt feels a light-headed feeling when she moves in bed, gets up. It doesn't last very long. Doesn't occur with walking. Pt reports no falls. She feels very unstable on her feet, however. Balance and lightheaded feeling is main concern. Recently reviewed her med list with nurse.   PAIN:  Are you having pain? Yes: NPRS scale: unrated/10 Pain location: low back Pain description: ache, chronic issue Aggravating factors:   Relieving factors:       TODAY'S  TREATMENT:  10/25/2021: Gait belt donned and placed above ostomy bag for session, CGA provided unless otherwise noted  Therex: MMT- UE: grossly 4+/5, pain with bilat shoulder flexion LE: grossly 4/5, pain with B hip flexion  Orthostatics-  Supine: 140/51 mmHg, 73 bpm  Seated: 137/62 mmHg, 76 bpm; Pt reports feel "little" lightheaded, lasts for a few seconds.  Standing: 140/60 mmHg, 78 bpm; Pt denies lightheadedness.    PT recommends pt review med list with her PCP and/or cardiologist on future date. Pt verbalizes understanding.     NMR: Coordination UE and LE: WNL VORx1 gaze stabilization: moderate instability with horizontal & vertical head turns. Pt denies dizziness and lightheadedness, however, reports hearing a "hum" with head movements. States goes away with cessation of head turns. Reports this is not new.   Following VOR PT had pt perform seated cervical extension with rotation bilat and denies hum sound or any dizziness/lightheadedness/symptoms with this sustained movement.  CTSIB-M: Standing on firm surface, EO: 30 sec  Standing on firm surface, EC: 30 sec, increased sway noted Standing on airex, EO: 30 sec Standing on airex, EC: < 10 sec, sig instability noted, PT provides min a to prevent LOB.  On airex: - WBOS, EC: 3 x 30 sec, pt rates as "closer to hard," does improve stability with reps  Static surface SLB: - RLE: able to sustain approx 6-10 sec before toe-touch or UE support; practices for 30 sec bout - LLE: able to sustain approx 6-10 sec before toe-touch or UE support; practices for 30 sec bout  Therex- Seated marching: 2 x 20, rates as easy  Progressed with RTB, 20x (provided blue theraband for home use) Standing hip abduction: 15x each LE, cues for speed of movement and eccentric control, pt reports pain in low back, intervention discontinued.  Seated hip abduction: RTB, 15x   Other comments: all therabands used and provided are latex free due to allergy    PATIENT EDUCATION: Education details: Pt educated throughout session about proper posture and technique with exercises. Improved exercise technique, movement at target joints, use of target muscles after min to mod verbal, visual, tactile cues. Additionally, educated pt on further assessment findings, recommendations, and HEP.   Person educated: Patient Education method: Explanation, Demonstration, Tactile cues, Verbal cues, and Handouts Education comprehension: verbalized understanding, returned demonstration, and needs further education   HOME EXERCISE PROGRAM: Access Code: [email or web address] URL: [email or web address] Date: 10/25/2021 Prepared by: Ricard Dillon  Exercises - Single Leg Balance with Knee Flexion with PLB  - 1 x daily - 7 x weekly - 2 sets - 1 reps - 30 hold; instruction to perform at counter top (support surface) with sturdy chair behind her  - Seated March with Resistance  - 1 x daily - 5 x weekly - 3 sets - 15 reps - Seated Hip Abduction with Resistance  - 1 x daily - 5 x weekly - 3 sets - 15 reps   PT  Short Term Goals -      PT SHORT TERM GOAL #1   Title Pt will be independent with HEP in order to improve strength and balance in order to decrease fall risk and improve function at home and work.    Baseline 6/9: deferred to next session if appropriate    Time 4    Period Weeks    Status New    Target Date 10/18/21              PT Long Term Goals -      PT LONG TERM GOAL #1   Title Pt will improve DGI by at least 3 points in order to demonstrate clinically significant improvement in balance and decreased risk for falls.    Baseline 6/9: 20/24    Time 8    Period Weeks    Status New    Target Date 11/15/21      PT LONG TERM GOAL #2   Title Pt will report no dizziness within the past week when rolling over in her bed or when bending to pick something up to improve QOL and ease/safety with ADLs.    Baseline 6/9: Pt currently has dizziness with  these activities    Time 8    Period Weeks    Status New    Target Date 11/15/21      PT LONG TERM GOAL #3   Title Pt will decrease DHI score by at least 18 points in order to demonstrate clinically significant reduction in disability    Baseline 6/9: 34/100    Time 8    Period Weeks    Status New    Target Date 11/15/21              Plan -     Clinical Impression Statement Pt returns to PT following absense (reported difficulty with copay). She reports no further dizziness since last appointment, but occassional motion-provoked lightheadedness. Further assessment completed. Orthostatics unremarkable, pt with brief lightheaded feeling when going from supine>seated (140/51>137/62 mmHg). Pt MMT indicates UE and LE impairment. Pt unable to complete fourth condition of CTSIB-M, indicating imparied use of vestibular cues to maintain balance. Pt also noted to have postural instability with head turns/VOR exercise. Pt only able to maintain SLB for 6-10 sec. PT issued HEP to address these deficits. The pt will benefit from further skilled PT to improve strength and balance in order to decrease fall risk and improve QOL.    Personal Factors and Comorbidities Age;Past/Current Experience;Sex;Time since onset of injury/illness/exacerbation;Comorbidity 3+    Comorbidities PMH significant for abdominal hernia, arthritis, asthma, breast tumor (benign), cerebrovascular disease, coronary artery disease, depression, diabetes, dyslipidemia, gastritis, CA, cataract, myocardial infarction, HTN, sleep apnea, idiopathic peripheral neuropathy, low back pain radiating to L leg, L cervical radiculopathy, B12 deficiency    Examination-Activity Limitations Bed Mobility;Bend;Locomotion Level;Stairs    Examination-Participation Restrictions Other;Laundry;Yard Work    [employer]chant [employer] officer Evolving/Moderate complexity    Rehab Potential Good    PT Frequency 2x / week    PT Duration 8 weeks    PT  Treatment/Interventions ADLs/Self Care Home Management;Canalith Repostioning;Cryotherapy;Moist Heat;Electrical Stimulation;Traction;DME Instruction;Gait training;Stair training;Functional mobility training;Therapeutic activities;Therapeutic exercise;Balance training;Neuromuscular re-education;Patient/family education;Orthotic Fit/Training;Manual techniques;Passive range of motion;Dry needling;Energy conservation;Taping;Vestibular;Visual/perceptual remediation/compensation;Joint Manipulations    PT Next Visit Plan repeat maneuver as indicated/appropriate    PT Home Exercise Plan provide next 1-2 visits if indicated    Consulted and Agree with Plan of Care Patient  Zollie Pee, PT 10/25/2021, 11:28 AM

## 2021-10-28 ENCOUNTER — Ambulatory Visit: Payer: PPO

## 2021-10-28 DIAGNOSIS — R2681 Unsteadiness on feet: Secondary | ICD-10-CM

## 2021-10-28 DIAGNOSIS — R262 Difficulty in walking, not elsewhere classified: Secondary | ICD-10-CM

## 2021-10-28 DIAGNOSIS — M6281 Muscle weakness (generalized): Secondary | ICD-10-CM

## 2021-10-28 NOTE — Therapy (Signed)
OUTPATIENT PHYSICAL THERAPY TREATMENT NOTE   Patient Name: Sarah Potts MRN: [id number] DOB:18-Sep-1932, 88 y.o., female Today's Date: 10/28/2021  PCP: Maryland Pink, MD and Minna Merritts, MD (both listed per chart) REFERRING PROVIDER: Maryland Pink, MD    PT End of Session - 10/28/21 1522     Visit Number 3    Number of Visits 17    Date for PT Re-Evaluation 11/15/21    PT Start Time 6010    PT Stop Time 1600    PT Time Calculation (min) 44 min    Equipment Utilized During Treatment Gait belt    Activity Tolerance Patient tolerated treatment well    Behavior During Therapy WFL for tasks assessed/performed              Past Medical History:  Diagnosis Date   Anal cancer ([place])    Diabetes mellitus    Hyperlipidemia    Hypertension    Past Surgical History:  Procedure Laterality Date   BLADDER SURGERY     CARDIAC CATHETERIZATION     COLOSTOMY     CORONARY ANGIOPLASTY WITH STENT PLACEMENT  03/25/2018   Stent to the LCX  2.50 mm x 80m  [employer] [id number]  heart stent     TONSILLECTOMY     uterus surgery     removed   Patient Active Problem List   Diagnosis Date Noted   Absolute anemia 01/09/2021   Lung nodule 01/09/2021   Anemia in stage 3a chronic kidney disease ([place] 01/09/2021   B12 deficiency 01/09/2021   Stage 3a chronic kidney disease ([facility] 01/09/2021   Goals of care, counseling/discussion 12/18/2020   History of anal cancer 12/11/2020   Bronchitis 12/15/2012   CAD (coronary artery disease) 11/13/2010   HTN (hypertension) 11/13/2010   Diabetes mellitus ([facility] 11/13/2010   Hyperlipidemia 11/13/2010   Obesity 11/13/2010    REFERRING DIAG: Dizziness and giddiness  THERAPY DIAG:  Muscle weakness (generalized)  Unsteadiness on feet  Difficulty in walking, not elsewhere classified  Rationale for Evaluation and Treatment Rehabilitation  PERTINENT HISTORY: Pt is a pleasant 88y/o female presenting to  PT for dizziness. She is ambulating without an AD. Pt reports hx of BPPV. She had new onset of dizziness that started in March and states she was "dizzy all the time." Pt was prescribed meclizine and reports significant improvement in dizziness since taking medication. She now only feels brief dizziness when rolling in her bed or bending to pick something up. She describes her symptoms as feeling as if she is spinning and that it lasts for seconds. She had vomited in the past but reports no nausea or vomiting recently. Pt denies any falls in the past six months. Pt is not entirely sure she "needs physical therapy" but wanted to follow up regarding lingering symptoms. Pt denies any spontaneous symptoms. Pt has difficulty hearing and has an upcoming appointment to get hearing aids. Pt has been seen by a neurologist and reports nothing was found to be "wrong with my mind."  Pt wears glasses. She has chronic n/t in hands and feet due to neuropathy. Pt with chronic neck pain with movement. PMH significant for abdominal hernia, arthritis, asthma, breast tumor (benign), cerebrovascular disease, coronary artery disease, depression, diabetes, dyslipidemia, gastritis, CA, cataract, myocardial infarction, HTN, sleep apnea, idiopathic peripheral neuropathy, low back pain radiating to L leg, L cervical radiculopathy, B12 deficiency    PRECAUTIONS: Fall, pt has ostomy bag  SUBJECTIVE: Pt  felt "a little off" yesterday, but feels OK now. She slept most of the day yesterday. Felt tired. Pt walked six blocks today because her car wouldn't start. She is proud of herself. Feels she will start walking more. She continues to report no dizziness. Pt reports doing her HEP. Greatest difficulty with SLB. Pt reports her back felt good after last appointment.  PAIN:  Are you having pain? Yes: NPRS scale: not rated/10 Pain location: low back Pain description: ache, chronic issue Aggravating factors:   Relieving factors:        TODAY'S TREATMENT:   10/28/2021: Gait belt donned and placed above ostomy bag for session, CGA provided unless otherwise noted    Therex:  3# weights donned each LE: -LAQ  2x12x 1-2 sec holds each LE. Rates medium  -Seated March 2x12 alt LE. Cuing for upright posture, decreased speed of movement. Rates medium. Second set performed with 1-2 sec pause at top of ROM.  Seated side stepping over orange hurdle 10x RLE. Rates easy  Progressed to 4# weights donned 10x each LE  PT instructed pt in progressive walking program. Discussed using her walker during when ambulating outside due to current unsteadiness and risk of increased unsteadiness with fatigue. Pt agreeable and verbalized understanding.    NMR:  Standing on Airex: EO WBOS 1x 30 sec  EO NBOS 2x 30 sec. Reports fatiguing to ankle musculature  Horizontal head turns, NBOS 15x. Reports feels OK.  Vertical head turns, NBOS 10x Comments: Intermittent UE support throughout  One foot on firm surface (floor) and one on 6" step, static standing in this position 30 sec each LE  One foot on airex, one on 6" step, static standing in this position 15 sec each LE  Progressed to performing on each LE with horizontal head turns x multiple reps. Pt requires intermittent UE support on bar.  Firm surface, NBOS, EC 3x30 sec. Rates medium. Requires intermittent UE support  Dynamic, stepping FWD/BCKWD over orange hurdle to promote safe foot clearance and balance x multiple reps. Multiple instances of LOB with retro-stepping, requiring UE support on bar to correct. SPT cued pt to use UE support with retro-step due to difficulty.   LTL stepping over orange hurdle for foot clearance and dynamic balance x multiple reps each direction. SPT cues pt to increase step-length. Pt fatigues with intervention.    Pt seated on dynadisc, balloon toss with SPT  x 5 minutes    PATIENT EDUCATION: Education details: Pt educated throughout session about  proper posture and technique with exercises. Improved exercise technique, movement at target joints, use of target muscles after min to mod verbal, visual, tactile cues. Progressive walking program Person educated: Patient Education method: Explanation, Demonstration, Tactile cues, and Verbal cues Education comprehension: verbalized understanding, returned demonstration, and needs further education   HOME EXERCISE PROGRAM:   No updates 10/28/2021   Access Code: [email or web address] URL: [email or web address] Date: 10/25/2021 Prepared by: Ricard Dillon  Exercises - Single Leg Balance with Knee Flexion with PLB  - 1 x daily - 7 x weekly - 2 sets - 1 reps - 30 hold; instruction to perform at counter top (support surface) with sturdy chair behind her  - Seated March with Resistance  - 1 x daily - 5 x weekly - 3 sets - 15 reps - Seated Hip Abduction with Resistance  - 1 x daily - 5 x weekly - 3 sets - 15 reps   PT Short Term Goals -  PT SHORT TERM GOAL #1   Title Pt will be independent with HEP in order to improve strength and balance in order to decrease fall risk and improve function at home and work.    Baseline 6/9: deferred to next session if appropriate    Time 4    Period Weeks    Status New    Target Date 10/18/21              PT Long Term Goals -      PT LONG TERM GOAL #1   Title Pt will improve DGI by at least 3 points in order to demonstrate clinically significant improvement in balance and decreased risk for falls.    Baseline 6/9: 20/24    Time 8    Period Weeks    Status New    Target Date 11/15/21      PT LONG TERM GOAL #2   Title Pt will report no dizziness within the past week when rolling over in her bed or when bending to pick something up to improve QOL and ease/safety with ADLs.    Baseline 6/9: Pt currently has dizziness with these activities    Time 8    Period Weeks    Status New    Target Date 11/15/21      PT LONG TERM GOAL #3   Title  Pt will decrease DHI score by at least 18 points in order to demonstrate clinically significant reduction in disability    Baseline 6/9: 34/100    Time 8    Period Weeks    Status New    Target Date 11/15/21              Plan -     Clinical Impression Statement Pt able to advance to more challenging strengthening and balance interventions. Pt with noted fatigue in LE and ankle musculature throughout. She required intermittent UE support with majority of balance interventions. Greatest difficulty with SLB and NBOS tasks. The pt will benefit from further skilled PT to improve strength and balance in order to decrease fall risk and improve QOL.    Personal Factors and Comorbidities Age;Past/Current Experience;Sex;Time since onset of injury/illness/exacerbation;Comorbidity 3+    Comorbidities PMH significant for abdominal hernia, arthritis, asthma, breast tumor (benign), cerebrovascular disease, coronary artery disease, depression, diabetes, dyslipidemia, gastritis, CA, cataract, myocardial infarction, HTN, sleep apnea, idiopathic peripheral neuropathy, low back pain radiating to L leg, L cervical radiculopathy, B12 deficiency    Examination-Activity Limitations Bed Mobility;Bend;Locomotion Level;Stairs    Examination-Participation Restrictions Other;Laundry;Yard Work    [employer]chant [employer] officer Evolving/Moderate complexity    Rehab Potential Good    PT Frequency 2x / week    PT Duration 8 weeks    PT Treatment/Interventions ADLs/Self Care Home Management;Canalith Repostioning;Cryotherapy;Moist Heat;Electrical Stimulation;Traction;DME Instruction;Gait training;Stair training;Functional mobility training;Therapeutic activities;Therapeutic exercise;Balance training;Neuromuscular re-education;Patient/family education;Orthotic Fit/Training;Manual techniques;Passive range of motion;Dry needling;Energy conservation;Taping;Vestibular;Visual/perceptual remediation/compensation;Joint  Manipulations    PT Next Visit Plan repeat maneuver as indicated/appropriate    PT Home Exercise Plan provide next 1-2 visits if indicated    Consulted and Agree with Plan of Care Patient               Zollie Pee, PT 10/28/2021, 5:11 PM

## 2021-11-01 ENCOUNTER — Inpatient Hospital Stay: Payer: PPO

## 2021-11-01 ENCOUNTER — Telehealth: Payer: Self-pay | Admitting: Cardiovascular Disease

## 2021-11-01 ENCOUNTER — Ambulatory Visit: Payer: PPO

## 2021-11-01 VITALS — BP 128/71 | HR 76 | Temp 96.9°F | Resp 20

## 2021-11-01 DIAGNOSIS — E538 Deficiency of other specified B group vitamins: Secondary | ICD-10-CM

## 2021-11-01 DIAGNOSIS — K219 Gastro-esophageal reflux disease without esophagitis: Secondary | ICD-10-CM | POA: Diagnosis not present

## 2021-11-01 DIAGNOSIS — Z433 Encounter for attention to colostomy: Secondary | ICD-10-CM | POA: Diagnosis not present

## 2021-11-01 DIAGNOSIS — Z86010 Personal history of colonic polyps: Secondary | ICD-10-CM | POA: Diagnosis not present

## 2021-11-01 DIAGNOSIS — Z85048 Personal history of other malignant neoplasm of rectum, rectosigmoid junction, and anus: Secondary | ICD-10-CM | POA: Diagnosis not present

## 2021-11-01 DIAGNOSIS — D509 Iron deficiency anemia, unspecified: Secondary | ICD-10-CM | POA: Diagnosis not present

## 2021-11-01 MED ORDER — SODIUM CHLORIDE 0.9 % IV SOLN
Freq: Once | INTRAVENOUS | Status: AC
  Filled 2021-11-01: qty 250

## 2021-11-01 MED ORDER — SODIUM CHLORIDE 0.9 % IV SOLN
200.0000 mg | Freq: Once | INTRAVENOUS | Status: AC
  Administered 2021-11-01: 200 mg via INTRAVENOUS
  Filled 2021-11-01: qty 200

## 2021-11-01 NOTE — Telephone Encounter (Signed)
   Pre-operative Risk Assessment    Patient Name: Sarah Potts  DOB: June 01, 1932 MRN: [id number]      Request for Surgical Clearance    Procedure:   Colon/EGD  Date of Surgery:  Clearance 02/07/22                                 Surgeon:  Dr Lesly Rubenstein Surgeon's Group or Practice Name:  [facility] Phone number:  [phone] Fax number:  [phone]   Type of Clearance Requested:   - Pharmacy:  Hold Rivaroxaban (Xarelto) instructions   Type of Anesthesia:  Not Indicated   Additional requests/questions:    Manfred Arch   11/01/2021, 2:13 PM

## 2021-11-04 NOTE — Telephone Encounter (Signed)
   Patient Name: Sarah Potts  DOB: [birth year] MRN: [id number]  Primary Cardiologist: Ida Rogue, MD  Local pharmacist have reviewed the patient's past medical history, labs, and current medications as part of pre-operative protocol coverage.  The following recommendations have been made:  Patient with diagnosis of atrial fibrillation on Xarelto for anticoagulation.     Procedure: colon/EGD Date of procedure: 02/07/22     CHA2DS2-VASc Score = 6   This indicates a 9.7% annual risk of stroke. The patient's score is based upon: CHF History: 0 HTN History: 1 Diabetes History: 1 Stroke History: 0 Vascular Disease History: 1 Age Score: 2 Gender Score: 1   CrCl 41 Platelet count 209   Per office protocol, patient can hold Xarelto for 2 days prior to procedure.   Patient will not need bridging with Lovenox (enoxaparin) around procedure.   I will route this recommendation to the requesting party via Epic fax function and remove from pre-op pool.  Please call with questions.  Lenna Sciara, NP 11/04/2021, 4:29 PM

## 2021-11-04 NOTE — Telephone Encounter (Signed)
Patient with diagnosis of atrial fibrillation on Xarelto for anticoagulation.    Procedure: colon/EGD Date of procedure: 02/07/22   CHA2DS2-VASc Score = 6   This indicates a 9.7% annual risk of stroke. The patient's score is based upon: CHF History: 0 HTN History: 1 Diabetes History: 1 Stroke History: 0 Vascular Disease History: 1 Age Score: 2 Gender Score: 1   CrCl 41 Platelet count 209  Per office protocol, patient can hold Xarelto for 2 days prior to procedure.   Patient will not need bridging with Lovenox (enoxaparin) around procedure.  **This guidance is not considered finalized until pre-operative APP has relayed final recommendations.**

## 2021-11-06 ENCOUNTER — Ambulatory Visit: Payer: PPO

## 2021-11-07 MED FILL — Iron Sucrose Inj 20 MG/ML (Fe Equiv): INTRAVENOUS | Qty: 10 | Status: AC

## 2021-11-08 ENCOUNTER — Ambulatory Visit: Payer: PPO

## 2021-11-08 ENCOUNTER — Inpatient Hospital Stay: Payer: PPO

## 2021-11-08 VITALS — BP 101/58 | HR 71 | Temp 97.1°F | Resp 18

## 2021-11-08 DIAGNOSIS — E538 Deficiency of other specified B group vitamins: Secondary | ICD-10-CM

## 2021-11-08 DIAGNOSIS — D509 Iron deficiency anemia, unspecified: Secondary | ICD-10-CM | POA: Diagnosis not present

## 2021-11-08 MED ORDER — CYANOCOBALAMIN 1000 MCG/ML IJ SOLN
1000.0000 ug | Freq: Once | INTRAMUSCULAR | Status: AC
  Administered 2021-11-08: 1000 ug via INTRAMUSCULAR
  Filled 2021-11-08: qty 1

## 2021-11-08 MED ORDER — SODIUM CHLORIDE 0.9 % IV SOLN
200.0000 mg | Freq: Once | INTRAVENOUS | Status: AC
  Administered 2021-11-08: 200 mg via INTRAVENOUS
  Filled 2021-11-08: qty 200

## 2021-11-08 MED ORDER — SODIUM CHLORIDE 0.9 % IV SOLN
Freq: Once | INTRAVENOUS | Status: AC
  Filled 2021-11-08: qty 250

## 2021-11-11 ENCOUNTER — Ambulatory Visit: Payer: PPO | Admitting: Cardiovascular Disease

## 2021-11-12 ENCOUNTER — Ambulatory Visit: Payer: PPO | Attending: Family Medicine

## 2021-11-12 DIAGNOSIS — R42 Dizziness and giddiness: Secondary | ICD-10-CM | POA: Insufficient documentation

## 2021-11-12 DIAGNOSIS — R2681 Unsteadiness on feet: Secondary | ICD-10-CM | POA: Diagnosis not present

## 2021-11-12 DIAGNOSIS — M6281 Muscle weakness (generalized): Secondary | ICD-10-CM | POA: Insufficient documentation

## 2021-11-12 NOTE — Therapy (Signed)
OUTPATIENT PHYSICAL THERAPY TREATMENT NOTE   Patient Name: Sarah Potts MRN: [id number] DOB:21-Nov-1932, 88 y.o., female Today's Date: 11/12/2021  PCP: Maryland Pink, MD and Minna Merritts, MD (both listed per chart) REFERRING PROVIDER: Maryland Pink, MD    PT End of Session - 11/12/21 0949     Visit Number 4    Number of Visits 17    Date for PT Re-Evaluation 11/15/21    PT Start Time 0850    PT Stop Time 0930    PT Time Calculation (min) 40 min    Equipment Utilized During Treatment Gait belt    Activity Tolerance Patient tolerated treatment well    Behavior During Therapy [facility] for tasks assessed/performed               Past Medical History:  Diagnosis Date   Anal cancer ([place])    Diabetes mellitus    Hyperlipidemia    Hypertension    Past Surgical History:  Procedure Laterality Date   BLADDER SURGERY     CARDIAC CATHETERIZATION     COLOSTOMY     CORONARY ANGIOPLASTY WITH STENT PLACEMENT  03/25/2018   Stent to the LCX  2.50 mm x 10m  [employer] [id number]  heart stent     TONSILLECTOMY     uterus surgery     removed   Patient Active Problem List   Diagnosis Date Noted   Absolute anemia 01/09/2021   Lung nodule 01/09/2021   Anemia in stage 3a chronic kidney disease ([place] 01/09/2021   B12 deficiency 01/09/2021   Stage 3a chronic kidney disease ([place] 01/09/2021   Goals of care, counseling/discussion 12/18/2020   History of anal cancer 12/11/2020   Bronchitis 12/15/2012   CAD (coronary artery disease) 11/13/2010   HTN (hypertension) 11/13/2010   Diabetes mellitus ([place] 11/13/2010   Hyperlipidemia 11/13/2010   Obesity 11/13/2010    REFERRING DIAG: Dizziness and giddiness  THERAPY DIAG:  Dizziness and giddiness  Unsteadiness on feet  Muscle weakness (generalized)  Rationale for Evaluation and Treatment Rehabilitation  PERTINENT HISTORY: Pt is a pleasant 88y/o female presenting to PT for dizziness. She  is ambulating without an AD. Pt reports hx of BPPV. She had new onset of dizziness that started in March and states she was "dizzy all the time." Pt was prescribed meclizine and reports significant improvement in dizziness since taking medication. She now only feels brief dizziness when rolling in her bed or bending to pick something up. She describes her symptoms as feeling as if she is spinning and that it lasts for seconds. She had vomited in the past but reports no nausea or vomiting recently. Pt denies any falls in the past six months. Pt is not entirely sure she "needs physical therapy" but wanted to follow up regarding lingering symptoms. Pt denies any spontaneous symptoms. Pt has difficulty hearing and has an upcoming appointment to get hearing aids. Pt has been seen by a neurologist and reports nothing was found to be "wrong with my mind."  Pt wears glasses. She has chronic n/t in hands and feet due to neuropathy. Pt with chronic neck pain with movement. PMH significant for abdominal hernia, arthritis, asthma, breast tumor (benign), cerebrovascular disease, coronary artery disease, depression, diabetes, dyslipidemia, gastritis, CA, cataract, myocardial infarction, HTN, sleep apnea, idiopathic peripheral neuropathy, low back pain radiating to L leg, L cervical radiculopathy, B12 deficiency    PRECAUTIONS: Fall, pt has ostomy bag  SUBJECTIVE: Pt had to  cancel last appointment due to throwing up, says "I do that sometimes." Feeling better today. She reports no recent spinning, but some lightheadedness with turning over in her bed.  PAIN:  Pt says she has pain all the time Are you having pain? Yes: NPRS scale: not rated/10 Pain location: low back Pain description: ache, chronic issue Aggravating factors:   Relieving factors:       TODAY'S TREATMENT:   11/12/2021: Gait belt donned and placed above ostomy bag for session, CGA provided unless otherwise noted  Standing VORx1 horiz and vert head  turns 30 sec each no symptoms, no decrease in postural stability    Body rolls using wall as TC 6x no symptoms       On mat table   Roll test - negative B, does report some vague symptoms on left [employer] - negative R, spinning reported L but only downbeat observed, although of increased intensity on L. Pt reports worsening dizziness on L  Epley L side 1x. Did educate pt that maneuver may not improve symptoms due to nature of nystagmus observed. Pt agreeable to plan to attempt Epley. Epley provided as pt did experience significant dizziness, and reported spinning with L Marye Round. Comments: downbeat nystagmus observed with all positions of each test and maneuver. Discussed this with pt, provided written handout of observed nystagmus for pt to bring up with her physician. Pt agreeable to plan and verbalized understanding.   Pt reports feeling better following maneuver.    Discussed/reviewed safety precautions with pt following maneuvers. Pt verbalized understanding.       One foot on floor, one on 6" step 2x30 sec each LE    Tandem stance 2x30 sec each LE   PATIENT EDUCATION: Education details: Pt educated throughout session about proper posture and technique with exercises. Improved exercise technique, movement at target joints, use of target muscles after min to mod verbal, visual, tactile cues. Plan  educated: Patient Education method: Explanation, Demonstration, Tactile cues, and Verbal cues Education comprehension: verbalized understanding, returned demonstration, and needs further education   HOME EXERCISE PROGRAM:   No updates 11/12/2021. Pt to continue HEP as previously given.   Access Code: [email or web address] URL: [email or web address] Date: 10/25/2021 Prepared by: Ricard Dillon  Exercises - Single Leg Balance with Knee Flexion with PLB  - 1 x daily - 7 x weekly - 2 sets - 1 reps - 30 hold; instruction to perform at counter top (support surface) with sturdy chair  behind her  - Seated March with Resistance  - 1 x daily - 5 x weekly - 3 sets - 15 reps - Seated Hip Abduction with Resistance  - 1 x daily - 5 x weekly - 3 sets - 15 reps   PT Short Term Goals -      PT SHORT TERM GOAL #1   Title Pt will be independent with HEP in order to improve strength and balance in order to decrease fall risk and improve function at home and work.    Baseline 6/9: deferred to next session if appropriate    Time 4    Period Weeks    Status New    Target Date 10/18/21              PT Long Term Goals -      PT LONG TERM GOAL #1   Title Pt will improve DGI by at least 3 points in order to demonstrate clinically significant improvement in balance and decreased risk for  falls.    Baseline 6/9: 20/24    Time 8    Period Weeks    Status New    Target Date 11/15/21      PT LONG TERM GOAL #2   Title Pt will report no dizziness within the past week when rolling over in her bed or when bending to pick something up to improve QOL and ease/safety with ADLs.    Baseline 6/9: Pt currently has dizziness with these activities    Time 8    Period Weeks    Status New    Target Date 11/15/21      PT LONG TERM GOAL #3   Title Pt will decrease DHI score by at least 18 points in order to demonstrate clinically significant reduction in disability    Baseline 6/9: 34/100    Time 8    Period Weeks    Status New    Target Date 11/15/21              Plan -     Clinical Impression Statement Reassessment of Roll test and Dix as pt with symptoms with rolling in her bed. Provided L side Epley as pt reported spinning with L Dix Hallpike, increased dizziness. Pt did report feeling better following maneuver, see above for details. However, PT did provide pt with written handout of nystagmus observed throughout (downbeat) and to bring this up with her physician as this is not consistent with BPPV. Pt verbalized understanding. Pt would like next appointment to be her last PT  session. The pt will benefit from further skilled PT to improve strength and balance in order to decrease fall risk and improve QOL.    Personal Factors and Comorbidities Age;Past/Current Experience;Sex;Time since onset of injury/illness/exacerbation;Comorbidity 3+    Comorbidities PMH significant for abdominal hernia, arthritis, asthma, breast tumor (benign), cerebrovascular disease, coronary artery disease, depression, diabetes, dyslipidemia, gastritis, CA, cataract, myocardial infarction, HTN, sleep apnea, idiopathic peripheral neuropathy, low back pain radiating to L leg, L cervical radiculopathy, B12 deficiency    Examination-Activity Limitations Bed Mobility;Bend;Locomotion Level;Stairs    Examination-Participation Restrictions Other;Laundry;Yard Work    [employer]chant [employer] officer Evolving/Moderate complexity    Rehab Potential Good    PT Frequency 2x / week    PT Duration 8 weeks    PT Treatment/Interventions ADLs/Self Care Home Management;Canalith Repostioning;Cryotherapy;Moist Heat;Electrical Stimulation;Traction;DME Instruction;Gait training;Stair training;Functional mobility training;Therapeutic activities;Therapeutic exercise;Balance training;Neuromuscular re-education;Patient/family education;Orthotic Fit/Training;Manual techniques;Passive range of motion;Dry needling;Energy conservation;Taping;Vestibular;Visual/perceptual remediation/compensation;Joint Manipulations    PT Next Visit Plan repeat maneuver as indicated/appropriate    PT Home Exercise Plan provide next 1-2 visits if indicated    Consulted and Agree with Plan of Care Patient               Zollie Pee, PT 11/12/2021, 9:50 AM

## 2021-11-15 ENCOUNTER — Ambulatory Visit: Payer: PPO

## 2021-11-18 ENCOUNTER — Ambulatory Visit: Payer: PPO

## 2021-11-18 ENCOUNTER — Telehealth: Payer: Self-pay

## 2021-11-18 NOTE — Telephone Encounter (Signed)
PT contacted pt over secure phone line due to missed visit this morning and left VM. PT reminded pt that this was her last scheduled appointment and to call back to confirm if she'd like to reschedule or discontinue PT.  Ricard Dillon PT, DPT

## 2021-11-22 ENCOUNTER — Ambulatory Visit: Payer: PPO

## 2021-11-29 ENCOUNTER — Ambulatory Visit: Payer: PPO

## 2021-11-30 ENCOUNTER — Observation Stay
Admission: EM | Admit: 2021-11-30 | Discharge: 2021-12-01 | Disposition: A | Discharge status: Home / Self Care | Payer: PPO | Attending: Obstetrics and Gynecology | Admitting: Obstetrics and Gynecology

## 2021-11-30 ENCOUNTER — Encounter: Payer: Self-pay | Admitting: Emergency Medicine

## 2021-11-30 ENCOUNTER — Other Ambulatory Visit: Payer: Self-pay

## 2021-11-30 ENCOUNTER — Emergency Department: Payer: PPO

## 2021-11-30 DIAGNOSIS — I10 Essential (primary) hypertension: Secondary | ICD-10-CM | POA: Diagnosis present

## 2021-11-30 DIAGNOSIS — M79603 Pain in arm, unspecified: Secondary | ICD-10-CM | POA: Diagnosis not present

## 2021-11-30 DIAGNOSIS — I129 Hypertensive chronic kidney disease with stage 1 through stage 4 chronic kidney disease, or unspecified chronic kidney disease: Secondary | ICD-10-CM | POA: Insufficient documentation

## 2021-11-30 DIAGNOSIS — Z7902 Long term (current) use of antithrombotics/antiplatelets: Secondary | ICD-10-CM | POA: Insufficient documentation

## 2021-11-30 DIAGNOSIS — E1122 Type 2 diabetes mellitus with diabetic chronic kidney disease: Secondary | ICD-10-CM | POA: Insufficient documentation

## 2021-11-30 DIAGNOSIS — I4891 Unspecified atrial fibrillation: Secondary | ICD-10-CM | POA: Diagnosis not present

## 2021-11-30 DIAGNOSIS — I447 Left bundle-branch block, unspecified: Secondary | ICD-10-CM | POA: Insufficient documentation

## 2021-11-30 DIAGNOSIS — R5383 Other fatigue: Secondary | ICD-10-CM | POA: Insufficient documentation

## 2021-11-30 DIAGNOSIS — D631 Anemia in chronic kidney disease: Secondary | ICD-10-CM | POA: Insufficient documentation

## 2021-11-30 DIAGNOSIS — W19XXXA Unspecified fall, initial encounter: Secondary | ICD-10-CM | POA: Diagnosis not present

## 2021-11-30 DIAGNOSIS — N1831 Chronic kidney disease, stage 3a: Secondary | ICD-10-CM | POA: Diagnosis present

## 2021-11-30 DIAGNOSIS — E119 Type 2 diabetes mellitus without complications: Secondary | ICD-10-CM

## 2021-11-30 DIAGNOSIS — Z7984 Long term (current) use of oral hypoglycemic drugs: Secondary | ICD-10-CM | POA: Diagnosis not present

## 2021-11-30 DIAGNOSIS — Z6831 Body mass index (BMI) 31.0-31.9, adult: Secondary | ICD-10-CM | POA: Insufficient documentation

## 2021-11-30 DIAGNOSIS — R079 Chest pain, unspecified: Secondary | ICD-10-CM | POA: Diagnosis not present

## 2021-11-30 DIAGNOSIS — Z955 Presence of coronary angioplasty implant and graft: Secondary | ICD-10-CM | POA: Insufficient documentation

## 2021-11-30 DIAGNOSIS — R269 Unspecified abnormalities of gait and mobility: Secondary | ICD-10-CM | POA: Diagnosis not present

## 2021-11-30 DIAGNOSIS — R0789 Other chest pain: Secondary | ICD-10-CM | POA: Diagnosis not present

## 2021-11-30 DIAGNOSIS — Z7901 Long term (current) use of anticoagulants: Secondary | ICD-10-CM | POA: Insufficient documentation

## 2021-11-30 DIAGNOSIS — I25118 Atherosclerotic heart disease of native coronary artery with other forms of angina pectoris: Secondary | ICD-10-CM | POA: Insufficient documentation

## 2021-11-30 DIAGNOSIS — E669 Obesity, unspecified: Secondary | ICD-10-CM | POA: Insufficient documentation

## 2021-11-30 DIAGNOSIS — I6782 Cerebral ischemia: Secondary | ICD-10-CM | POA: Diagnosis not present

## 2021-11-30 DIAGNOSIS — Z20822 Contact with and (suspected) exposure to covid-19: Secondary | ICD-10-CM | POA: Diagnosis not present

## 2021-11-30 DIAGNOSIS — S0990XA Unspecified injury of head, initial encounter: Secondary | ICD-10-CM | POA: Insufficient documentation

## 2021-11-30 DIAGNOSIS — R531 Weakness: Secondary | ICD-10-CM | POA: Insufficient documentation

## 2021-11-30 DIAGNOSIS — Z85048 Personal history of other malignant neoplasm of rectum, rectosigmoid junction, and anus: Secondary | ICD-10-CM | POA: Diagnosis not present

## 2021-11-30 DIAGNOSIS — I480 Paroxysmal atrial fibrillation: Principal | ICD-10-CM | POA: Insufficient documentation

## 2021-11-30 DIAGNOSIS — E785 Hyperlipidemia, unspecified: Secondary | ICD-10-CM | POA: Diagnosis not present

## 2021-11-30 DIAGNOSIS — G4489 Other headache syndrome: Secondary | ICD-10-CM | POA: Diagnosis not present

## 2021-11-30 DIAGNOSIS — R110 Nausea: Secondary | ICD-10-CM | POA: Insufficient documentation

## 2021-11-30 DIAGNOSIS — R0602 Shortness of breath: Secondary | ICD-10-CM | POA: Diagnosis not present

## 2021-11-30 DIAGNOSIS — M542 Cervicalgia: Secondary | ICD-10-CM | POA: Diagnosis not present

## 2021-11-30 DIAGNOSIS — I3481 Nonrheumatic mitral (valve) annulus calcification: Secondary | ICD-10-CM | POA: Diagnosis not present

## 2021-11-30 DIAGNOSIS — I2510 Atherosclerotic heart disease of native coronary artery without angina pectoris: Secondary | ICD-10-CM | POA: Diagnosis present

## 2021-11-30 DIAGNOSIS — R519 Headache, unspecified: Secondary | ICD-10-CM | POA: Insufficient documentation

## 2021-11-30 LAB — BASIC METABOLIC PANEL
Anion gap: 10 (ref 5–15)
BUN: 16 mg/dL (ref 8–23)
CO2: 21 mmol/L — ABNORMAL LOW (ref 22–32)
Calcium: 8.4 mg/dL — ABNORMAL LOW (ref 8.9–10.3)
Chloride: 108 mmol/L (ref 98–111)
Creatinine, Ser: 1.26 mg/dL — ABNORMAL HIGH (ref 0.44–1.00)
GFR, Estimated: 42 mL/min — ABNORMAL LOW (ref >60)
Glucose, Bld: 152 mg/dL — ABNORMAL HIGH (ref 70–99)
Potassium: 3.7 mmol/L (ref 3.5–5.1)
Sodium: 139 mmol/L (ref 135–145)

## 2021-11-30 LAB — CBC
HCT: 36.6 % (ref 36.0–46.0)
Hemoglobin: 12 g/dL (ref 12.0–15.0)
MCH: 31.4 pg (ref 26.0–34.0)
MCHC: 32.8 g/dL (ref 30.0–36.0)
MCV: 95.8 fL (ref 80.0–100.0)
Platelets: 208 10*3/uL (ref 150–400)
RBC: 3.82 MIL/uL — ABNORMAL LOW (ref 3.87–5.11)
RDW: 14.8 % (ref 11.5–15.5)
WBC: 3.7 10*3/uL — ABNORMAL LOW (ref 4.0–10.5)
nRBC: 0 % (ref 0.0–0.2)

## 2021-11-30 LAB — TROPONIN I (HIGH SENSITIVITY)
Troponin I (High Sensitivity): 11 ng/L (ref <18)
Troponin I (High Sensitivity): 13 ng/L (ref <18)

## 2021-11-30 LAB — SARS CORONAVIRUS 2 BY RT PCR: SARS Coronavirus 2 by RT PCR: NEGATIVE

## 2021-11-30 MED ORDER — ATORVASTATIN CALCIUM 20 MG PO TABS
20.0000 mg | ORAL_TABLET | Freq: Every morning | ORAL | Status: DC
  Administered 2021-12-01: 20 mg via ORAL
  Filled 2021-11-30: qty 1

## 2021-11-30 MED ORDER — AMIODARONE LOAD VIA INFUSION
150.0000 mg | Freq: Once | INTRAVENOUS | Status: AC
Start: 2021-11-30 — End: 2021-11-30
  Administered 2021-11-30: 150 mg via INTRAVENOUS
  Filled 2021-11-30: qty 83.34

## 2021-11-30 MED ORDER — ACETAMINOPHEN 500 MG PO TABS
1000.0000 mg | ORAL_TABLET | Freq: Once | ORAL | Status: AC
  Administered 2021-11-30: 1000 mg via ORAL
  Filled 2021-11-30: qty 2

## 2021-11-30 MED ORDER — AMIODARONE HCL IN DEXTROSE 360-4.14 MG/200ML-% IV SOLN: 60.0000 mg/h | INTRAVENOUS | Status: AC

## 2021-11-30 MED ORDER — SODIUM CHLORIDE 0.9 % IV BOLUS
1000.0000 mL | Freq: Once | INTRAVENOUS | Status: AC
  Administered 2021-11-30: 1000 mL via INTRAVENOUS

## 2021-11-30 MED ORDER — AMIODARONE HCL IN DEXTROSE 360-4.14 MG/200ML-% IV SOLN
INTRAVENOUS | Status: AC
  Administered 2021-11-30: 60 mg/h via INTRAVENOUS
  Filled 2021-11-30: qty 200

## 2021-11-30 MED ORDER — ONDANSETRON HCL 4 MG/2ML IJ SOLN: 4.0000 mg | Freq: Four times a day (QID) | INTRAMUSCULAR | Status: DC | PRN

## 2021-11-30 MED ORDER — INSULIN ASPART 100 UNIT/ML IJ SOLN
0.0000 [IU] | Freq: Three times a day (TID) | INTRAMUSCULAR | Status: DC
  Administered 2021-12-01 (×2): 2 [IU] via SUBCUTANEOUS
  Filled 2021-11-30 (×2): qty 1

## 2021-11-30 MED ORDER — ACETAMINOPHEN 325 MG PO TABS
650.0000 mg | ORAL_TABLET | ORAL | Status: DC | PRN
Start: 2021-11-30 — End: 2021-12-01
  Administered 2021-12-01 (×2): 650 mg via ORAL
  Filled 2021-11-30 (×2): qty 2

## 2021-11-30 MED ORDER — RIVAROXABAN 15 MG PO TABS
15.0000 mg | ORAL_TABLET | Freq: Every day | ORAL | Status: DC
  Administered 2021-12-01: 15 mg via ORAL
  Filled 2021-11-30: qty 1

## 2021-11-30 MED ORDER — DILTIAZEM HCL-DEXTROSE 125-5 MG/125ML-% IV SOLN (PREMIX)
5.0000 mg/h | INTRAVENOUS | Status: DC
  Administered 2021-11-30: 5 mg/h via INTRAVENOUS
  Filled 2021-11-30: qty 125

## 2021-11-30 MED ORDER — DILTIAZEM HCL 25 MG/5ML IV SOLN
10.0000 mg | Freq: Once | INTRAVENOUS | Status: AC
  Administered 2021-11-30: 10 mg via INTRAVENOUS
  Filled 2021-11-30: qty 5

## 2021-11-30 MED ORDER — LACTATED RINGERS IV BOLUS
250.0000 mL | Freq: Once | INTRAVENOUS | Status: AC
  Administered 2021-11-30: 250 mL via INTRAVENOUS

## 2021-11-30 MED ORDER — AMIODARONE HCL IN DEXTROSE 360-4.14 MG/200ML-% IV SOLN: 30.0000 mg/h | INTRAVENOUS | Status: DC

## 2021-11-30 MED ORDER — ACETAMINOPHEN 325 MG PO TABS: 650.0000 mg | ORAL_TABLET | Freq: Once | ORAL | Status: DC

## 2021-11-30 MED ORDER — INSULIN ASPART 100 UNIT/ML IJ SOLN: 0.0000 [IU] | Freq: Every day | INTRAMUSCULAR | Status: DC

## 2021-11-30 MED ORDER — MIDODRINE HCL 5 MG PO TABS
5.0000 mg | ORAL_TABLET | Freq: Once | ORAL | Status: AC
  Administered 2021-12-01: 5 mg via ORAL
  Filled 2021-11-30: qty 1

## 2021-11-30 MED ORDER — SODIUM CHLORIDE 0.9 % IV BOLUS
500.0000 mL | Freq: Once | INTRAVENOUS | Status: AC
  Administered 2021-11-30: 500 mL via INTRAVENOUS

## 2021-11-30 NOTE — ED Triage Notes (Signed)
Patient in triage with POA stating patient fell on Thursday falling onto her backside. Denies hitting head. C/o right shoulder pain, neck pain and chest pain and chills.

## 2021-11-30 NOTE — ED Notes (Signed)
Pt given a diet ginger ale

## 2021-11-30 NOTE — ED Triage Notes (Signed)
First Nurse Note:  Pt via EMS from home. Pt c/o chest pain, aching pain. Also, c/o chills, neck pain, R arm pain that started right before she called EMS. States she has some SOB on exertion. EMS reports poor skin turgor. Pt has a hx of a fib. 12 lead showed a fib 70s-140s. Pt is A&Ox4 and NAD  EMS gave 324 ASA 20G L AC and has given 248m  Pt placed a Nitro patch, EMS removed it.  99.2 166 CBG 97% on RA 119/48

## 2021-11-30 NOTE — Progress Notes (Signed)
[employer] - Patient with continued hypotension post bolus . She is now in sinus rhythm. Amiodarone still infusing Action - Additional 250 LR bolus and dose of midodrine Response -

## 2021-11-30 NOTE — ED Provider Notes (Signed)
[facility] Provider Note    Event Date/Time   First MD Initiated Contact with Patient 11/30/21 1834     (approximate)   History   Chest Pain and Fall   HPI  Sarah Potts is a 88 y.o. female with a history of A-fib on Xarelto and metoprolol presents to the ER for evaluation of nausea some headache.  She did have a fall 2 days ago may have hit her head but does not really recall.  She denies any abdominal pain.  No chest pain.  Does feel weak and fatigued.     Physical Exam   Triage Vital Signs: ED Triage Vitals  Enc Vitals Group     BP 11/30/21 1753 103/65     Pulse Rate 11/30/21 1753 98     Resp 11/30/21 1753 18     Temp 11/30/21 1753 98.2 F (36.8 C)     Temp Source 11/30/21 1753 Oral     SpO2 11/30/21 1753 94 %     Weight 11/30/21 1756 172 lb (78 kg)     Height 11/30/21 1756 '5\' 2"'$  (1.575 m)     Head Circumference --      Peak Flow --      Pain Score 11/30/21 1756 9     Pain Loc --      Pain Edu? --      Excl. in [place]? --     Most recent vital signs: Vitals:   11/30/21 2157 11/30/21 2201  BP: (!) 98/52   Pulse: 87   Resp: 18   Temp:  98.3 F (36.8 C)  SpO2: 98%      Constitutional: Alert  Eyes: Conjunctivae are normal.  Head: Atraumatic. Nose: No congestion/rhinnorhea. Mouth/Throat: Mucous membranes are moist.   Neck: Painless ROM.  Cardiovascular:   Good peripheral circulation.  Irregularly irregular tachycardic Respiratory: Normal respiratory effort.  No retractions.  Gastrointestinal: Soft and nontender.  Musculoskeletal:  no deformity Neurologic:  MAE spontaneously. No gross focal neurologic deficits are appreciated.  Skin:  Skin is warm, dry and intact. No rash noted. Psychiatric: Mood and affect are normal. Speech and behavior are normal.    ED Results / Procedures / Treatments   Labs (all labs ordered are listed, but only abnormal results are displayed) Labs Reviewed  BASIC METABOLIC PANEL - Abnormal; Notable  for the following components:      Result Value   CO2 21 (*)    Glucose, Bld 152 (*)    Creatinine, Ser 1.26 (*)    Calcium 8.4 (*)    GFR, Estimated 42 (*)    All other components within normal limits  CBC - Abnormal; Notable for the following components:   WBC 3.7 (*)    RBC 3.82 (*)    All other components within normal limits  SARS CORONAVIRUS 2 BY RT PCR  TROPONIN I (HIGH SENSITIVITY)  TROPONIN I (HIGH SENSITIVITY)     EKG  ED ECG REPORT I, Merlyn Lot, the attending physician, personally viewed and interpreted this ECG.   Date: 11/30/2021  EKG Time: 18:50  Rate: 160  Rhythm: afib rvr  Axis: left  Intervals:  lbbb  ST&T Change: non specific st abn likely rate dependent    RADIOLOGY Please see ED Course for my review and interpretation.  I personally reviewed all radiographic images ordered to evaluate for the above acute complaints and reviewed radiology reports and findings.  These findings were personally discussed with the patient.  Please see medical record for radiology report.    PROCEDURES:  Critical Care performed: Yes, see critical care procedure note(s)  .Critical Care  Performed by: Merlyn Lot, MD Authorized by: Merlyn Lot, MD   Critical care provider statement:    Critical care time (minutes):  40   Critical care was necessary to treat or prevent imminent or life-threatening deterioration of the following conditions:  Circulatory failure   Critical care was time spent personally by me on the following activities:  Ordering and performing treatments and interventions, ordering and review of laboratory studies, ordering and review of radiographic studies, pulse oximetry, re-evaluation of patient's condition, review of old charts, obtaining history from patient or surrogate, examination of patient, evaluation of patient's response to treatment, discussions with primary provider, discussions with consultants and development of  treatment plan with patient or surrogate .1-3 Lead EKG Interpretation  Performed by: Merlyn Lot, MD Authorized by: Merlyn Lot, MD     Interpretation: abnormal     ECG rate:  150   ECG rate assessment: tachycardic     Rhythm: atrial fibrillation      MEDICATIONS ORDERED IN ED: Medications  amiodarone (NEXTERONE PREMIX) 360-4.14 MG/200ML-% (1.8 mg/mL) IV infusion (0 mg/hr Intravenous Stopped 11/30/21 2159)  amiodarone (NEXTERONE PREMIX) 360-4.14 MG/200ML-% (1.8 mg/mL) IV infusion (has no administration in time range)  sodium chloride 0.9 % bolus 500 mL (0 mLs Intravenous Stopped 11/30/21 2020)  diltiazem (CARDIZEM) injection 10 mg (10 mg Intravenous Given 11/30/21 1909)  sodium chloride 0.9 % bolus 1,000 mL (0 mLs Intravenous Stopped 11/30/21 2146)  amiodarone (NEXTERONE) 1.8 mg/mL load via infusion 150 mg (150 mg Intravenous Bolus from Bag 11/30/21 2127)  acetaminophen (TYLENOL) tablet 1,000 mg (1,000 mg Oral Given 11/30/21 2128)     IMPRESSION / MDM / ASSESSMENT AND PLAN / ED COURSE  I reviewed the triage vital signs and the nursing notes.                              Differential diagnosis includes, but is not limited to, dysrhythmia, dehydration, electrolyte abnormality, CHF, ACS, SDH, IPH, COVID,  Patient presented to the ER for evaluation of symptoms as described above.  This presenting complaint could reflect a potentially life-threatening illness therefore the patient will be placed on continuous pulse oximetry and telemetry for monitoring.  Laboratory evaluation will be sent to evaluate for the above complaints.  Patient is tachycardic with EKG evidence of A-fib with RVR.  She is well perfused with strong PT and DP pulses brisk cap refill.  Will give fluids.  Will give Cardizem.   Clinical Course as of 11/30/21 2201  Sat Nov 30, 2021  1833 Chest x-ray on my review and interpretation without any evidence of pneumothorax. [PR]  2047 Blood pressures are soft with  diltiazem.  Her heart rate is much better controlled.  Discussed case in consultation with Dr. Rockey Situ given her presentation is recommended trial of amiodarone infusion. [PR]  2116 Discussed case in consultation with hospitalist for admission. [PR]    Clinical Course User Index [PR] Merlyn Lot, MD     FINAL CLINICAL IMPRESSION(S) / ED DIAGNOSES   Final diagnoses:  Atrial fibrillation with RVR ([place])     Rx / DC Orders   ED Discharge Orders     None        Note:  This document was prepared using Dragon voice recognition software and may include unintentional dictation errors.  Merlyn Lot, MD 11/30/21 2201

## 2021-11-30 NOTE — H&P (Signed)
History and Physical    Patient: Sarah Potts YQM:[id number] DOB: 06/11/1932 DOA: 11/30/2021 DOS: the patient was seen and examined on 11/30/2021 PCP: [place] Pink, MD  Patient coming from: Home  Chief Complaint:  Chief Complaint  Patient presents with   Chest Pain   Fall   HPI: Sarah Potts is a 88 y.o. 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  Patient has had controlled heart rate apparently with metoprolol.  But today she was doing fine apparently went to the grocery store with her son she came back later and started feeling bad.  She started shaking while in bed.  She was brought to the ER where she was found to be in A-fib with RVR.  Patient reported nausea with headache.  She apparently had a fall 2 days ago at that time she thought she hit her head but cannot recall what happened.  In the ER she was initiated on Cardizem but blood pressure has been dropping.  Patient was switched to IV amiodarone.  At this point she remained mildly hypotensive.  She has spontaneously converted to sinus rhythm at the moment.  Patient was reporting chest pain at some point.  She is however hemodynamically stable except for mild hypotension.  Review of Systems: As mentioned in the history of present illness. All other systems reviewed and are negative. Past Medical History:  Diagnosis Date   Anal cancer ([place])    Diabetes mellitus    Hyperlipidemia    Hypertension    Past Surgical History:  Procedure Laterality Date   BLADDER SURGERY     CARDIAC CATHETERIZATION     COLOSTOMY     CORONARY ANGIOPLASTY WITH STENT PLACEMENT  03/25/2018   Stent to the LCX  2.50 mm x 59m  [employer] [id number]  heart stent     TONSILLECTOMY     uterus surgery     removed    Social History:  reports that she has never smoked. She has never used smokeless tobacco. She reports that she does not drink alcohol and does not use drugs.  Allergies  Allergen Reactions   Levofloxacin Nausea Only    Patient reports that she thinks that she could take this medication.    Niacin Other (See Comments)    Hot, Flushed   Niacin And Related Other (See Comments)    Pt states she get real red and hot.    Family History  Problem Relation Age of Onset   Cancer Mother    Breast cancer Mother    Heart disease Father    Heart attack Father    Cancer Sister    Heart disease Sister    Cervical cancer Sister    Heart disease Brother     Prior to Admission medications   Medication Sig Start Date End Date Taking? Authorizing Provider  ALPRAZolam (Duanne Moron 0.5 MG tablet Take 1 tablet (0.5 mg total) by mouth See admin instructions. Take 1 tablet prior to MRI appointment. You may repeat another dose. thanks Patient not taking: Reported on 10/11/2021 01/04/21   YEarlie Server MD  amLODipine (NORVASC) 5 MG tablet Take 1 tablet (5 mg total) by mouth in the morning. 02/05/21   GMinna Merritts MD  atorvastatin (LIPITOR) 20 MG tablet Take 1 tablet (20 mg total) by  mouth in the morning. For cholesterol 02/05/21   Minna Merritts, MD  Cholecalciferol 25 MCG (1000 UT) tablet Take by mouth. Pt takes 3 daily    [provider]  ezetimibe (ZETIA) 10 MG tablet Take 1 tablet (10 mg total) by mouth in the morning. For cholesterol 02/05/21   Gollan, Kathlene November, MD  Iron-Vitamin C 65-125 MG TABS Take 1 tablet by mouth daily. 01/09/21   [name], MD  losartan (COZAAR) 100 MG tablet Take 1 tablet (100 mg total) by mouth in the morning. 02/05/21   Minna Merritts, MD  magnesium oxide (MAG-OX) 400 MG tablet Take by mouth.    [provider]  metFORMIN (GLUMETZA) 1000 MG (MOD) 24 hr tablet Take 1,000 mg by mouth 2 (two) times daily with a meal.      [provider]  metoprolol  succinate (TOPROL-XL) 50 MG 24 hr tablet Take 1 tablet (50 mg total) by mouth daily. 02/05/21 09/20/21  Minna Merritts, MD  mirabegron ER (MYRBETRIQ) 50 MG TB24 tablet Take by mouth.    [provider]  montelukast (SINGULAIR) 10 MG tablet Take 10 mg by mouth at bedtime.      [provider]  nitroGLYCERIN (NITRODUR - DOSED IN MG/24 HR) 0.2 mg/hr patch PLEASE SEE ATTACHED FOR DETAILED DIRECTIONS 01/22/21   [provider]  omega-3 acid ethyl esters (LOVAZA) 1 g capsule Take 2 g by mouth 2 (two) times daily.      [provider]  omeprazole (PRILOSEC) 40 MG capsule Take 40 mg by mouth daily.      [provider]  pregabalin (LYRICA) 75 MG capsule Take 75 mg by mouth 2 (two) times daily. 01/13/21   [provider]  Rivaroxaban (XARELTO) 15 MG TABS tablet Take 1 tablet (15 mg total) by mouth daily. 02/05/21   Minna Merritts, MD  sertraline (ZOLOFT) 50 MG tablet Take 1 tablet by mouth daily. Pt taking 1/2 daily 12/05/20   [provider]  traZODone (DESYREL) 50 MG tablet Take 1 tablet by mouth at bedtime. 01/22/21 01/22/22  [provider]  TRULICITY 1.5 WU/1.3KG SOPN Inject into the skin once a week. 12/16/20   [provider]    Physical Exam: Vitals:   11/30/21 2028 11/30/21 2044 11/30/21 2157 11/30/21 2201  BP: 93/64 (!) 104/58 (!) 98/52   Pulse: (!) 115  87   Resp: (!) 21  18   Temp:    98.3 F (36.8 C)  TempSrc:      SpO2: 100%  98%   Weight:      Height:       General: Stable, no distress, anxious HEENT: PERRLA, EOMI, no pallor or jaundice Neck: No JVD, no lymphadenopathy Respiratory: Good air entry bilaterally, no wheeze no rales no crackles Cardiovascular system: Irregular irregular with tachycardia Abdomen: Soft, nontender with positive bowel sounds Extremity: No significant edema cyanosis or clubbing Skin: No rashes or ulcers Neuro: Cranial nerves II to XII intact, normal tone, normal reflexes,  no focal weakness Psych: Anxious, awake alert oriented x3  Data Reviewed:  Pulse 115 blood pressure 86/42, white count 3.7, creatinine 1.26, CO2 21 calcium 8.4.  Troponin 11 and then 13 head CT without contrast showed no acute findings.  Chest x-ray showed no acute findings.  EKG showed atrial fibrillation with rapid response.  Assessment and Plan:   #1 atrial fibrillation with rapid ventricular response: Patient has had controlled A-fib and is on chronic anticoagulation but  now has escaped RVR.  Patient will be admitted.  Did not do well with Cardizem.  Amiodarone drip started.  We will turn it off as patient has just spontaneously converted to sinus rhythm.  Observe patient and restart amiodarone if A-fib with RVR returns.  Her cardiologist is aware.  We will follow-up with patient in the morning.  Check TSH.  Patient has no recent echo in our system.  We will consider repeat echo.  #2 diabetes, non-insulin-dependent: Sliding scale insulin  #3 hypotension: Patient has history of essential hypertension but blood pressure is low.  Normal saline bolus was given.  We will continue with monitoring and restart home medication when blood pressure is rising.  #4 hyperlipidemia: Continue with statin  #5 coronary artery disease: Stable,  #6 chronic kidney disease stage IIIa: Appears to be at baseline.  Continue to monitor  #7 anemia of chronic kidney disease: Monitor H&H.     Advance Care Planning:   Code Status: Not on file full code  Consults: Cardiology Dr. Rockey Situ  Family Communication: Son and sister in the room  Severity of Illness: The appropriate patient status for this patient is INPATIENT. Inpatient status is judged to be reasonable and necessary in order to provide the required intensity of service to ensure the patient's safety. The patient's presenting symptoms, physical exam findings, and initial radiographic and laboratory data in the context of their chronic comorbidities is  felt to place them at high risk for further clinical deterioration. Furthermore, it is not anticipated that the patient will be medically stable for discharge from the hospital within 2 midnights of admission.   * I certify that at the point of admission it is my clinical judgment that the patient will require inpatient hospital care spanning beyond 2 midnights from the point of admission due to high intensity of service, high risk for further deterioration and high frequency of surveillance required.*  AuthorBarbette Merino, MD 11/30/2021 10:11 PM  For on call review [email or web address].

## 2021-12-01 ENCOUNTER — Inpatient Hospital Stay (HOSPITAL_COMMUNITY)
Admit: 2021-12-01 | Discharge: 2021-12-01 | Disposition: A | Discharge status: Home / Self Care | Payer: PPO | Attending: Obstetrics and Gynecology | Admitting: Obstetrics and Gynecology

## 2021-12-01 DIAGNOSIS — I25118 Atherosclerotic heart disease of native coronary artery with other forms of angina pectoris: Secondary | ICD-10-CM

## 2021-12-01 DIAGNOSIS — I10 Essential (primary) hypertension: Secondary | ICD-10-CM

## 2021-12-01 DIAGNOSIS — N1831 Chronic kidney disease, stage 3a: Secondary | ICD-10-CM

## 2021-12-01 DIAGNOSIS — D631 Anemia in chronic kidney disease: Secondary | ICD-10-CM | POA: Diagnosis not present

## 2021-12-01 DIAGNOSIS — E1159 Type 2 diabetes mellitus with other circulatory complications: Secondary | ICD-10-CM | POA: Diagnosis not present

## 2021-12-01 DIAGNOSIS — I4891 Unspecified atrial fibrillation: Secondary | ICD-10-CM | POA: Diagnosis not present

## 2021-12-01 DIAGNOSIS — J45909 Unspecified asthma, uncomplicated: Secondary | ICD-10-CM | POA: Insufficient documentation

## 2021-12-01 LAB — TSH: TSH: 1.639 u[IU]/mL (ref 0.350–4.500)

## 2021-12-01 LAB — CBC
HCT: 30.2 % — ABNORMAL LOW (ref 36.0–46.0)
Hemoglobin: 9.7 g/dL — ABNORMAL LOW (ref 12.0–15.0)
MCH: 31.3 pg (ref 26.0–34.0)
MCHC: 32.1 g/dL (ref 30.0–36.0)
MCV: 97.4 fL (ref 80.0–100.0)
Platelets: 154 10*3/uL (ref 150–400)
RBC: 3.1 MIL/uL — ABNORMAL LOW (ref 3.87–5.11)
RDW: 15.4 % (ref 11.5–15.5)
WBC: 5.8 10*3/uL (ref 4.0–10.5)
nRBC: 0 % (ref 0.0–0.2)

## 2021-12-01 LAB — BASIC METABOLIC PANEL
Anion gap: 6 (ref 5–15)
BUN: 19 mg/dL (ref 8–23)
CO2: 22 mmol/L (ref 22–32)
Calcium: 7.8 mg/dL — ABNORMAL LOW (ref 8.9–10.3)
Chloride: 112 mmol/L — ABNORMAL HIGH (ref 98–111)
Creatinine, Ser: 1.32 mg/dL — ABNORMAL HIGH (ref 0.44–1.00)
GFR, Estimated: 39 mL/min — ABNORMAL LOW (ref >60)
Glucose, Bld: 147 mg/dL — ABNORMAL HIGH (ref 70–99)
Potassium: 4.7 mmol/L (ref 3.5–5.1)
Sodium: 140 mmol/L (ref 135–145)

## 2021-12-01 LAB — ECHOCARDIOGRAM COMPLETE
AR max vel: 1.41 cm2
AV Peak grad: 9.7 mmHg
Ao pk vel: 1.56 m/s
Area-P 1/2: 3.87 cm2
Calc EF: 66.2 %
Height: 62 in
MV VTI: 1.52 cm2
S' Lateral: 3.05 cm
Single Plane A2C EF: 68.8 %
Single Plane A4C EF: 64.2 %
Weight: 2752 oz

## 2021-12-01 LAB — LIPID PANEL
Cholesterol: 118 mg/dL (ref 0–200)
HDL: 38 mg/dL — ABNORMAL LOW (ref >40)
LDL Cholesterol: 49 mg/dL (ref 0–99)
Total CHOL/HDL Ratio: 3.1 RATIO
Triglycerides: 157 mg/dL — ABNORMAL HIGH (ref <150)
VLDL: 31 mg/dL (ref 0–40)

## 2021-12-01 LAB — T4, FREE: Free T4: 0.84 ng/dL (ref 0.61–1.12)

## 2021-12-01 LAB — HEMOGLOBIN A1C
Hgb A1c MFr Bld: 7.2 % — ABNORMAL HIGH (ref 4.8–5.6)
Mean Plasma Glucose: 159.94 mg/dL

## 2021-12-01 LAB — CBG MONITORING, ED
Glucose-Capillary: 141 mg/dL — ABNORMAL HIGH (ref 70–99)
Glucose-Capillary: 147 mg/dL — ABNORMAL HIGH (ref 70–99)

## 2021-12-01 MED ORDER — AMIODARONE HCL 200 MG PO TABS: ORAL_TABLET | ORAL | 1 refills | Status: DC

## 2021-12-01 MED ORDER — AMIODARONE HCL 200 MG PO TABS
200.0000 mg | ORAL_TABLET | Freq: Two times a day (BID) | ORAL | Status: DC
  Administered 2021-12-01: 200 mg via ORAL
  Filled 2021-12-01: qty 1

## 2021-12-01 MED ORDER — PERFLUTREN LIPID MICROSPHERE
1.0000 mL | INTRAVENOUS | Status: AC | PRN
  Administered 2021-12-01: 3 mL via INTRAVENOUS

## 2021-12-01 MED ORDER — METFORMIN HCL ER (MOD) 1000 MG PO TB24: 1000.0000 mg | ORAL_TABLET | Freq: Every day | ORAL | Status: DC

## 2021-12-01 MED ORDER — METOPROLOL SUCCINATE ER 50 MG PO TB24
50.0000 mg | ORAL_TABLET | Freq: Every day | ORAL | Status: DC
  Administered 2021-12-01: 50 mg via ORAL
  Filled 2021-12-01: qty 1

## 2021-12-01 NOTE — ED Notes (Signed)
Patient eating breakfast tray at this time. No needs expressed to RN. Call light within reach. Bed locked and lowered.

## 2021-12-01 NOTE — Progress Notes (Signed)
*  PRELIMINARY RESULTS* Echocardiogram 2D Echocardiogram has been performed. Definity IV ultrasound imaging agent used on this study.  Claretta Fraise 12/01/2021, 3:12 PM

## 2021-12-01 NOTE — Consult Note (Signed)
Cardiology Consultation:   Patient ID: TWYLLA ARCENEAUX MRN: [id number]; DOB: [birth year]  Admit date: 11/30/2021 Date of Consult: 12/01/2021  PCP:  [place] Pink, MD   [facility] Physician requesting consult Dr. Si Raider Reason for consult: Atrial fibrillation with RVR  Patient Profile:   Sarah Potts is a 88 y.o. female with a hx of paroxysmal atrial fibrillation, coronary artery disease, PCI to left circumflex 03/2018, diabetes, hypertension, hyperlipidemia, gait instability presenting with chest tightness, shaking, found to be in atrial fibrillation with RVR  History of Present Illness:   Sarah Potts reports that she was in her usual state of health, had gone out for breakfast with her son, going shopping, was laying down early afternoon around 230 when she developed some shaking.  Reports her whole body was shaking in the bed, felt she had mild low-grade fever, developed some chest pain, neck pain, called EMS for shortness of breath and symptoms as above. EMS noting atrial fibrillation with rapid rate Was given aspirin, Nitropatch removed (patient received these when she was in [place] years ago).  Blood pressure saturations stable In the emergency room diltiazem was initiated but stopped secondary to hypotension Case was discussed with myself yesterday evening, amiodarone bolus with infusion was initiated.  She converted to normal sinus rhythm shortly after This morning amiodarone has been held, reports having a good night, slept well no complaints.  Feels back to her baseline, ate a good breakfast Some medication confusion.  Thinks that she is taking metoprolol, not sure if she is taking losartan Has been using nitro patch here and there which she received while living in [place] years ago  Past Medical History:  Diagnosis Date   Anal cancer ([place])    Diabetes mellitus    Hyperlipidemia    Hypertension     Past Surgical History:  Procedure  Laterality Date   BLADDER SURGERY     CARDIAC CATHETERIZATION     COLOSTOMY     CORONARY ANGIOPLASTY WITH STENT PLACEMENT  03/25/2018   Stent to the LCX  2.50 mm x 62m  [employer] [id number]  heart stent     TONSILLECTOMY     uterus surgery     removed     Home Medications:  Prior to Admission medications   Medication Sig Start Date End Date Taking? Authorizing Provider  ALPRAZolam (Duanne Moron 0.5 MG tablet Take 1 tablet (0.5 mg total) by mouth See admin instructions. Take 1 tablet prior to MRI appointment. You may repeat another dose. thanks Patient not taking: Reported on 10/11/2021 01/04/21   YEarlie Server MD  amLODipine (NORVASC) 5 MG tablet Take 1 tablet (5 mg total) by mouth in the morning. 02/05/21   GMinna Merritts MD  atorvastatin (LIPITOR) 20 MG tablet Take 1 tablet (20 mg total) by mouth in the morning. For cholesterol 02/05/21   GMinna Merritts MD  Cholecalciferol 25 MCG (1000 UT) tablet Take by mouth. Pt takes 3 daily    [provider]  ezetimibe (ZETIA) 10 MG tablet Take 1 tablet (10 mg total) by mouth in the morning. For cholesterol 02/05/21   Dylanie Quesenberry, TKathlene November MD  Iron-Vitamin C 65-125 MG TABS Take 1 tablet by mouth daily. 01/09/21   YEarlie Server MD  losartan (COZAAR) 100 MG tablet Take 1 tablet (100 mg total) by mouth in the morning. 02/05/21   GMinna Merritts MD  magnesium oxide (MAG-OX) 400 MG tablet Take by mouth.  [provider]  metFORMIN (GLUMETZA) 1000 MG (MOD) 24 hr tablet Take 1,000 mg by mouth 2 (two) times daily with a meal.      [provider]  metoprolol succinate (TOPROL-XL) 50 MG 24 hr tablet Take 1 tablet (50 mg total) by mouth daily. 02/05/21 09/20/21  Minna Merritts, MD  mirabegron ER (MYRBETRIQ) 50 MG TB24 tablet Take by mouth.    [provider]  montelukast (SINGULAIR) 10 MG tablet Take 10 mg by mouth at bedtime.      [provider]  nitroGLYCERIN (NITRODUR - DOSED  IN MG/24 HR) 0.2 mg/hr patch PLEASE SEE ATTACHED FOR DETAILED DIRECTIONS 01/22/21   [provider]  omega-3 acid ethyl esters (LOVAZA) 1 g capsule Take 2 g by mouth 2 (two) times daily.      [provider]  omeprazole (PRILOSEC) 40 MG capsule Take 40 mg by mouth daily.      [provider]  pregabalin (LYRICA) 75 MG capsule Take 75 mg by mouth 2 (two) times daily. 01/13/21   [provider]  Rivaroxaban (XARELTO) 15 MG TABS tablet Take 1 tablet (15 mg total) by mouth daily. 02/05/21   Minna Merritts, MD  sertraline (ZOLOFT) 50 MG tablet Take 1 tablet by mouth daily. Pt taking 1/2 daily 12/05/20   [provider]  traZODone (DESYREL) 50 MG tablet Take 1 tablet by mouth at bedtime. 01/22/21 01/22/22  [provider]  TRULICITY 1.5 ZO/1.0RU SOPN Inject into the skin once a week. 12/16/20   [provider]    Inpatient Medications: Scheduled Meds:  amiodarone  200 mg Oral BID   atorvastatin  20 mg Oral q AM   insulin aspart  0-15 Units Subcutaneous TID WC   insulin aspart  0-5 Units Subcutaneous QHS   Rivaroxaban  15 mg Oral Q breakfast   Continuous Infusions:  PRN Meds: acetaminophen, ondansetron (ZOFRAN) IV  Allergies:    Allergies  Allergen Reactions   Levofloxacin Nausea Only    Patient reports that she thinks that she could take this medication.    Niacin Other (See Comments)    Hot, Flushed   Niacin And Related Other (See Comments)    Pt states she get real red and hot.    Social History:   Social History   Socioeconomic History   Marital status: Single    Spouse name: Not on file   Number of children: Not on file   Years of education: Not on file   Highest education level: Not on file  Occupational History   Not on file  Tobacco Use   Smoking status: Never   Smokeless tobacco: Never  Substance and Sexual Activity   Alcohol use: No   Drug use: No   Sexual activity: Not on file  Other Topics Concern    Not on file  Social History Narrative   Not on file   Social Determinants of Health   Financial Resource Strain: Not on file  Food Insecurity: Not on file  Transportation Needs: Not on file  Physical Activity: Not on file  Stress: Not on file  Social Connections: Not on file  Intimate Partner Violence: Not on file    Family History:    Family History  Problem Relation Age of Onset   Cancer Mother    Breast cancer Mother    Heart disease Father    Heart attack Father    Cancer Sister    Heart disease Sister  Cervical cancer Sister    Heart disease Brother      ROS:  Please see the history of present illness.  Review of Systems  Constitutional: Negative.   HENT: Negative.    Respiratory:  Positive for shortness of breath.   Cardiovascular:  Positive for chest pain.  Gastrointestinal: Negative.   Musculoskeletal: Negative.   Neurological: Negative.   Psychiatric/Behavioral: Negative.    All other systems reviewed and are negative.   Physical Exam/Data:   Vitals:   12/01/21 0600 12/01/21 0630 12/01/21 0656 12/01/21 0930  BP: (!) 118/58 130/71  (!) 116/48  Pulse: 71 71  78  Resp: (!) '21 15  19  '$ Temp:   98.9 F (37.2 C)   TempSrc:   Oral   SpO2: 96% 98%  96%  Weight:      Height:       No intake or output data in the 24 hours ending 12/01/21 0948    11/30/2021    5:56 PM 09/20/2021    1:42 PM 04/12/2021   11:52 AM  Last 3 Weights  Weight (lbs) 172 lb 172 lb 8 oz 173 lb 4.8 oz  Weight (kg) 78.019 kg 78.245 kg 78.608 kg     Body mass index is 31.46 kg/m.  General:  Well nourished, well developed, in no acute distress HEENT: normal Neck: no JVD Vascular: No carotid bruits; Distal pulses 2+ bilaterally Cardiac:  normal S1, S2; RRR; no murmur  Lungs:  clear to auscultation bilaterally, no wheezing, rhonchi or rales  Abd: soft, nontender, no hepatomegaly  Ext: no edema Musculoskeletal:  No deformities, BUE and BLE strength normal and equal Skin: warm  and dry  Neuro:  CNs 2-12 intact, no focal abnormalities noted Psych:  Normal affect   EKG:  The EKG was personally reviewed and demonstrates:   Atrial fibrillation with RVR rate 160 bpm Follow-up EKG 9:53 PM sinus rhythm rate 85 bpm left bundle branch block  Telemetry:  Telemetry was personally reviewed and demonstrates:   Normal sinus rhythm since 9 PM  Relevant CV Studies: Echocardiogram pending  Laboratory Data: Interventricular conduction delay  High Sensitivity Troponin:   Recent Labs  Lab 11/30/21 1805 11/30/21 2030  TROPONINIHS 11 13     Chemistry Recent Labs  Lab 11/30/21 1805 12/01/21 0632  NA 139 140  K 3.7 4.7  CL 108 112*  CO2 21* 22  GLUCOSE 152* 147*  BUN 16 19  CREATININE 1.26* 1.32*  CALCIUM 8.4* 7.8*  GFRNONAA 42* 39*  ANIONGAP 10 6    No results for input(s): "PROT", "ALBUMIN", "AST", "ALT", "ALKPHOS", "BILITOT" in the last 168 hours. Lipids  Recent Labs  Lab 12/01/21 0632  CHOL 118  TRIG 157*  HDL 38*  LDLCALC 49  CHOLHDL 3.1    Hematology Recent Labs  Lab 11/30/21 1805 12/01/21 0632  WBC 3.7* 5.8  RBC 3.82* 3.10*  HGB 12.0 9.7*  HCT 36.6 30.2*  MCV 95.8 97.4  MCH 31.4 31.3  MCHC 32.8 32.1  RDW 14.8 15.4  PLT 208 154   Thyroid  Recent Labs  Lab 11/30/21 2030  TSH 1.639  FREET4 0.84    BNPNo results for input(s): "BNP", "PROBNP" in the last 168 hours.  DDimer No results for input(s): "DDIMER" in the last 168 hours.   Radiology/Studies:  CT HEAD WO CONTRAST (5MM)  Result Date: 11/30/2021 CLINICAL DATA:  Head trauma. EXAM: CT HEAD WITHOUT CONTRAST TECHNIQUE: Contiguous axial images were obtained from the base of the  skull through the vertex without intravenous contrast. RADIATION DOSE REDUCTION: This exam was performed according to the departmental dose-optimization program which includes automated exposure control, adjustment of the mA and/or kV according to patient size and/or use of iterative reconstruction technique.  COMPARISON:  Head CT dated 06/03/2021. FINDINGS: Brain: Mild age-related atrophy and chronic microvascular ischemic changes. There is no acute intracranial hemorrhage. No mass effect or midline shift. No extra-axial fluid collection. Vascular: No hyperdense vessel or unexpected calcification. Skull: Normal. Negative for fracture or focal lesion. Sinuses/Orbits: No acute finding. Other: None IMPRESSION: 1. No acute intracranial pathology. 2. Mild age-related atrophy and chronic microvascular ischemic changes. Electronically Signed   By: Anner Crete M.D.   On: 11/30/2021 20:19   DG Chest 2 View  Result Date: 11/30/2021 CLINICAL DATA:  Chest pain EXAM: CHEST - 2 VIEW COMPARISON:  06/30/2013 FINDINGS: The heart size and mediastinal contours are within normal limits. Both lungs are clear. The visualized skeletal structures are unremarkable. IMPRESSION: No active cardiopulmonary disease. Electronically Signed   By: Rolm Baptise M.D.   On: 11/30/2021 18:53     Assessment and Plan:   Atrial fibrillation with RVR Prior history dating back to 2016, on Xarelto 20 daily Hypertension on diltiazem bolus with infusion in the ER Changed to amiodarone and converted to normal sinus rhythm around 9 PM Maintaining normal sinus rhythm overnight We will start amiodarone 200 twice daily for 2 weeks then down to 200 daily Continue metoprolol succinate 50 daily Continue Xarelto Echocardiogram pending.  If no acute findings, potentially could be discharged with follow-up in the office  2.  Essential hypertension She was given a dose of midodrine and IV fluid bolus by cross cover for low blood pressure overnight Would continue metoprolol succinate 50 daily Notes also indicate she takes losartan 100 mg daily and amlodipine 5 daily Low blood pressure through her stay in the emergency room likely from diltiazem, atrial fibrillation with RVR.  Mild improvement this morning  3.  Coronary artery disease with stable  angina Normal troponin in the setting of A-fib with RVR Currently with no symptoms of angina. No further workup at this time. Continue current medication regimen.  4.  Anemia Will need close outpatient follow-up, On Xarelto  5.  Chronic renal insufficiency In the setting of diabetes On losartan    Total encounter time more than 80 minutes  Greater than 50% was spent in counseling and coordination of care with the patient   For questions or updates, please contact [place] Please consult [email or web address] for contact info under    Signed, Ida Rogue, MD  12/01/2021 9:48 AM

## 2021-12-01 NOTE — Discharge Summary (Signed)
Sarah Potts KZS:[id number] DOB: Nov 18, 1932 DOA: 11/30/2021  PCP: Maryland Pink, MD  Admit date: 11/30/2021 Discharge date: 12/01/2021  Time spent: 35 minutes  Recommendations for Outpatient Follow-up:  Pcp and cardiology f/u    Discharge Diagnoses:  Principal Problem:   Atrial fibrillation with rapid ventricular response ([place]) Active Problems:   CAD (coronary artery disease)   HTN (hypertension)   Diabetes mellitus ([place])   Hyperlipidemia   Obesity   Anemia in stage 3a chronic kidney disease ([place])   Discharge Condition: stable  Diet recommendation: heart healthy  Filed Weights   11/30/21 1756  Weight: 78 kg    History of present illness:  Sarah Potts is a 88 y.o. 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  Patient has had controlled heart rate apparently with metoprolol.  But today she was doing fine apparently went to the grocery store with her son she came back later and started feeling bad.  She started shaking while in bed.  She was brought to the ER where she was found to be in A-fib with RVR.  Patient reported nausea with headache.  She apparently had a fall 2 days ago at that time she thought she hit her head but cannot recall what happened.  In the ER she was initiated on Cardizem but blood pressure has been dropping.  Patient was switched to IV amiodarone.  At this point she remained mildly hypotensive.  She has spontaneously converted to sinus rhythm at the moment.  Patient was reporting chest pain at some point.  She is however hemodynamically stable except for mild hypotension.  Hospital Course:  Patient presented with a-fib with rvr. Hemodynamically stable. Hypotensive with diltiazem so instead given amiodarone and spontaneously converted. Since then bps soft  but hemodynamically stable, feeling well. Ambulated without difficulty. Cardiology consulted. TTE obtained, no significant abnormalities seen. Cardiology advising discharge with amiodarone, will plan on pcp and cardiology f/u. Given soft pressures and report that BPs are also relatively low at home, will hold home amlodipine and losartan pending pcp or cardiology f/u. Given GFR less than 45 advised decreasing metformin to 1000 daily. Given CAD consider initiation or substitutation of an sglt2 inhibitor in the future.   Procedures: none   Consultations: cardiology  Discharge Exam: Vitals:   12/01/21 1346 12/01/21 1535  BP:    Pulse: 73 82  Resp: 14 16  Temp:  98.1 F (36.7 C)  SpO2: 97% 98%    General: NAD Cardiovascular: RRR Respiratory: CTAB  Discharge Instructions   Discharge Instructions     Diet - low sodium heart healthy   Complete by: As directed    Increase activity slowly   Complete by: As directed       Allergies as of 12/01/2021       Reactions   Levofloxacin Nausea Only   Patient reports that she thinks that she could take this medication.    Niacin Other (See Comments)   Hot, Flushed   Niacin And Related Other (See Comments)   Pt states she get real red and hot.        Medication List     STOP taking these medications    ALPRAZolam 0.5 MG tablet Commonly known as: XANAX   amLODipine 5 MG tablet Commonly known as: NORVASC   losartan 100 MG tablet Commonly  known as: COZAAR   sertraline 50 MG tablet Commonly known as: ZOLOFT       TAKE these medications    amiodarone 200 MG tablet Commonly known as: PACERONE 1 tab by mouth twice a day for two weeks, then 1 tab by mouth daily   atorvastatin 20 MG tablet Commonly known as: LIPITOR Take 1 tablet (20 mg total) by mouth in the morning. For cholesterol   Cholecalciferol 25 MCG (1000 UT) tablet Take by mouth. Pt takes 3 daily   ezetimibe 10 MG tablet Commonly known as: ZETIA Take 1  tablet (10 mg total) by mouth in the morning. For cholesterol   Iron-Vitamin C 65-125 MG Tabs Take 1 tablet by mouth daily.   magnesium oxide 400 MG tablet Commonly known as: MAG-OX Take by mouth.   meclizine 25 MG tablet Commonly known as: ANTIVERT Take 25 mg by mouth 3 (three) times daily as needed.   metFORMIN 1000 MG (MOD) 24 hr tablet Commonly known as: GLUMETZA Take 1 tablet (1,000 mg total) by mouth daily with breakfast. What changed: when to take this   metoprolol succinate 50 MG 24 hr tablet Commonly known as: TOPROL-XL Take 1 tablet (50 mg total) by mouth daily.   mirabegron ER 50 MG Tb24 tablet Commonly known as: MYRBETRIQ Take by mouth.   montelukast 10 MG tablet Commonly known as: SINGULAIR Take 10 mg by mouth at bedtime.   nitroGLYCERIN 0.2 mg/hr patch Commonly known as: NITRODUR - Dosed in mg/24 hr PLEASE SEE ATTACHED FOR DETAILED DIRECTIONS   omega-3 acid ethyl esters 1 g capsule Commonly known as: LOVAZA Take 2 g by mouth 2 (two) times daily.   omeprazole 40 MG capsule Commonly known as: PRILOSEC Take 40 mg by mouth daily.   pregabalin 75 MG capsule Commonly known as: LYRICA Take 75 mg by mouth 2 (two) times daily.   Rivaroxaban 15 MG Tabs tablet Commonly known as: XARELTO Take 1 tablet (15 mg total) by mouth daily.   traZODone 50 MG tablet Commonly known as: DESYREL Take 1 tablet by mouth at bedtime.   Trulicity 1.5 QQ/7.6PP Sopn Generic drug: Dulaglutide Inject into the skin once a week.       Allergies  Allergen Reactions   Levofloxacin Nausea Only    Patient reports that she thinks that she could take this medication.    Niacin Other (See Comments)    Hot, Flushed   Niacin And Related Other (See Comments)    Pt states she get real red and hot.    Follow-up Information     Maryland Pink, MD Follow up.   Specialty: Family Medicine Contact information: [place] [id number] [phone]         Minna Merritts, MD .   Specialty: Cardiology Contact information: [place]  [phone]                  The results of significant diagnostics from this hospitalization (including imaging, microbiology, ancillary and laboratory) are listed below for reference.    Significant Diagnostic Studies: ECHOCARDIOGRAM COMPLETE  Result Date: 12/01/2021    ECHOCARDIOGRAM REPORT   Patient Name:   Sarah Potts Date of Exam: 12/01/2021 Medical Rec #:  [id number]      Height:       62.0 in Accession #:    [id number]     Weight:       172.0 lb Date of Birth:  1932/07/10      BSA:          1.793 m Patient Age:    21 years       BP:           113/52 mmHg Patient Gender: F              HR:           78 bpm. Exam Location:  [facility] Procedure: 2D Echo and Intracardiac Opacification Agent Indications:     Atrial Fibrillation I48.91  History:         Patient has no prior history of Echocardiogram examinations.  Sonographer:     Kathlen Brunswick RDCS Referring Phys:  [place] FIEP Diagnosing Phys: Ida Rogue MD  Sonographer Comments: Technically difficult study due to poor echo windows and suboptimal apical window. Image acquisition challenging due to patient body habitus. IMPRESSIONS  1. Left ventricular ejection fraction, by estimation, is 55 to 60%. The left ventricle has normal function. The left ventricle has no regional wall motion abnormalities. Left ventricular diastolic parameters are consistent with Grade I diastolic dysfunction (impaired relaxation).  2. Right ventricular systolic function is normal. The right ventricular size is normal.  3. The mitral valve is normal in structure. No evidence of mitral valve regurgitation. No evidence of mitral stenosis. Moderate mitral annular calcification.  4. The aortic valve is normal in structure. Aortic valve regurgitation is not visualized. Aortic valve sclerosis is present, with no evidence of aortic valve  stenosis.  5. The inferior vena cava is normal in size with greater than 50% respiratory variability, suggesting right atrial pressure of 3 mmHg. FINDINGS  Left Ventricle: Left ventricular ejection fraction, by estimation, is 55 to 60%. The left ventricle has normal function. The left ventricle has no regional wall motion abnormalities. Definity contrast agent was given IV to delineate the left ventricular  endocardial borders. The left ventricular internal cavity size was normal in size. There is no left ventricular hypertrophy. Left ventricular diastolic parameters are consistent with Grade I diastolic dysfunction (impaired relaxation). Right Ventricle: The right ventricular size is normal. No increase in right ventricular wall thickness. Right ventricular systolic function is normal. Left Atrium: Left atrial size was normal in size. Right Atrium: Right atrial size was normal in size. Pericardium: There is no evidence of pericardial effusion. Mitral Valve: The mitral valve is normal in structure. Moderate mitral annular calcification. No evidence of mitral valve regurgitation. No evidence of mitral valve stenosis. MV peak gradient, 5.0 mmHg. The mean mitral valve gradient is 2.0 mmHg. Tricuspid Valve: The tricuspid valve is normal in structure. Tricuspid valve regurgitation is mild . No evidence of tricuspid stenosis. Aortic Valve: The aortic valve is normal in structure. Aortic valve regurgitation is not visualized. Aortic valve sclerosis is present, with no evidence of aortic valve stenosis. Aortic valve peak gradient measures 9.7 mmHg. Pulmonic Valve: The pulmonic valve was normal in structure. Pulmonic valve regurgitation is not visualized. No evidence of pulmonic stenosis. Aorta: The aortic root is normal in size and structure. Venous: The inferior vena cava is normal in size with greater than 50% respiratory variability, suggesting right atrial pressure of 3 mmHg. IAS/Shunts: No atrial level shunt detected  by color flow Doppler.  LEFT VENTRICLE PLAX 2D LVIDd:         4.39 cm     Diastology LVIDs:         3.05 cm     LV e' medial:  6.20 cm/s LV PW:         1.31 cm     LV E/e' medial:  17.6 LV IVS:        1.01 cm     LV e' lateral:   5.55 cm/s LVOT diam:     1.90 cm     LV E/e' lateral: 19.6 LV SV:         50 LV SV Index:   28 LVOT Area:     2.84 cm  LV Volumes (MOD) LV vol d, MOD A2C: 67.7 ml LV vol d, MOD A4C: 53.4 ml LV vol s, MOD A2C: 21.1 ml LV vol s, MOD A4C: 19.1 ml LV SV MOD A2C:     46.6 ml LV SV MOD A4C:     53.4 ml LV SV MOD BP:      40.3 ml RIGHT VENTRICLE RV Basal diam:  3.38 cm RV S prime:     14.10 cm/s TAPSE (M-mode): 2.1 cm LEFT ATRIUM             Index        RIGHT ATRIUM           Index LA diam:        4.50 cm 2.51 cm/m   RA Area:     14.40 cm LA Vol (A2C):   45.8 ml 25.54 ml/m  RA Volume:   41.30 ml  23.03 ml/m LA Vol (A4C):   64.4 ml 35.92 ml/m LA Biplane Vol: 58.9 ml 32.85 ml/m  AORTIC VALVE                 PULMONIC VALVE AV Area (Vmax): 1.41 cm     PV Vmax:       0.87 m/s AV Vmax:        156.00 cm/s  PV Peak grad:  3.0 mmHg AV Peak Grad:   9.7 mmHg LVOT Vmax:      77.70 cm/s LVOT Vmean:     56.700 cm/s LVOT VTI:       0.177 m  AORTA Ao Root diam: 2.80 cm Ao Asc diam:  2.60 cm MITRAL VALVE                TRICUSPID VALVE MV Area (PHT): 3.87 cm     TV Peak grad:   25.7 mmHg MV Area VTI:   1.52 cm     TV Vmax:        2.54 m/s MV Peak grad:  5.0 mmHg MV Mean grad:  2.0 mmHg     SHUNTS MV Vmax:       1.12 m/s     Systemic VTI:  0.18 m MV Vmean:      70.0 cm/s    Systemic Diam: 1.90 cm MV Decel Time: 196 msec MV E velocity: 109.00 cm/s MV A velocity: 93.40 cm/s MV E/A ratio:  1.17 Ida Rogue MD Electronically signed by Ida Rogue MD Signature Date/Time: 12/01/2021/3:25:10 PM    Final    CT HEAD WO CONTRAST (5MM)  Result Date: 11/30/2021 CLINICAL DATA:  Head trauma. EXAM: CT HEAD WITHOUT CONTRAST TECHNIQUE: Contiguous axial images were obtained from the base of the skull through the  vertex without intravenous contrast. RADIATION DOSE REDUCTION: This exam was performed according to the departmental dose-optimization program which includes automated exposure control, adjustment of the mA and/or kV according to patient size and/or use of iterative reconstruction technique. COMPARISON:  Head CT dated 06/03/2021. FINDINGS: Brain: Mild age-related atrophy  and chronic microvascular ischemic changes. There is no acute intracranial hemorrhage. No mass effect or midline shift. No extra-axial fluid collection. Vascular: No hyperdense vessel or unexpected calcification. Skull: Normal. Negative for fracture or focal lesion. Sinuses/Orbits: No acute finding. Other: None IMPRESSION: 1. No acute intracranial pathology. 2. Mild age-related atrophy and chronic microvascular ischemic changes. Electronically Signed   By: Anner Crete M.D.   On: 11/30/2021 20:19   DG Chest 2 View  Result Date: 11/30/2021 CLINICAL DATA:  Chest pain EXAM: CHEST - 2 VIEW COMPARISON:  06/30/2013 FINDINGS: The heart size and mediastinal contours are within normal limits. Both lungs are clear. The visualized skeletal structures are unremarkable. IMPRESSION: No active cardiopulmonary disease. Electronically Signed   By: Rolm Baptise M.D.   On: 11/30/2021 18:53    Microbiology: Recent Results (from the past 240 hour(s))  SARS Coronavirus 2 by RT PCR (hospital order, performed in [facility] hospital lab) *cepheid single result test* Anterior Nasal Swab     Status: None   Collection Time: 11/30/21  7:15 PM   Specimen: Anterior Nasal Swab  Result Value Ref Range Status   SARS Coronavirus 2 by RT PCR NEGATIVE NEGATIVE Final    Comment: (NOTE) SARS-CoV-2 target nucleic acids are NOT DETECTED.  The SARS-CoV-2 RNA is generally detectable in upper and lower respiratory specimens during the acute phase of infection. The lowest concentration of SARS-CoV-2 viral copies this assay can detect is 250 copies / mL. A negative  result does not preclude SARS-CoV-2 infection and should not be used as the sole basis for treatment or other patient management decisions.  A negative result may occur with improper specimen collection / handling, submission of specimen other than nasopharyngeal swab, presence of viral mutation(s) within the areas targeted by this assay, and inadequate number of viral copies (<250 copies / mL). A negative result must be combined with clinical observations, patient history, and epidemiological information.  Fact Sheet for Patients:   https://www.patel.info/  Fact Sheet for Healthcare Providers: https://hall.com/  This test is not yet approved or  cleared by the [place] FDA and has been authorized for detection and/or diagnosis of SARS-CoV-2 by FDA under an Emergency Use Authorization (EUA).  This EUA will remain in effect (meaning this test can be used) for the duration of the COVID-19 declaration under Section 564(b)(1) of the Act, 21 U.S.C. section 360bbb-3(b)(1), unless the authorization is terminated or revoked sooner.  Performed at [facility], [address] [place], [place] [address]      Labs: Basic Metabolic Panel: Recent Labs  Lab 11/30/21 1805 12/01/21 0632  NA 139 140  K 3.7 4.7  CL 108 112*  CO2 21* 22  GLUCOSE 152* 147*  BUN 16 19  CREATININE 1.26* 1.32*  CALCIUM 8.4* 7.8*   Liver Function Tests: No results for input(s): "AST", "ALT", "ALKPHOS", "BILITOT", "PROT", "ALBUMIN" in the last 168 hours. No results for input(s): "LIPASE", "AMYLASE" in the last 168 hours. No results for input(s): "AMMONIA" in the last 168 hours. CBC: Recent Labs  Lab 11/30/21 1805 12/01/21 0632  WBC 3.7* 5.8  HGB 12.0 9.7*  HCT 36.6 30.2*  MCV 95.8 97.4  PLT 208 154   Cardiac Enzymes: No results for input(s): "CKTOTAL", "CKMB", "CKMBINDEX", "TROPONINI" in the last 168 hours. BNP: BNP (last 3 results) No  results for input(s): "BNP" in the last 8760 hours.  ProBNP (last 3 results) No results for input(s): "PROBNP" in the last 8760 hours.  CBG: Recent Labs  Lab 12/01/21 0718 12/01/21 1113  GLUCAP 141* 147*       Signed:  Desma Maxim MD.  [facility] 12/01/2021, 3:53 PM

## 2021-12-01 NOTE — Care Management CC44 (Signed)
Condition Code 44 Documentation Completed  Patient Details  Name: Sarah Potts MRN: [id number] Date of Birth: 23-Apr-1932   Condition Code 44 given:  Yes Patient signature on Condition Code 44 notice:  Yes Documentation of 2 MD's agreement:  Yes Code 44 added to claim:  Yes  Reviewed with patient and son at bedside. Patient verbalized understanding. Copy provided to patient for patient's records.   [place], LCSW 12/01/2021, 4:13 PM

## 2021-12-02 ENCOUNTER — Other Ambulatory Visit: Payer: Self-pay | Admitting: Cardiovascular Disease

## 2021-12-03 ENCOUNTER — Ambulatory Visit (INDEPENDENT_AMBULATORY_CARE_PROVIDER_SITE_OTHER): Payer: PPO | Admitting: Cardiology

## 2021-12-03 ENCOUNTER — Encounter: Payer: Self-pay | Admitting: Cardiology

## 2021-12-03 VITALS — BP 94/56 | HR 79 | Ht 62.0 in | Wt 175.0 lb

## 2021-12-03 DIAGNOSIS — I25118 Atherosclerotic heart disease of native coronary artery with other forms of angina pectoris: Secondary | ICD-10-CM

## 2021-12-03 DIAGNOSIS — N1831 Chronic kidney disease, stage 3a: Secondary | ICD-10-CM

## 2021-12-03 DIAGNOSIS — I10 Essential (primary) hypertension: Secondary | ICD-10-CM

## 2021-12-03 DIAGNOSIS — E782 Mixed hyperlipidemia: Secondary | ICD-10-CM

## 2021-12-03 DIAGNOSIS — I4891 Unspecified atrial fibrillation: Secondary | ICD-10-CM

## 2021-12-03 DIAGNOSIS — E1159 Type 2 diabetes mellitus with other circulatory complications: Secondary | ICD-10-CM | POA: Diagnosis not present

## 2021-12-03 NOTE — Patient Instructions (Signed)
Medication Instructions:   Your physician recommends that you continue on your current medications as directed. Please refer to the Current Medication list given to you today.  *If you need a refill on your cardiac medications before your next appointment, please call your pharmacy*   Lab Work:  None ordered  Testing/Procedures:  None ordered   Follow-Up: At [facility], you and your health needs are our priority.  As part of our continuing mission to provide you with exceptional heart care, we have created designated Provider Care Teams.  These Care Teams include your primary Cardiologist (physician) and Advanced Practice Providers (APPs -  Physician Assistants and Nurse Practitioners) who all work together to provide you with the care you need, when you need it.  We recommend signing up for the patient portal called "MyChart".  Sign up information is provided on this After Visit Summary.  MyChart is used to connect with patients for Virtual Visits (Telemedicine).  Patients are able to view lab/test results, encounter notes, upcoming appointments, etc.  Non-urgent messages can be sent to your provider as well.   To learn more about what you can do with MyChart, go to [email or web address].    Your next appointment:   1 month(s) w/ EKG  The format for your next appointment:   In Person  Provider:   You may see Ida Rogue, MD or one of the following Advanced Practice Providers on your designated Care Team:   Murray Hodgkins, NP Christell Faith, PA-C Cadence Kathlen Mody, [place]    Important Information About Sugar

## 2021-12-03 NOTE — Progress Notes (Signed)
Cardiology Clinic Note   Patient Name: Sarah Potts Date of Encounter: 12/03/2021  Primary Care Provider:  [place] Pink, MD Primary Cardiologist:  Ida Rogue, MD  Patient Profile    88 year old female with significant past medical history of paroxysmal atrial fibrillation, diabetes, hyperlipidemia, essential hypertension, coronary artery disease status post PCI, history of colostomy in the past, who is here today for hospital follow-up of A-fib RVR.  Past Medical History    Past Medical History:  Diagnosis Date   Anal cancer ([place])    Diabetes mellitus    Hyperlipidemia    Hypertension    Past Surgical History:  Procedure Laterality Date   BLADDER SURGERY     CARDIAC CATHETERIZATION     COLOSTOMY     CORONARY ANGIOPLASTY WITH STENT PLACEMENT  03/25/2018   Stent to the LCX  2.50 mm x 91m  [employer] [id number]  heart stent     TONSILLECTOMY     uterus surgery     removed    Allergies  Allergies  Allergen Reactions   Levofloxacin Nausea Only    Patient reports that she thinks that she could take this medication.    Niacin Other (See Comments)    Hot, Flushed   Niacin And Related Other (See Comments)    Pt states she get real red and hot.    History of Present Illness    88year old female with the above-mentioned past medical history significant for paroxysmal atrial fibrillation, diabetes, hyperlipidemia, essential hypertension, coronary artery disease status post PCI with DES, history of colonoscopy in the past who [facility] and admitted to the hospital for atrial fibrillation with RVR.  Patient was brought in from home secondary to atrial fibrillation with RVR previously heart rate had been controlled with metoprolol.  She had stated that she had been doing well and went to the grocery store with her son and came back and started feeling bad.  Son brought  her into the emergency department she was found to be A-fib RVR she had reported nausea with headache she apparently had fallen 2 days prior and at that time thought she had hit her head but cannot recall what happened.  In the emergency department she was initiated on Cardizem drip but became hypotensive and was switched to IV amiodarone.  At that point she remained mildly hypotensive. She had spontaneously converted to sinus rhythm after being on amiodarone but continued with mild hypotension.  Her losartan, amlodipine, and Nitropatch were all placed on hold and she was given a dose of midodrine to help elevate her blood pressure along with IV fluids.  Undergo echocardiogram while she was hospitalized which revealed an LVEF of 55 to 60%, no regional wall motion abnormalities, grade 1 diastolic dysfunction with impaired relaxation.  No evidence of MR but moderate mitral annular calcification.  She was subsequently discharged from the hospital the next day on 12/01/2021 and was advised to continue with amiodarone 200 mg twice daily for 2 weeks and then changed to 200 mg daily continue with metoprolol but to continue to hold all of her other blood pressure medications including the [place]  So she returns to clinic today for hospital follow-up with the only complaint that she is having today is fatigue.  She states that since she was in the hospital she was extremely tired.  She  is remaining in sinus rhythm on EKG today.  Denies any chest pain, shortness of breath, or palpitations.  She occasionally has some dyspnea on exertion and some peripheral edema but that is unchanged from baseline.  Home Medications    Current Outpatient Medications  Medication Sig Dispense Refill   amiodarone (PACERONE) 200 MG tablet 1 tab by mouth twice a day for two weeks, then 1 tab by mouth daily 60 tablet 1   atorvastatin (LIPITOR) 20 MG tablet Take 1 tablet (20  mg total) by mouth in the morning. For cholesterol 90 tablet 3   Cholecalciferol 25 MCG (1000 UT) tablet Take by mouth. Pt takes 3 daily     ezetimibe (ZETIA) 10 MG tablet Take 1 tablet (10 mg total) by mouth in the morning. For cholesterol 90 tablet 3   Iron-Vitamin C 65-125 MG TABS Take 1 tablet by mouth daily. 30 tablet 3   magnesium oxide (MAG-OX) 400 MG tablet Take 400 mg by mouth daily.     meclizine (ANTIVERT) 25 MG tablet Take 25 mg by mouth 3 (three) times daily as needed.     metFORMIN (GLUMETZA) 1000 MG (MOD) 24 hr tablet Take 1 tablet (1,000 mg total) by mouth daily with breakfast.     metoprolol succinate (TOPROL-XL) 50 MG 24 hr tablet TAKE 1 TABLET BY MOUTH EVERY DAY 90 tablet 3   mirabegron ER (MYRBETRIQ) 50 MG TB24 tablet Take by mouth.     montelukast (SINGULAIR) 10 MG tablet Take 10 mg by mouth at bedtime.       nitroGLYCERIN (NITRODUR - DOSED IN MG/24 HR) 0.2 mg/hr patch      omega-3 acid ethyl esters (LOVAZA) 1 g capsule Take 2 g by mouth 2 (two) times daily.       omeprazole (PRILOSEC) 40 MG capsule Take 40 mg by mouth daily.       pregabalin (LYRICA) 75 MG capsule Take 75 mg by mouth 2 (two) times daily.     Rivaroxaban (XARELTO) 15 MG TABS tablet Take 1 tablet (15 mg total) by mouth daily. 90 tablet 3   traZODone (DESYREL) 50 MG tablet Take 1 tablet by mouth at bedtime.     TRULICITY 1.5 OE/4.2PN SOPN Inject into the skin once a week.     No current facility-administered medications for this visit.     Family History    Family History  Problem Relation Age of Onset   Cancer Mother    Breast cancer Mother    Heart disease Father    Heart attack Father    Cancer Sister    Heart disease Sister    Cervical cancer Sister    Heart disease Brother    She indicated that her mother is deceased. She indicated that her father is deceased. She indicated that her sister is deceased. She indicated that her brother is deceased. She indicated that her son is alive.  Social  History    Social History   Socioeconomic History   Marital status: Single    Spouse name: Not on file   Number of children: Not on file   Years of education: Not on file   Highest education level: Not on file  Occupational History   Not on file  Tobacco Use   Smoking status: Never   Smokeless tobacco: Never  Vaping Use   Vaping Use: Never used  Substance and Sexual Activity   Alcohol use: No   Drug use: No   Sexual activity: Not  on file  Other Topics Concern   Not on file  Social History Narrative   Not on file   Social Determinants of Health   Financial Resource Strain: Not on file  Food Insecurity: Not on file  Transportation Needs: Not on file  Physical Activity: Not on file  Stress: Not on file  Social Connections: Not on file  Intimate Partner Violence: Not on file     Review of Systems    General:  No chills, fever, night sweats or weight changes.  Endorses fatigue Cardiovascular:  No chest pain, endorses dyspnea on exertion, endorses occasional peripheral edema, orthopnea, palpitations, paroxysmal nocturnal dyspnea. Dermatological: No rash, lesions/masses Respiratory: No cough, or symptoms of exertional dyspnea Urologic: No hematuria, dysuria Abdominal:   No nausea, vomiting, diarrhea, bright red blood per rectum, melena, or hematemesis Neurologic:  No visual changes, wkns, changes in mental status. All other systems reviewed and are otherwise negative except as noted above.   Physical Exam    VS:  BP (!) 94/56 (BP Location: Left Arm, Patient Position: Sitting, Cuff Size: Large)   Pulse 79   Ht '5\' 2"'$  (1.575 m)   Wt 175 lb (79.4 kg)   SpO2 96%   BMI 32.01 kg/m  , BMI Body mass index is 32.01 kg/m.     GEN: Well nourished, well developed, in no acute distress.  Simply fatigued. HEENT: normal. Neck: Supple, no JVD, carotid bruits, or masses. Cardiac: RRR, no murmurs, rubs, or gallops. No clubbing, cyanosis, edema.  Radials/DP/PT 2+ and equal  bilaterally.  Respiratory:  Respirations regular and unlabored, clear to auscultation bilaterally. GI: Soft, nontender, nondistended, BS + x 4. MS: no deformity or atrophy. Skin: warm and dry, no rash. Neuro:  Strength and sensation are intact. Psych: Normal affect.  Accessory Clinical Findings    ECG personally reviewed by me today-sinus rhythm with a rate of 79, first-degree AV block, chronic left bundle branch block, left axis deviation, and a unifocal PVCs- No acute changes  Lab Results  Component Value Date   WBC 5.8 12/01/2021   HGB 9.7 (L) 12/01/2021   HCT 30.2 (L) 12/01/2021   MCV 97.4 12/01/2021   PLT 154 12/01/2021   Lab Results  Component Value Date   CREATININE 1.32 (H) 12/01/2021   BUN 19 12/01/2021   NA 140 12/01/2021   K 4.7 12/01/2021   CL 112 (H) 12/01/2021   CO2 22 12/01/2021   Lab Results  Component Value Date   ALT 17 10/09/2021   AST 23 10/09/2021   ALKPHOS 44 10/09/2021   BILITOT 0.8 10/09/2021   Lab Results  Component Value Date   CHOL 118 12/01/2021   HDL 38 (L) 12/01/2021   LDLCALC 49 12/01/2021   TRIG 157 (H) 12/01/2021   CHOLHDL 3.1 12/01/2021    Lab Results  Component Value Date   HGBA1C 7.2 (H) 11/30/2021    Assessment & Plan   1.  Paroxysmal atrial fibrillation with recent hospitalization for atrial fibrillation with RVR.  She is sinus today on EKG.  Atrial fibrillation history dates back to 2016.  She is continue on Xarelto 15 mg daily for CHA2DS2-VASc.... She was started on diltiazem drip in the emergency department had to be stopped due to hypotension and subsequently ended up on amiodarone for better rate control.  She did tolerate amiodarone was subsequently discharged on 200 mg twice daily for 2 weeks and then she will decrease to 2 mg daily.  2.  Essential hypertension now  in the setting of hypotension with a blood pressure today of 94/56.  On the hospital discharge with losartan, amlodipine, and nitro patches were placed on  hold.  In the hospital she was given a dose of midodrine and IV fluids for hypotension.  She has subsequently been continued on her metoprolol XL 50 mg daily and her antiarrhythmic of amiodarone but all of her other medications for hypertension have currently been placed on hold.  Patient is continue to monitor her pressures at home as she stated they have been running a little low at home as well.  3.  Coronary artery disease with stable angina who remains chest pain-free today.  Left circumflex stent placed in 2019.  Continues on Xarelto in place of aspirin.  She has no signs of angina.  Normal troponin in the setting of A-fib RVR with her recent hospitalization.  Nitro patches were placed on hold due to hypotension.  There was no further work-up that was needed during her hospitalization.  Recent echocardiogram revealed LVEF of 55 to 60%, no regional wall motion abnormalities, grade 1 diastolic dysfunction with impaired relaxation pattern, and moderate mitral annular calcification.  No further ischemic work-up needed at this time.  4.  Hyperlipidemia with LDL of 4908/20/23.  She is to continue on her atorvastatin and Zetia and Lovaza.   5.  Diabetes type 2 with the latest hemoglobin A1c of 7.2 on 11/30/2021.  Due to her increasing kidney function it was recommended that her metformin was decreased to daily from twice daily.  This is followed by her PCP.  6.  Chronic kidney disease stage IIIa with stable creatinine of 1.32 on 12/01/2021 with baseline around 1.20.  Avoid nephrotoxic agents when able.  This is followed by her PCP.  7.  Disposition patient return to clinic to see MD/APP in 1 month or sooner if needed with repeat EKG  Jahnyla Parrillo, NP 12/03/2021, 3:48 PM

## 2021-12-06 ENCOUNTER — Ambulatory Visit: Payer: PPO

## 2021-12-09 ENCOUNTER — Inpatient Hospital Stay: Payer: PPO | Attending: Oncology

## 2021-12-09 DIAGNOSIS — E538 Deficiency of other specified B group vitamins: Secondary | ICD-10-CM | POA: Insufficient documentation

## 2021-12-09 MED ORDER — CYANOCOBALAMIN 1000 MCG/ML IJ SOLN
1000.0000 ug | Freq: Once | INTRAMUSCULAR | Status: AC
  Administered 2021-12-09: 1000 ug via INTRAMUSCULAR
  Filled 2021-12-09: qty 1

## 2021-12-11 DIAGNOSIS — E785 Hyperlipidemia, unspecified: Secondary | ICD-10-CM | POA: Diagnosis not present

## 2021-12-11 DIAGNOSIS — E119 Type 2 diabetes mellitus without complications: Secondary | ICD-10-CM | POA: Diagnosis not present

## 2021-12-11 DIAGNOSIS — I10 Essential (primary) hypertension: Secondary | ICD-10-CM | POA: Diagnosis not present

## 2021-12-12 DIAGNOSIS — R8290 Unspecified abnormal findings in urine: Secondary | ICD-10-CM | POA: Diagnosis not present

## 2021-12-13 ENCOUNTER — Ambulatory Visit: Payer: PPO

## 2021-12-17 DIAGNOSIS — Z933 Colostomy status: Secondary | ICD-10-CM | POA: Diagnosis not present

## 2021-12-18 DIAGNOSIS — I10 Essential (primary) hypertension: Secondary | ICD-10-CM | POA: Diagnosis not present

## 2021-12-18 DIAGNOSIS — E1140 Type 2 diabetes mellitus with diabetic neuropathy, unspecified: Secondary | ICD-10-CM | POA: Diagnosis not present

## 2021-12-18 DIAGNOSIS — D509 Iron deficiency anemia, unspecified: Secondary | ICD-10-CM | POA: Diagnosis not present

## 2021-12-18 DIAGNOSIS — N1832 Chronic kidney disease, stage 3b: Secondary | ICD-10-CM | POA: Diagnosis not present

## 2021-12-18 DIAGNOSIS — G8929 Other chronic pain: Secondary | ICD-10-CM | POA: Diagnosis not present

## 2021-12-18 DIAGNOSIS — I25119 Atherosclerotic heart disease of native coronary artery with unspecified angina pectoris: Secondary | ICD-10-CM | POA: Diagnosis not present

## 2021-12-18 DIAGNOSIS — E785 Hyperlipidemia, unspecified: Secondary | ICD-10-CM | POA: Diagnosis not present

## 2021-12-18 DIAGNOSIS — M545 Low back pain, unspecified: Secondary | ICD-10-CM | POA: Diagnosis not present

## 2021-12-18 DIAGNOSIS — Z433 Encounter for attention to colostomy: Secondary | ICD-10-CM | POA: Diagnosis not present

## 2021-12-18 DIAGNOSIS — Z0000 Encounter for general adult medical examination without abnormal findings: Secondary | ICD-10-CM | POA: Diagnosis not present

## 2021-12-18 DIAGNOSIS — G609 Hereditary and idiopathic neuropathy, unspecified: Secondary | ICD-10-CM | POA: Diagnosis not present

## 2021-12-18 DIAGNOSIS — M65341 Trigger finger, right ring finger: Secondary | ICD-10-CM | POA: Diagnosis not present

## 2021-12-20 ENCOUNTER — Emergency Department: Payer: PPO

## 2021-12-20 ENCOUNTER — Inpatient Hospital Stay
Admission: EM | Admit: 2021-12-20 | Discharge: 2021-12-24 | DRG: 287 | Disposition: A | Discharge status: Home / Self Care | Payer: PPO | Attending: Internal Medicine | Admitting: Internal Medicine

## 2021-12-20 ENCOUNTER — Telehealth: Payer: Self-pay | Admitting: Cardiology

## 2021-12-20 ENCOUNTER — Other Ambulatory Visit: Payer: Self-pay

## 2021-12-20 DIAGNOSIS — N1831 Chronic kidney disease, stage 3a: Secondary | ICD-10-CM | POA: Diagnosis present

## 2021-12-20 DIAGNOSIS — Z6832 Body mass index (BMI) 32.0-32.9, adult: Secondary | ICD-10-CM

## 2021-12-20 DIAGNOSIS — I130 Hypertensive heart and chronic kidney disease with heart failure and stage 1 through stage 4 chronic kidney disease, or unspecified chronic kidney disease: Secondary | ICD-10-CM | POA: Diagnosis not present

## 2021-12-20 DIAGNOSIS — Z7984 Long term (current) use of oral hypoglycemic drugs: Secondary | ICD-10-CM

## 2021-12-20 DIAGNOSIS — Z7901 Long term (current) use of anticoagulants: Secondary | ICD-10-CM

## 2021-12-20 DIAGNOSIS — Z803 Family history of malignant neoplasm of breast: Secondary | ICD-10-CM

## 2021-12-20 DIAGNOSIS — I10 Essential (primary) hypertension: Secondary | ICD-10-CM | POA: Diagnosis not present

## 2021-12-20 DIAGNOSIS — Z7985 Long-term (current) use of injectable non-insulin antidiabetic drugs: Secondary | ICD-10-CM

## 2021-12-20 DIAGNOSIS — Z933 Colostomy status: Secondary | ICD-10-CM

## 2021-12-20 DIAGNOSIS — E1122 Type 2 diabetes mellitus with diabetic chronic kidney disease: Secondary | ICD-10-CM | POA: Diagnosis present

## 2021-12-20 DIAGNOSIS — Z955 Presence of coronary angioplasty implant and graft: Secondary | ICD-10-CM | POA: Diagnosis not present

## 2021-12-20 DIAGNOSIS — Z888 Allergy status to other drugs, medicaments and biological substances status: Secondary | ICD-10-CM

## 2021-12-20 DIAGNOSIS — E782 Mixed hyperlipidemia: Secondary | ICD-10-CM | POA: Diagnosis not present

## 2021-12-20 DIAGNOSIS — I440 Atrioventricular block, first degree: Secondary | ICD-10-CM | POA: Diagnosis not present

## 2021-12-20 DIAGNOSIS — Z881 Allergy status to other antibiotic agents status: Secondary | ICD-10-CM

## 2021-12-20 DIAGNOSIS — I252 Old myocardial infarction: Secondary | ICD-10-CM | POA: Diagnosis not present

## 2021-12-20 DIAGNOSIS — E1129 Type 2 diabetes mellitus with other diabetic kidney complication: Secondary | ICD-10-CM | POA: Diagnosis present

## 2021-12-20 DIAGNOSIS — B9620 Unspecified Escherichia coli [E. coli] as the cause of diseases classified elsewhere: Secondary | ICD-10-CM | POA: Diagnosis present

## 2021-12-20 DIAGNOSIS — E785 Hyperlipidemia, unspecified: Secondary | ICD-10-CM | POA: Diagnosis not present

## 2021-12-20 DIAGNOSIS — K219 Gastro-esophageal reflux disease without esophagitis: Secondary | ICD-10-CM | POA: Diagnosis present

## 2021-12-20 DIAGNOSIS — D631 Anemia in chronic kidney disease: Secondary | ICD-10-CM | POA: Diagnosis present

## 2021-12-20 DIAGNOSIS — I959 Hypotension, unspecified: Secondary | ICD-10-CM | POA: Diagnosis present

## 2021-12-20 DIAGNOSIS — Z8249 Family history of ischemic heart disease and other diseases of the circulatory system: Secondary | ICD-10-CM | POA: Diagnosis not present

## 2021-12-20 DIAGNOSIS — I480 Paroxysmal atrial fibrillation: Secondary | ICD-10-CM | POA: Diagnosis present

## 2021-12-20 DIAGNOSIS — N390 Urinary tract infection, site not specified: Secondary | ICD-10-CM | POA: Diagnosis present

## 2021-12-20 DIAGNOSIS — I5032 Chronic diastolic (congestive) heart failure: Secondary | ICD-10-CM | POA: Diagnosis present

## 2021-12-20 DIAGNOSIS — I255 Ischemic cardiomyopathy: Secondary | ICD-10-CM

## 2021-12-20 DIAGNOSIS — Z8049 Family history of malignant neoplasm of other genital organs: Secondary | ICD-10-CM

## 2021-12-20 DIAGNOSIS — E669 Obesity, unspecified: Secondary | ICD-10-CM | POA: Diagnosis present

## 2021-12-20 DIAGNOSIS — I2510 Atherosclerotic heart disease of native coronary artery without angina pectoris: Secondary | ICD-10-CM | POA: Diagnosis present

## 2021-12-20 DIAGNOSIS — I2584 Coronary atherosclerosis due to calcified coronary lesion: Secondary | ICD-10-CM | POA: Diagnosis present

## 2021-12-20 DIAGNOSIS — I482 Chronic atrial fibrillation, unspecified: Secondary | ICD-10-CM | POA: Diagnosis not present

## 2021-12-20 DIAGNOSIS — I447 Left bundle-branch block, unspecified: Secondary | ICD-10-CM | POA: Diagnosis present

## 2021-12-20 DIAGNOSIS — Z20822 Contact with and (suspected) exposure to covid-19: Secondary | ICD-10-CM | POA: Diagnosis not present

## 2021-12-20 DIAGNOSIS — J45909 Unspecified asthma, uncomplicated: Secondary | ICD-10-CM | POA: Diagnosis present

## 2021-12-20 DIAGNOSIS — I5181 Takotsubo syndrome: Principal | ICD-10-CM

## 2021-12-20 DIAGNOSIS — R079 Chest pain, unspecified: Secondary | ICD-10-CM | POA: Diagnosis not present

## 2021-12-20 DIAGNOSIS — I214 Non-ST elevation (NSTEMI) myocardial infarction: Secondary | ICD-10-CM | POA: Diagnosis not present

## 2021-12-20 DIAGNOSIS — I25118 Atherosclerotic heart disease of native coronary artery with other forms of angina pectoris: Secondary | ICD-10-CM | POA: Diagnosis not present

## 2021-12-20 DIAGNOSIS — Z85048 Personal history of other malignant neoplasm of rectum, rectosigmoid junction, and anus: Secondary | ICD-10-CM

## 2021-12-20 DIAGNOSIS — Z79899 Other long term (current) drug therapy: Secondary | ICD-10-CM | POA: Diagnosis not present

## 2021-12-20 LAB — BASIC METABOLIC PANEL
Anion gap: 9 (ref 5–15)
BUN: 20 mg/dL (ref 8–23)
CO2: 24 mmol/L (ref 22–32)
Calcium: 9.4 mg/dL (ref 8.9–10.3)
Chloride: 105 mmol/L (ref 98–111)
Creatinine, Ser: 1.29 mg/dL — ABNORMAL HIGH (ref 0.44–1.00)
GFR, Estimated: 40 mL/min — ABNORMAL LOW (ref >60)
Glucose, Bld: 162 mg/dL — ABNORMAL HIGH (ref 70–99)
Potassium: 4.9 mmol/L (ref 3.5–5.1)
Sodium: 138 mmol/L (ref 135–145)

## 2021-12-20 LAB — GLUCOSE, CAPILLARY: Glucose-Capillary: 105 mg/dL — ABNORMAL HIGH (ref 70–99)

## 2021-12-20 LAB — URINALYSIS, COMPLETE (UACMP) WITH MICROSCOPIC
Bacteria, UA: NONE SEEN
Bilirubin Urine: NEGATIVE
Glucose, UA: NEGATIVE mg/dL
Hgb urine dipstick: NEGATIVE
Ketones, ur: NEGATIVE mg/dL
Leukocytes,Ua: NEGATIVE
Nitrite: NEGATIVE
Protein, ur: NEGATIVE mg/dL
Specific Gravity, Urine: 1.008 (ref 1.005–1.030)
pH: 6 (ref 5.0–8.0)

## 2021-12-20 LAB — BRAIN NATRIURETIC PEPTIDE: B Natriuretic Peptide: 162.4 pg/mL — ABNORMAL HIGH (ref 0.0–100.0)

## 2021-12-20 LAB — TROPONIN I (HIGH SENSITIVITY)
Troponin I (High Sensitivity): 359 ng/L (ref <18)
Troponin I (High Sensitivity): 1264 ng/L (ref <18)
Troponin I (High Sensitivity): 996 ng/L (ref <18)

## 2021-12-20 LAB — CBC
HCT: 37.1 % (ref 36.0–46.0)
Hemoglobin: 12.1 g/dL (ref 12.0–15.0)
MCH: 31.3 pg (ref 26.0–34.0)
MCHC: 32.6 g/dL (ref 30.0–36.0)
MCV: 96.1 fL (ref 80.0–100.0)
Platelets: 220 10*3/uL (ref 150–400)
RBC: 3.86 MIL/uL — ABNORMAL LOW (ref 3.87–5.11)
RDW: 15 % (ref 11.5–15.5)
WBC: 5 10*3/uL (ref 4.0–10.5)
nRBC: 0 % (ref 0.0–0.2)

## 2021-12-20 LAB — PROTIME-INR
INR: 2.6 — ABNORMAL HIGH (ref 0.8–1.2)
INR: 2.8 — ABNORMAL HIGH (ref 0.8–1.2)
Prothrombin Time: 28 seconds — ABNORMAL HIGH (ref 11.4–15.2)
Prothrombin Time: 29.5 seconds — ABNORMAL HIGH (ref 11.4–15.2)

## 2021-12-20 LAB — CBG MONITORING, ED: Glucose-Capillary: 162 mg/dL — ABNORMAL HIGH (ref 70–99)

## 2021-12-20 LAB — APTT
aPTT: 46 seconds — ABNORMAL HIGH (ref 24–36)
aPTT: >200 seconds (ref 24–36)

## 2021-12-20 LAB — HEPARIN LEVEL (UNFRACTIONATED): Heparin Unfractionated: >1.1 IU/mL — ABNORMAL HIGH (ref 0.30–0.70)

## 2021-12-20 MED ORDER — PANTOPRAZOLE SODIUM 40 MG PO TBEC
40.0000 mg | DELAYED_RELEASE_TABLET | Freq: Every day | ORAL | Status: DC
  Administered 2021-12-21 – 2021-12-24 (×4): 40 mg via ORAL
  Filled 2021-12-20 (×4): qty 1

## 2021-12-20 MED ORDER — AMIODARONE HCL 200 MG PO TABS
200.0000 mg | ORAL_TABLET | Freq: Two times a day (BID) | ORAL | Status: DC
  Administered 2021-12-21 – 2021-12-22 (×3): 200 mg via ORAL
  Filled 2021-12-20 (×3): qty 1

## 2021-12-20 MED ORDER — HEPARIN SODIUM (PORCINE) 5000 UNIT/ML IJ SOLN: 5000.0000 [IU] | Freq: Once | INTRAMUSCULAR | Status: DC

## 2021-12-20 MED ORDER — INSULIN ASPART 100 UNIT/ML IJ SOLN: 0.0000 [IU] | Freq: Every day | INTRAMUSCULAR | Status: DC

## 2021-12-20 MED ORDER — ONDANSETRON HCL 4 MG/2ML IJ SOLN: 4.0000 mg | Freq: Three times a day (TID) | INTRAMUSCULAR | Status: DC | PRN

## 2021-12-20 MED ORDER — SODIUM CHLORIDE 0.9 % IV SOLN
1.0000 g | INTRAVENOUS | Status: DC
  Administered 2021-12-20: 1 g via INTRAVENOUS
  Filled 2021-12-20 (×2): qty 10

## 2021-12-20 MED ORDER — VITAMIN D 25 MCG (1000 UNIT) PO TABS
3000.0000 [IU] | ORAL_TABLET | Freq: Every day | ORAL | Status: DC
  Administered 2021-12-21 – 2021-12-24 (×4): 3000 [IU] via ORAL
  Filled 2021-12-20 (×4): qty 3

## 2021-12-20 MED ORDER — DM-GUAIFENESIN ER 30-600 MG PO TB12: 1.0000 | ORAL_TABLET | Freq: Two times a day (BID) | ORAL | Status: DC | PRN

## 2021-12-20 MED ORDER — MONTELUKAST SODIUM 10 MG PO TABS
10.0000 mg | ORAL_TABLET | Freq: Every day | ORAL | Status: DC
  Administered 2021-12-20 – 2021-12-23 (×4): 10 mg via ORAL
  Filled 2021-12-20 (×4): qty 1

## 2021-12-20 MED ORDER — PREGABALIN 75 MG PO CAPS
75.0000 mg | ORAL_CAPSULE | Freq: Two times a day (BID) | ORAL | Status: DC
  Administered 2021-12-21 – 2021-12-24 (×6): 75 mg via ORAL
  Filled 2021-12-20 (×6): qty 1

## 2021-12-20 MED ORDER — HEPARIN SODIUM (PORCINE) 5000 UNIT/ML IJ SOLN
4000.0000 [IU] | Freq: Once | INTRAMUSCULAR | Status: AC
  Administered 2021-12-20: 4000 [IU] via INTRAVENOUS
  Filled 2021-12-20: qty 1

## 2021-12-20 MED ORDER — NITROGLYCERIN 0.4 MG SL SUBL
0.4000 mg | SUBLINGUAL_TABLET | SUBLINGUAL | Status: DC | PRN
  Administered 2021-12-20: 0.4 mg via SUBLINGUAL
  Filled 2021-12-20: qty 1

## 2021-12-20 MED ORDER — MORPHINE SULFATE (PF) 2 MG/ML IV SOLN: 1.0000 mg | INTRAVENOUS | Status: DC | PRN

## 2021-12-20 MED ORDER — MIRABEGRON ER 50 MG PO TB24
50.0000 mg | ORAL_TABLET | Freq: Every day | ORAL | Status: DC
  Administered 2021-12-21 – 2021-12-24 (×3): 50 mg via ORAL
  Filled 2021-12-20 (×4): qty 1

## 2021-12-20 MED ORDER — HYDRALAZINE HCL 20 MG/ML IJ SOLN
5.0000 mg | INTRAMUSCULAR | Status: DC | PRN
Start: 2021-12-20 — End: 2021-12-23

## 2021-12-20 MED ORDER — ISOSORBIDE MONONITRATE ER 30 MG PO TB24
30.0000 mg | ORAL_TABLET | Freq: Every day | ORAL | Status: DC
  Administered 2021-12-20 – 2021-12-23 (×4): 30 mg via ORAL
  Filled 2021-12-20 (×4): qty 1

## 2021-12-20 MED ORDER — VITAMIN C 500 MG PO TABS
250.0000 mg | ORAL_TABLET | Freq: Every day | ORAL | Status: DC
  Administered 2021-12-21 – 2021-12-24 (×4): 250 mg via ORAL
  Filled 2021-12-20 (×4): qty 1

## 2021-12-20 MED ORDER — OMEGA-3-ACID ETHYL ESTERS 1 G PO CAPS
2.0000 g | ORAL_CAPSULE | Freq: Two times a day (BID) | ORAL | Status: DC
  Administered 2021-12-20 – 2021-12-24 (×7): 2 g via ORAL
  Filled 2021-12-20 (×8): qty 2

## 2021-12-20 MED ORDER — INSULIN ASPART 100 UNIT/ML IJ SOLN
0.0000 [IU] | Freq: Three times a day (TID) | INTRAMUSCULAR | Status: DC
  Administered 2021-12-20: 2 [IU] via SUBCUTANEOUS
  Administered 2021-12-21 (×3): 1 [IU] via SUBCUTANEOUS
  Administered 2021-12-22: 2 [IU] via SUBCUTANEOUS
  Administered 2021-12-22 – 2021-12-24 (×4): 1 [IU] via SUBCUTANEOUS
  Filled 2021-12-20 (×9): qty 1

## 2021-12-20 MED ORDER — FERROUS SULFATE 325 (65 FE) MG PO TABS
325.0000 mg | ORAL_TABLET | Freq: Every day | ORAL | Status: DC
  Administered 2021-12-21 – 2021-12-24 (×4): 325 mg via ORAL
  Filled 2021-12-20 (×4): qty 1

## 2021-12-20 MED ORDER — LACTATED RINGERS IV BOLUS
250.0000 mL | Freq: Once | INTRAVENOUS | Status: AC
  Administered 2021-12-20: 250 mL via INTRAVENOUS

## 2021-12-20 MED ORDER — ACETAMINOPHEN 325 MG PO TABS
650.0000 mg | ORAL_TABLET | Freq: Four times a day (QID) | ORAL | Status: DC | PRN
  Administered 2021-12-21 – 2021-12-24 (×4): 650 mg via ORAL
  Filled 2021-12-20 (×4): qty 2

## 2021-12-20 MED ORDER — IRON-VITAMIN C 65-125 MG PO TABS
1.0000 | ORAL_TABLET | Freq: Every day | ORAL | Status: DC
Start: 2021-12-21 — End: 2021-12-20

## 2021-12-20 MED ORDER — METOPROLOL SUCCINATE ER 50 MG PO TB24
50.0000 mg | ORAL_TABLET | Freq: Every day | ORAL | Status: DC
  Administered 2021-12-21 – 2021-12-24 (×4): 50 mg via ORAL
  Filled 2021-12-20 (×4): qty 1

## 2021-12-20 MED ORDER — HEPARIN (PORCINE) 25000 UT/250ML-% IV SOLN
800.0000 [IU]/h | INTRAVENOUS | Status: DC
  Administered 2021-12-20 – 2021-12-21 (×2): 800 [IU]/h via INTRAVENOUS
  Filled 2021-12-20 (×2): qty 250

## 2021-12-20 MED ORDER — EZETIMIBE 10 MG PO TABS
10.0000 mg | ORAL_TABLET | Freq: Every morning | ORAL | Status: DC
  Administered 2021-12-21 – 2021-12-24 (×4): 10 mg via ORAL
  Filled 2021-12-20 (×4): qty 1

## 2021-12-20 MED ORDER — MAGNESIUM OXIDE 400 MG PO TABS
400.0000 mg | ORAL_TABLET | Freq: Every day | ORAL | Status: DC
  Administered 2021-12-21 – 2021-12-24 (×4): 400 mg via ORAL
  Filled 2021-12-20 (×8): qty 1

## 2021-12-20 MED ORDER — ASPIRIN 81 MG PO CHEW
324.0000 mg | CHEWABLE_TABLET | Freq: Once | ORAL | Status: AC
  Administered 2021-12-20: 324 mg via ORAL
  Filled 2021-12-20: qty 4

## 2021-12-20 MED ORDER — ATORVASTATIN CALCIUM 20 MG PO TABS
20.0000 mg | ORAL_TABLET | Freq: Every morning | ORAL | Status: DC
  Administered 2021-12-21 – 2021-12-24 (×4): 20 mg via ORAL
  Filled 2021-12-20 (×4): qty 1

## 2021-12-20 MED ORDER — ALBUTEROL SULFATE (2.5 MG/3ML) 0.083% IN NEBU: 2.5000 mg | INHALATION_SOLUTION | RESPIRATORY_TRACT | Status: DC | PRN

## 2021-12-20 NOTE — Assessment & Plan Note (Addendum)
-  start rocephin IV -Check urinalysis and urine culture

## 2021-12-20 NOTE — ED Triage Notes (Signed)
Pt arrives with c/o chest pain after she had an episode of vomiting. Pt is on a course of antibiotics for an UTI. Pt describes pain as a heaviness. Pt endorses SOB.

## 2021-12-20 NOTE — ED Notes (Signed)
Attempted to message floor for report x2

## 2021-12-20 NOTE — Progress Notes (Signed)
[employer] - low blood pressures reported from nursing - 78/47 (58) 88/53 (63) Patient stated she has not been drinking well at home since last hospitalization.  She denied increased colostomy output  Action - 250 ml LR bolus  Cuff changed to normal adult size and repositioned left arm   Response -  BP  100/69 (75)  Kathlene Cote NP [facility]

## 2021-12-20 NOTE — Assessment & Plan Note (Addendum)
Heart rate 76. -Continue metoprolol, amiodarone -Hold Xarelto since patient is on IV heparin

## 2021-12-20 NOTE — Assessment & Plan Note (Addendum)
-  IV hydralazine as needed -Metoprolol

## 2021-12-20 NOTE — Consult Note (Signed)
ANTICOAGULATION CONSULT NOTE - Initial Consult  Pharmacy Consult for Heparin infusion Indication: chest pain/ACS  Allergies  Allergen Reactions   Levofloxacin Nausea Only    Patient reports that she thinks that she could take this medication.    Niacin Other (See Comments)    Hot, Flushed   Niacin And Related Other (See Comments)    Pt states she get real red and hot.    Patient Measurements: Height: '5\' 2"'$  (157.5 cm) Weight: 79.4 kg (175 lb) IBW/kg (Calculated) : 50.1 Heparin Dosing Weight: 67.7 kg  Vital Signs: Temp: 97.7 F (36.5 C) (09/08 1304) Temp Source: Oral (09/08 1304) BP: 159/86 (09/08 1402) Pulse Rate: 76 (09/08 1304)  Labs: Recent Labs    12/20/21 1302  HGB 12.1  HCT 37.1  PLT 220  CREATININE 1.29*  TROPONINIHS 359*    Estimated Creatinine Clearance: 30.5 mL/min (A) (by C-G formula based on SCr of 1.29 mg/dL (H)).   Medical History: Past Medical History:  Diagnosis Date   Anal cancer ([place])    Diabetes mellitus    Hyperlipidemia    Hypertension     Medications:  Scheduled:   aspirin  324 mg Oral Once   heparin  4,000 Units Intravenous Once   Infusions:  PRN: nitroGLYCERIN  Assessment: Sarah Potts is a 88 y.o. female presenting with chest pain and vomiting. Per patient, last dose of Xarelto was 9/8 AM. PMH significant for CAD, HTN, DM, HLD, obesity, anal cancer, AoCKD3a, Afib on Xarelto. hsTrop 359. Pharmacy has been consulted to initiate and manage heparin infusion.  Baseline Labs: aPTT 46, HL >1.10, PT 28.0, INR 2.6, Hgb 12.1, Hct 37.1, Plt 220   Goal of Therapy:  Heparin level 0.3-0.7 units/ml Monitor platelets by anticoagulation protocol: Yes   Plan:  Give 4000 units bolus x 1 Start heparin infusion at 800 units/hr Check aPTT in 8 hours  Monitor daily HL and aPTT until correlated then switch to daily HL monitoring Continue to monitor H&H and platelets  Thank you for allowing pharmacy to be a part of this patient's  care.  Gretel Acre, PharmD PGY1 Pharmacy Resident 12/20/2021 2:48 PM

## 2021-12-20 NOTE — Telephone Encounter (Signed)
Pt states she has been taking Cefuroxime 500 mg BID since Wednesday for UTI. Today at 11 am she vomited "a lot" and feels "aches all over my body." Pt also has chest heaviness that has been constant since this morning. Pt does have shortness of breath but reports not more than her usual. Pt's BP was 198/127 earlier today and has re-checked and now is 130/91.   Pt still with heaviness in chest and shortness of breath.  Advised pt to call 911. Pt states she will call her son and if no one can drive her she will call 911.  Again advised pt call 911 and pt voiced understanding.

## 2021-12-20 NOTE — Assessment & Plan Note (Signed)
-  see above 

## 2021-12-20 NOTE — ED Provider Notes (Signed)
[facility] Provider Note    Event Date/Time   First MD Initiated Contact with Patient 12/20/21 1356     (approximate)   History   Chest Pain   HPI  Sarah Potts is a 88 y.o. female   Past medical history of MI with PCI, A-fib on Xarelto, here with nausea and chest pressure acute onset since 11A.   Has no preceding illnesses, no shortness of breath, no respiratory infectious symptoms.  No other medical complaints at this time.  She has been compliant with all of her medications.  History was obtained via patient and family member at bedside as well as review of external notes      Physical Exam   Triage Vital Signs: ED Triage Vitals  Enc Vitals Group     BP 12/20/21 1304 (!) 144/87     Pulse Rate 12/20/21 1304 76     Resp 12/20/21 1304 15     Temp 12/20/21 1304 97.7 F (36.5 C)     Temp Source 12/20/21 1304 Oral     SpO2 12/20/21 1304 97 %     Weight 12/20/21 1301 175 lb (79.4 kg)     Height 12/20/21 1301 5' 2"  (1.575 m)     Head Circumference --      Peak Flow --      Pain Score 12/20/21 1300 4     Pain Loc --      Pain Edu? --      Excl. in [place]? --     Most recent vital signs: Vitals:   12/20/21 1402 12/20/21 1430  BP: (!) 159/86 123/70  Pulse:  74  Resp: 19 16  Temp:    SpO2:  99%    General: Awake, no distress.  CV:  Good peripheral perfusion.  Radial pulses intact and equal bilaterally Resp:  Normal effort.  Clear to auscultation bilaterally Abd:  No distention.  Nontender   ED Results / Procedures / Treatments   Labs (all labs ordered are listed, but only abnormal results are displayed) Labs Reviewed  BASIC METABOLIC PANEL - Abnormal; Notable for the following components:      Result Value   Glucose, Bld 162 (*)    Creatinine, Ser 1.29 (*)    GFR, Estimated 40 (*)    All other components within normal limits  CBC - Abnormal; Notable for the following components:   RBC 3.86 (*)    All other components within  normal limits  BRAIN NATRIURETIC PEPTIDE - Abnormal; Notable for the following components:   B Natriuretic Peptide 162.4 (*)    All other components within normal limits  HEPARIN LEVEL (UNFRACTIONATED) - Abnormal; Notable for the following components:   Heparin Unfractionated >1.10 (*)    All other components within normal limits  PROTIME-INR - Abnormal; Notable for the following components:   Prothrombin Time 28.0 (*)    INR 2.6 (*)    All other components within normal limits  APTT - Abnormal; Notable for the following components:   aPTT 46 (*)    All other components within normal limits  TROPONIN I (HIGH SENSITIVITY) - Abnormal; Notable for the following components:   Troponin I (High Sensitivity) 359 (*)    All other components within normal limits  URINE CULTURE  APTT  PROTIME-INR  URINALYSIS, COMPLETE (UACMP) WITH MICROSCOPIC  TROPONIN I (HIGH SENSITIVITY)  TROPONIN I (HIGH SENSITIVITY)     I reviewed labs and they are notable for troponin  300+  EKG  ED ECG REPORT I, Lucillie Garfinkel, the attending physician, personally viewed and interpreted this ECG.   Date: 12/20/2021  EKG Time: 1401  Rate: 75  Rhythm: normal EKG, normal sinus rhythm, unchanged from previous tracings, LBBB   Intervals: Wide complex consistent with left bundle branch block with no evidence of active ischemia Sgarbossa negative    RADIOLOGY I independently reviewed and interpreted the x-ray and see no obvious pneumothorax or focal opacities   PROCEDURES:  Critical Care performed: Yes, see critical care procedure note(s)  .Critical Care  Performed by: Lucillie Garfinkel, MD Authorized by: Lucillie Garfinkel, MD   Critical care provider statement:    Critical care time (minutes):  30   Critical care was necessary to treat or prevent imminent or life-threatening deterioration of the following conditions:  Cardiac failure   Critical care was time spent personally by me on the following activities:   Development of treatment plan with patient or surrogate, discussions with consultants, discussions with primary provider, ordering and review of radiographic studies, ordering and review of laboratory studies, pulse oximetry, re-evaluation of patient's condition, review of old charts, examination of patient and evaluation of patient's response to treatment   Care discussed with: admitting provider      MEDICATIONS ORDERED IN ED: Medications  nitroGLYCERIN (NITROSTAT) SL tablet 0.4 mg (0.4 mg Sublingual Given 12/20/21 1439)  morphine (PF) 2 MG/ML injection 1 mg (has no administration in time range)  heparin ADULT infusion 100 units/mL (25000 units/272m) (800 Units/hr Intravenous New Bag/Given 12/20/21 1530)  albuterol (PROVENTIL) (2.5 MG/3ML) 0.083% nebulizer solution 2.5 mg (has no administration in time range)  dextromethorphan-guaiFENesin (MUCINEX DM) 30-600 MG per 12 hr tablet 1 tablet (has no administration in time range)  ondansetron (ZOFRAN) injection 4 mg (has no administration in time range)  acetaminophen (TYLENOL) tablet 650 mg (has no administration in time range)  hydrALAZINE (APRESOLINE) injection 5 mg (has no administration in time range)  insulin aspart (novoLOG) injection 0-5 Units (has no administration in time range)  insulin aspart (novoLOG) injection 0-9 Units (has no administration in time range)  isosorbide mononitrate (IMDUR) 24 hr tablet 30 mg (has no administration in time range)  amiodarone (PACERONE) tablet 200 mg (has no administration in time range)  atorvastatin (LIPITOR) tablet 20 mg (has no administration in time range)  ezetimibe (ZETIA) tablet 10 mg (has no administration in time range)  metoprolol succinate (TOPROL-XL) 24 hr tablet 50 mg (has no administration in time range)  omega-3 acid ethyl esters (LOVAZA) capsule 2 g (has no administration in time range)  magnesium oxide (MAG-OX) tablet 400 mg (has no administration in time range)  pantoprazole (PROTONIX)  EC tablet 40 mg (has no administration in time range)  mirabegron ER (MYRBETRIQ) tablet 50 mg (has no administration in time range)  Iron-Vitamin C 65-125 MG TABS 1 tablet (has no administration in time range)  pregabalin (LYRICA) capsule 75 mg (has no administration in time range)  Cholecalciferol 1,000 Units (has no administration in time range)  montelukast (SINGULAIR) tablet 10 mg (has no administration in time range)  aspirin chewable tablet 324 mg (324 mg Oral Given 12/20/21 1438)  heparin injection 4,000 Units (4,000 Units Intravenous Given 12/20/21 1438)    Consultants:  I spoke with cardiologist Dr.Kowalksi Regarding care plan for this patient.   IMPRESSION / MDM / ASSESSMENT AND PLAN / ED COURSE  I reviewed the triage vital signs and the nursing notes.  Differential diagnosis includes, but is not limited to, DS given chest pain characteristic of ACS, history of CAD, non-STEMI on EKG but evidence of troponin leak, NSTEMI treated with heparin bolus and infusion.  Upon initial assessment, patient was given sublingual nitro and full dose of chewable aspirin.  She looked relatively comfortable when I met her with lingering mild symptoms.  Spoke with cardiologist about NSTEMI and hospitalist regarding admission.  Patient's presentation is most consistent with acute presentation with potential threat to life or bodily function.       FINAL CLINICAL IMPRESSION(S) / ED DIAGNOSES   Final diagnoses:  NSTEMI (non-ST elevated myocardial infarction) ([place])     Rx / DC Orders   ED Discharge Orders     None        Note:  This document was prepared using Dragon voice recognition software and may include unintentional dictation errors.    Lucillie Garfinkel, MD 12/20/21 [phone]

## 2021-12-20 NOTE — Assessment & Plan Note (Addendum)
Recent A1c 7.2 on 11/30/2021, poorly controlled.  Patient is taking Trulicity and metformin at home. -Sliding scale insulin

## 2021-12-20 NOTE — Assessment & Plan Note (Addendum)
2D echo 12/01/2021 showed EF of 55-60% with grade 1 diastolic dysfunction.  Patient does not have leg edema or JVD.  CHF seem to be compensated. -Watch volume status closely. -Check BNP --> 162.

## 2021-12-20 NOTE — Telephone Encounter (Signed)
  Pt c/o of Chest Pain: STAT if CP now or developed within 24 hours  1. Are you having CP right now? Felt heavy   2. Are you experiencing any other symptoms (ex. SOB, nausea, vomiting, sweating)?  Vomiting  3. How long have you been experiencing CP? Today   4. Is your CP continuous or coming and going?  5. Have you taken Nitroglycerin? No    Pt's chest felt heavy after taking her antibiotics for her UTI. She said, she throw up twice and her BP went up to 198/124 but now it went down to 811 systolic ?

## 2021-12-20 NOTE — ED Notes (Signed)
Pt endorsing headache "its not bad" when asking if nitro will help headache

## 2021-12-20 NOTE — ED Notes (Signed)
Md messaged about BP.

## 2021-12-20 NOTE — Assessment & Plan Note (Signed)
Renal function stable at baseline.  Baseline creatinine 1.32 on 12/01/2021.  Her creatinine is 1.29, BUN 20, GFR 40 today. -Follow-up with BMP

## 2021-12-20 NOTE — Consult Note (Addendum)
Cardiology Consultation:   Patient ID: Sarah Potts; [id number]; Nov 12, 1932   Admit date: 12/20/2021 Date of Consult: 12/20/2021  Primary Care Provider: [place] Pink, MD Primary Cardiologist: Rockey Situ Primary Electrophysiologist:  None   Patient Profile:   Sarah Potts is a 88 y.o. female with a hx of CAD status post PCI to the LCx in 2019 in [place], PAF diagnosed in 2016 on Xarelto, HFpEF, DM2, CKD stage IIIa, HTN, HLD, anemia, and anal cancer status post colostomy who is being seen today for the evaluation of chest pain and elevated troponin at the request of Dr. Blaine Hamper.  History of Present Illness:   Ms. Irion was diagnosed with A-fib in 2016 in [place] and has been maintained on Xarelto.  She was admitted with a suspected NSTEMI in 03/2018 while living in [place] and underwent PCI to the LCx.   More recently, she was admitted to [facility] in 11/2021 with A-fib with RVR with associated low-grade fever, chest pain, neck pain, and "whole body shaking."  High-sensitivity troponin was normal.  TSH normal.  COVID-negative.  She spontaneously converted to sinus rhythm on IV amiodarone.  Given she had fallen and hit her head leading up to her admission, CT head was pursued which showed no acute intracranial abnormality.  Echo during the admission demonstrated an EF of 55 to 60%, no regional wall motion abnormalities, grade 1 diastolic dysfunction, normal RV systolic function and ventricular cavity size, moderate mitral annular calcification, aortic valve sclerosis without evidence of stenosis, and an estimated right atrial pressure of 3 mmHg.  Admission was otherwise notable for mild hypotension, for which her chronic Nitropatch had been discontinued.  She was seen in hospital follow-up on 12/03/2021 noting fatigue.  She was maintaining sinus rhythm.  Blood pressure was soft at 94/56.   She has recently been treated for a UTI and earlier this morning she "vomited a lot" with subsequent diffuse myalgias.   She reported chest heaviness that had been present since earlier this morning with stable shortness of breath.  BP at home was elevated at home.  She was advised to proceed to the ED.   She was admitted to [facility] on 12/20/2021 with onset of nausea and vomiting after eating breakfast and taking a new antibiotic.  This was followed by the development of a dull left-sided chest pain that radiated to the left shoulder and was rated a 7 out of 10.  No associated dyspnea, palpitations, dizziness, or diaphoresis.  Pain did feel similar to her angina leading up to her PCI in 2019.  Pain persisted until receiving aspirin and SL NTG in the ED.  BP stable in the 627O to 350K systolic.  Heart rate in the 70s bpm.  Afebrile.  Oxygen saturation 97 to 99% on room air.  EKG showed sinus rhythm, 74 bpm, first-degree AV block, nonspecific IVCD, nonspecific lateral ST-T changes.  Chest x-ray showed no active cardiopulmonary disease.  Initial high-sensitivity troponin 359 with delta pending.  BNP pending.  WBC 5.0, Hgb 12.1, PLT 220.  Potassium 4.9, BUN 20, serum creatinine 1.29.  In the ED, she was given aspirin 324 mg x 1, SL NTG x1, and placed on a heparin drip.  Currently, with improved, though not resolved chest pain.   Past Medical History:  Diagnosis Date   Anal cancer ([place])    Diabetes mellitus    Hyperlipidemia    Hypertension     Past Surgical History:  Procedure Laterality Date   BLADDER SURGERY  CARDIAC CATHETERIZATION     COLOSTOMY     CORONARY ANGIOPLASTY WITH STENT PLACEMENT  03/25/2018   Stent to the LCX  2.50 mm x 41m  [employer] [id number]  heart stent     TONSILLECTOMY     uterus surgery     removed     Home Meds: Prior to Admission medications   Medication Sig Start Date End Date Taking? Authorizing Provider  amiodarone (PACERONE) 200 MG tablet 1 tab by mouth twice a day for two weeks, then 1 tab by mouth daily 12/01/21  Yes Wouk, NAilene Rud MD   atorvastatin (LIPITOR) 20 MG tablet Take 1 tablet (20 mg total) by mouth in the morning. For cholesterol 02/05/21  Yes Gollan, TKathlene November MD  Cholecalciferol 25 MCG (1000 UT) tablet Take by mouth. Pt takes 3 daily   Yes [provider]  ezetimibe (ZETIA) 10 MG tablet Take 1 tablet (10 mg total) by mouth in the morning. For cholesterol 02/05/21  Yes Gollan, TKathlene November MD  Iron-Vitamin C 65-125 MG TABS Take 1 tablet by mouth daily. 01/09/21  Yes YEarlie Server MD  magnesium oxide (MAG-OX) 400 MG tablet Take 400 mg by mouth daily.   Yes [provider]  metFORMIN (GLUMETZA) 1000 MG (MOD) 24 hr tablet Take 1 tablet (1,000 mg total) by mouth daily with breakfast. 12/01/21  Yes Wouk, NAilene Rud MD  metoprolol succinate (TOPROL-XL) 50 MG 24 hr tablet TAKE 1 TABLET BY MOUTH EVERY DAY 12/02/21  Yes Gollan, TKathlene November MD  mirabegron ER (MYRBETRIQ) 50 MG TB24 tablet Take by mouth.   Yes [provider]  montelukast (SINGULAIR) 10 MG tablet Take 10 mg by mouth at bedtime.     Yes [provider]  nitroGLYCERIN (NITRODUR - DOSED IN MG/24 HR) 0.2 mg/hr patch  01/22/21  Yes [provider]  omega-3 acid ethyl esters (LOVAZA) 1 g capsule Take 2 g by mouth 2 (two) times daily.     Yes [provider]  omeprazole (PRILOSEC) 40 MG capsule Take 40 mg by mouth daily.     Yes [provider]  pregabalin (LYRICA) 75 MG capsule Take 75 mg by mouth 2 (two) times daily. 01/13/21  Yes [provider]  Rivaroxaban (XARELTO) 15 MG TABS tablet Take 1 tablet (15 mg total) by mouth daily. 02/05/21  Yes GMinna Merritts MD  meclizine (ANTIVERT) 25 MG tablet Take 25 mg by mouth 3 (three) times daily as needed. Patient not taking: Reported on 12/20/2021 07/07/21   [provider]  traZODone (DESYREL) 50 MG tablet Take 1 tablet by mouth at bedtime. Patient not taking: Reported on 12/20/2021 01/22/21 01/22/22  [provider]  TRULICITY 1.5 MGG/2.6RS SOPN Inject into the skin once a week. 12/16/20   [provider]    Inpatient Medications: Scheduled Meds:  insulin aspart  0-5 Units Subcutaneous QHS   insulin aspart  0-9 Units Subcutaneous TID WC   Continuous Infusions:  heparin 800 Units/hr (12/20/21 1530)   PRN Meds: acetaminophen, albuterol, dextromethorphan-guaiFENesin, hydrALAZINE, morphine injection, nitroGLYCERIN, ondansetron (ZOFRAN) IV  Allergies:   Allergies  Allergen Reactions   Levofloxacin Nausea Only    Patient reports that she thinks that she could take this medication.    Niacin Other (See Comments)    Hot, Flushed   Niacin And Related Other (See Comments)    Pt states she get real red and hot.    Social History:  Social History   Socioeconomic History   Marital status: Single    Spouse name: Not on file   Number of children: Not on file   Years of education: Not on file   Highest education level: Not on file  Occupational History   Not on file  Tobacco Use   Smoking status: Never   Smokeless tobacco: Never  Vaping Use   Vaping Use: Never used  Substance and Sexual Activity   Alcohol use: No   Drug use: No   Sexual activity: Not on file  Other Topics Concern   Not on file  Social History Narrative   Not on file   Social Determinants of Health   Financial Resource Strain: Not on file  Food Insecurity: Not on file  Transportation Needs: Not on file  Physical Activity: Not on file  Stress: Not on file  Social Connections: Not on file  Intimate Partner Violence: Not on file     Family History:   Family History  Problem Relation Age of Onset   Cancer Mother    Breast cancer Mother    Heart disease Father    Heart attack Father    Cancer Sister    Heart disease Sister    Cervical cancer Sister    Heart disease Brother     ROS:  Review of Systems  Constitutional:  Positive for malaise/fatigue. Negative for chills, diaphoresis, fever and weight loss.  HENT:  Negative for  congestion.   Eyes:  Negative for discharge and redness.  Respiratory:  Negative for cough, sputum production, shortness of breath and wheezing.   Cardiovascular:  Positive for chest pain. Negative for palpitations, orthopnea, claudication, leg swelling and PND.  Gastrointestinal:  Positive for nausea and vomiting. Negative for abdominal pain, blood in stool, heartburn and melena.  Musculoskeletal:  Negative for falls and myalgias.  Skin:  Negative for rash.  Neurological:  Positive for headaches. Negative for dizziness, tingling, tremors, sensory change, speech change, focal weakness, loss of consciousness and weakness.  Endo/Heme/Allergies:  Does not bruise/bleed easily.  Psychiatric/Behavioral:  Negative for substance abuse. The patient is not nervous/anxious.   All other systems reviewed and are negative.     Physical Exam/Data:   Vitals:   12/20/21 1301 12/20/21 1304 12/20/21 1402 12/20/21 1430  BP:  (!) 144/87 (!) 159/86 123/70  Pulse:  76  74  Resp:  '15 19 16  '$ Temp:  97.7 F (36.5 C)    TempSrc:  Oral    SpO2:  97%  99%  Weight: 79.4 kg     Height: '5\' 2"'$  (1.575 m)      No intake or output data in the 24 hours ending 12/20/21 1552 Filed Weights   12/20/21 1301  Weight: 79.4 kg   Body mass index is 32.01 kg/m.   Physical Exam: General: Well developed, well nourished, in no acute distress. Head: Normocephalic, atraumatic, sclera non-icteric, no xanthomas, nares without discharge.  Neck: Negative for carotid bruits. JVD not elevated. Lungs: Clear bilaterally to auscultation without wheezes, rales, or rhonchi. Breathing is unlabored. Heart: RRR with S1 S2. No murmurs, rubs, or gallops appreciated. Abdomen: Soft, non-tender, non-distended with normoactive bowel sounds. No hepatomegaly. No rebound/guarding. No obvious abdominal masses. Msk:  Strength and tone appear normal for age. Extremities: No clubbing or cyanosis. No edema. Distal pedal pulses are 2+ and equal  bilaterally. Neuro: Alert and oriented X 3. No facial asymmetry. No focal deficit. Moves all extremities spontaneously. Psych:  Responds to  questions appropriately with a normal affect.   EKG:  The EKG was personally reviewed and demonstrates: sinus rhythm, 74 bpm, first-degree AV block, nonspecific IVCD, nonspecific lateral ST-T changes Telemetry:  Telemetry was personally reviewed and demonstrates: SR  Weights: Filed Weights   12/20/21 1301  Weight: 79.4 kg    Relevant CV Studies:  2D echo 12/01/2021: 1. Left ventricular ejection fraction, by estimation, is 55 to 60%. The  left ventricle has normal function. The left ventricle has no regional  wall motion abnormalities. Left ventricular diastolic parameters are  consistent with Grade I diastolic  dysfunction (impaired relaxation).   2. Right ventricular systolic function is normal. The right ventricular  size is normal.   3. The mitral valve is normal in structure. No evidence of mitral valve  regurgitation. No evidence of mitral stenosis. Moderate mitral annular  calcification.   4. The aortic valve is normal in structure. Aortic valve regurgitation is  not visualized. Aortic valve sclerosis is present, with no evidence of  aortic valve stenosis.   5. The inferior vena cava is normal in size with greater than 50%  respiratory variability, suggesting right atrial pressure of 3 mmHg.  Laboratory Data:  Chemistry Recent Labs  Lab 12/20/21 1302  NA 138  K 4.9  CL 105  CO2 24  GLUCOSE 162*  BUN 20  CREATININE 1.29*  CALCIUM 9.4  GFRNONAA 40*  ANIONGAP 9    No results for input(s): "PROT", "ALBUMIN", "AST", "ALT", "ALKPHOS", "BILITOT" in the last 168 hours. Hematology Recent Labs  Lab 12/20/21 1302  WBC 5.0  RBC 3.86*  HGB 12.1  HCT 37.1  MCV 96.1  MCH 31.3  MCHC 32.6  RDW 15.0  PLT 220   Cardiac EnzymesNo results for input(s): "TROPONINI" in the last 168 hours. No results for input(s): "TROPIPOC" in the  last 168 hours.  BNP Recent Labs  Lab 12/20/21 1304  BNP 162.4*    DDimer No results for input(s): "DDIMER" in the last 168 hours.  Radiology/Studies:  DG Chest 2 View  Result Date: 12/20/2021 IMPRESSION: No active cardiopulmonary disease. Electronically Signed   By: Yetta Glassman M.D.   On: 12/20/2021 13:45    Assessment and Plan:   1. CAD involving the native coronary arteries with NSTEMI: -Currently, with improved, though not resolved chest pain -Trend troponin to peak  -Patient previously utilized a nitro patch chronically, though this was discontinued last month with hypotension, this and in the context of emesis, may have exacerbated her chest pain -Add Imdur 30 mg daily -As needed morphine to achieve chest pain-free status -Hemodynamically stable -Prior to admission, she has been maintained on Xarelto in place of aspirin given underlying A-fib and in the context of minimize bleeding risk  -Xarelto held upon admission, currently on heparin drip  -Update echo -Plan for LHC on 12/23/2021  2. PAF:  -Maintaining sinus rhythm  -PTA amiodarone 200 mg daily and Toprol-XL  -Heparin drip while admitted in place of Xarelto  -CHA2DS2-VASc at least 7 (CHF, HTN, age x 2, DM, vascular disease, sex category)   3. HFpEF:  -Appears grossly euvolemic and well compensated  -Not requiring a standing diuretic   4. CKD stage IIIa:  -Renal function stable   5. HTN:  -Blood pressure reasonably controlled in the ED   6. HLD:  -LDL 44  -PTA atorvastatin and ezetimibe      For questions or updates, please contact [place] Please consult [email or web address] for contact info under Cardiology/STEMI.  Signed, Christell Faith, PA-C [facility] Pager: [phone] 12/20/2021, 3:52 PM

## 2021-12-20 NOTE — Assessment & Plan Note (Addendum)
-   Lipitor and zetia

## 2021-12-20 NOTE — Assessment & Plan Note (Addendum)
NSTEMI and hx of CAD: s/p of stent. Trop 359. Consulted Dr. Fletcher Anon of card.  - admit to tele bed as inpatient -Trend Trop - Repeat EKG in the am  - prn Nitroglycerin, Morphine, lipitor  -pt received 324 mg of ASA in ED - Risk factor stratification: will check FLP - A1C was 7.2 on 11/30/21 --> will not repeat A1c today

## 2021-12-20 NOTE — H&P (Signed)
History and Physical    Sarah Potts ZOX:[id number] DOB: 1933/03/30 DOA: 12/20/2021  Referring MD/NP/PA:   PCP: Maryland Pink, MD   Patient coming from:  The patient is coming from home.    Chief Complaint: chest pain  HPI: Sarah Potts is a 88 y.o. female with medical history significant of CAD, s/p of stent placement, HTN, HLD, DM, asthma, CKD-3A, A fib on Xarelto, dCHF, anal cancer (s/p of colostomy, radiation and chemotherapy), GERD, who presents with chest pain.  Patient states that her chest pain started at about 11 AM, which is located in the central chest, pressure-like, initially 7 out of 10 in severity, currently 2 out of 10 in severity, radiating to the left shoulder.  Associated with mild shortness of breath and nausea, vomiting.  She has one-time of nonbilious nonbloody vomiting at home, which has resolved.  Currently patient does not have nausea, vomiting, diarrhea or abdominal pain.  Patient states that she has mild urinary frequency at night.  Her PCP checked her urine which was positive for UTI.  Patient was started on antibiotics since yesterday.  Patient states that she took Xarelto this morning.  Data reviewed independently and ED Course: pt was found to have troponin 359, WBC 5.0, stable renal function, temperature normal, blood pressure 123/70, heart rate 76, RR 19, oxygen saturation 97% on room air.  Chest x-ray negative.  EKG: I have personally reviewed.  Sinus rhythm, widening QRS wave diffusely, anteroseptal infarction pattern, right axis deviation, low voltage.   Review of Systems:   General: no fevers, chills, no body weight gain,  has fatigue HEENT: no blurry vision, hearing changes or sore throat Respiratory: has dyspnea, no coughing, wheezing CV: has chest pain, no palpitations GI: had nausea, vomiting,  no abdominal pain, diarrhea, constipation GU: no dysuria, burning on urination, increased urinary frequency, hematuria  Ext: no leg edema Neuro: no  unilateral weakness, numbness, or tingling, no vision change or hearing loss Skin: no rash, no skin tear. MSK: No muscle spasm, no deformity, no limitation of range of movement in spin Heme: No easy bruising.  Travel history: No recent long distant travel.   Allergy:  Allergies  Allergen Reactions   Levofloxacin Nausea Only    Patient reports that she thinks that she could take this medication.    Niacin Other (See Comments)    Hot, Flushed   Niacin And Related Other (See Comments)    Pt states she get real red and hot.    Past Medical History:  Diagnosis Date   Anal cancer ([place])    Diabetes mellitus    Hyperlipidemia    Hypertension     Past Surgical History:  Procedure Laterality Date   BLADDER SURGERY     CARDIAC CATHETERIZATION     COLOSTOMY     CORONARY ANGIOPLASTY WITH STENT PLACEMENT  03/25/2018   Stent to the LCX  2.50 mm x 49m  [employer] [id number]  heart stent     TONSILLECTOMY     uterus surgery     removed    Social History:  reports that she has never smoked. She has never used smokeless tobacco. She reports that she does not drink alcohol and does not use drugs.  Family History:  Family History  Problem Relation Age of Onset   Cancer Mother    Breast cancer Mother    Heart disease Father    Heart attack Father    Cancer Sister  Heart disease Sister    Cervical cancer Sister    Heart disease Brother      Prior to Admission medications   Medication Sig Start Date End Date Taking? Authorizing Provider  amiodarone (PACERONE) 200 MG tablet 1 tab by mouth twice a day for two weeks, then 1 tab by mouth daily 12/01/21   Wouk, Ailene Rud, MD  atorvastatin (LIPITOR) 20 MG tablet Take 1 tablet (20 mg total) by mouth in the morning. For cholesterol 02/05/21   Minna Merritts, MD  Cholecalciferol 25 MCG (1000 UT) tablet Take by mouth. Pt takes 3 daily    [provider]  ezetimibe (ZETIA) 10 MG tablet  Take 1 tablet (10 mg total) by mouth in the morning. For cholesterol 02/05/21   Gollan, Kathlene November, MD  Iron-Vitamin C 65-125 MG TABS Take 1 tablet by mouth daily. 01/09/21   [name], MD  magnesium oxide (MAG-OX) 400 MG tablet Take 400 mg by mouth daily.    [provider]  meclizine (ANTIVERT) 25 MG tablet Take 25 mg by mouth 3 (three) times daily as needed. 07/07/21   [provider]  metFORMIN (GLUMETZA) 1000 MG (MOD) 24 hr tablet Take 1 tablet (1,000 mg total) by mouth daily with breakfast. 12/01/21   Wouk, Ailene Rud, MD  metoprolol succinate (TOPROL-XL) 50 MG 24 hr tablet TAKE 1 TABLET BY MOUTH EVERY DAY 12/02/21   Minna Merritts, MD  mirabegron ER (MYRBETRIQ) 50 MG TB24 tablet Take by mouth.    [provider]  montelukast (SINGULAIR) 10 MG tablet Take 10 mg by mouth at bedtime.      [provider]  nitroGLYCERIN (NITRODUR - DOSED IN MG/24 HR) 0.2 mg/hr patch  01/22/21   [provider]  omega-3 acid ethyl esters (LOVAZA) 1 g capsule Take 2 g by mouth 2 (two) times daily.      [provider]  omeprazole (PRILOSEC) 40 MG capsule Take 40 mg by mouth daily.      [provider]  pregabalin (LYRICA) 75 MG capsule Take 75 mg by mouth 2 (two) times daily. 01/13/21   [provider]  Rivaroxaban (XARELTO) 15 MG TABS tablet Take 1 tablet (15 mg total) by mouth daily. 02/05/21   Minna Merritts, MD  traZODone (DESYREL) 50 MG tablet Take 1 tablet by mouth at bedtime. 01/22/21 01/22/22  [provider]  TRULICITY 1.5 QQ/2.2LN SOPN Inject into the skin once a week. 12/16/20   [provider]    Physical Exam: Vitals:   12/20/21 1301 12/20/21 1304 12/20/21 1402 12/20/21 1430  BP:  (!) 144/87 (!) 159/86 123/70  Pulse:  76  74  Resp:  '15 19 16  '$ Temp:  97.7 F (36.5 C)    TempSrc:  Oral    SpO2:  97%  99%  Weight: 79.4 kg     Height: '5\' 2"'$  (1.575 m)      General: Not in acute distress HEENT:       Eyes:  PERRL, EOMI, no scleral icterus.       ENT: No discharge from the ears and nose, no pharynx injection, no tonsillar enlargement.        Neck: No JVD, no bruit, no mass felt. Heme: No neck lymph node enlargement. Cardiac: S1/S2, RRR, No murmurs, No gallops or rubs. Respiratory: No rales, wheezing, rhonchi or rubs. GI: Soft, nondistended, nontender, no rebound pain, no organomegaly, BS present. S/p of colostomy GU: No hematuria Ext: No  pitting leg edema bilaterally. 1+DP/PT pulse bilaterally. Musculoskeletal: No joint deformities, No joint redness or warmth, no limitation of ROM in spin. Skin: No rashes.  Neuro: Alert, oriented X3, cranial nerves II-XII grossly intact, moves all extremities normally. Psych: Patient is not psychotic, no suicidal or hemocidal ideation.  Labs on Admission: I have personally reviewed following labs and imaging studies  CBC: Recent Labs  Lab 12/20/21 1302  WBC 5.0  HGB 12.1  HCT 37.1  MCV 96.1  PLT 938   Basic Metabolic Panel: Recent Labs  Lab 12/20/21 1302  NA 138  K 4.9  CL 105  CO2 24  GLUCOSE 162*  BUN 20  CREATININE 1.29*  CALCIUM 9.4   GFR: Estimated Creatinine Clearance: 30.5 mL/min (A) (by C-G formula based on SCr of 1.29 mg/dL (H)). Liver Function Tests: No results for input(s): "AST", "ALT", "ALKPHOS", "BILITOT", "PROT", "ALBUMIN" in the last 168 hours. No results for input(s): "LIPASE", "AMYLASE" in the last 168 hours. No results for input(s): "AMMONIA" in the last 168 hours. Coagulation Profile: Recent Labs  Lab 12/20/21 1304  INR 2.6*   Cardiac Enzymes: No results for input(s): "CKTOTAL", "CKMB", "CKMBINDEX", "TROPONINI" in the last 168 hours. BNP (last 3 results) No results for input(s): "PROBNP" in the last 8760 hours. HbA1C: No results for input(s): "HGBA1C" in the last 72 hours. CBG: No results for input(s): "GLUCAP" in the last 168 hours. Lipid Profile: No results for input(s): "CHOL", "HDL", "LDLCALC", "TRIG",  "CHOLHDL", "LDLDIRECT" in the last 72 hours. Thyroid Function Tests: No results for input(s): "TSH", "T4TOTAL", "FREET4", "T3FREE", "THYROIDAB" in the last 72 hours. Anemia Panel: No results for input(s): "VITAMINB12", "FOLATE", "FERRITIN", "TIBC", "IRON", "RETICCTPCT" in the last 72 hours. Urine analysis: No results found for: "COLORURINE", "APPEARANCEUR", "LABSPEC", "PHURINE", "GLUCOSEU", "HGBUR", "BILIRUBINUR", "KETONESUR", "PROTEINUR", "UROBILINOGEN", "NITRITE", "LEUKOCYTESUR" Sepsis Labs: '@LABRCNTIP'$ (procalcitonin:4,lacticidven:4) )No results found for this or any previous visit (from the past 240 hour(s)).   Radiological Exams on Admission: DG Chest 2 View  Result Date: 12/20/2021 CLINICAL DATA:  Chest pain EXAM: CHEST - 2 VIEW COMPARISON:  Chest x-ray dated November 30, 2021 FINDINGS: The heart size and mediastinal contours are within normal limits. Both lungs are clear. The visualized skeletal structures are unremarkable. IMPRESSION: No active cardiopulmonary disease. Electronically Signed   By: Yetta Glassman M.D.   On: 12/20/2021 13:45      Assessment/Plan Principal Problem:   NSTEMI (non-ST elevated myocardial infarction) ([place]) Active Problems:   CAD (coronary artery disease)   HTN (hypertension)   Hyperlipidemia   Stage 3a chronic kidney disease (HCC)   Atrial fibrillation, chronic (HCC)   Type II diabetes mellitus with renal manifestations (HCC)   Chronic diastolic CHF (congestive heart failure) (HCC)   UTI (urinary tract infection)   Asthma   Obesity with body mass index (BMI) of 30.0 to 39.9   Assessment and Plan: * NSTEMI (non-ST elevated myocardial infarction) ([place]) NSTEMI and hx of CAD: s/p of stent. Trop 359. Consulted Dr. Fletcher Anon of card.  - admit to tele bed as inpatient -Trend Trop - Repeat EKG in the am  - prn Nitroglycerin, Morphine, lipitor  -pt received 324 mg of ASA in ED - Risk factor stratification: will check FLP - A1C was 7.2 on 11/30/21 --> will  not repeat A1c today   CAD (coronary artery disease) -see above  HTN (hypertension) -IV hydralazine as needed -Metoprolol  Hyperlipidemia - Lipitor and zetia  Stage 3a chronic kidney disease ([place]) Renal function stable at baseline.  Baseline  creatinine 1.32 on 12/01/2021.  Her creatinine is 1.29, BUN 20, GFR 40 today. -Follow-up with BMP  Atrial fibrillation, chronic (HCC) Heart rate 76. -Continue metoprolol, amiodarone -Hold Xarelto since patient is on IV heparin  Type II diabetes mellitus with renal manifestations (HCC) Recent A1c 7.2 on 11/30/2021, poorly controlled.  Patient is taking Trulicity and metformin at home. -Sliding scale insulin  Chronic diastolic CHF (congestive heart failure) ([place]) 2D echo 12/01/2021 showed EF of 55-60% with grade 1 diastolic dysfunction.  Patient does not have leg edema or JVD.  CHF seem to be compensated. -Watch volume status closely. -Check BNP --> 162.  UTI (urinary tract infection) -start rocephin IV -Check urinalysis and urine culture          DVT ppx: on IV Heparin     Code Status: Full code  Family Communication: Yes, patient's  son  at bed side.    Disposition Plan:  Anticipate discharge back to previous environment  Consults called:  Dr. Fletcher Anon of card   Admission status and Level of care: Telemetry Cardiacas inpt         Dispo: The patient is from: Home              Anticipated d/c is to: Home              Anticipated d/c date is: 2 days              Patient currently is not medically stable to d/c.    Severity of Illness:  The appropriate patient status for this patient is INPATIENT. Inpatient status is judged to be reasonable and necessary in order to provide the required intensity of service to ensure the patient's safety. The patient's presenting symptoms, physical exam findings, and initial radiographic and laboratory data in the context of their chronic comorbidities is felt to place them at high risk  for further clinical deterioration. Furthermore, it is not anticipated that the patient will be medically stable for discharge from the hospital within 2 midnights of admission.   * I certify that at the point of admission it is my clinical judgment that the patient will require inpatient hospital care spanning beyond 2 midnights from the point of admission due to high intensity of service, high risk for further deterioration and high frequency of surveillance required.*       Date of Service 12/20/2021    [facility]   If 7PM-7AM, please contact night-coverage [email or web address] 12/20/2021, 4:35 PM

## 2021-12-21 ENCOUNTER — Inpatient Hospital Stay (HOSPITAL_COMMUNITY)
Admit: 2021-12-21 | Discharge: 2021-12-21 | Disposition: A | Discharge status: Home / Self Care | Payer: PPO | Attending: Physician Assistant | Admitting: Physician Assistant

## 2021-12-21 DIAGNOSIS — I480 Paroxysmal atrial fibrillation: Secondary | ICD-10-CM | POA: Diagnosis not present

## 2021-12-21 DIAGNOSIS — I214 Non-ST elevation (NSTEMI) myocardial infarction: Secondary | ICD-10-CM

## 2021-12-21 DIAGNOSIS — E1122 Type 2 diabetes mellitus with diabetic chronic kidney disease: Secondary | ICD-10-CM | POA: Diagnosis not present

## 2021-12-21 DIAGNOSIS — E782 Mixed hyperlipidemia: Secondary | ICD-10-CM

## 2021-12-21 DIAGNOSIS — I10 Essential (primary) hypertension: Secondary | ICD-10-CM | POA: Diagnosis not present

## 2021-12-21 LAB — LIPID PANEL
Cholesterol: 123 mg/dL (ref 0–200)
HDL: 42 mg/dL (ref >40)
LDL Cholesterol: 46 mg/dL (ref 0–99)
Total CHOL/HDL Ratio: 2.9 RATIO
Triglycerides: 177 mg/dL — ABNORMAL HIGH (ref <150)
VLDL: 35 mg/dL (ref 0–40)

## 2021-12-21 LAB — TROPONIN I (HIGH SENSITIVITY)
Troponin I (High Sensitivity): 657 ng/L (ref <18)
Troponin I (High Sensitivity): 415 ng/L (ref <18)

## 2021-12-21 LAB — ECHOCARDIOGRAM COMPLETE
AR max vel: 3 cm2
AV Peak grad: 4.8 mmHg
Ao pk vel: 1.09 m/s
Area-P 1/2: 2.55 cm2
Height: 62 in
S' Lateral: 2.85 cm
Weight: 2838.4 oz

## 2021-12-21 LAB — BASIC METABOLIC PANEL
Anion gap: 9 (ref 5–15)
BUN: 19 mg/dL (ref 8–23)
CO2: 24 mmol/L (ref 22–32)
Calcium: 8.9 mg/dL (ref 8.9–10.3)
Chloride: 106 mmol/L (ref 98–111)
Creatinine, Ser: 1.22 mg/dL — ABNORMAL HIGH (ref 0.44–1.00)
GFR, Estimated: 43 mL/min — ABNORMAL LOW (ref >60)
Glucose, Bld: 112 mg/dL — ABNORMAL HIGH (ref 70–99)
Potassium: 4.7 mmol/L (ref 3.5–5.1)
Sodium: 139 mmol/L (ref 135–145)

## 2021-12-21 LAB — GLUCOSE, CAPILLARY
Glucose-Capillary: 125 mg/dL — ABNORMAL HIGH (ref 70–99)
Glucose-Capillary: 140 mg/dL — ABNORMAL HIGH (ref 70–99)
Glucose-Capillary: 136 mg/dL — ABNORMAL HIGH (ref 70–99)
Glucose-Capillary: 132 mg/dL — ABNORMAL HIGH (ref 70–99)

## 2021-12-21 LAB — CBC
HCT: 31.2 % — ABNORMAL LOW (ref 36.0–46.0)
Hemoglobin: 10.2 g/dL — ABNORMAL LOW (ref 12.0–15.0)
MCH: 31.3 pg (ref 26.0–34.0)
MCHC: 32.7 g/dL (ref 30.0–36.0)
MCV: 95.7 fL (ref 80.0–100.0)
Platelets: 189 10*3/uL (ref 150–400)
RBC: 3.26 MIL/uL — ABNORMAL LOW (ref 3.87–5.11)
RDW: 15.1 % (ref 11.5–15.5)
WBC: 4.4 10*3/uL (ref 4.0–10.5)
nRBC: 0 % (ref 0.0–0.2)

## 2021-12-21 LAB — APTT
aPTT: 95 seconds — ABNORMAL HIGH (ref 24–36)
aPTT: 88 seconds — ABNORMAL HIGH (ref 24–36)

## 2021-12-21 LAB — HEPARIN LEVEL (UNFRACTIONATED): Heparin Unfractionated: >1.1 IU/mL — ABNORMAL HIGH (ref 0.30–0.70)

## 2021-12-21 MED ORDER — CEPHALEXIN 500 MG PO CAPS
500.0000 mg | ORAL_CAPSULE | Freq: Two times a day (BID) | ORAL | Status: DC
  Administered 2021-12-21 – 2021-12-24 (×5): 500 mg via ORAL
  Filled 2021-12-21 (×5): qty 1

## 2021-12-21 MED ORDER — PERFLUTREN LIPID MICROSPHERE
1.0000 mL | INTRAVENOUS | Status: AC | PRN
  Administered 2021-12-21: 3 mL via INTRAVENOUS

## 2021-12-21 NOTE — Progress Notes (Signed)
[facility] [place] NAME: Sarah Potts    MR#:  [id number]  DATE OF BIRTH:  07/03/1932  SUBJECTIVE:  in with chest pain nausea vomiting and exertional dyspnea. Feels a lot better. No family at bedside. Chest pain-free. Tolerating PO diet.    VITALS:  Blood pressure 121/61, pulse 64, temperature 98 F (36.7 C), resp. rate 18, height '5\' 2"'$  (1.575 m), weight 80.5 kg, SpO2 95 %.  PHYSICAL EXAMINATION:   GENERAL:  88 y.o.-year-old patient lying in the bed with no acute distress. Obese LUNGS: Normal breath sounds bilaterally, no wheezing, rales, rhonchi.  CARDIOVASCULAR: S1, S2 normal. No murmurs, rubs, or gallops.  ABDOMEN: Soft, nontender, nondistended. Bowel sounds present.  EXTREMITIES: No  edema b/l.    NEUROLOGIC: nonfocal  patient is alert and awake SKIN: No obvious rash, lesion, or ulcer.   LABORATORY PANEL:  CBC Recent Labs  Lab 12/21/21 0506  WBC 4.4  HGB 10.2*  HCT 31.2*  PLT 189    Chemistries  Recent Labs  Lab 12/21/21 0506  NA 139  K 4.7  CL 106  CO2 24  GLUCOSE 112*  BUN 19  CREATININE 1.22*  CALCIUM 8.9   Cardiac Enzymes No results for input(s): "TROPONINI" in the last 168 hours. RADIOLOGY:  ECHOCARDIOGRAM COMPLETE  Result Date: 12/21/2021    ECHOCARDIOGRAM REPORT   Patient Name:   Sarah Potts Date of Exam: 12/21/2021 Medical Rec #:  [id number]      Height:       62.0 in Accession #:    [id number]     Weight:       177.4 lb Date of Birth:  [birth year]      BSA:          1.817 m Patient Age:    67 years       BP:           121/59 mmHg Patient Gender: F              HR:           63 bpm. Exam Location:  [facility] Procedure: 2D Echo and Intracardiac Opacification Agent Indications:     NSTEMI I21.4  History:         Patient has prior history of Echocardiogram examinations, most                  recent 12/01/2021.  Sonographer:     Kathlen Brunswick RDCS Referring Phys:  [id number] [place] Diagnosing Phys: Ida Rogue  MD  Sonographer Comments: Technically difficult study due to poor echo windows and suboptimal apical window. Image acquisition challenging due to patient body habitus. IMPRESSIONS  1. Left ventricular ejection fraction, by estimation, is 40 to 45%. The left ventricle has mildly decreased function. The left ventricle demonstrates regional wall motion abnormalities (hypokinesis of teh anterior/anteroseptal and apical region). Left ventricular diastolic parameters are indeterminate.  2. Right ventricular systolic function is normal. The right ventricular size is normal. Tricuspid regurgitation signal is inadequate for assessing PA pressure.  3. The mitral valve is normal in structure. No evidence of mitral valve regurgitation. No evidence of mitral stenosis. Moderate mitral annular calcification.  4. The aortic valve is normal in structure. Aortic valve regurgitation is not visualized. No aortic stenosis is present.  5. The inferior vena cava is normal in size with greater than 50% respiratory variability, suggesting right atrial pressure of 3 mmHg. FINDINGS  Left Ventricle:  Left ventricular ejection fraction, by estimation, is 40 to 45%. The left ventricle has mildly decreased function. The left ventricle demonstrates regional wall motion abnormalities. Definity contrast agent was given IV to delineate the left ventricular endocardial borders. The left ventricular internal cavity size was normal in size. There is no left ventricular hypertrophy. Left ventricular diastolic parameters are indeterminate. Right Ventricle: The right ventricular size is normal. No increase in right ventricular wall thickness. Right ventricular systolic function is normal. Tricuspid regurgitation signal is inadequate for assessing PA pressure. Left Atrium: Left atrial size was normal in size. Right Atrium: Right atrial size was normal in size. Pericardium: There is no evidence of pericardial effusion. Mitral Valve: The mitral valve is normal  in structure. Moderate mitral annular calcification. No evidence of mitral valve regurgitation. No evidence of mitral valve stenosis. Tricuspid Valve: The tricuspid valve is normal in structure. Tricuspid valve regurgitation is not demonstrated. No evidence of tricuspid stenosis. Aortic Valve: The aortic valve is normal in structure. Aortic valve regurgitation is not visualized. No aortic stenosis is present. Aortic valve peak gradient measures 4.8 mmHg. Pulmonic Valve: The pulmonic valve was normal in structure. Pulmonic valve regurgitation is not visualized. No evidence of pulmonic stenosis. Aorta: The aortic root is normal in size and structure. Venous: The inferior vena cava is normal in size with greater than 50% respiratory variability, suggesting right atrial pressure of 3 mmHg. IAS/Shunts: No atrial level shunt detected by color flow Doppler.  LEFT VENTRICLE PLAX 2D LVIDd:         3.99 cm   Diastology LVIDs:         2.85 cm   LV e' medial:    4.03 cm/s LV PW:         1.10 cm   LV E/e' medial:  18.5 LV IVS:        1.06 cm   LV e' lateral:   4.13 cm/s LVOT diam:     2.00 cm   LV E/e' lateral: 18.1 LV SV:         79 LV SV Index:   44 LVOT Area:     3.14 cm  LEFT ATRIUM         Index LA diam:    3.30 cm 1.82 cm/m  AORTIC VALVE                 PULMONIC VALVE AV Area (Vmax): 3.00 cm     PV Vmax:       0.95 m/s AV Vmax:        109.00 cm/s  PV Peak grad:  3.6 mmHg AV Peak Grad:   4.8 mmHg LVOT Vmax:      104.00 cm/s LVOT Vmean:     69.500 cm/s LVOT VTI:       0.253 m  AORTA Ao Root diam: 2.90 cm Ao Asc diam:  2.90 cm MITRAL VALVE MV Area (PHT): 2.55 cm    SHUNTS MV Decel Time: 297 msec    Systemic VTI:  0.25 m MV E velocity: 74.60 cm/s  Systemic Diam: 2.00 cm MV A velocity: 96.80 cm/s MV E/A ratio:  0.77 Ida Rogue MD Electronically signed by Ida Rogue MD Signature Date/Time: 12/21/2021/12:50:42 PM    Final    DG Chest 2 View  Result Date: 12/20/2021 CLINICAL DATA:  Chest pain EXAM: CHEST - 2 VIEW  COMPARISON:  Chest x-ray dated November 30, 2021 FINDINGS: The heart size and mediastinal contours are within normal limits. Both lungs are  clear. The visualized skeletal structures are unremarkable. IMPRESSION: No active cardiopulmonary disease. Electronically Signed   By: Yetta Glassman M.D.   On: 12/20/2021 13:45    Assessment and Plan LISA-MARIE RUEGER is a 88 y.o. female with medical history significant of CAD, s/p of stent placement, HTN, HLD, DM, asthma, CKD-3A, A fib on Xarelto, dCHF, anal cancer (s/p of colostomy, radiation and chemotherapy), GERD, who presents with chest pain.   Patient states that her chest pain started at about 11 AM, which is located in the central chest, pressure-like, initially 7 out of 10 in severity, currently 2 out of 10 in severity, radiating to the left shoulder.  Associated with mild shortness of breath and nausea, vomiting.    NSTEMI (non-ST elevated myocardial infarction) ([place])  hx of CAD: s/p of stent. Trop 359.  Consulted Dr. Fletcher Anon of cards-- plans for cardiac catheterization 9/11 -Trend Trop--  peaked at 1200--trending down - prn Nitroglycerin, Morphine, lipitor  -continue aspirin and statin -continue IV heparin drip- - A1C was 7.2 on 11/30/21 --> will not repeat A1c today -- patient is chest pain-free   HTN (hypertension) -IV hydralazine as needed -Metoprolol   Hyperlipidemia - Lipitor and zetia   Stage 3a chronic kidney disease ([place]) Renal function stable at baseline.  Baseline creatinine 1.32 on 12/01/2021.  Her creatinine is 1.29, BUN 20, GFR 40 today. -Follow-up with BMP   Atrial fibrillation, chronic (HCC) Heart rate 76. -Continue metoprolol, amiodarone -Hold Xarelto since patient is on IV heparin   Type II diabetes mellitus with renal manifestations (HCC) Recent A1c 7.2 on 11/30/2021, poorly controlled.  -- Patient is taking Trulicity and metformin at home. -Sliding scale insulin   Chronic diastolic CHF (congestive heart failure)  ([place]) 2D echo 12/01/2021 showed EF of 55-60% with grade 1 diastolic dysfunction.  Patient does not have leg edema or JVD.  CHF seem to be compensated. -Watch volume status closely. -Check BNP --> 162.   E. coli UTI (urinary tract infection) -per urine culture August 31 as outpatient -- patient was started on PO Cipro. She took three tablets only. -- Will give Keflex 500 BID for four more days   Body mass index is 32.45 kg/m. Obesity  Procedures: Family communication : none Consults : [facility] cardiology CODE STATUS: full DVT Prophylaxis : heparin drip Level of care: Telemetry Cardiac Status is: Inpatient Remains inpatient appropriate because: non-STEMI cardiac catheterization on Monday    TOTAL TIME TAKING CARE OF THIS PATIENT: 35 minutes.  >50% time spent on counselling and coordination of care  Note: This dictation was prepared with Dragon dictation along with smaller phrase technology. Any transcriptional errors that result from this process are unintentional.  Fritzi Mandes M.D    [facility]   CC: Primary care physician; Maryland Pink, MD

## 2021-12-21 NOTE — Progress Notes (Signed)
Progress Note  Patient Name: Sarah Potts Date of Encounter: 12/21/2021  Primary Cardiologist: Rockey Situ  Subjective   High-sensitivity troponin peaked at 1264 and is currently downtrending. No further chest pain. No dyspnea, palpitations, dizziness, presyncope, or syncope.   Inpatient Medications    Scheduled Meds:  amiodarone  200 mg Oral BID   ferrous sulfate  325 mg Oral Q breakfast   And   ascorbic acid  250 mg Oral Q breakfast   atorvastatin  20 mg Oral q AM   cholecalciferol  3,000 Units Oral Q1400   ezetimibe  10 mg Oral q AM   insulin aspart  0-5 Units Subcutaneous QHS   insulin aspart  0-9 Units Subcutaneous TID WC   isosorbide mononitrate  30 mg Oral Daily   magnesium oxide  400 mg Oral Daily   metoprolol succinate  50 mg Oral Daily   mirabegron ER  50 mg Oral Daily   montelukast  10 mg Oral QHS   omega-3 acid ethyl esters  2 g Oral BID   pantoprazole  40 mg Oral Daily   pregabalin  75 mg Oral BID   Continuous Infusions:  cefTRIAXone (ROCEPHIN)  IV Stopped (12/20/21 1736)   heparin 800 Units/hr (12/20/21 1530)   PRN Meds: acetaminophen, albuterol, dextromethorphan-guaiFENesin, hydrALAZINE, morphine injection, nitroGLYCERIN, ondansetron (ZOFRAN) IV   Vital Signs    Vitals:   12/20/21 1900 12/20/21 2015 12/20/21 2153 12/21/21 0014  BP: (!) 88/53 100/61 122/75 (!) 121/59  Pulse: 65 70 69 70  Resp: '13 20 20 18  '$ Temp:   97.8 F (36.6 C) 97.8 F (36.6 C)  TempSrc:   Oral Oral  SpO2: 96% 97% 98% 99%  Weight:   80.5 kg   Height:   '5\' 2"'$  (1.575 m)     Intake/Output Summary (Last 24 hours) at 12/21/2021 0724 Last data filed at 12/21/2021 0057 Gross per 24 hour  Intake --  Output 550 ml  Net -550 ml   Filed Weights   12/20/21 1301 12/20/21 2153  Weight: 79.4 kg 80.5 kg    Telemetry    SR - Personally Reviewed  ECG    No new tracings - Personally Reviewed  Physical Exam   GEN: No acute distress.   Neck: No JVD. Cardiac: RRR, no murmurs, rubs,  or gallops.  Respiratory: Clear to auscultation bilaterally.  GI: Soft, nontender, non-distended.   MS: No edema; No deformity. Neuro:  Alert and oriented x 3; Nonfocal.  Psych: Normal affect.  Labs    Chemistry Recent Labs  Lab 12/20/21 1302 12/21/21 0506  NA 138 139  K 4.9 4.7  CL 105 106  CO2 24 24  GLUCOSE 162* 112*  BUN 20 19  CREATININE 1.29* 1.22*  CALCIUM 9.4 8.9  GFRNONAA 40* 43*  ANIONGAP 9 9     Hematology Recent Labs  Lab 12/20/21 1302 12/21/21 0506  WBC 5.0 4.4  RBC 3.86* 3.26*  HGB 12.1 10.2*  HCT 37.1 31.2*  MCV 96.1 95.7  MCH 31.3 31.3  MCHC 32.6 32.7  RDW 15.0 15.1  PLT 220 189    Cardiac EnzymesNo results for input(s): "TROPONINI" in the last 168 hours. No results for input(s): "TROPIPOC" in the last 168 hours.   BNP Recent Labs  Lab 12/20/21 1304  BNP 162.4*     DDimer No results for input(s): "DDIMER" in the last 168 hours.   Radiology    DG Chest 2 View  Result Date: 12/20/2021 IMPRESSION: No active  cardiopulmonary disease. Electronically Signed   By: Yetta Glassman M.D.   On: 12/20/2021 13:45    Cardiac Studies   2D echo 12/01/2021: 1. Left ventricular ejection fraction, by estimation, is 55 to 60%. The  left ventricle has normal function. The left ventricle has no regional  wall motion abnormalities. Left ventricular diastolic parameters are  consistent with Grade I diastolic  dysfunction (impaired relaxation).   2. Right ventricular systolic function is normal. The right ventricular  size is normal.   3. The mitral valve is normal in structure. No evidence of mitral valve  regurgitation. No evidence of mitral stenosis. Moderate mitral annular  calcification.   4. The aortic valve is normal in structure. Aortic valve regurgitation is  not visualized. Aortic valve sclerosis is present, with no evidence of  aortic valve stenosis.   5. The inferior vena cava is normal in size with greater than 50%  respiratory  variability, suggesting right atrial pressure of 3 mmHg.  Patient Profile     88 y.o. female with history of CAD status post PCI to the LCx in 2019 in [place], PAF diagnosed in 2016 on Xarelto, HFpEF, DM2, CKD stage IIIa, HTN, HLD, anemia, and anal cancer status post colostomy who is being seen today for the evaluation of chest pain and elevated troponin at the request of Dr. Blaine Hamper.  Assessment & Plan    1. CAD involving the native coronary arteries with NSTEMI: -Currently, chest pain free -High-sensitivity troponin peaked at 1264 and is currently down trending -Plan for [facility] 12/23/2021 -Continue newly started Imdur 30 mg daily -As needed morphine to achieve chest pain-free status -Hemodynamically stable -Prior to admission, she has been maintained on Xarelto in place of aspirin given underlying A-fib and in the context of minimize bleeding risk  -Xarelto held upon admission, currently on heparin drip  -Update echo  2. PAF:  -Maintaining sinus rhythm  -PTA amiodarone 200 mg daily and Toprol-XL  -Heparin drip while admitted in place of Xarelto  -CHA2DS2-VASc at least 7 (CHF, HTN, age x 2, DM, vascular disease, sex category)   3. HFpEF:  -Appears grossly euvolemic and well compensated  -Echo pending -Not requiring a standing diuretic   4. CKD stage IIIa:  -Renal function stable   5. HTN:  -Blood pressure controlled  6. HLD:  -LDL 46  -PTA atorvastatin and ezetimibe  7.  Anemia: -1.9 g drop from admission to today, consistent with prior readings -Monitor on heparin drip   Shared Decision Making/Informed Consent{  The risks [stroke (1 in 1000), death (1 in 1000), kidney failure [usually temporary] (1 in 500), bleeding (1 in 200), allergic reaction [possibly serious] (1 in 200)], benefits (diagnostic support and management of coronary artery disease) and alternatives of a cardiac catheterization were discussed in detail with Ms. Burkard and she is willing to proceed.   For  questions or updates, please contact [facility] Please consult [email or web address] for contact info under Cardiology/STEMI.    Signed, Christell Faith, PA-C [place] Pager: [phone] 12/21/2021, 7:24 AM

## 2021-12-21 NOTE — Consult Note (Signed)
[place] for Heparin infusion Indication: chest pain/ACS  Allergies  Allergen Reactions   Levofloxacin Nausea Only    Patient reports that she thinks that she could take this medication.    Niacin Other (See Comments)    Hot, Flushed   Niacin And Related Other (See Comments)    Pt states she get real red and hot.    Patient Measurements: Height: '5\' 2"'$  (157.5 cm) Weight: 80.5 kg (177 lb 6.4 oz) IBW/kg (Calculated) : 50.1 Heparin Dosing Weight: 67.7 kg  Vital Signs: Temp: 97.8 F (36.6 C) (09/09 0014) Temp Source: Oral (09/09 0014) BP: 121/59 (09/09 0014) Pulse Rate: 70 (09/09 0014)  Labs: Recent Labs    12/20/21 1302 12/20/21 1304 12/20/21 1521 12/20/21 2204 12/21/21 0106  HGB 12.1  --   --   --   --   HCT 37.1  --   --   --   --   PLT 220  --   --   --   --   APTT  --  46* >200*  --  95*  LABPROT  --  28.0* 29.5*  --   --   INR  --  2.6* 2.8*  --   --   HEPARINUNFRC  --  >1.10*  --   --   --   CREATININE 1.29*  --   --   --   --   TROPONINIHS 359*  --  1,264* 996* 657*     Estimated Creatinine Clearance: 30.8 mL/min (A) (by C-G formula based on SCr of 1.29 mg/dL (H)).   Medical History: Past Medical History:  Diagnosis Date   Anal cancer ([place])    Diabetes mellitus    Hyperlipidemia    Hypertension     Medications:  Scheduled:   amiodarone  200 mg Oral BID   ferrous sulfate  325 mg Oral Q breakfast   And   ascorbic acid  250 mg Oral Q breakfast   atorvastatin  20 mg Oral q AM   cholecalciferol  3,000 Units Oral Q1400   ezetimibe  10 mg Oral q AM   insulin aspart  0-5 Units Subcutaneous QHS   insulin aspart  0-9 Units Subcutaneous TID WC   isosorbide mononitrate  30 mg Oral Daily   magnesium oxide  400 mg Oral Daily   metoprolol succinate  50 mg Oral Daily   mirabegron ER  50 mg Oral Daily   montelukast  10 mg Oral QHS   omega-3 acid ethyl esters  2 g Oral BID   pantoprazole  40 mg Oral Daily    pregabalin  75 mg Oral BID   Infusions:   cefTRIAXone (ROCEPHIN)  IV Stopped (12/20/21 1736)   heparin 800 Units/hr (12/20/21 1530)   PRN: acetaminophen, albuterol, dextromethorphan-guaiFENesin, hydrALAZINE, morphine injection, nitroGLYCERIN, ondansetron (ZOFRAN) IV  Assessment: Sarah Potts is a 88 y.o. female presenting with chest pain and vomiting. Per patient, last dose of Xarelto was 9/8 AM. PMH significant for CAD, HTN, DM, HLD, obesity, anal cancer, AoCKD3a, Afib on Xarelto. hsTrop 359. Pharmacy has been consulted to initiate and manage heparin infusion.  Baseline Labs: aPTT 46, HL >1.10, PT 28.0, INR 2.6, Hgb 12.1, Hct 37.1, Plt 220   Goal of Therapy:  aPTT Goal: 66-102 Heparin level 0.3-0.7 units/ml Monitor platelets by anticoagulation protocol: Yes  9/9 0106 aPTT 95, therapeutic x 1  Plan:  Continue heparin infusion at 800 units/hr Recheck aPTT in 8 hours to  confirm  Monitor daily HL and aPTT until correlated then switch to daily HL monitoring Continue to monitor H&H and platelets  Thank you for allowing pharmacy to be a part of this patient's care.  Renda Rolls, PharmD, MBA 12/21/2021 2:09 AM

## 2021-12-21 NOTE — Consult Note (Signed)
[place] for Heparin infusion Indication: chest pain/ACS  Allergies  Allergen Reactions   Levofloxacin Nausea Only    Patient reports that she thinks that she could take this medication.    Niacin Other (See Comments)    Hot, Flushed   Niacin And Related Other (See Comments)    Pt states she get real red and hot.    Patient Measurements: Height: '5\' 2"'$  (157.5 cm) Weight: 80.5 kg (177 lb 6.4 oz) IBW/kg (Calculated) : 50.1 Heparin Dosing Weight: 67.7 kg  Vital Signs: Temp: 98 F (36.7 C) (09/09 0914) Temp Source: Oral (09/09 0014) BP: 125/65 (09/09 0914) Pulse Rate: 63 (09/09 0914)  Labs: Recent Labs    12/20/21 1302 12/20/21 1304 12/20/21 1304 12/20/21 1521 12/20/21 2204 12/21/21 0106 12/21/21 0506 12/21/21 0915  HGB 12.1  --   --   --   --   --  10.2*  --   HCT 37.1  --   --   --   --   --  31.2*  --   PLT 220  --   --   --   --   --  189  --   APTT  --  46*   < > >200*  --  95*  --  88*  LABPROT  --  28.0*  --  29.5*  --   --   --   --   INR  --  2.6*  --  2.8*  --   --   --   --   HEPARINUNFRC  --  >1.10*  --   --   --   --   --  >1.10*  CREATININE 1.29*  --   --   --   --   --  1.22*  --   TROPONINIHS 359*  --   --  1,264* 996* 657* 415*  --    < > = values in this interval not displayed.     Estimated Creatinine Clearance: 32.6 mL/min (A) (by C-G formula based on SCr of 1.22 mg/dL (H)).   Medical History: Past Medical History:  Diagnosis Date   Anal cancer ([place])    Diabetes mellitus    Hyperlipidemia    Hypertension     Medications:  Scheduled:   amiodarone  200 mg Oral BID   ferrous sulfate  325 mg Oral Q breakfast   And   ascorbic acid  250 mg Oral Q breakfast   atorvastatin  20 mg Oral q AM   cholecalciferol  3,000 Units Oral Q1400   ezetimibe  10 mg Oral q AM   insulin aspart  0-5 Units Subcutaneous QHS   insulin aspart  0-9 Units Subcutaneous TID WC   isosorbide mononitrate  30 mg Oral Daily    magnesium oxide  400 mg Oral Daily   metoprolol succinate  50 mg Oral Daily   mirabegron ER  50 mg Oral Daily   montelukast  10 mg Oral QHS   omega-3 acid ethyl esters  2 g Oral BID   pantoprazole  40 mg Oral Daily   pregabalin  75 mg Oral BID   Infusions:   cefTRIAXone (ROCEPHIN)  IV Stopped (12/20/21 1736)   heparin 800 Units/hr (12/20/21 1530)   PRN: acetaminophen, albuterol, dextromethorphan-guaiFENesin, hydrALAZINE, morphine injection, nitroGLYCERIN, ondansetron (ZOFRAN) IV  Assessment: Sarah Potts is a 88 y.o. female presenting with chest pain and vomiting. Per patient, last dose of Xarelto was 9/8 AM.  PMH significant for CAD, HTN, DM, HLD, obesity, anal cancer, AoCKD3a, Afib on Xarelto. hsTrop 359. Pharmacy has been consulted to initiate and manage heparin infusion.  Baseline Labs: aPTT 46, HL >1.10, PT 28.0, INR 2.6, Hgb 12.1, Hct 37.1, Plt 220   Goal of Therapy:  aPTT Goal: 66-102 Heparin level 0.3-0.7 units/ml Monitor platelets by anticoagulation protocol: Yes  9/9 0106 aPTT 95, therapeutic x 1 9/9 0915 aPTT 88, therapeutic x 2  Plan:  Continue heparin infusion at 800 units/hr Recheck aPTT with AM labs Monitor daily HL and aPTT until correlated then switch to daily HL monitoring Continue to monitor H&H and platelets  Thank you for allowing pharmacy to be a part of this patient's care.  Pearla Dubonnet, PharmD Clinical Pharmacist 12/21/2021 12:06 PM

## 2021-12-21 NOTE — Progress Notes (Signed)
*  PRELIMINARY RESULTS* Echocardiogram 2D Echocardiogram has been performed. Definity IV ultrasound imaging agent used on this study.  Sarah Potts 12/21/2021, 9:14 AM

## 2021-12-22 DIAGNOSIS — I255 Ischemic cardiomyopathy: Secondary | ICD-10-CM | POA: Diagnosis not present

## 2021-12-22 DIAGNOSIS — I480 Paroxysmal atrial fibrillation: Secondary | ICD-10-CM | POA: Diagnosis not present

## 2021-12-22 DIAGNOSIS — E1122 Type 2 diabetes mellitus with diabetic chronic kidney disease: Secondary | ICD-10-CM | POA: Diagnosis not present

## 2021-12-22 DIAGNOSIS — I10 Essential (primary) hypertension: Secondary | ICD-10-CM | POA: Diagnosis not present

## 2021-12-22 DIAGNOSIS — I25118 Atherosclerotic heart disease of native coronary artery with other forms of angina pectoris: Secondary | ICD-10-CM | POA: Diagnosis not present

## 2021-12-22 DIAGNOSIS — E782 Mixed hyperlipidemia: Secondary | ICD-10-CM | POA: Diagnosis not present

## 2021-12-22 DIAGNOSIS — I214 Non-ST elevation (NSTEMI) myocardial infarction: Secondary | ICD-10-CM | POA: Diagnosis not present

## 2021-12-22 LAB — URINE CULTURE: Culture: NO GROWTH

## 2021-12-22 LAB — APTT: aPTT: 77 seconds — ABNORMAL HIGH (ref 24–36)

## 2021-12-22 LAB — GLUCOSE, CAPILLARY
Glucose-Capillary: 182 mg/dL — ABNORMAL HIGH (ref 70–99)
Glucose-Capillary: 142 mg/dL — ABNORMAL HIGH (ref 70–99)
Glucose-Capillary: 143 mg/dL — ABNORMAL HIGH (ref 70–99)
Glucose-Capillary: 146 mg/dL — ABNORMAL HIGH (ref 70–99)

## 2021-12-22 LAB — HEPARIN LEVEL (UNFRACTIONATED): Heparin Unfractionated: 0.49 IU/mL (ref 0.30–0.70)

## 2021-12-22 MED ORDER — ORAL CARE MOUTH RINSE: 15.0000 mL | OROMUCOSAL | Status: DC | PRN

## 2021-12-22 MED ORDER — AMIODARONE HCL 200 MG PO TABS
200.0000 mg | ORAL_TABLET | Freq: Every day | ORAL | Status: DC
  Administered 2021-12-23 – 2021-12-24 (×2): 200 mg via ORAL
  Filled 2021-12-22 (×2): qty 1

## 2021-12-22 NOTE — Progress Notes (Signed)
[facility] [place] NAME: Sarah Potts    MR#:  [id number]  DATE OF BIRTH:  19-Nov-1932  SUBJECTIVE:  Pt came in with chest pain nausea vomiting and exertional dyspnea. Feels a lot better. No family at bedside. Chest pain-free. Tolerating PO diet.  VITALS:  Blood pressure 112/63, pulse 62, temperature 97.8 F (36.6 C), resp. rate 18, height '5\' 2"'$  (1.575 m), weight 79.5 kg, SpO2 97 %.  PHYSICAL EXAMINATION:   GENERAL:  88 y.o.-year-old patient lying in the bed with no acute distress. Obese LUNGS: Normal breath sounds bilaterally, no wheezing, rales, rhonchi.  CARDIOVASCULAR: S1, S2 normal. No murmurs, rubs, or gallops.  ABDOMEN: Soft, nontender, nondistended.colostomy+ EXTREMITIES: No  edema b/l.    NEUROLOGIC: nonfocal  patient is alert and awake SKIN: No obvious rash, lesion, or ulcer.   LABORATORY PANEL:  CBC Recent Labs  Lab 12/21/21 0506  WBC 4.4  HGB 10.2*  HCT 31.2*  PLT 189     Chemistries  Recent Labs  Lab 12/21/21 0506  NA 139  K 4.7  CL 106  CO2 24  GLUCOSE 112*  BUN 19  CREATININE 1.22*  CALCIUM 8.9    Cardiac Enzymes No results for input(s): "TROPONINI" in the last 168 hours. RADIOLOGY:  ECHOCARDIOGRAM COMPLETE  Result Date: 12/21/2021    ECHOCARDIOGRAM REPORT   Patient Name:   Sarah Potts Date of Exam: 12/21/2021 Medical Rec #:  [id number]      Height:       62.0 in Accession #:    [id number]     Weight:       177.4 lb Date of Birth:  08/04/1932      BSA:          1.817 m Patient Age:    25 years       BP:           121/59 mmHg Patient Gender: F              HR:           63 bpm. Exam Location:  [facility] Procedure: 2D Echo and Intracardiac Opacification Agent Indications:     NSTEMI I21.4  History:         Patient has prior history of Echocardiogram examinations, most                  recent 12/01/2021.  Sonographer:     Kathlen Brunswick RDCS Referring Phys:  [id number] [place] Diagnosing Phys: Ida Rogue MD   Sonographer Comments: Technically difficult study due to poor echo windows and suboptimal apical window. Image acquisition challenging due to patient body habitus. IMPRESSIONS  1. Left ventricular ejection fraction, by estimation, is 40 to 45%. The left ventricle has mildly decreased function. The left ventricle demonstrates regional wall motion abnormalities (hypokinesis of teh anterior/anteroseptal and apical region). Left ventricular diastolic parameters are indeterminate.  2. Right ventricular systolic function is normal. The right ventricular size is normal. Tricuspid regurgitation signal is inadequate for assessing PA pressure.  3. The mitral valve is normal in structure. No evidence of mitral valve regurgitation. No evidence of mitral stenosis. Moderate mitral annular calcification.  4. The aortic valve is normal in structure. Aortic valve regurgitation is not visualized. No aortic stenosis is present.  5. The inferior vena cava is normal in size with greater than 50% respiratory variability, suggesting right atrial pressure of 3 mmHg. FINDINGS  Left Ventricle: Left ventricular  ejection fraction, by estimation, is 40 to 45%. The left ventricle has mildly decreased function. The left ventricle demonstrates regional wall motion abnormalities. Definity contrast agent was given IV to delineate the left ventricular endocardial borders. The left ventricular internal cavity size was normal in size. There is no left ventricular hypertrophy. Left ventricular diastolic parameters are indeterminate. Right Ventricle: The right ventricular size is normal. No increase in right ventricular wall thickness. Right ventricular systolic function is normal. Tricuspid regurgitation signal is inadequate for assessing PA pressure. Left Atrium: Left atrial size was normal in size. Right Atrium: Right atrial size was normal in size. Pericardium: There is no evidence of pericardial effusion. Mitral Valve: The mitral valve is normal in  structure. Moderate mitral annular calcification. No evidence of mitral valve regurgitation. No evidence of mitral valve stenosis. Tricuspid Valve: The tricuspid valve is normal in structure. Tricuspid valve regurgitation is not demonstrated. No evidence of tricuspid stenosis. Aortic Valve: The aortic valve is normal in structure. Aortic valve regurgitation is not visualized. No aortic stenosis is present. Aortic valve peak gradient measures 4.8 mmHg. Pulmonic Valve: The pulmonic valve was normal in structure. Pulmonic valve regurgitation is not visualized. No evidence of pulmonic stenosis. Aorta: The aortic root is normal in size and structure. Venous: The inferior vena cava is normal in size with greater than 50% respiratory variability, suggesting right atrial pressure of 3 mmHg. IAS/Shunts: No atrial level shunt detected by color flow Doppler.  LEFT VENTRICLE PLAX 2D LVIDd:         3.99 cm   Diastology LVIDs:         2.85 cm   LV e' medial:    4.03 cm/s LV PW:         1.10 cm   LV E/e' medial:  18.5 LV IVS:        1.06 cm   LV e' lateral:   4.13 cm/s LVOT diam:     2.00 cm   LV E/e' lateral: 18.1 LV SV:         79 LV SV Index:   44 LVOT Area:     3.14 cm  LEFT ATRIUM         Index LA diam:    3.30 cm 1.82 cm/m  AORTIC VALVE                 PULMONIC VALVE AV Area (Vmax): 3.00 cm     PV Vmax:       0.95 m/s AV Vmax:        109.00 cm/s  PV Peak grad:  3.6 mmHg AV Peak Grad:   4.8 mmHg LVOT Vmax:      104.00 cm/s LVOT Vmean:     69.500 cm/s LVOT VTI:       0.253 m  AORTA Ao Root diam: 2.90 cm Ao Asc diam:  2.90 cm MITRAL VALVE MV Area (PHT): 2.55 cm    SHUNTS MV Decel Time: 297 msec    Systemic VTI:  0.25 m MV E velocity: 74.60 cm/s  Systemic Diam: 2.00 cm MV A velocity: 96.80 cm/s MV E/A ratio:  0.77 Ida Rogue MD Electronically signed by Ida Rogue MD Signature Date/Time: 12/21/2021/12:50:42 PM    Final    DG Chest 2 View  Result Date: 12/20/2021 CLINICAL DATA:  Chest pain EXAM: CHEST - 2 VIEW  COMPARISON:  Chest x-ray dated November 30, 2021 FINDINGS: The heart size and mediastinal contours are within normal limits. Both lungs are clear. The  visualized skeletal structures are unremarkable. IMPRESSION: No active cardiopulmonary disease. Electronically Signed   By: Yetta Glassman M.D.   On: 12/20/2021 13:45    Assessment and Plan Sarah Potts is a 88 y.o. female with medical history significant of CAD, s/p of stent placement, HTN, HLD, DM, asthma, CKD-3A, A fib on Xarelto, dCHF, anal cancer (s/p of colostomy, radiation and chemotherapy), GERD, who presents with chest pain.   Patient states that her chest pain started at about 11 AM, which is located in the central chest, pressure-like, initially 7 out of 10 in severity, currently 2 out of 10 in severity, radiating to the left shoulder.  Associated with mild shortness of breath and nausea, vomiting.    NSTEMI (non-ST elevated myocardial infarction) ([place])  hx of CAD: s/p of stent. Trop 359.  --Consulted Dr. Darlis Loan-- plans for cardiac catheterization 9/11 -Trend Trop--  peaked at 1200--trending down - prn Nitroglycerin, Morphine, lipitor  -continue aspirin and statin -continue IV heparin drip- - A1C was 7.2 on 11/30/21 -- patient is chest pain-free   HTN (hypertension) -IV hydralazine as needed -Metoprolol   Hyperlipidemia - Lipitor and zetia   Stage 3a chronic kidney disease ([place]) Renal function stable at baseline.  Baseline creatinine 1.32 on 12/01/2021.  Her creatinine is 1.29, BUN 20, GFR 40 today. -Follow-up with BMP   Atrial fibrillation, chronic (HCC) Heart rate 76. -Continue metoprolol, amiodarone -Hold Xarelto since patient is on IV heparin   Type II diabetes mellitus with renal manifestations (HCC) Recent A1c 7.2 on 11/30/2021, poorly controlled.  -- Patient is taking Trulicity and metformin at home. -Sliding scale insulin   Chronic diastolic CHF (congestive heart failure) ([place]) 2D echo 12/01/2021 showed EF of  55-60% with grade 1 diastolic dysfunction.  Patient does not have leg edema or JVD.  CHF seem to be compensated. -Watch volume status closely. -Check BNP --> 162.   E. coli UTI (urinary tract infection) -per urine culture August 31 as outpatient -- patient was started on PO Cipro. She took three tablets only. -- Will give Keflex 500 BID for four more days   Body mass index is 32.04 kg/m. Obesity  Procedures: Family communication : none Consults : Tat Momoli [facility] cardiology CODE STATUS: full DVT Prophylaxis : heparin drip Level of care: Telemetry Cardiac Status is: Inpatient Remains inpatient appropriate because: non-STEMI cardiac catheterization on Monday 9/11    TOTAL TIME TAKING CARE OF THIS PATIENT: 35 minutes.  >50% time spent on counselling and coordination of care  Note: This dictation was prepared with Dragon dictation along with smaller phrase technology. Any transcriptional errors that result from this process are unintentional.  Fritzi Mandes M.D    [facility]   CC: Primary care physician; [place] Pink, MD

## 2021-12-22 NOTE — H&P (View-Only) (Signed)
Rounding Note    Patient Name: Sarah Potts Date of Encounter: 12/22/2021  [place] Cardiologist: Ida Rogue, MD   Subjective   Reports feeling well, denies nausea, vomiting, no shortness of breath on exertion, denies angina Remains on heparin  Echocardiogram results reviewed Ejection fraction 40 to 45%, anterior, anteroseptal and apical hypokinesis New change compared to recent study August 20  Inpatient Medications    Scheduled Meds:  [START ON 12/23/2021] amiodarone  200 mg Oral Daily   ferrous sulfate  325 mg Oral Q breakfast   And   ascorbic acid  250 mg Oral Q breakfast   atorvastatin  20 mg Oral q AM   cephALEXin  500 mg Oral Q12H   cholecalciferol  3,000 Units Oral Q1400   ezetimibe  10 mg Oral q AM   insulin aspart  0-5 Units Subcutaneous QHS   insulin aspart  0-9 Units Subcutaneous TID WC   isosorbide mononitrate  30 mg Oral Daily   magnesium oxide  400 mg Oral Daily   metoprolol succinate  50 mg Oral Daily   mirabegron ER  50 mg Oral Daily   montelukast  10 mg Oral QHS   omega-3 acid ethyl esters  2 g Oral BID   pantoprazole  40 mg Oral Daily   pregabalin  75 mg Oral BID   Continuous Infusions:  heparin 800 Units/hr (12/21/21 2343)   PRN Meds: acetaminophen, albuterol, dextromethorphan-guaiFENesin, hydrALAZINE, morphine injection, nitroGLYCERIN, ondansetron (ZOFRAN) IV, mouth rinse   Vital Signs    Vitals:   12/22/21 0524 12/22/21 0526 12/22/21 0840 12/22/21 1115  BP: 139/76  122/65 112/63  Pulse: 65  63 62  Resp:   18 18  Temp: (!) 97.1 F (36.2 C)  (!) 97.4 F (36.3 C) 97.8 F (36.6 C)  TempSrc:      SpO2: 97%  97% 97%  Weight:  79.5 kg    Height:        Intake/Output Summary (Last 24 hours) at 12/22/2021 1140 Last data filed at 12/21/2021 2300 Gross per 24 hour  Intake 250.19 ml  Output --  Net 250.19 ml      12/22/2021    5:26 AM 12/20/2021    9:53 PM 12/20/2021    1:01 PM  Last 3 Weights  Weight (lbs) 175 lb 3.2  oz 177 lb 6.4 oz 175 lb  Weight (kg) 79.47 kg 80.468 kg 79.379 kg      Telemetry    Normal sinus rhythm- Personally Reviewed  ECG     - Personally Reviewed  Physical Exam   GEN: No acute distress.   Neck: No JVD Cardiac: RRR, no murmurs, rubs, or gallops.  Respiratory: Clear to auscultation bilaterally. GI: Soft, nontender, non-distended  MS: No edema; No deformity. Neuro:  Nonfocal  Psych: Normal affect   Labs    High Sensitivity Troponin:   Recent Labs  Lab 12/20/21 1302 12/20/21 1521 12/20/21 2204 12/21/21 0106 12/21/21 0506  TROPONINIHS 359* 1,264* 996* 657* 415*     Chemistry Recent Labs  Lab 12/20/21 1302 12/21/21 0506  NA 138 139  K 4.9 4.7  CL 105 106  CO2 24 24  GLUCOSE 162* 112*  BUN 20 19  CREATININE 1.29* 1.22*  CALCIUM 9.4 8.9  GFRNONAA 40* 43*  ANIONGAP 9 9    Lipids  Recent Labs  Lab 12/21/21 0506  CHOL 123  TRIG 177*  HDL 42  LDLCALC 46  CHOLHDL 2.9    Hematology  Recent Labs  Lab 12/20/21 1302 12/21/21 0506  WBC 5.0 4.4  RBC 3.86* 3.26*  HGB 12.1 10.2*  HCT 37.1 31.2*  MCV 96.1 95.7  MCH 31.3 31.3  MCHC 32.6 32.7  RDW 15.0 15.1  PLT 220 189   Thyroid No results for input(s): "TSH", "FREET4" in the last 168 hours.  BNP Recent Labs  Lab 12/20/21 1304  BNP 162.4*    DDimer No results for input(s): "DDIMER" in the last 168 hours.   Radiology    ECHOCARDIOGRAM COMPLETE  Result Date: 12/21/2021    ECHOCARDIOGRAM REPORT   Patient Name:   Sarah Potts Date of Exam: 12/21/2021 Medical Rec #:  [id number]      Height:       62.0 in Accession #:    [id number]     Weight:       177.4 lb Date of Birth:  [birth year]      BSA:          1.817 m Patient Age:    88 years       BP:           121/59 mmHg Patient Gender: F              HR:           63 bpm. Exam Location:  [facility] Procedure: 2D Echo and Intracardiac Opacification Agent Indications:     NSTEMI I21.4  History:         Patient has prior history of Echocardiogram examinations,  most                  recent 12/01/2021.  Sonographer:     Kathlen Brunswick RDCS Referring Phys:  [id number] [place] Diagnosing Phys: Ida Rogue MD  Sonographer Comments: Technically difficult study due to poor echo windows and suboptimal apical window. Image acquisition challenging due to patient body habitus. IMPRESSIONS  1. Left ventricular ejection fraction, by estimation, is 40 to 45%. The left ventricle has mildly decreased function. The left ventricle demonstrates regional wall motion abnormalities (hypokinesis of teh anterior/anteroseptal and apical region). Left ventricular diastolic parameters are indeterminate.  2. Right ventricular systolic function is normal. The right ventricular size is normal. Tricuspid regurgitation signal is inadequate for assessing PA pressure.  3. The mitral valve is normal in structure. No evidence of mitral valve regurgitation. No evidence of mitral stenosis. Moderate mitral annular calcification.  4. The aortic valve is normal in structure. Aortic valve regurgitation is not visualized. No aortic stenosis is present.  5. The inferior vena cava is normal in size with greater than 50% respiratory variability, suggesting right atrial pressure of 3 mmHg. FINDINGS  Left Ventricle: Left ventricular ejection fraction, by estimation, is 40 to 45%. The left ventricle has mildly decreased function. The left ventricle demonstrates regional wall motion abnormalities. Definity contrast agent was given IV to delineate the left ventricular endocardial borders. The left ventricular internal cavity size was normal in size. There is no left ventricular hypertrophy. Left ventricular diastolic parameters are indeterminate. Right Ventricle: The right ventricular size is normal. No increase in right ventricular wall thickness. Right ventricular systolic function is normal. Tricuspid regurgitation signal is inadequate for assessing PA pressure. Left Atrium: Left atrial size was normal in size.  Right Atrium: Right atrial size was normal in size. Pericardium: There is no evidence of pericardial effusion. Mitral Valve: The mitral valve is normal in structure. Moderate mitral annular calcification. No evidence of mitral valve regurgitation. No  evidence of mitral valve stenosis. Tricuspid Valve: The tricuspid valve is normal in structure. Tricuspid valve regurgitation is not demonstrated. No evidence of tricuspid stenosis. Aortic Valve: The aortic valve is normal in structure. Aortic valve regurgitation is not visualized. No aortic stenosis is present. Aortic valve peak gradient measures 4.8 mmHg. Pulmonic Valve: The pulmonic valve was normal in structure. Pulmonic valve regurgitation is not visualized. No evidence of pulmonic stenosis. Aorta: The aortic root is normal in size and structure. Venous: The inferior vena cava is normal in size with greater than 50% respiratory variability, suggesting right atrial pressure of 3 mmHg. IAS/Shunts: No atrial level shunt detected by color flow Doppler.  LEFT VENTRICLE PLAX 2D LVIDd:         3.99 cm   Diastology LVIDs:         2.85 cm   LV e' medial:    4.03 cm/s LV PW:         1.10 cm   LV E/e' medial:  18.5 LV IVS:        1.06 cm   LV e' lateral:   4.13 cm/s LVOT diam:     2.00 cm   LV E/e' lateral: 18.1 LV SV:         79 LV SV Index:   44 LVOT Area:     3.14 cm  LEFT ATRIUM         Index LA diam:    3.30 cm 1.82 cm/m  AORTIC VALVE                 PULMONIC VALVE AV Area (Vmax): 3.00 cm     PV Vmax:       0.95 m/s AV Vmax:        109.00 cm/s  PV Peak grad:  3.6 mmHg AV Peak Grad:   4.8 mmHg LVOT Vmax:      104.00 cm/s LVOT Vmean:     69.500 cm/s LVOT VTI:       0.253 m  AORTA Ao Root diam: 2.90 cm Ao Asc diam:  2.90 cm MITRAL VALVE MV Area (PHT): 2.55 cm    SHUNTS MV Decel Time: 297 msec    Systemic VTI:  0.25 m MV E velocity: 74.60 cm/s  Systemic Diam: 2.00 cm MV A velocity: 96.80 cm/s MV E/A ratio:  0.77 Ida Rogue MD Electronically signed by Ida Rogue  MD Signature Date/Time: 12/21/2021/12:50:42 PM    Final    DG Chest 2 View  Result Date: 12/20/2021 CLINICAL DATA:  Chest pain EXAM: CHEST - 2 VIEW COMPARISON:  Chest x-ray dated November 30, 2021 FINDINGS: The heart size and mediastinal contours are within normal limits. Both lungs are clear. The visualized skeletal structures are unremarkable. IMPRESSION: No active cardiopulmonary disease. Electronically Signed   By: Yetta Glassman M.D.   On: 12/20/2021 13:45    Cardiac Studies   Echocardiogram  1. Left ventricular ejection fraction, by estimation, is 40 to 45%. The  left ventricle has mildly decreased function. The left ventricle  demonstrates regional wall motion abnormalities (hypokinesis of teh  anterior/anteroseptal and apical region). Left  ventricular diastolic parameters are indeterminate.   2. Right ventricular systolic function is normal. The right ventricular  size is normal. Tricuspid regurgitation signal is inadequate for assessing  PA pressure.   3. The mitral valve is normal in structure. No evidence of mitral valve  regurgitation. No evidence of mitral stenosis. Moderate mitral annular  calcification.   4. The aortic valve  is normal in structure. Aortic valve regurgitation is  not visualized. No aortic stenosis is present.   5. The inferior vena cava is normal in size with greater than 50%  respiratory variability, suggesting right atrial pressure of 3 mmHg.   Patient Profile  88 y.o. female with history of CAD status post PCI to the LCx in 2019 in [place], PAF diagnosed in 2016 on Xarelto, HFpEF, DM2, CKD stage IIIa, HTN, HLD, anemia, and anal cancer status post colostomy who is being seen today for the evaluation of chest pain and non-STEMI  Assessment & Plan    Non-STEMI, Known coronary artery disease, prior PCI to the left circumflex in 2019 Presenting with anginal symptoms Peak troponin greater than 1200 Off Xarelto, on heparin infusion in preparation for cardiac  catheterization September 11 -Echocardiogram with anterior wall hypokinesis, new wall motion abnormality since recent echo December 01, 2021 -Scheduled for cardiac catheterization tomorrow morning, orders placed   Paroxysmal atrial fibrillation Continue amiodarone 200 mg daily, metoprolol, on heparin in place of Xarelto CHA2DS2-VASc at least 7 (CHF, HTN, age x 2, DM, vascular disease, sex category)  Recent hospitalization December 01, 2021 for PAF responding to amiodarone Maintaining normal sinus rhythm   History of chronic diastolic CHF Appears euvolemic   Chronic renal sufficiency stage III yea Creatinine 1.22, at her baseline   Son at the bedside, long discussion concerning recent events   Total encounter time more than 50 minutes  Greater than 50% was spent in counseling and coordination of care with the patient   For questions or updates, please contact [place] Please consult [email or web address] for contact info under        Signed, Ida Rogue, MD  12/22/2021, 11:40 AM

## 2021-12-22 NOTE — Consult Note (Signed)
Sarah Potts for Heparin infusion Indication: chest pain/ACS  Allergies  Allergen Reactions   Levofloxacin Nausea Only    Patient reports that she thinks that she could take this medication.    Niacin Other (See Comments)    Hot, Flushed   Niacin And Related Other (See Comments)    Pt states she get real red and hot.    Patient Measurements: Height: '5\' 2"'$  (157.5 cm) Weight: 79.5 kg (175 lb 3.2 oz) IBW/kg (Calculated) : 50.1 Heparin Dosing Weight: 67.7 kg  Vital Signs: Temp: 97.1 F (36.2 C) (09/10 0524) BP: 139/76 (09/10 0524) Pulse Rate: 65 (09/10 0524)  Labs: Recent Labs    12/20/21 1302 12/20/21 1304 12/20/21 1304 12/20/21 1521 12/20/21 2204 12/21/21 0106 12/21/21 0506 12/21/21 0915 12/22/21 0514  HGB 12.1  --   --   --   --   --  10.2*  --   --   HCT 37.1  --   --   --   --   --  31.2*  --   --   PLT 220  --   --   --   --   --  189  --   --   APTT  --  46*   < > >200*  --  95*  --  88* 77*  LABPROT  --  28.0*  --  29.5*  --   --   --   --   --   INR  --  2.6*  --  2.8*  --   --   --   --   --   HEPARINUNFRC  --  >1.10*  --   --   --   --   --  >1.10* 0.49  CREATININE 1.29*  --   --   --   --   --  1.22*  --   --   TROPONINIHS 359*  --   --  1,264* 996* 657* 415*  --   --    < > = values in this interval not displayed.     Estimated Creatinine Clearance: 32.3 mL/min (A) (by C-G formula based on SCr of 1.22 mg/dL (H)).   Medical History: Past Medical History:  Diagnosis Date   Anal cancer ([place])    Diabetes mellitus    Hyperlipidemia    Hypertension     Medications:  Scheduled:   amiodarone  200 mg Oral BID   ferrous sulfate  325 mg Oral Q breakfast   And   ascorbic acid  250 mg Oral Q breakfast   atorvastatin  20 mg Oral q AM   cephALEXin  500 mg Oral Q12H   cholecalciferol  3,000 Units Oral Q1400   ezetimibe  10 mg Oral q AM   insulin aspart  0-5 Units Subcutaneous QHS   insulin aspart  0-9 Units  Subcutaneous TID WC   isosorbide mononitrate  30 mg Oral Daily   magnesium oxide  400 mg Oral Daily   metoprolol succinate  50 mg Oral Daily   mirabegron ER  50 mg Oral Daily   montelukast  10 mg Oral QHS   omega-3 acid ethyl esters  2 g Oral BID   pantoprazole  40 mg Oral Daily   pregabalin  75 mg Oral BID   Infusions:   heparin 800 Units/hr (12/21/21 2343)   PRN: acetaminophen, albuterol, dextromethorphan-guaiFENesin, hydrALAZINE, morphine injection, nitroGLYCERIN, ondansetron (ZOFRAN) IV, mouth rinse  Assessment: Sarah Cal  Potts is a 88 y.o. female presenting with chest pain and vomiting. Per patient, last dose of Xarelto was 9/8 AM. PMH significant for CAD, HTN, DM, HLD, obesity, anal cancer, AoCKD3a, Afib on Xarelto. hsTrop 359. Pharmacy has been consulted to initiate and manage heparin infusion.  Baseline Labs: aPTT 46, HL >1.10, PT 28.0, INR 2.6, Hgb 12.1, Hct 37.1, Plt 220   Goal of Therapy:  aPTT Goal: 66-102 Heparin level 0.3-0.7 units/ml Monitor platelets by anticoagulation protocol: Yes  9/9 0106 aPTT 95, therapeutic x 1 9/9 0915 aPTT 88, therapeutic x 2 9/10 0514 aPTT 77, therapeutic x 3, HL 0.49 thera x 1  Plan:  Continue heparin infusion at 800 units/hr Recheck aPTT with AM labs Monitor daily HL and aPTT until correlated then switch to daily HL monitoring Continue to monitor H&H and platelets  Thank you for allowing pharmacy to be a part of this patient's care.  Renda Rolls, PharmD, [facility] 12/22/2021 6:02 AM

## 2021-12-22 NOTE — Progress Notes (Signed)
Rounding Note    Patient Name: Sarah Potts Date of Encounter: 12/22/2021  [place] Cardiologist: Ida Rogue, MD   Subjective   Reports feeling well, denies nausea, vomiting, no shortness of breath on exertion, denies angina Remains on heparin  Echocardiogram results reviewed Ejection fraction 40 to 45%, anterior, anteroseptal and apical hypokinesis New change compared to recent study August 20  Inpatient Medications    Scheduled Meds:  [START ON 12/23/2021] amiodarone  200 mg Oral Daily   ferrous sulfate  325 mg Oral Q breakfast   And   ascorbic acid  250 mg Oral Q breakfast   atorvastatin  20 mg Oral q AM   cephALEXin  500 mg Oral Q12H   cholecalciferol  3,000 Units Oral Q1400   ezetimibe  10 mg Oral q AM   insulin aspart  0-5 Units Subcutaneous QHS   insulin aspart  0-9 Units Subcutaneous TID WC   isosorbide mononitrate  30 mg Oral Daily   magnesium oxide  400 mg Oral Daily   metoprolol succinate  50 mg Oral Daily   mirabegron ER  50 mg Oral Daily   montelukast  10 mg Oral QHS   omega-3 acid ethyl esters  2 g Oral BID   pantoprazole  40 mg Oral Daily   pregabalin  75 mg Oral BID   Continuous Infusions:  heparin 800 Units/hr (12/21/21 2343)   PRN Meds: acetaminophen, albuterol, dextromethorphan-guaiFENesin, hydrALAZINE, morphine injection, nitroGLYCERIN, ondansetron (ZOFRAN) IV, mouth rinse   Vital Signs    Vitals:   12/22/21 0524 12/22/21 0526 12/22/21 0840 12/22/21 1115  BP: 139/76  122/65 112/63  Pulse: 65  63 62  Resp:   18 18  Temp: (!) 97.1 F (36.2 C)  (!) 97.4 F (36.3 C) 97.8 F (36.6 C)  TempSrc:      SpO2: 97%  97% 97%  Weight:  79.5 kg    Height:        Intake/Output Summary (Last 24 hours) at 12/22/2021 1140 Last data filed at 12/21/2021 2300 Gross per 24 hour  Intake 250.19 ml  Output --  Net 250.19 ml      12/22/2021    5:26 AM 12/20/2021    9:53 PM 12/20/2021    1:01 PM  Last 3 Weights  Weight (lbs) 175 lb 3.2  oz 177 lb 6.4 oz 175 lb  Weight (kg) 79.47 kg 80.468 kg 79.379 kg      Telemetry    Normal sinus rhythm- Personally Reviewed  ECG     - Personally Reviewed  Physical Exam   GEN: No acute distress.   Neck: No JVD Cardiac: RRR, no murmurs, rubs, or gallops.  Respiratory: Clear to auscultation bilaterally. GI: Soft, nontender, non-distended  MS: No edema; No deformity. Neuro:  Nonfocal  Psych: Normal affect   Labs    High Sensitivity Troponin:   Recent Labs  Lab 12/20/21 1302 12/20/21 1521 12/20/21 2204 12/21/21 0106 12/21/21 0506  TROPONINIHS 359* 1,264* 996* 657* 415*     Chemistry Recent Labs  Lab 12/20/21 1302 12/21/21 0506  NA 138 139  K 4.9 4.7  CL 105 106  CO2 24 24  GLUCOSE 162* 112*  BUN 20 19  CREATININE 1.29* 1.22*  CALCIUM 9.4 8.9  GFRNONAA 40* 43*  ANIONGAP 9 9    Lipids  Recent Labs  Lab 12/21/21 0506  CHOL 123  TRIG 177*  HDL 42  LDLCALC 46  CHOLHDL 2.9    Hematology  Recent Labs  Lab 12/20/21 1302 12/21/21 0506  WBC 5.0 4.4  RBC 3.86* 3.26*  HGB 12.1 10.2*  HCT 37.1 31.2*  MCV 96.1 95.7  MCH 31.3 31.3  MCHC 32.6 32.7  RDW 15.0 15.1  PLT 220 189   Thyroid No results for input(s): "TSH", "FREET4" in the last 168 hours.  BNP Recent Labs  Lab 12/20/21 1304  BNP 162.4*    DDimer No results for input(s): "DDIMER" in the last 168 hours.   Radiology    ECHOCARDIOGRAM COMPLETE  Result Date: 12/21/2021    ECHOCARDIOGRAM REPORT   Patient Name:   Sarah Potts Date of Exam: 12/21/2021 Medical Rec #:  [id number]      Height:       62.0 in Accession #:    [id number]     Weight:       177.4 lb Date of Birth:  10-Jan-1933      BSA:          1.817 m Patient Age:    88 years       BP:           121/59 mmHg Patient Gender: F              HR:           63 bpm. Exam Location:  [facility] Procedure: 2D Echo and Intracardiac Opacification Agent Indications:     NSTEMI I21.4  History:         Patient has prior history of Echocardiogram examinations,  most                  recent 12/01/2021.  Sonographer:     Kathlen Brunswick RDCS Referring Phys:  [id number] [place] Diagnosing Phys: Ida Rogue MD  Sonographer Comments: Technically difficult study due to poor echo windows and suboptimal apical window. Image acquisition challenging due to patient body habitus. IMPRESSIONS  1. Left ventricular ejection fraction, by estimation, is 40 to 45%. The left ventricle has mildly decreased function. The left ventricle demonstrates regional wall motion abnormalities (hypokinesis of teh anterior/anteroseptal and apical region). Left ventricular diastolic parameters are indeterminate.  2. Right ventricular systolic function is normal. The right ventricular size is normal. Tricuspid regurgitation signal is inadequate for assessing PA pressure.  3. The mitral valve is normal in structure. No evidence of mitral valve regurgitation. No evidence of mitral stenosis. Moderate mitral annular calcification.  4. The aortic valve is normal in structure. Aortic valve regurgitation is not visualized. No aortic stenosis is present.  5. The inferior vena cava is normal in size with greater than 50% respiratory variability, suggesting right atrial pressure of 3 mmHg. FINDINGS  Left Ventricle: Left ventricular ejection fraction, by estimation, is 40 to 45%. The left ventricle has mildly decreased function. The left ventricle demonstrates regional wall motion abnormalities. Definity contrast agent was given IV to delineate the left ventricular endocardial borders. The left ventricular internal cavity size was normal in size. There is no left ventricular hypertrophy. Left ventricular diastolic parameters are indeterminate. Right Ventricle: The right ventricular size is normal. No increase in right ventricular wall thickness. Right ventricular systolic function is normal. Tricuspid regurgitation signal is inadequate for assessing PA pressure. Left Atrium: Left atrial size was normal in size.  Right Atrium: Right atrial size was normal in size. Pericardium: There is no evidence of pericardial effusion. Mitral Valve: The mitral valve is normal in structure. Moderate mitral annular calcification. No evidence of mitral valve regurgitation. No  evidence of mitral valve stenosis. Tricuspid Valve: The tricuspid valve is normal in structure. Tricuspid valve regurgitation is not demonstrated. No evidence of tricuspid stenosis. Aortic Valve: The aortic valve is normal in structure. Aortic valve regurgitation is not visualized. No aortic stenosis is present. Aortic valve peak gradient measures 4.8 mmHg. Pulmonic Valve: The pulmonic valve was normal in structure. Pulmonic valve regurgitation is not visualized. No evidence of pulmonic stenosis. Aorta: The aortic root is normal in size and structure. Venous: The inferior vena cava is normal in size with greater than 50% respiratory variability, suggesting right atrial pressure of 3 mmHg. IAS/Shunts: No atrial level shunt detected by color flow Doppler.  LEFT VENTRICLE PLAX 2D LVIDd:         3.99 cm   Diastology LVIDs:         2.85 cm   LV e' medial:    4.03 cm/s LV PW:         1.10 cm   LV E/e' medial:  18.5 LV IVS:        1.06 cm   LV e' lateral:   4.13 cm/s LVOT diam:     2.00 cm   LV E/e' lateral: 18.1 LV SV:         79 LV SV Index:   44 LVOT Area:     3.14 cm  LEFT ATRIUM         Index LA diam:    3.30 cm 1.82 cm/m  AORTIC VALVE                 PULMONIC VALVE AV Area (Vmax): 3.00 cm     PV Vmax:       0.95 m/s AV Vmax:        109.00 cm/s  PV Peak grad:  3.6 mmHg AV Peak Grad:   4.8 mmHg LVOT Vmax:      104.00 cm/s LVOT Vmean:     69.500 cm/s LVOT VTI:       0.253 m  AORTA Ao Root diam: 2.90 cm Ao Asc diam:  2.90 cm MITRAL VALVE MV Area (PHT): 2.55 cm    SHUNTS MV Decel Time: 297 msec    Systemic VTI:  0.25 m MV E velocity: 74.60 cm/s  Systemic Diam: 2.00 cm MV A velocity: 96.80 cm/s MV E/A ratio:  0.77 Ida Rogue MD Electronically signed by Ida Rogue  MD Signature Date/Time: 12/21/2021/12:50:42 PM    Final    DG Chest 2 View  Result Date: 12/20/2021 CLINICAL DATA:  Chest pain EXAM: CHEST - 2 VIEW COMPARISON:  Chest x-ray dated November 30, 2021 FINDINGS: The heart size and mediastinal contours are within normal limits. Both lungs are clear. The visualized skeletal structures are unremarkable. IMPRESSION: No active cardiopulmonary disease. Electronically Signed   By: Yetta Glassman M.D.   On: 12/20/2021 13:45    Cardiac Studies   Echocardiogram  1. Left ventricular ejection fraction, by estimation, is 40 to 45%. The  left ventricle has mildly decreased function. The left ventricle  demonstrates regional wall motion abnormalities (hypokinesis of teh  anterior/anteroseptal and apical region). Left  ventricular diastolic parameters are indeterminate.   2. Right ventricular systolic function is normal. The right ventricular  size is normal. Tricuspid regurgitation signal is inadequate for assessing  PA pressure.   3. The mitral valve is normal in structure. No evidence of mitral valve  regurgitation. No evidence of mitral stenosis. Moderate mitral annular  calcification.   4. The aortic valve  is normal in structure. Aortic valve regurgitation is  not visualized. No aortic stenosis is present.   5. The inferior vena cava is normal in size with greater than 50%  respiratory variability, suggesting right atrial pressure of 3 mmHg.   Patient Profile  88 y.o. female with history of CAD status post PCI to the LCx in 2019 in [place], PAF diagnosed in 2016 on Xarelto, HFpEF, DM2, CKD stage IIIa, HTN, HLD, anemia, and anal cancer status post colostomy who is being seen today for the evaluation of chest pain and non-STEMI  Assessment & Plan    Non-STEMI, Known coronary artery disease, prior PCI to the left circumflex in 2019 Presenting with anginal symptoms Peak troponin greater than 1200 Off Xarelto, on heparin infusion in preparation for cardiac  catheterization September 11 -Echocardiogram with anterior wall hypokinesis, new wall motion abnormality since recent echo December 01, 2021 -Scheduled for cardiac catheterization tomorrow morning, orders placed   Paroxysmal atrial fibrillation Continue amiodarone 200 mg daily, metoprolol, on heparin in place of Xarelto CHA2DS2-VASc at least 7 (CHF, HTN, age x 2, DM, vascular disease, sex category)  Recent hospitalization December 01, 2021 for PAF responding to amiodarone Maintaining normal sinus rhythm   History of chronic diastolic CHF Appears euvolemic   Chronic renal sufficiency stage III yea Creatinine 1.22, at her baseline   Son at the bedside, long discussion concerning recent events   Total encounter time more than 50 minutes  Greater than 50% was spent in counseling and coordination of care with the patient   For questions or updates, please contact [place] Please consult [email or web address] for contact info under        Signed, Ida Rogue, MD  12/22/2021, 11:40 AM

## 2021-12-23 ENCOUNTER — Other Ambulatory Visit: Payer: Self-pay | Admitting: Obstetrics and Gynecology

## 2021-12-23 ENCOUNTER — Encounter
Admission: EM | Disposition: A | Discharge status: Home / Self Care | Payer: Self-pay | Source: Home / Self Care | Attending: Internal Medicine

## 2021-12-23 ENCOUNTER — Encounter: Payer: Self-pay | Admitting: Cardiovascular Disease

## 2021-12-23 DIAGNOSIS — I480 Paroxysmal atrial fibrillation: Secondary | ICD-10-CM | POA: Diagnosis not present

## 2021-12-23 DIAGNOSIS — I10 Essential (primary) hypertension: Secondary | ICD-10-CM | POA: Diagnosis not present

## 2021-12-23 DIAGNOSIS — I214 Non-ST elevation (NSTEMI) myocardial infarction: Secondary | ICD-10-CM | POA: Diagnosis not present

## 2021-12-23 DIAGNOSIS — E1122 Type 2 diabetes mellitus with diabetic chronic kidney disease: Secondary | ICD-10-CM | POA: Diagnosis not present

## 2021-12-23 DIAGNOSIS — I2510 Atherosclerotic heart disease of native coronary artery without angina pectoris: Secondary | ICD-10-CM

## 2021-12-23 HISTORY — PX: LEFT HEART CATH AND CORONARY ANGIOGRAPHY: CATH118249

## 2021-12-23 LAB — CBC
HCT: 31 % — ABNORMAL LOW (ref 36.0–46.0)
Hemoglobin: 10.4 g/dL — ABNORMAL LOW (ref 12.0–15.0)
MCH: 31.9 pg (ref 26.0–34.0)
MCHC: 33.5 g/dL (ref 30.0–36.0)
MCV: 95.1 fL (ref 80.0–100.0)
Platelets: 185 10*3/uL (ref 150–400)
RBC: 3.26 MIL/uL — ABNORMAL LOW (ref 3.87–5.11)
RDW: 14.6 % (ref 11.5–15.5)
WBC: 3.3 10*3/uL — ABNORMAL LOW (ref 4.0–10.5)
nRBC: 0 % (ref 0.0–0.2)

## 2021-12-23 LAB — GLUCOSE, CAPILLARY
Glucose-Capillary: 131 mg/dL — ABNORMAL HIGH (ref 70–99)
Glucose-Capillary: 158 mg/dL — ABNORMAL HIGH (ref 70–99)

## 2021-12-23 LAB — APTT: aPTT: 72 seconds — ABNORMAL HIGH (ref 24–36)

## 2021-12-23 LAB — HEPARIN LEVEL (UNFRACTIONATED): Heparin Unfractionated: 0.35 IU/mL (ref 0.30–0.70)

## 2021-12-23 SURGERY — LEFT HEART CATH AND CORONARY ANGIOGRAPHY
Anesthesia: Moderate Sedation

## 2021-12-23 MED ORDER — IOHEXOL 300 MG/ML  SOLN
INTRAMUSCULAR | Status: DC | PRN
  Administered 2021-12-23: 33 mL

## 2021-12-23 MED ORDER — SODIUM CHLORIDE 0.9% FLUSH
3.0000 mL | Freq: Two times a day (BID) | INTRAVENOUS | Status: DC
  Administered 2021-12-23 – 2021-12-24 (×3): 3 mL via INTRAVENOUS

## 2021-12-23 MED ORDER — HEPARIN SODIUM (PORCINE) 1000 UNIT/ML IJ SOLN
INTRAMUSCULAR | Status: AC
  Filled 2021-12-23: qty 10

## 2021-12-23 MED ORDER — SODIUM CHLORIDE 0.9% FLUSH: 3.0000 mL | Freq: Two times a day (BID) | INTRAVENOUS | Status: DC

## 2021-12-23 MED ORDER — FENTANYL CITRATE (PF) 100 MCG/2ML IJ SOLN
INTRAMUSCULAR | Status: AC
  Filled 2021-12-23: qty 2

## 2021-12-23 MED ORDER — VERAPAMIL HCL 2.5 MG/ML IV SOLN
INTRAVENOUS | Status: DC | PRN
  Administered 2021-12-23: 2.5 mg via INTRA_ARTERIAL

## 2021-12-23 MED ORDER — MIDAZOLAM HCL 2 MG/2ML IJ SOLN
INTRAMUSCULAR | Status: DC | PRN
  Administered 2021-12-23: 1 mg via INTRAVENOUS

## 2021-12-23 MED ORDER — SODIUM CHLORIDE 0.9 % IV SOLN: INTRAVENOUS | Status: DC

## 2021-12-23 MED ORDER — SODIUM CHLORIDE 0.9 % WEIGHT BASED INFUSION: 1.0000 mL/kg/h | INTRAVENOUS | Status: DC

## 2021-12-23 MED ORDER — ASPIRIN 81 MG PO CHEW
CHEWABLE_TABLET | ORAL | Status: AC
  Filled 2021-12-23: qty 1

## 2021-12-23 MED ORDER — LIDOCAINE HCL (PF) 1 % IJ SOLN
INTRAMUSCULAR | Status: DC | PRN
  Administered 2021-12-23: 2 mL

## 2021-12-23 MED ORDER — ASPIRIN 81 MG PO CHEW: 81.0000 mg | CHEWABLE_TABLET | Freq: Once | ORAL | Status: DC

## 2021-12-23 MED ORDER — SODIUM CHLORIDE 0.9% FLUSH: 3.0000 mL | INTRAVENOUS | Status: DC | PRN

## 2021-12-23 MED ORDER — SODIUM CHLORIDE 0.9 % IV SOLN
250.0000 mL | INTRAVENOUS | Status: DC | PRN
Start: 2021-12-23 — End: 2021-12-24

## 2021-12-23 MED ORDER — SODIUM CHLORIDE 0.9 % IV SOLN: 250.0000 mL | INTRAVENOUS | Status: DC | PRN

## 2021-12-23 MED ORDER — HEPARIN (PORCINE) IN NACL 1000-0.9 UT/500ML-% IV SOLN
INTRAVENOUS | Status: AC
  Filled 2021-12-23: qty 1000

## 2021-12-23 MED ORDER — HEPARIN (PORCINE) IN NACL 1000-0.9 UT/500ML-% IV SOLN
INTRAVENOUS | Status: DC | PRN
  Administered 2021-12-23: 1000 mL

## 2021-12-23 MED ORDER — HEPARIN SODIUM (PORCINE) 1000 UNIT/ML IJ SOLN
INTRAMUSCULAR | Status: DC | PRN
  Administered 2021-12-23: 3000 [IU] via INTRAVENOUS

## 2021-12-23 MED ORDER — RIVAROXABAN 15 MG PO TABS
15.0000 mg | ORAL_TABLET | Freq: Every day | ORAL | Status: DC
  Administered 2021-12-23: 15 mg via ORAL
  Filled 2021-12-23: qty 1

## 2021-12-23 MED ORDER — VERAPAMIL HCL 2.5 MG/ML IV SOLN
INTRAVENOUS | Status: AC
  Filled 2021-12-23: qty 2

## 2021-12-23 MED ORDER — MIDAZOLAM HCL 2 MG/2ML IJ SOLN
INTRAMUSCULAR | Status: AC
  Filled 2021-12-23: qty 2

## 2021-12-23 MED ORDER — SODIUM CHLORIDE 0.9 % WEIGHT BASED INFUSION
3.0000 mL/kg/h | INTRAVENOUS | Status: DC
  Administered 2021-12-23: 3 mL/kg/h via INTRAVENOUS

## 2021-12-23 MED ORDER — LIDOCAINE HCL 1 % IJ SOLN
INTRAMUSCULAR | Status: AC
  Filled 2021-12-23: qty 20

## 2021-12-23 SURGICAL SUPPLY — 14 items
BAND ZEPHYR COMPRESS 30 LONG (HEMOSTASIS) IMPLANT
CATH INFINITI 5 FR JL3.5 (CATHETERS) IMPLANT
CATH INFINITI 5FR JK (CATHETERS) IMPLANT
DRAPE BRACHIAL (DRAPES) IMPLANT
GLIDESHEATH SLEND SS 6F .021 (SHEATH) IMPLANT
GUIDEWIRE ANGLED .035 180CM (WIRE) IMPLANT
GUIDEWIRE INQWIRE 1.5J.035X260 (WIRE) IMPLANT
INQWIRE 1.5J .035X260CM (WIRE) ×1
KIT SYRINGE INJ CVI SPIKEX1 (MISCELLANEOUS) IMPLANT
PACK CARDIAC CATH (CUSTOM PROCEDURE TRAY) ×1 IMPLANT
PROTECTION STATION PRESSURIZED (MISCELLANEOUS) ×1
SET ATX SIMPLICITY (MISCELLANEOUS) IMPLANT
STATION PROTECTION PRESSURIZED (MISCELLANEOUS) IMPLANT
WIRE HITORQ VERSACORE ST 145CM (WIRE) IMPLANT

## 2021-12-23 NOTE — Progress Notes (Signed)
Rounding Note    Patient Name: Sarah Potts Date of Encounter: 12/23/2021  [place] Cardiologist: Ida Rogue, MD   Subjective   No chest pain or shortness of breath.  Cardiac catheterization showed patent left circumflex stent with mild to moderate nonobstructive coronary artery disease.  I reviewed her echocardiogram images and she likely has stress-induced cardiomyopathy.  Inpatient Medications    Scheduled Meds:  amiodarone  200 mg Oral Daily   ferrous sulfate  325 mg Oral Q breakfast   And   ascorbic acid  250 mg Oral Q breakfast   aspirin  81 mg Oral Once   atorvastatin  20 mg Oral q AM   cephALEXin  500 mg Oral Q12H   cholecalciferol  3,000 Units Oral Q1400   ezetimibe  10 mg Oral q AM   insulin aspart  0-5 Units Subcutaneous QHS   insulin aspart  0-9 Units Subcutaneous TID WC   isosorbide mononitrate  30 mg Oral Daily   magnesium oxide  400 mg Oral Daily   metoprolol succinate  50 mg Oral Daily   mirabegron ER  50 mg Oral Daily   montelukast  10 mg Oral QHS   omega-3 acid ethyl esters  2 g Oral BID   pantoprazole  40 mg Oral Daily   pregabalin  75 mg Oral BID   rivaroxaban  15 mg Oral Q supper   sodium chloride flush  3 mL Intravenous Q12H   Continuous Infusions:  sodium chloride     PRN Meds: sodium chloride, acetaminophen, albuterol, dextromethorphan-guaiFENesin, nitroGLYCERIN, ondansetron (ZOFRAN) IV, mouth rinse, sodium chloride flush   Vital Signs    Vitals:   12/23/21 0930 12/23/21 1000 12/23/21 1030 12/23/21 1223  BP: (!) 149/72 131/69 121/81 (!) 122/58  Pulse: 60 61 (!) 58 61  Resp: '15 15 13 19  '$ Temp:    97.6 F (36.4 C)  TempSrc:      SpO2: 96% 95% 92% 98%  Weight:      Height:        Intake/Output Summary (Last 24 hours) at 12/23/2021 1500 Last data filed at 12/23/2021 0104 Gross per 24 hour  Intake 207.81 ml  Output --  Net 207.81 ml       12/23/2021    5:00 AM 12/22/2021    5:26 AM 12/20/2021    9:53 PM  Last 3  Weights  Weight (lbs) 172 lb 14.4 oz 175 lb 3.2 oz 177 lb 6.4 oz  Weight (kg) 78.427 kg 79.47 kg 80.468 kg      Telemetry    Normal sinus rhythm- Personally Reviewed  ECG     - Personally Reviewed  Physical Exam   GEN: No acute distress.   Neck: No JVD Cardiac: RRR, no murmurs, rubs, or gallops.  Respiratory: Clear to auscultation bilaterally. GI: Soft, nontender, non-distended  MS: No edema; No deformity. Neuro:  Nonfocal  Psych: Normal affect  Right radial pulses normal.  No hematoma.  There is mild tenderness in that area.  Labs    High Sensitivity Troponin:   Recent Labs  Lab 12/20/21 1302 12/20/21 1521 12/20/21 2204 12/21/21 0106 12/21/21 0506  TROPONINIHS 359* 1,264* 996* 657* 415*      Chemistry Recent Labs  Lab 12/20/21 1302 12/21/21 0506  NA 138 139  K 4.9 4.7  CL 105 106  CO2 24 24  GLUCOSE 162* 112*  BUN 20 19  CREATININE 1.29* 1.22*  CALCIUM 9.4 8.9  GFRNONAA 40* 43*  ANIONGAP 9 9     Lipids  Recent Labs  Lab 12/21/21 0506  CHOL 123  TRIG 177*  HDL 42  LDLCALC 46  CHOLHDL 2.9     Hematology Recent Labs  Lab 12/20/21 1302 12/21/21 0506 12/23/21 0615  WBC 5.0 4.4 3.3*  RBC 3.86* 3.26* 3.26*  HGB 12.1 10.2* 10.4*  HCT 37.1 31.2* 31.0*  MCV 96.1 95.7 95.1  MCH 31.3 31.3 31.9  MCHC 32.6 32.7 33.5  RDW 15.0 15.1 14.6  PLT 220 189 185    Thyroid No results for input(s): "TSH", "FREET4" in the last 168 hours.  BNP Recent Labs  Lab 12/20/21 1304  BNP 162.4*     DDimer No results for input(s): "DDIMER" in the last 168 hours.   Radiology    CARDIAC CATHETERIZATION  Result Date: 12/23/2021   Ost RCA lesion is 30% stenosed.   Prox RCA lesion is 40% stenosed.   Dist RCA lesion is 20% stenosed.   Mid Cx to Dist Cx lesion is 10% stenosed.   Prox Cx lesion is 40% stenosed.   Prox LAD to Mid LAD lesion is 50% stenosed.   Ost LAD to Prox LAD lesion is 40% stenosed. 1.  Mild to moderate nonobstructive coronary artery disease.   Patent left circumflex stent with no significant restenosis.  Moderately to severely calcified coronary arteries. 2.  Left ventricular angiography was not performed due to chronic kidney disease.  EF was mildly reduced by echo.  Mildly elevated left ventricular end-diastolic pressure. Recommendations: No clear culprit is identified for elevated troponin.  Possible stress-induced cardiomyopathy.  Continue medical therapy.  Resume Xarelto later today. The patient can likely be discharged home in the afternoon if she remains stable. 33 mL of contrast was used for the procedure.    Cardiac Studies   Echocardiogram  1. Left ventricular ejection fraction, by estimation, is 40 to 45%. The  left ventricle has mildly decreased function. The left ventricle  demonstrates regional wall motion abnormalities (hypokinesis of teh  anterior/anteroseptal and apical region). Left  ventricular diastolic parameters are indeterminate.   2. Right ventricular systolic function is normal. The right ventricular  size is normal. Tricuspid regurgitation signal is inadequate for assessing  PA pressure.   3. The mitral valve is normal in structure. No evidence of mitral valve  regurgitation. No evidence of mitral stenosis. Moderate mitral annular  calcification.   4. The aortic valve is normal in structure. Aortic valve regurgitation is  not visualized. No aortic stenosis is present.   5. The inferior vena cava is normal in size with greater than 50%  respiratory variability, suggesting right atrial pressure of 3 mmHg.   Patient Profile  88 y.o. female with history of CAD status post PCI to the LCx in 2019 in [place], PAF diagnosed in 2016 on Xarelto, HFpEF, DM2, CKD stage IIIa, HTN, HLD, anemia, and anal cancer status post colostomy who is being seen today for the evaluation of chest pain and non-STEMI  Assessment & Plan    Elevated troponin: Likely due to stress-induced cardiomyopathy Known coronary artery disease,  prior PCI to the left circumflex in 2019 Presenting with anginal symptoms Peak troponin greater than 1200 -Echocardiogram with anterior wall hypokinesis, new wall motion abnormality since recent echo December 01, 2021 -Cardiac catheterization was done today and showed patent left circumflex stent with mild to moderate nonobstructive coronary artery disease.  No culprit is identified for elevated troponin.  Suspect that her presentation is due to stress-induced  cardiomyopathy.   Paroxysmal atrial fibrillation Continue amiodarone 200 mg daily, metoprolol. Maintaining normal sinus rhythm. Xarelto can be resumed this evening.   History of chronic diastolic CHF Appears euvolemic   Chronic renal sufficiency stage III  Creatinine 1.22, at her baseline Only 33 mL of contrast was used for cardiac cath procedure.  I think it is reasonable to monitor the patient 1 more night in the hospital and likely discharge home tomorrow.    For questions or updates, please contact [place] Please consult [email or web address] for contact info under        Signed, Kathlyn [place], MD  12/23/2021, 3:00 PM

## 2021-12-23 NOTE — Care Management Important Message (Signed)
Important Message  Patient Details  Name: Sarah Potts MRN: [id number] Date of Birth: 08-Jun-1932   Medicare Important Message Given:  Yes     Loann Quill 12/23/2021, 3:48 PM

## 2021-12-23 NOTE — Progress Notes (Signed)
Mobility Specialist - Progress Note   12/23/21 1358  Mobility  Activity Ambulated with assistance in hallway;Stood at bedside;Dangled on edge of bed  Level of Assistance Minimal assist, patient does 75% or more  Assistive Device Front wheel walker  Distance Ambulated (ft) 160 ft  Activity Response Tolerated well  $Mobility charge 1 Mobility   Pt sitting upright in bed on RA upon arrival. Pt STS MinA and ambulates 1 lap around NS SBA. Pt able to ambulate within restroom and perform pericare indep. Pt returns to recliner with needs in reach, family in room, and chair alarm set.   Gretchen Short  Mobility Specialist  12/23/21 2:00 PM

## 2021-12-23 NOTE — Progress Notes (Signed)
Sarah Potts at Sarah Potts NAME: Sarah Potts    MR#:  [id number]  DATE OF BIRTH:  01/31/1933  SUBJECTIVE:  Pt came in with chest pain nausea vomiting and exertional dyspnea. Feels a lot better. No family at bedside. Chest pain-free  Patient is post cardiac cath. She ambulated in the hallway. Son at bedside.  VITALS:  Blood pressure (!) 131/53, pulse 65, temperature 98.4 F (36.9 C), resp. rate 18, height '5\' 2"'$  (1.575 m), weight 78.4 kg, SpO2 98 %.  PHYSICAL EXAMINATION:   GENERAL:  88 y.o.-year-old patient lying in the bed with no acute distress. Obese LUNGS: Normal breath sounds bilaterally, no wheezing, rales, rhonchi.  CARDIOVASCULAR: S1, S2 normal. No murmurs, rubs, or gallops.  ABDOMEN: Soft, nontender, nondistended. Colostomy+ EXTREMITIES: No  edema b/l.    NEUROLOGIC: nonfocal  patient is alert and awake SKIN: No obvious rash, lesion, or ulcer.   LABORATORY PANEL:  CBC Recent Labs  Lab 12/23/21 0615  WBC 3.3*  HGB 10.4*  HCT 31.0*  PLT 185     Chemistries  Recent Labs  Lab 12/21/21 0506  NA 139  K 4.7  CL 106  CO2 24  GLUCOSE 112*  BUN 19  CREATININE 1.22*  CALCIUM 8.9    Cardiac Enzymes No results for input(s): "TROPONINI" in the last 168 hours. RADIOLOGY:  CARDIAC CATHETERIZATION  Result Date: 12/23/2021   Ost RCA lesion is 30% stenosed.   Prox RCA lesion is 40% stenosed.   Dist RCA lesion is 20% stenosed.   Mid Cx to Dist Cx lesion is 10% stenosed.   Prox Cx lesion is 40% stenosed.   Prox LAD to Mid LAD lesion is 50% stenosed.   Ost LAD to Prox LAD lesion is 40% stenosed. 1.  Mild to moderate nonobstructive coronary artery disease.  Patent left circumflex stent with no significant restenosis.  Moderately to severely calcified coronary arteries. 2.  Left ventricular angiography was not performed due to chronic kidney disease.  EF was mildly reduced by echo.  Mildly elevated left ventricular end-diastolic pressure.  Recommendations: No clear culprit is identified for elevated troponin.  Possible stress-induced cardiomyopathy.  Continue medical therapy.  Resume Xarelto later today. The patient can likely be discharged home in the afternoon if she remains stable. 33 mL of contrast was used for the procedure.    Assessment and Plan Sarah Potts is a 88 y.o. female with medical history significant of CAD, s/p of stent placement, HTN, HLD, DM, asthma, CKD-3A, A fib on Xarelto, dCHF, anal cancer (s/p of colostomy, radiation and chemotherapy), GERD, who presents with chest pain.   Patient states that her chest pain started at about 11 AM, which is located in the central chest, pressure-like, initially 7 out of 10 in severity, currently 2 out of 10 in severity, radiating to the left shoulder.  Associated with mild shortness of breath and nausea, vomiting.    NSTEMI (non-ST elevated myocardial infarction) ([place])  hx of CAD: s/p of stent. Trop 359.  --Consulted Dr. Darlis Loan-- plans for cardiac catheterization 9/11 -Trend Trop--  peaked at 1200--trending down - prn Nitroglycerin, Morphine, lipitor  -continue aspirin and statin -IV heparin drip--now d/ced - A1C was 7.2 on 11/30/21 -- patient is chest pain-free --9/11 Cardiac catheterization was done today and showed patent left circumflex stent with mild to moderate nonobstructive coronary artery disease.  No culprit is identified for elevated troponin.  Suspect that her presentation is due to  stress-induced cardiomyopathy.  HTN (hypertension) -IV hydralazine as needed -Metoprolol   Hyperlipidemia - Lipitor and zetia   Stage 3a chronic kidney disease ([place]) Renal function stable at baseline.  Baseline creatinine 1.32 on 12/01/2021.  Her creatinine is 1.29, BUN 20, GFR 40 today. -Follow-up with BMP   Atrial fibrillation, chronic (HCC) Heart rate 76. -Continue metoprolol, amiodarone -resume Xarelto per Dr. Fletcher Anon today   Type II diabetes mellitus with renal  manifestations ([place]) Recent A1c 7.2 on 11/30/2021, poorly controlled.  -- Patient is taking Trulicity and metformin at home. -Sliding scale insulin   Chronic diastolic CHF (congestive heart failure) ([place]) 2D echo 12/01/2021 showed EF of 55-60% with grade 1 diastolic dysfunction.  Patient does not have leg edema or JVD.  CHF seem to be compensated. -Watch volume status closely. -Check BNP --> 162.   E. coli UTI (urinary tract infection) -per urine culture August 31 as outpatient -- patient was started on PO Cipro. She took three tablets only. -- Will give Keflex 500 BID for four more days   Body mass index is 31.62 kg/m. Obesity  Procedures: cardiac cath Family communication : son at bedside Consults : [facility] cardiology CODE STATUS: full DVT Prophylaxis : Xarelto Level of care: Telemetry Cardiac Status is: Inpatient Remains inpatient appropriate because: non-STEMI cardiac catheterization on Monday 9/11 will monitor for one more day. Remains stable discharge tomorrow.  TOTAL TIME TAKING CARE OF THIS PATIENT: 35 minutes.  >50% time spent on counselling and coordination of care  Note: This dictation was prepared with Dragon dictation along with smaller phrase technology. Any transcriptional errors that result from this process are unintentional.  Sarah Potts M.D    [facility]   CC: Primary care physician; [place] Pink, MD

## 2021-12-23 NOTE — Consult Note (Signed)
[facility] for Heparin infusion Indication: NSTEMI  Allergies  Allergen Reactions   Levofloxacin Nausea Only    Patient reports that she thinks that she could take this medication.    Niacin Other (See Comments)    Hot, Flushed   Niacin And Related Other (See Comments)    Pt states she get real red and hot.    Patient Measurements: Height: '5\' 2"'$  (157.5 cm) Weight: 78.4 kg (172 lb 14.4 oz) IBW/kg (Calculated) : 50.1 Heparin Dosing Weight: 67.7 kg  Vital Signs: Temp: 97.7 F (36.5 C) (09/11 0713) Temp Source: Oral (09/11 0713) BP: 156/85 (09/11 0845) Pulse Rate: 69 (09/11 0845)  Labs: Recent Labs    12/20/21 1302 12/20/21 1304 12/20/21 1304 12/20/21 1521 12/20/21 2204 12/21/21 0106 12/21/21 0506 12/21/21 0915 12/22/21 0514 12/23/21 0615  HGB 12.1  --   --   --   --   --  10.2*  --   --  10.4*  HCT 37.1  --   --   --   --   --  31.2*  --   --  31.0*  PLT 220  --   --   --   --   --  189  --   --  185  APTT  --  46*   < > >200*  --  95*  --  88* 77* 72*  LABPROT  --  28.0*  --  29.5*  --   --   --   --   --   --   INR  --  2.6*  --  2.8*  --   --   --   --   --   --   HEPARINUNFRC  --  >1.10*   < >  --   --   --   --  >1.10* 0.49 0.35  CREATININE 1.29*  --   --   --   --   --  1.22*  --   --   --   TROPONINIHS 359*  --   --  1,264* 996* 657* 415*  --   --   --    < > = values in this interval not displayed.     Estimated Creatinine Clearance: 32.1 mL/min (A) (by C-G formula based on SCr of 1.22 mg/dL (H)).   Medical History: Past Medical History:  Diagnosis Date   Anal cancer ([place])    Diabetes mellitus    Hyperlipidemia    Hypertension     Medications:  Scheduled:   [MAR Hold] amiodarone  200 mg Oral Daily   [MAR Hold] ferrous sulfate  325 mg Oral Q breakfast   And   [MAR Hold] ascorbic acid  250 mg Oral Q breakfast   aspirin       [MAR Hold] aspirin  81 mg Oral Once   [MAR Hold] atorvastatin  20 mg Oral q AM    [MAR Hold] cephALEXin  500 mg Oral Q12H   [MAR Hold] cholecalciferol  3,000 Units Oral Q1400   [MAR Hold] ezetimibe  10 mg Oral q AM   [MAR Hold] insulin aspart  0-5 Units Subcutaneous QHS   [MAR Hold] insulin aspart  0-9 Units Subcutaneous TID WC   [MAR Hold] isosorbide mononitrate  30 mg Oral Daily   [MAR Hold] magnesium oxide  400 mg Oral Daily   [MAR Hold] metoprolol succinate  50 mg Oral Daily   [MAR Hold] mirabegron ER  50  mg Oral Daily   [MAR Hold] montelukast  10 mg Oral QHS   [MAR Hold] omega-3 acid ethyl esters  2 g Oral BID   [MAR Hold] pantoprazole  40 mg Oral Daily   [MAR Hold] pregabalin  75 mg Oral BID   rivaroxaban  15 mg Oral Q supper   sodium chloride flush  3 mL Intravenous Q12H   Infusions:   sodium chloride     sodium chloride     PRN: sodium chloride, [MAR Hold] acetaminophen, [MAR Hold] albuterol, aspirin, [MAR Hold] dextromethorphan-guaiFENesin, [MAR Hold] hydrALAZINE, [MAR Hold]  morphine injection, [MAR Hold] nitroGLYCERIN, [MAR Hold] ondansetron (ZOFRAN) IV, [MAR Hold] mouth rinse, sodium chloride flush, verapamil  Assessment: HANNA RA is a 88 y.o. female presenting with chest pain and vomiting, now diagnosed with NSTEMI. Per patient, last dose of Xarelto was 9/8 AM. PMH significant for CAD, HTN, DM, HLD, obesity, anal cancer, AoCKD3a, Afib on Xarelto. hsTrop >1200 >> 359. ECHO shows anterior wall hypokinesis and new wall motion abnormality since last ECHO on 12/01/2021. Pt has gone to cath lab for coronary angiography on morning of 9/11. Hgb, Hct, and PLT are all stable.  Baseline Labs: aPTT 46, HL >1.10, PT 28.0, INR 2.6, Hgb 12.1, Hct 37.1, Plt 220   Goal of Therapy:  aPTT Goal: 66-102 Heparin level 0.3-0.7 units/ml Monitor platelets by anticoagulation protocol: Yes  9/9 0106 aPTT 95, therapeutic x 1 9/9 0915 aPTT 88, therapeutic x 2 9/10 0514 aPTT 77, therapeutic x 3, HL 0.49 thera x 1 9/11 0615 aPTT 72, therapeutic x 4, HL 0.35 thera x  2  Plan:  Continue heparin infusion at 800 units/hr Now that HL and aPTT are now correlating, switch to daily HL monitoring Monitor CBC daily while on heparin  Thank you for allowing pharmacy to be a part of this patient's care.  Dara Hoyer, PharmD PGY-1 Pharmacy Resident 12/23/2021 9:27 AM

## 2021-12-23 NOTE — Interval H&P Note (Signed)
History and Physical Interval Note:  12/23/2021 7:46 AM  Sarah Potts  has presented today for surgery, with the diagnosis of non ST segment myocardial infarction.  The various methods of treatment have been discussed with the patient and family. After consideration of risks, benefits and other options for treatment, the patient has consented to  Procedure(s): LEFT HEART CATH AND CORONARY ANGIOGRAPHY (N/A) as a surgical intervention.  The patient's history has been reviewed, patient examined, no change in status, stable for surgery.  I have reviewed the patient's chart and labs.  Questions were answered to the patient's satisfaction.     Kathlyn Sacramento

## 2021-12-24 DIAGNOSIS — I214 Non-ST elevation (NSTEMI) myocardial infarction: Secondary | ICD-10-CM | POA: Diagnosis not present

## 2021-12-24 DIAGNOSIS — I5181 Takotsubo syndrome: Secondary | ICD-10-CM

## 2021-12-24 LAB — BASIC METABOLIC PANEL
Anion gap: 8 (ref 5–15)
BUN: 20 mg/dL (ref 8–23)
CO2: 25 mmol/L (ref 22–32)
Calcium: 9.1 mg/dL (ref 8.9–10.3)
Chloride: 105 mmol/L (ref 98–111)
Creatinine, Ser: 1.23 mg/dL — ABNORMAL HIGH (ref 0.44–1.00)
GFR, Estimated: 43 mL/min — ABNORMAL LOW (ref >60)
Glucose, Bld: 167 mg/dL — ABNORMAL HIGH (ref 70–99)
Potassium: 4.8 mmol/L (ref 3.5–5.1)
Sodium: 138 mmol/L (ref 135–145)

## 2021-12-24 LAB — GLUCOSE, CAPILLARY: Glucose-Capillary: 149 mg/dL — ABNORMAL HIGH (ref 70–99)

## 2021-12-24 MED ORDER — ASPIRIN 81 MG PO CHEW: 81.0000 mg | CHEWABLE_TABLET | Freq: Once | ORAL | 0 refills | Status: AC

## 2021-12-24 MED ORDER — NITROGLYCERIN 0.4 MG SL SUBL: 0.4000 mg | SUBLINGUAL_TABLET | SUBLINGUAL | 12 refills | Status: AC | PRN

## 2021-12-24 MED ORDER — LOSARTAN POTASSIUM 25 MG PO TABS: 12.5000 mg | ORAL_TABLET | Freq: Every day | ORAL | 0 refills | Status: DC

## 2021-12-24 MED ORDER — LOSARTAN POTASSIUM 25 MG PO TABS
12.5000 mg | ORAL_TABLET | Freq: Every day | ORAL | Status: DC
  Administered 2021-12-24: 12.5 mg via ORAL
  Filled 2021-12-24: qty 1

## 2021-12-24 MED ORDER — CEPHALEXIN 500 MG PO CAPS: 500.0000 mg | ORAL_CAPSULE | Freq: Two times a day (BID) | ORAL | 0 refills | Status: AC

## 2021-12-24 NOTE — Progress Notes (Signed)
Rounding Note    Patient Name: Sarah Potts Date of Encounter: 12/24/2021  [facility] Cardiologist: Ida Rogue, MD   Subjective   Patient feeling well without chest pain or shortness of breath.  No edema.  Planning for discharge home today.  Inpatient Medications    Scheduled Meds:  amiodarone  200 mg Oral Daily   ferrous sulfate  325 mg Oral Q breakfast   And   ascorbic acid  250 mg Oral Q breakfast   aspirin  81 mg Oral Once   atorvastatin  20 mg Oral q AM   cephALEXin  500 mg Oral Q12H   cholecalciferol  3,000 Units Oral Q1400   ezetimibe  10 mg Oral q AM   insulin aspart  0-5 Units Subcutaneous QHS   insulin aspart  0-9 Units Subcutaneous TID WC   losartan  12.5 mg Oral Daily   magnesium oxide  400 mg Oral Daily   metoprolol succinate  50 mg Oral Daily   mirabegron ER  50 mg Oral Daily   montelukast  10 mg Oral QHS   omega-3 acid ethyl esters  2 g Oral BID   pantoprazole  40 mg Oral Daily   pregabalin  75 mg Oral BID   rivaroxaban  15 mg Oral Q supper   sodium chloride flush  3 mL Intravenous Q12H   Continuous Infusions:  sodium chloride     PRN Meds: sodium chloride, acetaminophen, albuterol, dextromethorphan-guaiFENesin, nitroGLYCERIN, ondansetron (ZOFRAN) IV, mouth rinse, sodium chloride flush   Vital Signs    Vitals:   12/23/21 1223 12/23/21 1543 12/23/21 1949 12/24/21 0818  BP: (!) 122/58 (!) 131/53 (!) 131/55 (!) 140/63  Pulse: 61 65 68 61  Resp: '19 18 19 17  '$ Temp: 97.6 F (36.4 C) 98.4 F (36.9 C) 98.3 F (36.8 C) 97.7 F (36.5 C)  TempSrc:   Oral Oral  SpO2: 98% 98% 96% 98%  Weight:      Height:        Intake/Output Summary (Last 24 hours) at 12/24/2021 0945 Last data filed at 12/24/2021 6606 Gross per 24 hour  Intake 3 ml  Output 1300 ml  Net -1297 ml      12/23/2021    5:00 AM 12/22/2021    5:26 AM 12/20/2021    9:53 PM  Last 3 Weights  Weight (lbs) 172 lb 14.4 oz 175 lb 3.2 oz 177 lb 6.4 oz  Weight (kg) 78.427 kg  79.47 kg 80.468 kg      Telemetry    Normal sinus rhythm - Personally Reviewed  ECG    No new tracing.  Physical Exam   GEN: No acute distress.   Neck: JVP approximately 6-8 cm. Cardiac: RRR, no murmurs, rubs, or gallops.  Right radial catheterization site with mild bruising.  No hematoma.  Right radial pulse 2+. Respiratory: Clear to auscultation bilaterally. GI: Soft, nontender, non-distended  MS: No edema; No deformity. Neuro:  Nonfocal  Psych: Normal affect   Labs    High Sensitivity Troponin:   Recent Labs  Lab 12/20/21 1302 12/20/21 1521 12/20/21 2204 12/21/21 0106 12/21/21 0506  TROPONINIHS 359* 1,264* 996* 657* 415*     Chemistry Recent Labs  Lab 12/20/21 1302 12/21/21 0506 12/24/21 0749  NA 138 139 138  K 4.9 4.7 4.8  CL 105 106 105  CO2 '24 24 25  '$ GLUCOSE 162* 112* 167*  BUN '20 19 20  '$ CREATININE 1.29* 1.22* 1.23*  CALCIUM 9.4 8.9 9.1  GFRNONAA 40* 43* 43*  ANIONGAP '9 9 8    '$ Lipids  Recent Labs  Lab 12/21/21 0506  CHOL 123  TRIG 177*  HDL 42  LDLCALC 46  CHOLHDL 2.9    Hematology Recent Labs  Lab 12/20/21 1302 12/21/21 0506 12/23/21 0615  WBC 5.0 4.4 3.3*  RBC 3.86* 3.26* 3.26*  HGB 12.1 10.2* 10.4*  HCT 37.1 31.2* 31.0*  MCV 96.1 95.7 95.1  MCH 31.3 31.3 31.9  MCHC 32.6 32.7 33.5  RDW 15.0 15.1 14.6  PLT 220 189 185   Thyroid No results for input(s): "TSH", "FREET4" in the last 168 hours.  BNP Recent Labs  Lab 12/20/21 1304  BNP 162.4*    DDimer No results for input(s): "DDIMER" in the last 168 hours.   Radiology    CARDIAC CATHETERIZATION  Result Date: 12/23/2021   Ost RCA lesion is 30% stenosed.   Prox RCA lesion is 40% stenosed.   Dist RCA lesion is 20% stenosed.   Mid Cx to Dist Cx lesion is 10% stenosed.   Prox Cx lesion is 40% stenosed.   Prox LAD to Mid LAD lesion is 50% stenosed.   Ost LAD to Prox LAD lesion is 40% stenosed. 1.  Mild to moderate nonobstructive coronary artery disease.  Patent left circumflex  stent with no significant restenosis.  Moderately to severely calcified coronary arteries. 2.  Left ventricular angiography was not performed due to chronic kidney disease.  EF was mildly reduced by echo.  Mildly elevated left ventricular Delson [place]-diastolic pressure. Recommendations: No clear culprit is identified for elevated troponin.  Possible stress-induced cardiomyopathy.  Continue medical therapy.  Resume Xarelto later today. The patient can likely be discharged home in the afternoon if she remains stable. 33 mL of contrast was used for the procedure.    Cardiac Studies   Echocardiogram (12/21/2021):  1. Left ventricular ejection fraction, by estimation, is 40 to 45%. The  left ventricle has mildly decreased function. The left ventricle  demonstrates regional wall motion abnormalities (hypokinesis of teh  anterior/anteroseptal and apical region). Left  ventricular diastolic parameters are indeterminate.   2. Right ventricular systolic function is normal. The right ventricular  size is normal. Tricuspid regurgitation signal is inadequate for assessing  PA pressure.   3. The mitral valve is normal in structure. No evidence of mitral valve  regurgitation. No evidence of mitral stenosis. Moderate mitral annular  calcification.   4. The aortic valve is normal in structure. Aortic valve regurgitation is  not visualized. No aortic stenosis is present.   5. The inferior vena cava is normal in size with greater than 50%  respiratory variability, suggesting right atrial pressure of 3 mmHg.   Left heart catheterization (12/23/2021): See above.  Patient Profile     88 y.o. female with history of CAD status post PCI to the LCx in 2019 in [place], PAF diagnosed in 2016 on Xarelto, HFpEF, DM2, CKD stage IIIa, HTN, HLD, anemia, and anal cancer status post colostomy, admitted with chest pain and elevated troponin, found to have new cardiomyopathy and nonobstructive coronary artery disease consistent with  stress-induced cardiomyopathy.  Assessment & Plan    Stress-induced cardiomyopathy: Catheterization yesterday showed patent LCx stent and mild-moderate, nonobstructive CAD otherwise.  Findings consistent with stress-induced cardiomyopathy.  Patient appears euvolemic and is without further chest pain. -Discontinue isosorbide mononitrate. -Start losartan 12.5 mg daily with plans for repeat BMP at follow-up in 1-2 weeks. -Continue Toprol all succinate 50 mg daily.  Paroxysmal atrial fibrillation:  Patient maintaining sinus rhythm. -Continue metoprolol succinate 50 mg daily and amiodarone 200 mg daily. -Continue rivaroxaban.  Chronic kidney disease stage III: Renal function stable. -Follow-up BMP in 1-2 weeks with addition of losartan today.  Patient is stable for discharge home today.  We will arrange for follow-up with Dr. Rockey Situ or APP in 1-2 weeks.  BMP should be checked at that time as well.  For questions or updates, please contact [facility] Please consult [email or web address] for contact info under [facility].     Signed, Nelva Bush, MD  12/24/2021, 9:45 AM

## 2021-12-24 NOTE — Progress Notes (Signed)
  Transition of Care [facility]) Screening Note   Patient Details  Name: Sarah Potts Date of Birth: Nov 27, 1932   Transition of Care [facility]) CM/SW Contact:    Alberteen Sam, LCSW Phone Number: 12/24/2021, 9:32 AM    Transition of Care Department [facility]) has reviewed patient and no TOC needs have been identified at this time. We will continue to monitor patient advancement through interdisciplinary progression rounds. If new patient transition needs arise, please place a TOC consult.  [place], [place]

## 2021-12-24 NOTE — Discharge Summary (Addendum)
Physician Discharge Summary   Patient: Sarah Potts MRN: [id number] DOB: Nov 09, 1932  Admit date:     12/20/2021  Discharge date: 12/24/21  Discharge Physician: Fritzi Mandes   PCP: Maryland Pink, MD   Recommendations at discharge:    F/u Dr Rockey Situ in 1-2 weeks F/u PCP in 1 week  Discharge Diagnoses: Elevated Troponin likely due to Stress induced CMP  Hospital Course:  Sarah Potts is a 88 y.o. female with medical history significant of CAD, s/p of stent placement, HTN, HLD, DM, asthma, CKD-3A, A fib on Xarelto, dCHF, anal cancer (s/p of colostomy, radiation and chemotherapy), GERD, who presents with chest pain.   Patient states that her chest pain started at about 11 AM, which is located in the central chest, pressure-like, initially 7 out of 10 in severity, currently 2 out of 10 in severity, radiating to the left shoulder.  Associated with mild shortness of breath and nausea, vomiting.     NSTEMI (non-ST elevated myocardial infarction) (HCC)--II  Elevated troponin likley due to Stress induecd cardiomyopathy  hx of CAD: s/p of stent. Trop 359.  --Consulted Dr. Darlis Loan-- plans for cardiac catheterization 9/11 -Trend Trop--  peaked at 1200--trending down - prn Nitroglycerin, Morphine, lipitor  -continue aspirin and statin -IV heparin drip--now d/ced - A1C was 7.2 on 11/30/21 -- patient is chest pain-free --9/11 Cardiac catheterization was done today and showed patent left circumflex stent with mild to moderate nonobstructive coronary artery disease.  No culprit is identified for elevated troponin.  Suspect that her presentation is due to stress-induced cardiomyopathy. --cp free, vitals ok --cont ASA 81 mg, add losartan 12.5 mg (per Dr End)   HTN (hypertension) -IV hydralazine as needed -Metoprolol   Hyperlipidemia - Lipitor and zetia   Stage 3a chronic kidney disease ([place]) Renal function stable at baseline.  Baseline creatinine 1.32 on 12/01/2021.  Her creatinine is 1.29,  BUN 20, GFR 40 today. -Follow-up with BMP   Atrial fibrillation, chronic (HCC) Heart rate  stable -Continue metoprolol, amiodarone -resume Xarelto per Dr. Fletcher Anon    Type II diabetes mellitus with renal manifestations ([place]) Recent A1c 7.2 on 11/30/2021, poorly controlled.  -- Patient is taking Trulicity and metformin at home--resume at d/c -Sliding scale insulin   Chronic diastolic CHF (congestive heart failure) ([place]) 2D echo 12/01/2021 showed EF of 55-60% with grade 1 diastolic dysfunction.  Patient does not have leg edema or JVD.  CHF seem to be compensated. -Check BNP --> 162.   E. coli UTI (urinary tract infection) -per urine culture August 31 as outpatient -- patient was started on PO Cipro. She took three tablets only. -- Will give Keflex 500 BID for  3 more doses   Body mass index is 31.62 kg/m. Obesity   Procedures: cardiac cath Family communication : son at bedside Consults : [facility] cardiology CODE STATUS: full DVT Prophylaxis : Xarelto  Overall stable. D/c home. Pt agreeable      Consultants: [facility] cards Procedures performed: cath  Disposition: Home Diet recommendation:  Discharge Diet Orders (From admission, onward)     Start     Ordered   12/24/21 0000  Diet - low sodium heart healthy        12/24/21 0915           Cardiac diet DISCHARGE MEDICATION: Allergies as of 12/24/2021       Reactions   Levofloxacin Nausea Only   Patient reports that she thinks that she could take this medication.  Niacin Other (See Comments)   Hot, Flushed   Niacin And Related Other (See Comments)   Pt states she get real red and hot.        Medication List     STOP taking these medications    meclizine 25 MG tablet Commonly known as: ANTIVERT   traZODone 50 MG tablet Commonly known as: DESYREL       TAKE these medications    amiodarone 200 MG tablet Commonly known as: PACERONE 1 tab by mouth twice a day for two weeks, then 1 tab by mouth daily    aspirin 81 MG chewable tablet Chew 1 tablet (81 mg total) by mouth once for 1 dose.   atorvastatin 20 MG tablet Commonly known as: LIPITOR Take 1 tablet (20 mg total) by mouth in the morning. For cholesterol   cephALEXin 500 MG capsule Commonly known as: KEFLEX Take 1 capsule (500 mg total) by mouth every 12 (twelve) hours for 3 doses.   Cholecalciferol 25 MCG (1000 UT) tablet Take by mouth. Pt takes 3 daily   ezetimibe 10 MG tablet Commonly known as: ZETIA Take 1 tablet (10 mg total) by mouth in the morning. For cholesterol   Iron-Vitamin C 65-125 MG Tabs Take 1 tablet by mouth daily.   losartan 25 MG tablet Commonly known as: COZAAR Take 0.5 tablets (12.5 mg total) by mouth daily.   magnesium oxide 400 MG tablet Commonly known as: MAG-OX Take 400 mg by mouth daily.   metFORMIN 1000 MG (MOD) 24 hr tablet Commonly known as: GLUMETZA Take 1 tablet (1,000 mg total) by mouth daily with breakfast.   metoprolol succinate 50 MG 24 hr tablet Commonly known as: TOPROL-XL TAKE 1 TABLET BY MOUTH EVERY DAY   mirabegron ER 50 MG Tb24 tablet Commonly known as: MYRBETRIQ Take by mouth.   montelukast 10 MG tablet Commonly known as: SINGULAIR Take 10 mg by mouth at bedtime.   nitroGLYCERIN 0.2 mg/hr patch Commonly known as: NITRODUR - Dosed in mg/24 hr What changed: Another medication with the same name was added. Make sure you understand how and when to take each.   nitroGLYCERIN 0.4 MG SL tablet Commonly known as: NITROSTAT Place 1 tablet (0.4 mg total) under the tongue every 5 (five) minutes as needed for chest pain. What changed: You were already taking a medication with the same name, and this prescription was added. Make sure you understand how and when to take each.   omega-3 acid ethyl esters 1 g capsule Commonly known as: LOVAZA Take 2 g by mouth 2 (two) times daily.   omeprazole 40 MG capsule Commonly known as: PRILOSEC Take 40 mg by mouth daily.   pregabalin  75 MG capsule Commonly known as: LYRICA Take 75 mg by mouth 2 (two) times daily.   Rivaroxaban 15 MG Tabs tablet Commonly known as: XARELTO Take 1 tablet (15 mg total) by mouth daily.   Trulicity 1.5 DJ/2.4QA Sopn Generic drug: Dulaglutide Inject into the skin once a week.        Follow-up Information     Maryland Pink, MD. Schedule an appointment as soon as possible for a visit in 1 week(s).   Specialty: Family Medicine Why: Dr. Barbarann Ehlers nurse will call you directly to schedule this appointment. Contact information: [address] [place] [address] [phone]                Discharge Exam: Filed Weights   12/20/21 2153 12/22/21 0526 12/23/21 0500  Weight: 80.5 kg 79.5 kg 78.4 kg     Condition at discharge:   The results of significant diagnostics from this hospitalization (including imaging, microbiology, ancillary and laboratory) are listed below for reference.   Imaging Studies: CARDIAC CATHETERIZATION  Result Date: 12/23/2021   Ost RCA lesion is 30% stenosed.   Prox RCA lesion is 40% stenosed.   Dist RCA lesion is 20% stenosed.   Mid Cx to Dist Cx lesion is 10% stenosed.   Prox Cx lesion is 40% stenosed.   Prox LAD to Mid LAD lesion is 50% stenosed.   Ost LAD to Prox LAD lesion is 40% stenosed. 1.  Mild to moderate nonobstructive coronary artery disease.  Patent left circumflex stent with no significant restenosis.  Moderately to severely calcified coronary arteries. 2.  Left ventricular angiography was not performed due to chronic kidney disease.  EF was mildly reduced by echo.  Mildly elevated left ventricular end-diastolic pressure. Recommendations: No clear culprit is identified for elevated troponin.  Possible stress-induced cardiomyopathy.  Continue medical therapy.  Resume Xarelto later today. The patient can likely be discharged home in the afternoon if she remains stable. 33 mL of contrast was used for the procedure.   ECHOCARDIOGRAM  COMPLETE  Result Date: 12/21/2021    ECHOCARDIOGRAM REPORT   Patient Name:   Sarah Potts Date of Exam: 12/21/2021 Medical Rec #:  [id number]      Height:       62.0 in Accession #:    [id number]     Weight:       177.4 lb Date of Birth:  03/21/1933      BSA:          1.817 m Patient Age:    37 years       BP:           121/59 mmHg Patient Gender: F              HR:           63 bpm. Exam Location:  [facility] Procedure: 2D Echo and Intracardiac Opacification Agent Indications:     NSTEMI I21.4  History:         Patient has prior history of Echocardiogram examinations, most                  recent 12/01/2021.  Sonographer:     Kathlen Brunswick RDCS Referring Phys:  [id number] [place] Diagnosing Phys: Ida Rogue MD  Sonographer Comments: Technically difficult study due to poor echo windows and suboptimal apical window. Image acquisition challenging due to patient body habitus. IMPRESSIONS  1. Left ventricular ejection fraction, by estimation, is 40 to 45%. The left ventricle has mildly decreased function. The left ventricle demonstrates regional wall motion abnormalities (hypokinesis of teh anterior/anteroseptal and apical region). Left ventricular diastolic parameters are indeterminate.  2. Right ventricular systolic function is normal. The right ventricular size is normal. Tricuspid regurgitation signal is inadequate for assessing PA pressure.  3. The mitral valve is normal in structure. No evidence of mitral valve regurgitation. No evidence of mitral stenosis. Moderate mitral annular calcification.  4. The aortic valve is normal in structure. Aortic valve regurgitation is not visualized. No aortic stenosis is present.  5. The inferior vena cava is normal in size with greater than 50% respiratory variability, suggesting right atrial pressure of 3 mmHg. FINDINGS  Left Ventricle: Left ventricular ejection fraction, by estimation, is 40 to 45%. The left ventricle has mildly decreased function.  The left ventricle  demonstrates regional wall motion abnormalities. Definity contrast agent was given IV to delineate the left ventricular endocardial borders. The left ventricular internal cavity size was normal in size. There is no left ventricular hypertrophy. Left ventricular diastolic parameters are indeterminate. Right Ventricle: The right ventricular size is normal. No increase in right ventricular wall thickness. Right ventricular systolic function is normal. Tricuspid regurgitation signal is inadequate for assessing PA pressure. Left Atrium: Left atrial size was normal in size. Right Atrium: Right atrial size was normal in size. Pericardium: There is no evidence of pericardial effusion. Mitral Valve: The mitral valve is normal in structure. Moderate mitral annular calcification. No evidence of mitral valve regurgitation. No evidence of mitral valve stenosis. Tricuspid Valve: The tricuspid valve is normal in structure. Tricuspid valve regurgitation is not demonstrated. No evidence of tricuspid stenosis. Aortic Valve: The aortic valve is normal in structure. Aortic valve regurgitation is not visualized. No aortic stenosis is present. Aortic valve peak gradient measures 4.8 mmHg. Pulmonic Valve: The pulmonic valve was normal in structure. Pulmonic valve regurgitation is not visualized. No evidence of pulmonic stenosis. Aorta: The aortic root is normal in size and structure. Venous: The inferior vena cava is normal in size with greater than 50% respiratory variability, suggesting right atrial pressure of 3 mmHg. IAS/Shunts: No atrial level shunt detected by color flow Doppler.  LEFT VENTRICLE PLAX 2D LVIDd:         3.99 cm   Diastology LVIDs:         2.85 cm   LV e' medial:    4.03 cm/s LV PW:         1.10 cm   LV E/e' medial:  18.5 LV IVS:        1.06 cm   LV e' lateral:   4.13 cm/s LVOT diam:     2.00 cm   LV E/e' lateral: 18.1 LV SV:         79 LV SV Index:   44 LVOT Area:     3.14 cm  LEFT ATRIUM         Index LA diam:     3.30 cm 1.82 cm/m  AORTIC VALVE                 PULMONIC VALVE AV Area (Vmax): 3.00 cm     PV Vmax:       0.95 m/s AV Vmax:        109.00 cm/s  PV Peak grad:  3.6 mmHg AV Peak Grad:   4.8 mmHg LVOT Vmax:      104.00 cm/s LVOT Vmean:     69.500 cm/s LVOT VTI:       0.253 m  AORTA Ao Root diam: 2.90 cm Ao Asc diam:  2.90 cm MITRAL VALVE MV Area (PHT): 2.55 cm    SHUNTS MV Decel Time: 297 msec    Systemic VTI:  0.25 m MV E velocity: 74.60 cm/s  Systemic Diam: 2.00 cm MV A velocity: 96.80 cm/s MV E/A ratio:  0.77 Ida Rogue MD Electronically signed by Ida Rogue MD Signature Date/Time: 12/21/2021/12:50:42 PM    Final    DG Chest 2 View  Result Date: 12/20/2021 CLINICAL DATA:  Chest pain EXAM: CHEST - 2 VIEW COMPARISON:  Chest x-ray dated November 30, 2021 FINDINGS: The heart size and mediastinal contours are within normal limits. Both lungs are clear. The visualized skeletal structures are unremarkable. IMPRESSION: No active cardiopulmonary disease. Electronically Signed   By:  Yetta Glassman M.D.   On: 12/20/2021 13:45   ECHOCARDIOGRAM COMPLETE  Result Date: 12/01/2021    ECHOCARDIOGRAM REPORT   Patient Name:   Sarah Potts Date of Exam: 12/01/2021 Medical Rec #:  [id number]      Height:       62.0 in Accession #:    [id number]     Weight:       172.0 lb Date of Birth:  28-Jul-1932      BSA:          1.793 m Patient Age:    54 years       BP:           113/52 mmHg Patient Gender: F              HR:           78 bpm. Exam Location:  [facility] Procedure: 2D Echo and Intracardiac Opacification Agent Indications:     Atrial Fibrillation I48.91  History:         Patient has no prior history of Echocardiogram examinations.  Sonographer:     Kathlen Brunswick RDCS Referring Phys:  [place] WGNF Diagnosing Phys: Ida Rogue MD  Sonographer Comments: Technically difficult study due to poor echo windows and suboptimal apical window. Image acquisition challenging due to patient body habitus. IMPRESSIONS  1.  Left ventricular ejection fraction, by estimation, is 55 to 60%. The left ventricle has normal function. The left ventricle has no regional wall motion abnormalities. Left ventricular diastolic parameters are consistent with Grade I diastolic dysfunction (impaired relaxation).  2. Right ventricular systolic function is normal. The right ventricular size is normal.  3. The mitral valve is normal in structure. No evidence of mitral valve regurgitation. No evidence of mitral stenosis. Moderate mitral annular calcification.  4. The aortic valve is normal in structure. Aortic valve regurgitation is not visualized. Aortic valve sclerosis is present, with no evidence of aortic valve stenosis.  5. The inferior vena cava is normal in size with greater than 50% respiratory variability, suggesting right atrial pressure of 3 mmHg. FINDINGS  Left Ventricle: Left ventricular ejection fraction, by estimation, is 55 to 60%. The left ventricle has normal function. The left ventricle has no regional wall motion abnormalities. Definity contrast agent was given IV to delineate the left ventricular  endocardial borders. The left ventricular internal cavity size was normal in size. There is no left ventricular hypertrophy. Left ventricular diastolic parameters are consistent with Grade I diastolic dysfunction (impaired relaxation). Right Ventricle: The right ventricular size is normal. No increase in right ventricular wall thickness. Right ventricular systolic function is normal. Left Atrium: Left atrial size was normal in size. Right Atrium: Right atrial size was normal in size. Pericardium: There is no evidence of pericardial effusion. Mitral Valve: The mitral valve is normal in structure. Moderate mitral annular calcification. No evidence of mitral valve regurgitation. No evidence of mitral valve stenosis. MV peak gradient, 5.0 mmHg. The mean mitral valve gradient is 2.0 mmHg. Tricuspid Valve: The tricuspid valve is normal in  structure. Tricuspid valve regurgitation is mild . No evidence of tricuspid stenosis. Aortic Valve: The aortic valve is normal in structure. Aortic valve regurgitation is not visualized. Aortic valve sclerosis is present, with no evidence of aortic valve stenosis. Aortic valve peak gradient measures 9.7 mmHg. Pulmonic Valve: The pulmonic valve was normal in structure. Pulmonic valve regurgitation is not visualized. No evidence of pulmonic stenosis. Aorta: The aortic root is normal in  size and structure. Venous: The inferior vena cava is normal in size with greater than 50% respiratory variability, suggesting right atrial pressure of 3 mmHg. IAS/Shunts: No atrial level shunt detected by color flow Doppler.  LEFT VENTRICLE PLAX 2D LVIDd:         4.39 cm     Diastology LVIDs:         3.05 cm     LV e' medial:    6.20 cm/s LV PW:         1.31 cm     LV E/e' medial:  17.6 LV IVS:        1.01 cm     LV e' lateral:   5.55 cm/s LVOT diam:     1.90 cm     LV E/e' lateral: 19.6 LV SV:         50 LV SV Index:   28 LVOT Area:     2.84 cm  LV Volumes (MOD) LV vol d, MOD A2C: 67.7 ml LV vol d, MOD A4C: 53.4 ml LV vol s, MOD A2C: 21.1 ml LV vol s, MOD A4C: 19.1 ml LV SV MOD A2C:     46.6 ml LV SV MOD A4C:     53.4 ml LV SV MOD BP:      40.3 ml RIGHT VENTRICLE RV Basal diam:  3.38 cm RV S prime:     14.10 cm/s TAPSE (M-mode): 2.1 cm LEFT ATRIUM             Index        RIGHT ATRIUM           Index LA diam:        4.50 cm 2.51 cm/m   RA Area:     14.40 cm LA Vol (A2C):   45.8 ml 25.54 ml/m  RA Volume:   41.30 ml  23.03 ml/m LA Vol (A4C):   64.4 ml 35.92 ml/m LA Biplane Vol: 58.9 ml 32.85 ml/m  AORTIC VALVE                 PULMONIC VALVE AV Area (Vmax): 1.41 cm     PV Vmax:       0.87 m/s AV Vmax:        156.00 cm/s  PV Peak grad:  3.0 mmHg AV Peak Grad:   9.7 mmHg LVOT Vmax:      77.70 cm/s LVOT Vmean:     56.700 cm/s LVOT VTI:       0.177 m  AORTA Ao Root diam: 2.80 cm Ao Asc diam:  2.60 cm MITRAL VALVE                 TRICUSPID VALVE MV Area (PHT): 3.87 cm     TV Peak grad:   25.7 mmHg MV Area VTI:   1.52 cm     TV Vmax:        2.54 m/s MV Peak grad:  5.0 mmHg MV Mean grad:  2.0 mmHg     SHUNTS MV Vmax:       1.12 m/s     Systemic VTI:  0.18 m MV Vmean:      70.0 cm/s    Systemic Diam: 1.90 cm MV Decel Time: 196 msec MV E velocity: 109.00 cm/s MV A velocity: 93.40 cm/s MV E/A ratio:  1.17 Ida Rogue MD Electronically signed by Ida Rogue MD Signature Date/Time: 12/01/2021/3:25:10 PM    Final    CT HEAD WO CONTRAST (5MM)  Result Date: 11/30/2021 CLINICAL DATA:  Head trauma. EXAM: CT HEAD WITHOUT CONTRAST TECHNIQUE: Contiguous axial images were obtained from the base of the skull through the vertex without intravenous contrast. RADIATION DOSE REDUCTION: This exam was performed according to the departmental dose-optimization program which includes automated exposure control, adjustment of the mA and/or kV according to patient size and/or use of iterative reconstruction technique. COMPARISON:  Head CT dated 06/03/2021. FINDINGS: Brain: Mild age-related atrophy and chronic microvascular ischemic changes. There is no acute intracranial hemorrhage. No mass effect or midline shift. No extra-axial fluid collection. Vascular: No hyperdense vessel or unexpected calcification. Skull: Normal. Negative for fracture or focal lesion. Sinuses/Orbits: No acute finding. Other: None IMPRESSION: 1. No acute intracranial pathology. 2. Mild age-related atrophy and chronic microvascular ischemic changes. Electronically Signed   By: Anner Crete M.D.   On: 11/30/2021 20:19   DG Chest 2 View  Result Date: 11/30/2021 CLINICAL DATA:  Chest pain EXAM: CHEST - 2 VIEW COMPARISON:  06/30/2013 FINDINGS: The heart size and mediastinal contours are within normal limits. Both lungs are clear. The visualized skeletal structures are unremarkable. IMPRESSION: No active cardiopulmonary disease. Electronically Signed   By: Rolm Baptise M.D.   On:  11/30/2021 18:53    Microbiology: Results for orders placed or performed during the hospital encounter of 12/20/21  Urine Culture     Status: None   Collection Time: 12/20/21  5:37 PM   Specimen: Urine, Suprapubic  Result Value Ref Range Status   Specimen Description   Final    URINE, SUPRAPUBIC Performed at [facility], [address] [place], [place] [address]    Special Requests   Final    NONE Performed at [facility], [address], [place], [place] [address]    Culture   Final    NO GROWTH Performed at [facility], [place] [address], [place], [place] [address]    Report Status 12/22/2021 FINAL  Final    Labs: CBC: Recent Labs  Lab 12/20/21 1302 12/21/21 0506 12/23/21 0615  WBC 5.0 4.4 3.3*  HGB 12.1 10.2* 10.4*  HCT 37.1 31.2* 31.0*  MCV 96.1 95.7 95.1  PLT 220 189 638   Basic Metabolic Panel: Recent Labs  Lab 12/20/21 1302 12/21/21 0506 12/24/21 0749  NA 138 139 138  K 4.9 4.7 4.8  CL 105 106 105  CO2 '24 24 25  '$ GLUCOSE 162* 112* 167*  BUN '20 19 20  '$ CREATININE 1.29* 1.22* 1.23*  CALCIUM 9.4 8.9 9.1   Liver Function Tests: No results for input(s): "AST", "ALT", "ALKPHOS", "BILITOT", "PROT", "ALBUMIN" in the last 168 hours. CBG: Recent Labs  Lab 12/22/21 1652 12/22/21 2044 12/23/21 0722 12/23/21 1953 12/24/21 0816  GLUCAP 143* 146* 131* 158* 149*    Discharge time spent: greater than 30 minutes.  Signed: Fritzi Mandes, MD [facility] 12/24/2021

## 2021-12-30 NOTE — Progress Notes (Signed)
Cardiology Clinic Note   Patient Name: Sarah Potts Date of Encounter: 01/03/2022  Primary Care Provider:  [place] Pink, MD Primary Cardiologist:  Ida Rogue, MD  Patient Profile    88 year old female significant past medical history of paroxysmal atrial fibrillation, diabetes, hyperlipidemia, essential hypertension, coronary artery disease status post PCI, history of colonoscopy in the past who is here today for hospital follow-up after being admitted for chest pain undergoing cardiac catheterization.  Past Medical History    Past Medical History:  Diagnosis Date   Anal cancer ([place])    Diabetes mellitus    Hyperlipidemia    Hypertension    Past Surgical History:  Procedure Laterality Date   BLADDER SURGERY     CARDIAC CATHETERIZATION     COLOSTOMY     CORONARY ANGIOPLASTY WITH STENT PLACEMENT  03/25/2018   Stent to the LCX  2.50 mm x 20m  [employer] [facility] [id number]  heart stent     LEFT HEART CATH AND CORONARY ANGIOGRAPHY N/A 12/23/2021   Procedure: LEFT HEART CATH AND CORONARY ANGIOGRAPHY;  Surgeon: AWellington Hampshire MD;  [facility];  Service: Cardiovascular;  Laterality: N/A;   TONSILLECTOMY     uterus surgery     removed    Allergies  Allergies  Allergen Reactions   Levofloxacin Nausea Only    Patient reports that she thinks that she could take this medication.    Niacin Other (See Comments)    Hot, Flushed   Niacin And Related Other (See Comments)    Pt states she get real red and hot.    History of Present Illness    88year old female with above-mentioned past medical history significant for paroxysmal atrial fibrillation that recently required hospitalization for RVR, type 2 diabetes, hyperlipidemia, essential pretension, coronary artery disease status post PCI with DES, history of colonoscopy in the past who [facility]  to the [facility] for chest pain.  [facility].  She had initially been placed on Cardizem drip but became hypotensive and was switched to IV amiodarone.  And she spontaneously converted to sinus rhythm after being on amiodarone but continued to have mild hypotension.  Her home losartan amlodipine and Nitropatch were all placed on hold and she was started on midodrine to help elevate her blood pressures along with IV fluids.  Echocardiogram revealed LVEF of 50-60%, no regional wall motion abnormalities, grade 1 diastolic dysfunction with impaired relaxation.  No evidence of MR with moderate mitral annular calcification.    [facility] with complaints of chest pain.  She rated the chest pain as a pressure and initially was 7 out of 10 in severity with associated symptoms of shortness of breath and nausea.  She stated all of her symptoms started after taking a new antibiotic.  Pain did feel similar to her angina leading up to her PCI in 2019.  Pain persisted until received an aspirin and sublingual nitro in the emergency department.  Blood pressure stable in the 1627Oto 1350Ksystolic.  Heart rate is in the 70s.  Afebrile.  Oxygen saturation 97-9% on room air.  EKG showed sinus rhythm with a rate of 74 with first-degree AV block and nonspecific IVCD, and nonspecific lateral ST and T wave changes.  Initial  high-sensitivity troponin was 359, WBCs of 5, hemoglobin 12.1, platelets of 220, potassium 4.9, BUN 20, serum creatinine of 1.29.  She was subsequently placed on a heparin drip with improved but not resolved chest discomfort.  She underwent repeat echocardiogram which showed LVEF dropped to 40-45%, with regional wall motion abnormality, moderate mitral annular calcification.  On 12/23/2021 she underwent left heart catheterization which revealed mild to moderate  nonobstructive coronary artery disease.  Patent left circumflex stent with no significant restenosis.  Moderately to severely calcified coronary arteries.  Left ventricular angiography was not performed due to chronic kidney disease.  EF was mildly reduced by echo.  Mildly elevated left ventricular end diastolic pressure.  There was no clear culprit identified for the elevated troponin.  Possible stress-induced cardiomyopathy.  Continued with medical therapy and resumed Xarelto.  She returns to clinic today with several questions that she needed answered.  She denies any current chest pain, shortness of breath, dyspnea on exertion, or peripheral edema.  Right radial cath site has a fading bruise noted but the side has no hematoma and 2+ radial pulse without any discomfort from her today.  She is concerned about potentially having a scheduled upcoming colonoscopy with the stress that she has just had from a stress-induced cardiomyopathy that required her heart catheterization but she is worried that the prep and the procedure itself will be more strenuous than she thinks that she can undertake at this time.  She is also curious as to when she can get the next injection in her back for her chronic back pain.   Home Medications    Current Outpatient Medications  Medication Sig Dispense Refill   amiodarone (PACERONE) 200 MG tablet 1 tab by mouth twice a day for two weeks, then 1 tab by mouth daily 60 tablet 1   atorvastatin (LIPITOR) 20 MG tablet Take 1 tablet (20 mg total) by mouth in the morning. For cholesterol 90 tablet 3   Cholecalciferol 25 MCG (1000 UT) tablet Take by mouth. Pt takes 3 daily     ezetimibe (ZETIA) 10 MG tablet Take 1 tablet (10 mg total) by mouth in the morning. For cholesterol 90 tablet 3   Iron-Vitamin C 65-125 MG TABS Take 1 tablet by mouth daily. 30 tablet 3   losartan (COZAAR) 25 MG tablet Take 0.5 tablets (12.5 mg total) by mouth daily. 30 tablet 0   magnesium oxide (MAG-OX)  400 MG tablet Take 400 mg by mouth daily.     metFORMIN (GLUMETZA) 1000 MG (MOD) 24 hr tablet Take 1 tablet (1,000 mg total) by mouth daily with breakfast.     metoprolol succinate (TOPROL-XL) 50 MG 24 hr tablet TAKE 1 TABLET BY MOUTH EVERY DAY 90 tablet 3   mirabegron ER (MYRBETRIQ) 50 MG TB24 tablet Take by mouth.     montelukast (SINGULAIR) 10 MG tablet Take 10 mg by mouth at bedtime.       nitroGLYCERIN (NITROSTAT) 0.4 MG SL tablet Place 1 tablet (0.4 mg total) under the tongue every 5 (five) minutes as needed for chest pain. 20 tablet 12   omega-3 acid ethyl esters (LOVAZA) 1 g capsule Take 2 g by mouth 2 (two) times daily.       omeprazole (PRILOSEC) 40 MG capsule Take 40 mg by mouth daily.       pregabalin (LYRICA) 150 MG capsule Take 150 mg by mouth 2 (two) times daily.     Rivaroxaban (XARELTO) 15 MG TABS tablet Take 1 tablet (15  mg total) by mouth daily. 90 tablet 3   TRULICITY 1.5 SA/6.3KZ SOPN Inject into the skin once a week.     No current facility-administered medications for this visit.     Family History    Family History  Problem Relation Age of Onset   Cancer Mother    Breast cancer Mother    Heart disease Father    Heart attack Father    Cancer Sister    Heart disease Sister    Cervical cancer Sister    Heart disease Brother    She indicated that her mother is deceased. She indicated that her father is deceased. She indicated that her sister is deceased. She indicated that her brother is deceased. She indicated that her son is alive.  Social History    Social History   Socioeconomic History   Marital status: Single    Spouse name: Not on file   Number of children: Not on file   Years of education: Not on file   Highest education level: Not on file  Occupational History   Not on file  Tobacco Use   Smoking status: Never   Smokeless tobacco: Never  Vaping Use   Vaping Use: Never used  Substance and Sexual Activity   Alcohol use: No   Drug use: No    Sexual activity: Not on file  Other Topics Concern   Not on file  Social History Narrative   Not on file   Social Determinants of Health   Financial Resource Strain: Not on file  Food Insecurity: Not on file  Transportation Needs: Not on file  Physical Activity: Not on file  Stress: Not on file  Social Connections: Not on file  Intimate Partner Violence: Not on file     Review of Systems    General:  No chills, fever, night sweats or weight changes.  Endorses fatigue Cardiovascular:  No chest pain, endorses unchanged dyspnea on exertion, edema, denies orthopnea, palpitations, paroxysmal nocturnal dyspnea. Dermatological: No rash, lesions/masses Respiratory: No cough, endorses unchanged dyspnea Urologic: No hematuria, dysuria Abdominal:   No nausea, vomiting, diarrhea, bright red blood per rectum, melena, or hematemesis Musculoskeletal: Endorses chronic back pain Neurologic:  No visual changes, wkns, changes in mental status. All other systems reviewed and are otherwise negative except as noted above.    Cardiac Rehabilitation Eligibility Assessment  The patient is ready to start cardiac rehabilitation from a cardiac standpoint.    Physical Exam    VS:  BP 110/80 (BP Location: Left Arm, Patient Position: Sitting, Cuff Size: Normal)   Pulse 67   Ht '5\' 2"'$  (1.575 m)   Wt 176 lb 2 oz (79.9 kg)   SpO2 92%   BMI 32.21 kg/m  , BMI Body mass index is 32.21 kg/m.     GEN: Well nourished, well developed, in no acute distress. HEENT: normal. Neck: Supple, no JVD, carotid bruits, or masses. Cardiac: RRR, no murmurs, rubs, or gallops. No clubbing, cyanosis, edema.  Radials/DP/PT 2+ and equal bilaterally.  Fading bruise to the right radial cath site. Respiratory:  Respirations regular and unlabored, clear to auscultation bilaterally. GI: Soft, nontender, nondistended, BS + x 4. MS: no deformity or atrophy. Skin: warm and dry, no rash. Neuro:  Strength and sensation are  intact. Psych: Normal affect.  Accessory Clinical Findings    ECG personally reviewed by me today-sinus rhythm with a rate of 67 with first-degree AV block, chronic left bundle branch block, and a left axis deviation  with LVH- No acute changes  Lab Results  Component Value Date   WBC 3.3 (L) 12/23/2021   HGB 10.4 (L) 12/23/2021   HCT 31.0 (L) 12/23/2021   MCV 95.1 12/23/2021   PLT 185 12/23/2021   Lab Results  Component Value Date   CREATININE 1.23 (H) 12/24/2021   BUN 20 12/24/2021   NA 138 12/24/2021   K 4.8 12/24/2021   CL 105 12/24/2021   CO2 25 12/24/2021   Lab Results  Component Value Date   ALT 17 10/09/2021   AST 23 10/09/2021   ALKPHOS 44 10/09/2021   BILITOT 0.8 10/09/2021   Lab Results  Component Value Date   CHOL 123 12/21/2021   HDL 42 12/21/2021   LDLCALC 46 12/21/2021   TRIG 177 (H) 12/21/2021   CHOLHDL 2.9 12/21/2021    Lab Results  Component Value Date   HGBA1C 7.2 (H) 11/30/2021   Assessment and plan  1.  Stress-induced cardiomyopathy with recent left heart catheterization showed  patent left circumflex stent and mild to moderate nonobstructive coronary artery disease otherwise findings were consistent with a stress-induced cardiomyopathy from her heart catheterization.  Echocardiogram during hospitalization revealed LVEF of 40-45% mildly decreased function, it did demonstrate regional wall motion abnormalities of hypokinesis in the anterior/anterior septal and apical region prior to her heart catheterization, she has noted moderate mitral annular calcification and no other valvular abnormalities were noted on exam.  Patient appears to be euvolemic and has remained without further chest discomfort since her discharge from the hospital.  She is continued on losartan 12.5 mg daily and metoprolol XL 50 mg daily.  With her recent hospitalization and stress-induced cardiomyopathy it is reasonable to hold off on preforming  colonoscopy.  Upon discussing  colonoscopy with patient she is also not had FOBT to determine if there was any occult blood in her stool.  It is reasonable to do FOBT testing and later on if needed resume with a course of colonoscopy.  Hemoglobin from blood work done yesterday by her primary care provider was 11.7 which is improved from her hospitalization.  2.  Paroxysmal atrial fibrillation with her being in sinus rhythm today.  Her atrial fibrillation dates back to 2016.  She has been continued on Xarelto 15 mg daily for CHA2DS2-VASc score of at least 7.  She has been continued on amiodarone and metoprolol XL.  3.  Essential hypertension with blood pressure today being 110/80.  This is improved since starting on losartan 12.5 mg daily during her recent hospitalization.  (On recent hospitalization losartan, amlodipine, and Nitrostat were all placed on hold).  She is continue to monitor her blood pressure at home as well.  4.  Coronary artery disease with stable angina who remains chest pain-free today after recent hospitalization.  She remains on Xarelto in place of aspirin.  She is continued on atorvastatin.  She was started on Imdur during the hospital which was subsequently discontinued.  She was discharged with Nitrostat 0.4 mg sublingual to take as needed.  EKG today that was done revealed sinus with a chronic left bundle and first-degree AV block which is unchanged from prior studies.  5.  Hyperlipidemia with LDL of 49 on 12/01/2021.  She is continued on atorvastatin, Zetia, and Lovaza.  6.  Chronic kidney disease stage IIIa with stable creatinine 1.23 on 12/24/2021 at discharge.  She also had blood work drawn that yesterday by her PCP.  Her serum baseline is around 1.20.  7.  Diabetes type  2 with last hemoglobin A1c of 7.2 on 11/30/2021.  She is continued on Trulicity and metformin.  This continues to be monitored by her PCP.  8.  Disposition patient to return to clinic to see MD/APP in 3 months or sooner if needed.  Maan Zarcone, NP 01/03/2022, 1:10 PM

## 2022-01-01 DIAGNOSIS — I25119 Atherosclerotic heart disease of native coronary artery with unspecified angina pectoris: Secondary | ICD-10-CM | POA: Diagnosis not present

## 2022-01-02 DIAGNOSIS — I25119 Atherosclerotic heart disease of native coronary artery with unspecified angina pectoris: Secondary | ICD-10-CM | POA: Diagnosis not present

## 2022-01-03 ENCOUNTER — Encounter: Payer: Self-pay | Admitting: Cardiology

## 2022-01-03 ENCOUNTER — Ambulatory Visit: Payer: PPO | Attending: Cardiology | Admitting: Cardiology

## 2022-01-03 VITALS — BP 110/80 | HR 67 | Ht 62.0 in | Wt 176.1 lb

## 2022-01-03 DIAGNOSIS — E782 Mixed hyperlipidemia: Secondary | ICD-10-CM | POA: Diagnosis not present

## 2022-01-03 DIAGNOSIS — I25118 Atherosclerotic heart disease of native coronary artery with other forms of angina pectoris: Secondary | ICD-10-CM

## 2022-01-03 DIAGNOSIS — E1159 Type 2 diabetes mellitus with other circulatory complications: Secondary | ICD-10-CM

## 2022-01-03 DIAGNOSIS — I4891 Unspecified atrial fibrillation: Secondary | ICD-10-CM

## 2022-01-03 DIAGNOSIS — I5181 Takotsubo syndrome: Secondary | ICD-10-CM | POA: Diagnosis not present

## 2022-01-03 DIAGNOSIS — N1831 Chronic kidney disease, stage 3a: Secondary | ICD-10-CM

## 2022-01-03 NOTE — Patient Instructions (Signed)
Medication Instructions:  Your physician recommends that you continue on your current medications as directed. Please refer to the Current Medication list given to you today.  *If you need a refill on your cardiac medications before your next appointment, please call your pharmacy*   Lab Work: NONE ordered at this time of appointment  If you have labs (blood work) drawn today and your tests are completely normal, you will receive your results only by: [place] (if you have MyChart) OR A paper copy in the mail If you have any lab test that is abnormal or we need to change your treatment, we will call you to review the results.   Testing/Procedures: NONE ordered at this time of appointment   Follow-Up: At [facility], you and your health needs are our priority.  As part of our continuing mission to provide you with exceptional heart care, we have created designated Provider Care Teams.  These Care Teams include your primary Cardiologist (physician) and Advanced Practice Providers (APPs -  Physician Assistants and Nurse Practitioners) who all work together to provide you with the care you need, when you need it.  We recommend signing up for the patient portal called "MyChart".  Sign up information is provided on this After Visit Summary.  MyChart is used to connect with patients for Virtual Visits (Telemedicine).  Patients are able to view lab/test results, encounter notes, upcoming appointments, etc.  Non-urgent messages can be sent to your provider as well.   To learn more about what you can do with MyChart, go to [email or web address].    Your next appointment:   3 month(s)  The format for your next appointment:   In Person  Provider:   Ida Rogue, MD Or Gerrie Nordmann, NP

## 2022-01-09 ENCOUNTER — Inpatient Hospital Stay: Payer: PPO | Attending: Oncology

## 2022-01-09 DIAGNOSIS — N1831 Chronic kidney disease, stage 3a: Secondary | ICD-10-CM | POA: Diagnosis not present

## 2022-01-09 DIAGNOSIS — E538 Deficiency of other specified B group vitamins: Secondary | ICD-10-CM | POA: Diagnosis not present

## 2022-01-09 MED ORDER — CYANOCOBALAMIN 1000 MCG/ML IJ SOLN
1000.0000 ug | Freq: Once | INTRAMUSCULAR | Status: AC
  Administered 2022-01-09: 1000 ug via INTRAMUSCULAR
  Filled 2022-01-09: qty 1

## 2022-01-25 ENCOUNTER — Other Ambulatory Visit: Payer: Self-pay | Admitting: Cardiovascular Disease

## 2022-01-29 ENCOUNTER — Ambulatory Visit: Payer: PPO

## 2022-01-31 DIAGNOSIS — Z933 Colostomy status: Secondary | ICD-10-CM | POA: Diagnosis not present

## 2022-02-06 DIAGNOSIS — M48062 Spinal stenosis, lumbar region with neurogenic claudication: Secondary | ICD-10-CM | POA: Diagnosis not present

## 2022-02-06 DIAGNOSIS — M5416 Radiculopathy, lumbar region: Secondary | ICD-10-CM | POA: Diagnosis not present

## 2022-02-07 ENCOUNTER — Other Ambulatory Visit: Payer: Self-pay | Admitting: Cardiovascular Disease

## 2022-02-07 ENCOUNTER — Ambulatory Visit: Admit: 2022-02-07 | Payer: PPO

## 2022-02-07 SURGERY — COLONOSCOPY WITH PROPOFOL
Anesthesia: General

## 2022-02-10 ENCOUNTER — Inpatient Hospital Stay: Payer: PPO | Attending: Oncology

## 2022-02-10 DIAGNOSIS — E538 Deficiency of other specified B group vitamins: Secondary | ICD-10-CM

## 2022-02-10 DIAGNOSIS — D631 Anemia in chronic kidney disease: Secondary | ICD-10-CM | POA: Insufficient documentation

## 2022-02-10 DIAGNOSIS — D509 Iron deficiency anemia, unspecified: Secondary | ICD-10-CM | POA: Diagnosis not present

## 2022-02-10 DIAGNOSIS — N1831 Chronic kidney disease, stage 3a: Secondary | ICD-10-CM | POA: Diagnosis not present

## 2022-02-10 LAB — CBC WITH DIFFERENTIAL/PLATELET
Abs Immature Granulocytes: 0.03 10*3/uL (ref 0.00–0.07)
Basophils Absolute: 0 10*3/uL (ref 0.0–0.1)
Basophils Relative: 1 %
Eosinophils Absolute: 0.2 10*3/uL (ref 0.0–0.5)
Eosinophils Relative: 4 %
HCT: 37.7 % (ref 36.0–46.0)
Hemoglobin: 12.5 g/dL (ref 12.0–15.0)
Immature Granulocytes: 1 %
Lymphocytes Relative: 23 %
Lymphs Abs: 1 10*3/uL (ref 0.7–4.0)
MCH: 33.2 pg (ref 26.0–34.0)
MCHC: 33.2 g/dL (ref 30.0–36.0)
MCV: 100 fL (ref 80.0–100.0)
Monocytes Absolute: 0.4 10*3/uL (ref 0.1–1.0)
Monocytes Relative: 8 %
Neutro Abs: 2.8 10*3/uL (ref 1.7–7.7)
Neutrophils Relative %: 63 %
Platelets: 217 10*3/uL (ref 150–400)
RBC: 3.77 MIL/uL — ABNORMAL LOW (ref 3.87–5.11)
RDW: 13.4 % (ref 11.5–15.5)
WBC: 4.4 10*3/uL (ref 4.0–10.5)
nRBC: 0 % (ref 0.0–0.2)

## 2022-02-10 LAB — IRON AND TIBC
Iron: 68 ug/dL (ref 28–170)
Saturation Ratios: 22 % (ref 10.4–31.8)
TIBC: 315 ug/dL (ref 250–450)
UIBC: 247 ug/dL

## 2022-02-10 LAB — FERRITIN: Ferritin: 62 ng/mL (ref 11–307)

## 2022-02-10 LAB — VITAMIN B12: Vitamin B-12: 480 pg/mL (ref 180–914)

## 2022-02-12 MED FILL — Iron Sucrose Inj 20 MG/ML (Fe Equiv): INTRAVENOUS | Qty: 10 | Status: AC

## 2022-02-13 ENCOUNTER — Inpatient Hospital Stay: Payer: PPO

## 2022-02-13 ENCOUNTER — Encounter: Payer: Self-pay | Admitting: Oncology

## 2022-02-13 ENCOUNTER — Inpatient Hospital Stay: Payer: PPO | Attending: Oncology | Admitting: Oncology

## 2022-02-13 VITALS — BP 128/75 | HR 73 | Temp 97.9°F | Resp 17 | Wt 179.6 lb

## 2022-02-13 DIAGNOSIS — E538 Deficiency of other specified B group vitamins: Secondary | ICD-10-CM | POA: Diagnosis not present

## 2022-02-13 DIAGNOSIS — Z85048 Personal history of other malignant neoplasm of rectum, rectosigmoid junction, and anus: Secondary | ICD-10-CM

## 2022-02-13 DIAGNOSIS — H26492 Other secondary cataract, left eye: Secondary | ICD-10-CM | POA: Diagnosis not present

## 2022-02-13 DIAGNOSIS — N1831 Chronic kidney disease, stage 3a: Secondary | ICD-10-CM | POA: Diagnosis not present

## 2022-02-13 DIAGNOSIS — D631 Anemia in chronic kidney disease: Secondary | ICD-10-CM | POA: Diagnosis not present

## 2022-02-13 MED ORDER — CYANOCOBALAMIN 1000 MCG/ML IJ SOLN
1000.0000 ug | Freq: Once | INTRAMUSCULAR | Status: AC
  Administered 2022-02-13: 1000 ug via INTRAMUSCULAR
  Filled 2022-02-13: qty 1

## 2022-02-13 NOTE — Assessment & Plan Note (Signed)
Anemia of CKD and iron deficiency anemia S/p IV Venfoer.  Recommend patient to follow up with GI for discussion of repeat colonoscopy.  Labs are reviewed and discussed with patient. Hemoglobin and iron level both improved.  Hold off IV venofer.

## 2022-02-13 NOTE — Assessment & Plan Note (Signed)
#  History of anal cancer in 2017, she had a concurrent chemoradiation. Patient is now 6 years after initial diagnosis and treatments. Imaging will be obtained if clinically indicated.

## 2022-02-13 NOTE — Progress Notes (Signed)
Patient here for oncology follow-up appointment, concerns of recent heart attack, nasal drainage and chronic neuropathy

## 2022-02-13 NOTE — Progress Notes (Signed)
Hematology/Oncology Progress note [phone] [phone]      Patient Care Team: [place] Pink, MD as PCP - General (Family Medicine) Minna Merritts, MD as PCP - Cardiology (Cardiology) [name], MD as Consulting Physician (Hematology and Oncology)  ASSESSMENT & PLAN:   History of anal cancer #History of anal cancer in 2017, she had a concurrent chemoradiation. Patient is now 6 years after initial diagnosis and treatments. Imaging will be obtained if clinically indicated.  B12 deficiency #Vitamin B12 deficiency, intrinsic factor antibody and antiparietal antibody were both negative.  Continue monthly vitamin B12 injections.  Anemia in stage 3a chronic kidney disease (HCC) Anemia of CKD and iron deficiency anemia S/p IV Venfoer.  Recommend patient to follow up with GI for discussion of repeat colonoscopy.  Labs are reviewed and discussed with patient. Hemoglobin and iron level both improved.  Hold off IV venofer.    Orders Placed This Encounter  Procedures   CBC with Differential/Platelet    Standing Status:   Future    Standing Expiration Date:   02/14/2023   Comprehensive metabolic panel    Standing Status:   Future    Standing Expiration Date:   02/13/2023   Iron and TIBC    Standing Status:   Future    Standing Expiration Date:   02/14/2023   Ferritin    Standing Status:   Future    Standing Expiration Date:   02/14/2023   Vitamin B12    Standing Status:   Future    Standing Expiration Date:   02/14/2023   Follow up  B12 injection monthly.  4 months labs prior to MD + Venofer + B12 inj [same labs + cmp]  All questions were answered. The patient knows to call the clinic with any problems, questions or concerns.  [name], MD, PhD [facility] Hematology Oncology 02/13/2022   CHIEF COMPLAINTS/REASON FOR VISIT:  Follow up  history of anal cancer  HISTORY OF PRESENTING ILLNESS:   Sarah Potts is a  88 y.o.  female with PMH listed  below was seen in consultation at the request of  [place] Pink, MD  for evaluation of history of anal cancer  Patient reports history of anal cancer in 2017. I obtained previous oncology records and extensive medical record review was performed by me  Patient has noticed a lesion around her anus for about 2 years prior to seeking medical attention.  The lesion was mostly exophytic and about 4 cm in greatest dimension.  The lesion was initially felt to be confined to the perianal skin. 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  Associated heavy inflammatory infiltrate present at the base of the tumor.  Tumor does not extend to inked deep surgical margin.  Patient was seen by radiation oncology, clinical staging was T2N0 M0 and received adjuvant radiation from 7/272017-11/11/2015.  11/22/2015, PET scan showed hypermetabolic activity in the anus consistent with patient's history of anal cancer with metastatic and right iliac chain/inguinal adenopathy.  02/05/2016.  Patient finished an additional radiation treatments with Dr. Aline Brochure with attention on just to the right inguinal nodes as well as the primary lesion.  05/30/2016, follow-up PET scan showed resolution of previously noted hypermetabolic inguinal lymph node adenopathy.  Physiological bowel uptake making it difficult to tell whether the residual uptake within the rectum is physiological or residual tumor.  No waning uptake is identified.  Continued follow-up is recommended.  03/17/2018, bone scan  showed focal uptake identified involving the right first rib anteriorly at approximately the fifth rib. Patient's pain is primarily in her spine.  No point tenderness over her ribs. 03/24/2018, MRI cervical thoracic lumbar spine showed no metastatic lesion.  Patient has spondylosis.  Patient reports that she had some image scan done in 2020-not available in  current medical records that we obtained..  No images done in 2021.  She recalls that she got chemotherapy.-No records available at this point.  01/07/2021, MRI abdomen pelvis with and without contrast showed no evidence of local anal cancer recurrence.  No metastatic adenopathy in the pelvis or periaortic retroperitoneum.  Left lower quadrant colostomy without complication.  No hepatic metastasis.  03/13/2021, patient establish care with gastroenterology.  Colonoscopy was recommended for work-up of iron deficiency anemia.  Patient declined. Patient cannot tolerate oral iron supplementation and stopped it.  Previously tolerated Venofer treatments.  #Lung nodule, 10/05/2021, CT chest showed unchanged small nodules of bilateral lower lobes.  No new nodules.  Given the stability, most likely benign incidental.  INTERVAL HISTORY Sarah Potts is a 88 y.o. female who has above history reviewed by me today presents for follow up visit for anal cancer, lung nodule, vitamin B12 deficiency. Patient has been on monthly vitamin B12 injections.  She denies rectal bleeding, rectal pain, or black stool.  Fatigue level is better.   Review of Systems  Constitutional:  Negative for appetite change, chills, fatigue and fever.  HENT:   Negative for hearing loss and voice change.   Eyes:  Negative for eye problems.  Respiratory:  Negative for chest tightness and cough.   Cardiovascular:  Negative for chest pain.  Gastrointestinal:  Negative for abdominal distention, abdominal pain and blood in stool.  Endocrine: Negative for hot flashes.  Genitourinary:  Negative for difficulty urinating and frequency.   Musculoskeletal:  Negative for arthralgias.  Skin:  Negative for itching and rash.  Neurological:  Negative for extremity weakness.  Hematological:  Negative for adenopathy.  Psychiatric/Behavioral:  Negative for confusion.     MEDICAL HISTORY:  Past Medical History:  Diagnosis Date   Anal cancer ([place])     Diabetes mellitus    Hyperlipidemia    Hypertension     SURGICAL HISTORY: Past Surgical History:  Procedure Laterality Date   BLADDER SURGERY     CARDIAC CATHETERIZATION     COLOSTOMY     CORONARY ANGIOPLASTY WITH STENT PLACEMENT  03/25/2018   Stent to the LCX  2.50 mm x 83m  [employer] [id number]  heart stent     LEFT HEART CATH AND CORONARY ANGIOGRAPHY N/A 12/23/2021   Procedure: LEFT HEART CATH AND CORONARY ANGIOGRAPHY;  Surgeon: AWellington Hampshire MD;  [facility];  Service: Cardiovascular;  Laterality: N/A;   TONSILLECTOMY     uterus surgery     removed    SOCIAL HISTORY: Social History   Socioeconomic History   Marital status: Single    Spouse name: Not on file   Number of children: Not on file   Years of education: Not on file   Highest education level: Not on file  Occupational History   Not on file  Tobacco Use   Smoking status: Never   Smokeless tobacco: Never  Vaping Use   Vaping Use: Never used  Substance and Sexual Activity   Alcohol use: No   Drug use: No   Sexual activity: Not on file  Other Topics Concern  Not on file  Social History Narrative   Not on file   Social Determinants of Health   Financial Resource Strain: Not on file  Food Insecurity: Not on file  Transportation Needs: Not on file  Physical Activity: Not on file  Stress: Not on file  Social Connections: Not on file  Intimate Partner Violence: Not on file    FAMILY HISTORY: Family History  Problem Relation Age of Onset   Cancer Mother    Breast cancer Mother    Heart disease Father    Heart attack Father    Cancer Sister    Heart disease Sister    Cervical cancer Sister    Heart disease Brother     ALLERGIES:  is allergic to levofloxacin, niacin, and niacin and related.  MEDICATIONS:  Current Outpatient Medications  Medication Sig Dispense Refill   amiodarone (PACERONE) 200 MG tablet 1 tab by mouth twice a  day for two weeks, then 1 tab by mouth daily 60 tablet 1   atorvastatin (LIPITOR) 20 MG tablet TAKE 1 TABLET (20 MG TOTAL) BY MOUTH IN THE MORNING. FOR CHOLESTEROL 90 tablet 0   Cholecalciferol 25 MCG (1000 UT) tablet Take by mouth. Pt takes 3 daily     ezetimibe (ZETIA) 10 MG tablet TAKE 1 TABLET (10 MG TOTAL) BY MOUTH IN THE MORNING. FOR CHOLESTEROL 90 tablet 0   Iron-Vitamin C 65-125 MG TABS Take 1 tablet by mouth daily. 30 tablet 3   losartan (COZAAR) 25 MG tablet Take 0.5 tablets (12.5 mg total) by mouth daily. 30 tablet 0   magnesium oxide (MAG-OX) 400 MG tablet Take 400 mg by mouth daily.     metFORMIN (GLUMETZA) 1000 MG (MOD) 24 hr tablet Take 1 tablet (1,000 mg total) by mouth daily with breakfast.     metoprolol succinate (TOPROL-XL) 50 MG 24 hr tablet TAKE 1 TABLET BY MOUTH EVERY DAY 90 tablet 3   mirabegron ER (MYRBETRIQ) 50 MG TB24 tablet Take by mouth.     montelukast (SINGULAIR) 10 MG tablet Take 10 mg by mouth at bedtime.       nitroGLYCERIN (NITROSTAT) 0.4 MG SL tablet Place 1 tablet (0.4 mg total) under the tongue every 5 (five) minutes as needed for chest pain. 20 tablet 12   omega-3 acid ethyl esters (LOVAZA) 1 g capsule Take 2 g by mouth 2 (two) times daily.       omeprazole (PRILOSEC) 40 MG capsule Take 40 mg by mouth daily.       pregabalin (LYRICA) 150 MG capsule Take 150 mg by mouth 2 (two) times daily.     Rivaroxaban (XARELTO) 15 MG TABS tablet Take 1 tablet (15 mg total) by mouth daily. 90 tablet 3   TRULICITY 1.5 IR/5.1OA SOPN Inject into the skin once a week.     No current facility-administered medications for this visit.     PHYSICAL EXAMINATION: ECOG PERFORMANCE STATUS: 1 - Symptomatic but completely ambulatory Vitals:   02/13/22 1412  BP: 128/75  Pulse: 73  Resp: 17  Temp: 97.9 F (36.6 C)  SpO2: 98%   Filed Weights   02/13/22 1412  Weight: 179 lb 9.6 oz (81.5 kg)     Physical Exam Constitutional:      General: She is not in acute  distress. HENT:     Head: Normocephalic and atraumatic.  Eyes:     General: No scleral icterus. Cardiovascular:     Rate and Rhythm: Normal rate and regular rhythm.  Heart sounds: Normal heart sounds.  Pulmonary:     Effort: Pulmonary effort is normal. No respiratory distress.     Breath sounds: No wheezing.  Abdominal:     General: Bowel sounds are normal. There is no distension.     Palpations: Abdomen is soft.     Comments: Colostomy  Musculoskeletal:        General: No deformity. Normal range of motion.     Cervical back: Normal range of motion and neck supple.  Skin:    General: Skin is warm and dry.     Findings: No erythema or rash.  Neurological:     Mental Status: She is alert and oriented to person, place, and time. Mental status is at baseline.     Cranial Nerves: No cranial nerve deficit.     Coordination: Coordination normal.  Psychiatric:        Mood and Affect: Mood normal.     LABORATORY DATA:  I have reviewed the data as listed    Latest Ref Rng & Units 02/10/2022    9:34 AM 12/23/2021    6:15 AM 12/21/2021    5:06 AM  CBC  WBC 4.0 - 10.5 K/uL 4.4  3.3  4.4   Hemoglobin 12.0 - 15.0 g/dL 12.5  10.4  10.2   Hematocrit 36.0 - 46.0 % 37.7  31.0  31.2   Platelets 150 - 400 K/uL 217  185  189       Latest Ref Rng & Units 12/24/2021    7:49 AM 12/21/2021    5:06 AM 12/20/2021    1:02 PM  CMP  Glucose 70 - 99 mg/dL 167  112  162   BUN 8 - 23 mg/dL '20  19  20   '$ Creatinine 0.44 - 1.00 mg/dL 1.23  1.22  1.29   Sodium 135 - 145 mmol/L 138  139  138   Potassium 3.5 - 5.1 mmol/L 4.8  4.7  4.9   Chloride 98 - 111 mmol/L 105  106  105   CO2 22 - 32 mmol/L '25  24  24   '$ Calcium 8.9 - 10.3 mg/dL 9.1  8.9  9.4     Iron/TIBC/Ferritin/ %Sat    Component Value Date/Time   IRON 68 02/10/2022 0934   TIBC 315 02/10/2022 0934   FERRITIN 62 02/10/2022 0934   IRONPCTSAT 22 02/10/2022 0934      RADIOGRAPHIC STUDIES: I have personally reviewed the radiological  images as listed and agreed with the findings in the report. No results found.

## 2022-02-13 NOTE — Assessment & Plan Note (Signed)
#  Vitamin B12 deficiency, intrinsic factor antibody and antiparietal antibody were both negative.  Continue monthly vitamin B12 injections.

## 2022-02-13 NOTE — Assessment & Plan Note (Deleted)
#  History of anal cancer in 2017, she had a concurrent chemoradiation. Patient is now 6 years after initial diagnosis and treatments. Imaging will be obtained if clinically indicated.

## 2022-02-13 NOTE — Progress Notes (Signed)
No Venofer today. Vitamin B 12 given. Tolerated well. No concerns. Discharged. Stable

## 2022-02-17 DIAGNOSIS — I10 Essential (primary) hypertension: Secondary | ICD-10-CM | POA: Diagnosis not present

## 2022-02-17 DIAGNOSIS — J029 Acute pharyngitis, unspecified: Secondary | ICD-10-CM | POA: Diagnosis not present

## 2022-02-17 DIAGNOSIS — E1140 Type 2 diabetes mellitus with diabetic neuropathy, unspecified: Secondary | ICD-10-CM | POA: Diagnosis not present

## 2022-02-17 DIAGNOSIS — J45909 Unspecified asthma, uncomplicated: Secondary | ICD-10-CM | POA: Diagnosis not present

## 2022-02-17 DIAGNOSIS — R058 Other specified cough: Secondary | ICD-10-CM | POA: Diagnosis not present

## 2022-02-17 DIAGNOSIS — F3341 Major depressive disorder, recurrent, in partial remission: Secondary | ICD-10-CM | POA: Diagnosis not present

## 2022-02-17 DIAGNOSIS — D509 Iron deficiency anemia, unspecified: Secondary | ICD-10-CM | POA: Diagnosis not present

## 2022-02-17 DIAGNOSIS — N1832 Chronic kidney disease, stage 3b: Secondary | ICD-10-CM | POA: Diagnosis not present

## 2022-02-24 ENCOUNTER — Telehealth: Payer: Self-pay | Admitting: Cardiovascular Disease

## 2022-02-24 NOTE — Telephone Encounter (Signed)
Spoke w/ pt. She reports that she was previously on losartan 100 mg daily and her BP seemed to be under control. She was discharged from hospital 12/24/21 w/ new rx for losartan 25 mg, 1/2 tab daily and her BP is running high. She would like to know if Dr. Rockey Situ would like her to resume previous dosage or if we can send in refill for 25 mg and take a whole tab daily. Advised her that I will make Dr. Rockey Situ aware and call her back w/ his recommendation.

## 2022-02-24 NOTE — Telephone Encounter (Signed)
Pt called for update.  Advised her that Dr. Rockey Situ is in clinic and has not responded. She reports that she is out of losartan 25 mg and has no refills, but she has have 100 mg on hand. Advised her I will call her as soon as Dr. Rockey Situ reviews.

## 2022-02-24 NOTE — Telephone Encounter (Signed)
Pt c/o medication issue:  1. Name of Medication: losartan (COZAAR) 25 MG tablet   2. How are you currently taking this medication (dosage and times per day)? Half tab by mouth daily.   3. Are you having a reaction (difficulty breathing--STAT)? No   4. What is your medication issue? Patient states that this medication dosage has been lowered and potasium was taken out. She would like to discuss this. She says her BP is up in the morning before she takes medication.  Morning bp readings before medication:  163/89 160/90 144/86 151/85

## 2022-02-24 NOTE — Telephone Encounter (Signed)
Patient was calling back for update. Please advise  

## 2022-02-25 MED ORDER — LOSARTAN POTASSIUM 100 MG PO TABS: 100.0000 mg | ORAL_TABLET | Freq: Every day | ORAL | 3 refills | Status: DC

## 2022-02-25 NOTE — Telephone Encounter (Signed)
Minna Merritts, MD  Sent: Tue February 25, 2022  7:57 AM  To: [facility]         Message  Would go back on losartan 100 daily as she was taking previously  Thx  TGollan

## 2022-02-25 NOTE — Telephone Encounter (Signed)
I spoke with the patient. I have advised her of Dr. Donivan Scull recommendation to resume losartan 100 mg once daily as she was previously taking.  The patient voices understanding and is agreeable.  I did inquire if she needed this refilled to the pharmacy and she advised she had the RX at home for losartan 100 mg tablets, but if she needed it refilled she would contact the pharmacy and have them reach out to [place].   She was very appreciative of the call back.

## 2022-02-28 DIAGNOSIS — H26492 Other secondary cataract, left eye: Secondary | ICD-10-CM | POA: Diagnosis not present

## 2022-03-17 ENCOUNTER — Inpatient Hospital Stay: Payer: PPO | Attending: Oncology

## 2022-03-18 ENCOUNTER — Telehealth: Payer: Self-pay | Admitting: *Deleted

## 2022-03-18 NOTE — Patient Outreach (Signed)
  Care Coordination   03/18/2022 Name: RICHELLE GLICK MRN: [id number] DOB: 03/09/1933   Care Coordination Outreach Attempts:  An unsuccessful telephone outreach was attempted today to offer the patient information about available care coordination services as a benefit of their health plan.   Follow Up Plan:  Additional outreach attempts will be made to offer the patient care coordination information and services.   Encounter Outcome:  No Answer   Care Coordination Interventions:  No, not indicated    Valente David, RN, MSN, [facility] Care Management Care Management Coordinator [phone]

## 2022-03-20 ENCOUNTER — Telehealth: Payer: Self-pay | Admitting: Cardiovascular Disease

## 2022-03-20 NOTE — Telephone Encounter (Signed)
Pt c/o BP issue: STAT if pt c/o blurred vision, one-sided weakness or slurred speech  1. What are your last 5 BP readings? 206/106 this morning   2. Are you having any other symptoms (ex. Dizziness, headache, blurred vision, passed out)? Headaches  3. What is your BP issue? Pt states that she had a high bp reading this morning and would like to discuss this with someone.

## 2022-03-20 NOTE — Telephone Encounter (Signed)
Pt called reporting this morning she woke up with a headache and BP was 206/106 HR 68. Pt reported she had not taking her morning medication. She report BP eventually decreased to 103/54 HR 67 and currently 122/76 HR 66. Pt reported she don't typically check her BP daily but on 12/4 also woke up with a headache and BP was 173/109.  Nurse recommended pt monitor BP twice a day to follow trend. Nurse will also forward message to MD for any additional recommendations.

## 2022-03-21 MED ORDER — AMIODARONE HCL 200 MG PO TABS: 200.0000 mg | ORAL_TABLET | Freq: Every day | ORAL | 3 refills | Status: DC

## 2022-03-21 NOTE — Telephone Encounter (Signed)
Pt made aware of MD's recommendations and verbalized understanding.   Minna Merritts, MD  [facility] [place] hours ago (4:53 PM)    Typically blood pressure runs low as demonstrated on prior clinic visit It is possible her headache was the reason for high blood pressure Agree with continue to monitor blood pressure at home when she does not have pain or headache and call us if numbers run high Thx TGollan

## 2022-03-24 ENCOUNTER — Telehealth: Payer: Self-pay | Admitting: *Deleted

## 2022-03-24 NOTE — Patient Outreach (Signed)
  Care Coordination   03/24/2022 Name: BRINDA FOCHT MRN: [id number] DOB: 04/06/1933   Care Coordination Outreach Attempts:  A second unsuccessful outreach was attempted today to offer the patient with information about available care coordination services as a benefit of their health plan.     Follow Up Plan:  Additional outreach attempts will be made to offer the patient care coordination information and services.   Encounter Outcome:  No Answer   Care Coordination Interventions:  No, not indicated    Valente David, RN, MSN, [facility] Care Management Care Management Coordinator [phone]

## 2022-03-25 DIAGNOSIS — E1140 Type 2 diabetes mellitus with diabetic neuropathy, unspecified: Secondary | ICD-10-CM | POA: Diagnosis not present

## 2022-03-25 DIAGNOSIS — I10 Essential (primary) hypertension: Secondary | ICD-10-CM | POA: Diagnosis not present

## 2022-03-27 ENCOUNTER — Telehealth: Payer: Self-pay | Admitting: *Deleted

## 2022-03-27 DIAGNOSIS — M48062 Spinal stenosis, lumbar region with neurogenic claudication: Secondary | ICD-10-CM | POA: Diagnosis not present

## 2022-03-27 DIAGNOSIS — M25579 Pain in unspecified ankle and joints of unspecified foot: Secondary | ICD-10-CM | POA: Diagnosis not present

## 2022-03-27 DIAGNOSIS — M65341 Trigger finger, right ring finger: Secondary | ICD-10-CM | POA: Diagnosis not present

## 2022-03-27 DIAGNOSIS — M1712 Unilateral primary osteoarthritis, left knee: Secondary | ICD-10-CM | POA: Diagnosis not present

## 2022-03-27 DIAGNOSIS — M5416 Radiculopathy, lumbar region: Secondary | ICD-10-CM | POA: Diagnosis not present

## 2022-03-27 NOTE — Patient Outreach (Signed)
  Care Coordination   03/27/2022 Name: Sarah Potts MRN: [id number] DOB: 1933/03/15   Care Coordination Outreach Attempts:  A third unsuccessful outreach was attempted today to offer the patient with information about available care coordination services as a benefit of their health plan.   Follow Up Plan:  No further outreach attempts will be made at this time. We have been unable to contact the patient to offer or enroll patient in care coordination services  Encounter Outcome:  No Answer   Care Coordination Interventions:  No, not indicated    Valente David, RN, MSN, [facility] Care Management Care Management Coordinator [phone]

## 2022-04-01 DIAGNOSIS — B372 Candidiasis of skin and nail: Secondary | ICD-10-CM | POA: Diagnosis not present

## 2022-04-01 DIAGNOSIS — F32A Depression, unspecified: Secondary | ICD-10-CM | POA: Diagnosis not present

## 2022-04-01 DIAGNOSIS — I10 Essential (primary) hypertension: Secondary | ICD-10-CM | POA: Diagnosis not present

## 2022-04-01 DIAGNOSIS — E118 Type 2 diabetes mellitus with unspecified complications: Secondary | ICD-10-CM | POA: Diagnosis not present

## 2022-04-01 DIAGNOSIS — E538 Deficiency of other specified B group vitamins: Secondary | ICD-10-CM | POA: Diagnosis not present

## 2022-04-01 NOTE — Progress Notes (Signed)
Cardiology Clinic Note   Patient Name: Sarah Potts Date of Encounter: 04/08/2022  Primary Care Provider:  [place] Pink, MD Primary Cardiologist:  Ida Rogue, MD  Patient Profile    88 year old female with past medical history of paroxysmal atrial fibrillation, diabetes, hyperlipidemia, essential hypertension, coronary artery disease status post PCI, and anal cancer s/p colostomy, who is here today for follow-up on her coronary artery disease.  Past Medical History    Past Medical History:  Diagnosis Date   Anal cancer ([place])    Diabetes mellitus    Hyperlipidemia    Hypertension    Past Surgical History:  Procedure Laterality Date   BLADDER SURGERY     CARDIAC CATHETERIZATION     COLOSTOMY     CORONARY ANGIOPLASTY WITH STENT PLACEMENT  03/25/2018   Stent to the LCX  2.50 mm x 42m  [employer] RE[employer] [id number]  heart stent     LEFT HEART CATH AND CORONARY ANGIOGRAPHY N/A 12/23/2021   Procedure: LEFT HEART CATH AND CORONARY ANGIOGRAPHY;  Surgeon: AWellington Hampshire MD;  [facility];  Service: Cardiovascular;  Laterality: N/A;   TONSILLECTOMY     uterus surgery     removed    Allergies  Allergies  Allergen Reactions   Levofloxacin Nausea Only    Patient reports that she thinks that she could take this medication.    Niacin Other (See Comments)    Hot, Flushed   Niacin And Related Other (See Comments)    Pt states she get real red and hot.    History of Present Illness    Sarah Dixsonis an 88year old female with previously mentioned past medical history significant for paroxysmal atrial fibrillation that recently required hospitalization for RVR, type 2 diabetes, hyperlipidemia, essential hypertension, and coronary artery disease status post PCI/DES, and anal cancer s/p colostomy.   [facility] of chest pain.  Pain persisted and so she  received an aspirin and sublingual nitro in the emergency department.  Blood pressure was stable in the 1789Fto 1810Fsystolic.  Heart rate was in the 70s.  She was afebrile.  EKG revealed sinus rhythm with a rate of 74 with first-degree AV block and nonspecific IVCD, nonspecific lateral ST and T wave changes.  Initial high-sensitivity troponin was 359.  She was subsequently placed on a heparin drip with improvement but did not resolve her chest discomfort.  She underwent repeat echocardiogram which showed LVEF dropped to 40-45% with regional wall motion abnormality, moderate mitral annular calcification.  On 12/23/2021 she underwent left heart catheterization which revealed mild to moderate nonobstructive coronary artery disease.  Patent left circumflex stent with no significant in-stent restenosis.  Moderately severely calcified coronary arteries.  Left ventricular angiography was not performed due to chronic kidney disease.  EF was mildly reduced by echocardiogram.  Was likely thought she had stress-induced cardiomyopathy and was continued with medical therapy and resumed her Xarelto.  She was last seen in clinic on 01/03/2022 where she denied any current chest pain shortness of breath and dyspnea on exertion.  She was continued on her current medication regimen without any further testing that was ordered.  She will continue to follow-up with oncology for history of anal cancer at that time was advised to follow-up with GI for discussion of repeat colonoscopy.  She returns to clinic today stating that she has been well. She has  noted over the last few weeks that has she tries to increase her activity she becomes short of breath, more than her usual. That is only when she is rushing. She denies any chest pain, chest pressure, or palpitations. She endorses some occasional swelling to her bilateral lower extremities. She denies any recent hospitalizations or visits to the emergency department.   Home Medications     Current Outpatient Medications  Medication Sig Dispense Refill   amiodarone (PACERONE) 200 MG tablet Take 1 tablet (200 mg total) by mouth daily. 90 tablet 3   atorvastatin (LIPITOR) 20 MG tablet TAKE 1 TABLET (20 MG TOTAL) BY MOUTH IN THE MORNING. FOR CHOLESTEROL 90 tablet 0   Cholecalciferol 25 MCG (1000 UT) tablet Take by mouth. Pt takes 3 daily     ezetimibe (ZETIA) 10 MG tablet TAKE 1 TABLET (10 MG TOTAL) BY MOUTH IN THE MORNING. FOR CHOLESTEROL 90 tablet 0   Iron-Vitamin C 65-125 MG TABS Take 1 tablet by mouth daily. 30 tablet 3   magnesium oxide (MAG-OX) 400 MG tablet Take 400 mg by mouth daily.     metFORMIN (GLUMETZA) 1000 MG (MOD) 24 hr tablet Take 1 tablet (1,000 mg total) by mouth daily with breakfast.     metoprolol succinate (TOPROL-XL) 50 MG 24 hr tablet TAKE 1 TABLET BY MOUTH EVERY DAY 90 tablet 3   mirabegron ER (MYRBETRIQ) 50 MG TB24 tablet Take by mouth.     montelukast (SINGULAIR) 10 MG tablet Take 10 mg by mouth at bedtime.       nitroGLYCERIN (NITROSTAT) 0.4 MG SL tablet Place 1 tablet (0.4 mg total) under the tongue every 5 (five) minutes as needed for chest pain. 20 tablet 12   omega-3 acid ethyl esters (LOVAZA) 1 g capsule Take 2 g by mouth 2 (two) times daily.       omeprazole (PRILOSEC) 40 MG capsule Take 40 mg by mouth daily.       pregabalin (LYRICA) 150 MG capsule Take 150 mg by mouth 2 (two) times daily.     Rivaroxaban (XARELTO) 15 MG TABS tablet Take 1 tablet (15 mg total) by mouth daily. 90 tablet 3   TRULICITY 1.5 DD/2.2GU SOPN Inject into the skin once a week.     losartan (COZAAR) 100 MG tablet Take 1 tablet (100 mg total) by mouth daily. 90 tablet 3   No current facility-administered medications for this visit.     Family History    Family History  Problem Relation Age of Onset   Cancer Mother    Breast cancer Mother    Heart disease Father    Heart attack Father    Cancer Sister    Heart disease Sister    Cervical cancer Sister    Heart  disease Brother    She indicated that her mother is deceased. She indicated that her father is deceased. She indicated that her sister is deceased. She indicated that her brother is deceased. She indicated that her son is alive.  Social History    Social History   Socioeconomic History   Marital status: Single    Spouse name: Not on file   Number of children: Not on file   Years of education: Not on file   Highest education level: Not on file  Occupational History   Not on file  Tobacco Use   Smoking status: Never   Smokeless tobacco: Never  Vaping Use   Vaping Use: Never used  Substance and Sexual Activity  Alcohol use: No   Drug use: No   Sexual activity: Not on file  Other Topics Concern   Not on file  Social History Narrative   Not on file   Social Determinants of Health   Financial Resource Strain: Not on file  Food Insecurity: Not on file  Transportation Needs: Not on file  Physical Activity: Not on file  Stress: Not on file  Social Connections: Not on file  Intimate Partner Violence: Not on file     Review of Systems    General:  No chills, fever, night sweats or weight changes.  Endorses fatigue Cardiovascular:  No chest pain, endorses dyspnea on exertion, endorses occasional peripheral edema, but denies orthopnea, palpitations, paroxysmal nocturnal dyspnea. Dermatological: No rash, lesions/masses Respiratory: No cough, endorses exertional dyspnea Urologic: No hematuria, dysuria Abdominal:   No nausea, vomiting, diarrhea, bright red blood per rectum, melena, or hematemesis Neurologic:  No visual changes, wkns, changes in mental status.  Endorses balance issues All other systems reviewed and are otherwise negative except as noted above.   Physical Exam    VS:  BP 111/65 (BP Location: Left Arm, Patient Position: Sitting, Cuff Size: Normal)   Pulse 66   Ht '5\' 2"'$  (1.575 m)   Wt 179 lb 6.4 oz (81.4 kg)   SpO2 95%   BMI 32.81 kg/m  , BMI Body mass index  is 32.81 kg/m.     GEN: Well nourished, well developed, in no acute distress. HEENT: normal.  Glasses on. Neck: Supple, no JVD, carotid bruits, or masses. Cardiac: RRR, no murmurs, rubs, or gallops. No clubbing, cyanosis, edema.  Radials 2+/PT 2+ and equal bilaterally.  Respiratory:  Respirations regular and unlabored, clear to auscultation bilaterally. GI: Soft, nontender, nondistended, BS + x 4.  Colostomy is intact. MS: no deformity or atrophy. Skin: warm and dry, no rash. Neuro:  Strength and sensation are intact. Psych: Normal affect.  Accessory Clinical Findings    ECG personally reviewed by me today-sinus rhythm with rate of 66, first-degree AV block, chronic left bundle branch block, and LVH- No acute changes  Lab Results  Component Value Date   WBC 4.4 02/10/2022   HGB 12.5 02/10/2022   HCT 37.7 02/10/2022   MCV 100.0 02/10/2022   PLT 217 02/10/2022   Lab Results  Component Value Date   CREATININE 1.23 (H) 12/24/2021   BUN 20 12/24/2021   NA 138 12/24/2021   K 4.8 12/24/2021   CL 105 12/24/2021   CO2 25 12/24/2021   Lab Results  Component Value Date   ALT 17 10/09/2021   AST 23 10/09/2021   ALKPHOS 44 10/09/2021   BILITOT 0.8 10/09/2021   Lab Results  Component Value Date   CHOL 123 12/21/2021   HDL 42 12/21/2021   LDLCALC 46 12/21/2021   TRIG 177 (H) 12/21/2021   CHOLHDL 2.9 12/21/2021    Lab Results  Component Value Date   HGBA1C 7.2 (H) 11/30/2021    Assessment & Plan   1.  Paroxysmal atrial fibrillation with sinus rhythm noted on EKG today.  Atrial fibrillation dates back to 2016.  She is continued on rivaroxaban 15 mg daily for CHA2DS2-VASc score of at least 7 and Toprol-XL 50 mg daily for rate control as well as amiodarone 200 mg daily.  2.  Mild to moderate nonobstructive coronary artery disease with stable angina remains chest pain-free today.  She is continued on Xarelto and lieu of aspirin continued on atorvastatin 20 mg daily and  ezetimibe 10 mg daily.  She continues to have Nitrostat 0.4 mg sublingual but has not required any doses.  3.  Essential hypertension with blood pressure today 111/65.  She is continued on her losartan 100 mg daily which she is requesting a refill sent to the pharmacy of choice today.  As well as Toprol-XL 50 mg daily.  She has also been encouraged to monitor her pressure at home as she has been doing.  4.  Cardiomyopathy with recent LVEF 40-45% with mildly decreased function during her last hospitalization likely stress-induced cardiomyopathy.  She has having exertional shortness of breath today only when she is walking fast.  She has been scheduled for limited echocardiogram to reevaluate her LVEF.  Lungs are clear on exam, respirations are unlabored however, she appears euvolemic today.  She has been encouraged to continue to wear daily and limit her sodium intake.  5.  Mixed hyperlipidemia with a calculated LDL of 95 on 03/25/2022.  She is continued on atorvastatin 20 mg daily and ezetimibe 10 mg daily.  As well as Lovaza 2 g twice daily.  This continues to be monitored by her PCP.  She has been encouraged to continue with dietary changes she states that she has had dietary discretions over the holidays.  6.  Chronic kidney disease with serum creatinine of 1.2 03/25/2022.  This returned to patient's baseline.  This continues to be monitored by PCP.  7.  Type 2 diabetes with hemoglobin A1c of 7 on 03/18/2022.  She is continued on her Trulicity.  She is also continued on metformin.  This continues to be managed by PCP.  8.  Disposition patient return to clinic to see MD/APP in 3 months or sooner if needed after limited echocardiogram is completed  Maxcine Strong, NP 04/08/2022, 10:44 AM

## 2022-04-02 DIAGNOSIS — M65341 Trigger finger, right ring finger: Secondary | ICD-10-CM | POA: Diagnosis not present

## 2022-04-08 ENCOUNTER — Encounter: Payer: Self-pay | Admitting: Cardiology

## 2022-04-08 ENCOUNTER — Ambulatory Visit: Payer: PPO | Attending: Cardiology | Admitting: Cardiology

## 2022-04-08 VITALS — BP 111/65 | HR 66 | Ht 62.0 in | Wt 179.4 lb

## 2022-04-08 DIAGNOSIS — I25118 Atherosclerotic heart disease of native coronary artery with other forms of angina pectoris: Secondary | ICD-10-CM

## 2022-04-08 DIAGNOSIS — N1831 Chronic kidney disease, stage 3a: Secondary | ICD-10-CM | POA: Diagnosis not present

## 2022-04-08 DIAGNOSIS — E782 Mixed hyperlipidemia: Secondary | ICD-10-CM | POA: Diagnosis not present

## 2022-04-08 DIAGNOSIS — E1159 Type 2 diabetes mellitus with other circulatory complications: Secondary | ICD-10-CM

## 2022-04-08 DIAGNOSIS — I428 Other cardiomyopathies: Secondary | ICD-10-CM | POA: Diagnosis not present

## 2022-04-08 DIAGNOSIS — I255 Ischemic cardiomyopathy: Secondary | ICD-10-CM

## 2022-04-08 DIAGNOSIS — I480 Paroxysmal atrial fibrillation: Secondary | ICD-10-CM | POA: Diagnosis not present

## 2022-04-08 DIAGNOSIS — I10 Essential (primary) hypertension: Secondary | ICD-10-CM | POA: Diagnosis not present

## 2022-04-08 MED ORDER — LOSARTAN POTASSIUM 100 MG PO TABS: 100.0000 mg | ORAL_TABLET | Freq: Every day | ORAL | 3 refills | Status: DC

## 2022-04-08 NOTE — Patient Instructions (Signed)
Medication Instructions:   NONE  *If you need a refill on your cardiac medications before your next appointment, please call your pharmacy*   Lab Work: NONE If you have labs (blood work) drawn today and your tests are completely normal, you will receive your results only by: [place] (if you have MyChart) OR A paper copy in the mail If you have any lab test that is abnormal or we need to change your treatment, we will call you to review the results.   Testing/Procedures:  Your physician has requested that you have an echocardiogram. Echocardiography is a painless test that uses sound waves to create images of your heart. It provides your doctor with information about the size and shape of your heart and how well your heart's chambers and valves are working. This procedure takes approximately one hour. There are no restrictions for this procedure. Please do NOT wear cologne, perfume, aftershave, or lotions (deodorant is allowed). Please arrive 15 minutes prior to your appointment time.    Follow-Up: At [facility], you and your health needs are our priority.  As part of our continuing mission to provide you with exceptional heart care, we have created designated Provider Care Teams.  These Care Teams include your primary Cardiologist (physician) and Advanced Practice Providers (APPs -  Physician Assistants and Nurse Practitioners) who all work together to provide you with the care you need, when you need it.  We recommend signing up for the patient portal called "MyChart".  Sign up information is provided on this After Visit Summary.  MyChart is used to connect with patients for Virtual Visits (Telemedicine).  Patients are able to view lab/test results, encounter notes, upcoming appointments, etc.  Non-urgent messages can be sent to your provider as well.   To learn more about what you can do with MyChart, go to [email or web address].    Your next appointment:   3  month(s)  The format for your next appointment:   In Person  Provider:   You may see Ida Rogue, MD or one of the following Advanced Practice Providers on your designated Care Team:   Murray Hodgkins, NP Christell Faith, PA-C Cadence Kathlen Mody, PA-C Gerrie Nordmann, NP      Important Information About Sugar

## 2022-04-10 DIAGNOSIS — H02831 Dermatochalasis of right upper eyelid: Secondary | ICD-10-CM | POA: Diagnosis not present

## 2022-04-17 ENCOUNTER — Inpatient Hospital Stay: Payer: HMO | Attending: Oncology

## 2022-04-17 DIAGNOSIS — G4489 Other headache syndrome: Secondary | ICD-10-CM | POA: Diagnosis not present

## 2022-04-17 DIAGNOSIS — I10 Essential (primary) hypertension: Secondary | ICD-10-CM | POA: Diagnosis not present

## 2022-04-22 ENCOUNTER — Telehealth: Payer: Self-pay | Admitting: Cardiology

## 2022-04-22 ENCOUNTER — Ambulatory Visit: Payer: PPO | Attending: Cardiology

## 2022-04-22 DIAGNOSIS — I255 Ischemic cardiomyopathy: Secondary | ICD-10-CM

## 2022-04-22 LAB — ECHOCARDIOGRAM LIMITED
Calc EF: 69.6 %
S' Lateral: 2.5 cm
Single Plane A2C EF: 68.5 %
Single Plane A4C EF: 70.3 %

## 2022-04-22 MED ORDER — PERFLUTREN LIPID MICROSPHERE
1.0000 mL | INTRAVENOUS | Status: AC | PRN
  Administered 2022-04-22: 2 mL via INTRAVENOUS

## 2022-04-22 NOTE — Telephone Encounter (Signed)
Sarah Nordmann, NP 04/22/2022  4:18 PM EST     Heart squeeze has improved at 60-65%, previous study was 40-45%, no changes in the heart valves. Improved study since being out of atrial fibrillation.

## 2022-04-22 NOTE — Telephone Encounter (Signed)
Attempted to call the patient. No answer- I left a message to please call back.  

## 2022-04-22 NOTE — Progress Notes (Signed)
Heart squeeze has improved at 60-65%, previous study was 40-45%, no changes in the heart valves. Improved study since being out of atrial fibrillation.

## 2022-04-24 NOTE — Telephone Encounter (Signed)
Reviewed results with patient and she verbalized understanding with no further questions at this time.  

## 2022-05-02 ENCOUNTER — Other Ambulatory Visit: Payer: Self-pay | Admitting: Cardiovascular Disease

## 2022-05-12 ENCOUNTER — Encounter: Payer: Self-pay | Admitting: Oncology

## 2022-05-13 DIAGNOSIS — Z933 Colostomy status: Secondary | ICD-10-CM | POA: Diagnosis not present

## 2022-05-19 ENCOUNTER — Inpatient Hospital Stay: Payer: HMO | Attending: Oncology

## 2022-05-20 DIAGNOSIS — M5416 Radiculopathy, lumbar region: Secondary | ICD-10-CM | POA: Diagnosis not present

## 2022-05-20 DIAGNOSIS — M48062 Spinal stenosis, lumbar region with neurogenic claudication: Secondary | ICD-10-CM | POA: Diagnosis not present

## 2022-05-29 DIAGNOSIS — J301 Allergic rhinitis due to pollen: Secondary | ICD-10-CM | POA: Diagnosis not present

## 2022-05-29 DIAGNOSIS — H903 Sensorineural hearing loss, bilateral: Secondary | ICD-10-CM | POA: Diagnosis not present

## 2022-05-29 DIAGNOSIS — H6983 Other specified disorders of Eustachian tube, bilateral: Secondary | ICD-10-CM | POA: Diagnosis not present

## 2022-05-29 DIAGNOSIS — H6982 Other specified disorders of Eustachian tube, left ear: Secondary | ICD-10-CM | POA: Diagnosis not present

## 2022-06-05 DIAGNOSIS — E538 Deficiency of other specified B group vitamins: Secondary | ICD-10-CM | POA: Diagnosis not present

## 2022-06-05 DIAGNOSIS — I10 Essential (primary) hypertension: Secondary | ICD-10-CM | POA: Diagnosis not present

## 2022-06-05 DIAGNOSIS — E118 Type 2 diabetes mellitus with unspecified complications: Secondary | ICD-10-CM | POA: Diagnosis not present

## 2022-06-12 DIAGNOSIS — Z933 Colostomy status: Secondary | ICD-10-CM | POA: Diagnosis not present

## 2022-06-16 ENCOUNTER — Telehealth: Payer: Self-pay | Admitting: Cardiovascular Disease

## 2022-06-16 ENCOUNTER — Inpatient Hospital Stay: Payer: HMO

## 2022-06-16 MED ORDER — RIVAROXABAN 15 MG PO TABS: 15.0000 mg | ORAL_TABLET | Freq: Every day | ORAL | 0 refills | Status: DC

## 2022-06-16 NOTE — Telephone Encounter (Signed)
*  STAT* If patient is at the pharmacy, call can be transferred to refill team.   1. Which medications need to be refilled? (please list name of each medication and dose if known) Xarelto  2. Which pharmacy/location (including street and city if local pharmacy) is medication to be sent to? [facility] [place],  [place]. Do they need a 30 day or 90 day supply? 90 days and refills- need this asap- been out for a week

## 2022-06-17 ENCOUNTER — Inpatient Hospital Stay: Payer: HMO | Attending: Oncology

## 2022-06-17 DIAGNOSIS — Z85048 Personal history of other malignant neoplasm of rectum, rectosigmoid junction, and anus: Secondary | ICD-10-CM | POA: Insufficient documentation

## 2022-06-17 DIAGNOSIS — E538 Deficiency of other specified B group vitamins: Secondary | ICD-10-CM | POA: Diagnosis not present

## 2022-06-17 DIAGNOSIS — E1122 Type 2 diabetes mellitus with diabetic chronic kidney disease: Secondary | ICD-10-CM | POA: Diagnosis not present

## 2022-06-17 DIAGNOSIS — N1831 Chronic kidney disease, stage 3a: Secondary | ICD-10-CM | POA: Insufficient documentation

## 2022-06-17 DIAGNOSIS — D631 Anemia in chronic kidney disease: Secondary | ICD-10-CM | POA: Diagnosis not present

## 2022-06-17 DIAGNOSIS — I129 Hypertensive chronic kidney disease with stage 1 through stage 4 chronic kidney disease, or unspecified chronic kidney disease: Secondary | ICD-10-CM | POA: Insufficient documentation

## 2022-06-17 LAB — VITAMIN B12: Vitamin B-12: 624 pg/mL (ref 180–914)

## 2022-06-17 LAB — CBC WITH DIFFERENTIAL/PLATELET
Abs Immature Granulocytes: 0.06 10*3/uL (ref 0.00–0.07)
Basophils Absolute: 0.1 10*3/uL (ref 0.0–0.1)
Basophils Relative: 1 %
Eosinophils Absolute: 0.2 10*3/uL (ref 0.0–0.5)
Eosinophils Relative: 3 %
HCT: 36.6 % (ref 36.0–46.0)
Hemoglobin: 12.2 g/dL (ref 12.0–15.0)
Immature Granulocytes: 1 %
Lymphocytes Relative: 19 %
Lymphs Abs: 1.1 10*3/uL (ref 0.7–4.0)
MCH: 33.1 pg (ref 26.0–34.0)
MCHC: 33.3 g/dL (ref 30.0–36.0)
MCV: 99.2 fL (ref 80.0–100.0)
Monocytes Absolute: 0.5 10*3/uL (ref 0.1–1.0)
Monocytes Relative: 8 %
Neutro Abs: 3.9 10*3/uL (ref 1.7–7.7)
Neutrophils Relative %: 68 %
Platelets: 257 10*3/uL (ref 150–400)
RBC: 3.69 MIL/uL — ABNORMAL LOW (ref 3.87–5.11)
RDW: 13.5 % (ref 11.5–15.5)
WBC: 5.8 10*3/uL (ref 4.0–10.5)
nRBC: 0 % (ref 0.0–0.2)

## 2022-06-17 LAB — COMPREHENSIVE METABOLIC PANEL
ALT: 46 U/L — ABNORMAL HIGH (ref 0–44)
AST: 44 U/L — ABNORMAL HIGH (ref 15–41)
Albumin: 4 g/dL (ref 3.5–5.0)
Alkaline Phosphatase: 47 U/L (ref 38–126)
Anion gap: 8 (ref 5–15)
BUN: 24 mg/dL — ABNORMAL HIGH (ref 8–23)
CO2: 22 mmol/L (ref 22–32)
Calcium: 8.3 mg/dL — ABNORMAL LOW (ref 8.9–10.3)
Chloride: 105 mmol/L (ref 98–111)
Creatinine, Ser: 1.7 mg/dL — ABNORMAL HIGH (ref 0.44–1.00)
GFR, Estimated: 29 mL/min — ABNORMAL LOW (ref >60)
Glucose, Bld: 117 mg/dL — ABNORMAL HIGH (ref 70–99)
Potassium: 4.6 mmol/L (ref 3.5–5.1)
Sodium: 135 mmol/L (ref 135–145)
Total Bilirubin: 0.4 mg/dL (ref 0.3–1.2)
Total Protein: 7.3 g/dL (ref 6.5–8.1)

## 2022-06-17 LAB — IRON AND TIBC
Iron: 90 ug/dL (ref 28–170)
Saturation Ratios: 27 % (ref 10.4–31.8)
TIBC: 339 ug/dL (ref 250–450)
UIBC: 249 ug/dL

## 2022-06-17 LAB — FERRITIN: Ferritin: 45 ng/mL (ref 11–307)

## 2022-06-18 DIAGNOSIS — M25579 Pain in unspecified ankle and joints of unspecified foot: Secondary | ICD-10-CM | POA: Diagnosis not present

## 2022-06-18 DIAGNOSIS — M48062 Spinal stenosis, lumbar region with neurogenic claudication: Secondary | ICD-10-CM | POA: Diagnosis not present

## 2022-06-18 DIAGNOSIS — R200 Anesthesia of skin: Secondary | ICD-10-CM | POA: Diagnosis not present

## 2022-06-18 DIAGNOSIS — M5136 Other intervertebral disc degeneration, lumbar region: Secondary | ICD-10-CM | POA: Diagnosis not present

## 2022-06-18 DIAGNOSIS — M5416 Radiculopathy, lumbar region: Secondary | ICD-10-CM | POA: Diagnosis not present

## 2022-06-18 DIAGNOSIS — R202 Paresthesia of skin: Secondary | ICD-10-CM | POA: Diagnosis not present

## 2022-06-23 DIAGNOSIS — M48062 Spinal stenosis, lumbar region with neurogenic claudication: Secondary | ICD-10-CM | POA: Diagnosis not present

## 2022-06-23 DIAGNOSIS — M5416 Radiculopathy, lumbar region: Secondary | ICD-10-CM | POA: Diagnosis not present

## 2022-06-24 ENCOUNTER — Inpatient Hospital Stay: Payer: HMO

## 2022-06-24 ENCOUNTER — Inpatient Hospital Stay (HOSPITAL_BASED_OUTPATIENT_CLINIC_OR_DEPARTMENT_OTHER): Payer: HMO | Admitting: Oncology

## 2022-06-24 ENCOUNTER — Encounter: Payer: Self-pay | Admitting: Oncology

## 2022-06-24 VITALS — BP 129/62 | HR 64 | Temp 98.2°F | Resp 18 | Wt 186.9 lb

## 2022-06-24 DIAGNOSIS — N1831 Chronic kidney disease, stage 3a: Secondary | ICD-10-CM

## 2022-06-24 DIAGNOSIS — E538 Deficiency of other specified B group vitamins: Secondary | ICD-10-CM | POA: Diagnosis not present

## 2022-06-24 DIAGNOSIS — Z85048 Personal history of other malignant neoplasm of rectum, rectosigmoid junction, and anus: Secondary | ICD-10-CM

## 2022-06-24 DIAGNOSIS — D631 Anemia in chronic kidney disease: Secondary | ICD-10-CM | POA: Diagnosis not present

## 2022-06-24 NOTE — Progress Notes (Signed)
Hematology/Oncology Progress note Telephone:(336) [phone] [phone]     CHIEF COMPLAINTS/REASON FOR VISIT:  Follow up  history of anal cancer, iron deficiency anemia, B12 deficiency   ASSESSMENT & PLAN:   Anemia in stage 3a chronic kidney disease (HCC) Anemia of CKD and iron deficiency anemia S/p IV Venfoer.  Recommend patient to follow up with GI for discussion of repeat colonoscopy.  Labs are reviewed and discussed with patient. Hemoglobin and iron level both improved.  Hold off IV venofer.    B12 deficiency #Vitamin B12 deficiency, intrinsic factor antibody and antiparietal antibody were both negative.  Recommend patient to continue vitamin B12 supplementation.  History of anal cancer #History of anal cancer in 2017, she had a concurrent chemoradiation. Patient is now 7 years after initial diagnosis and treatments. Imaging will be obtained if clinically indicated.   Orders Placed This Encounter  Procedures   CMP ([facility] only)    Standing Status:   Future    Standing Expiration Date:   06/24/2023   CBC with Differential ([facility] Only)    Standing Status:   Future    Standing Expiration Date:   06/24/2023   Iron and TIBC    Standing Status:   Future    Standing Expiration Date:   06/24/2023   Ferritin    Standing Status:   Future    Standing Expiration Date:   06/24/2023   Vitamin B12    Standing Status:   Future    Standing Expiration Date:   06/24/2023   Follow up  B12 injection monthly.  4 months labs prior to MD + Venofer + B12 inj [same labs + cmp]  All questions were answered. The patient knows to call the clinic with any problems, questions or concerns.  Sarah Server, MD, PhD [facility] Hematology Oncology 06/24/2022     HISTORY OF PRESENTING ILLNESS:   Sarah Potts is a  89 y.o.  female with PMH listed below was seen in consultation at the request of  Sarah Pink, MD  for evaluation of history of anal cancer  Patient  reports history of anal cancer in 2017. I obtained previous oncology records and extensive medical record review was performed by me  Patient has noticed a lesion around her anus for about 2 years prior to seeking medical attention.  The lesion was mostly exophytic and about 4 cm in greatest dimension.  The lesion was initially felt to be confined to the perianal skin. 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  Associated heavy inflammatory infiltrate present at the base of the tumor.  Tumor does not extend to inked deep surgical margin.  Patient was seen by radiation oncology, clinical staging was T2N0 M0 and received adjuvant radiation from 7/272017-11/11/2015.  11/22/2015, PET scan showed hypermetabolic activity in the anus consistent with patient's history of anal cancer with metastatic and right iliac chain/inguinal adenopathy.  02/05/2016.  Patient finished an additional radiation treatments with Dr. Aline Potts with attention on just to the right inguinal nodes as well as the primary lesion.  05/30/2016, follow-up PET scan showed resolution of previously noted hypermetabolic inguinal lymph node adenopathy.  Physiological bowel uptake making it difficult to tell whether the residual uptake within the rectum is physiological or residual tumor.  No waning uptake is identified.  Continued follow-up is recommended.  03/17/2018, bone scan showed focal uptake identified involving the right first rib anteriorly at approximately the fifth rib. Patient's pain  is primarily in her spine.  No point tenderness over her ribs. 03/24/2018, MRI cervical thoracic lumbar spine showed no metastatic lesion.  Patient has spondylosis.  Patient reports that she had some image scan done in 2020-not available in current medical records that we obtained..  No images done in 2021.  She recalls that she got chemotherapy.-No records  available at this point.  01/07/2021, MRI abdomen pelvis with and without contrast showed no evidence of local anal cancer recurrence.  No metastatic adenopathy in the pelvis or periaortic retroperitoneum.  Left lower quadrant colostomy without complication.  No hepatic metastasis.  03/13/2021, patient establish care with gastroenterology.  Colonoscopy was recommended for work-up of iron deficiency anemia.  Patient declined. Patient cannot tolerate oral iron supplementation and stopped it.  Previously tolerated Venofer treatments.  #Lung nodule, 10/05/2021, CT chest showed unchanged small nodules of bilateral lower lobes.  No new nodules.  Given the stability, most likely benign incidental.  INTERVAL HISTORY Sarah Potts is a 89 y.o. female who has above history reviewed by me today presents for follow up visit for anal cancer, lung nodule, vitamin B12 deficiency. Patient has been on monthly vitamin B12 injections.  She denies rectal bleeding, rectal pain, or black stool.  Fatigue level is better.   Review of Systems  Constitutional:  Negative for appetite change, chills, fatigue and fever.  HENT:   Negative for hearing loss and voice change.   Eyes:  Negative for eye problems.  Respiratory:  Negative for chest tightness and cough.   Cardiovascular:  Negative for chest pain.  Gastrointestinal:  Negative for abdominal distention, abdominal pain and blood in stool.  Endocrine: Negative for hot flashes.  Genitourinary:  Negative for difficulty urinating and frequency.   Musculoskeletal:  Negative for arthralgias.  Skin:  Negative for itching and rash.  Neurological:  Negative for extremity weakness.  Hematological:  Negative for adenopathy.  Psychiatric/Behavioral:  Negative for confusion.     MEDICAL HISTORY:  Past Medical History:  Diagnosis Date   Anal cancer ([place])    Diabetes mellitus    Hyperlipidemia    Hypertension     SURGICAL HISTORY: Past Surgical History:  Procedure  Laterality Date   BLADDER SURGERY     CARDIAC CATHETERIZATION     COLOSTOMY     CORONARY ANGIOPLASTY WITH STENT PLACEMENT  03/25/2018   Stent to the LCX  2.50 mm x 55m  [employer] [id number]  heart stent     LEFT HEART CATH AND CORONARY ANGIOGRAPHY N/A 12/23/2021   Procedure: LEFT HEART CATH AND CORONARY ANGIOGRAPHY;  Surgeon: AWellington Hampshire MD;  [facility];  Service: Cardiovascular;  Laterality: N/A;   TONSILLECTOMY     uterus surgery     removed    SOCIAL HISTORY: Social History   Socioeconomic History   Marital status: Single    Spouse name: Not on file   Number of children: Not on file   Years of education: Not on file   Highest education level: Not on file  Occupational History   Not on file  Tobacco Use   Smoking status: Never   Smokeless tobacco: Never  Vaping Use   Vaping Use: Never used  Substance and Sexual Activity   Alcohol use: No   Drug use: No   Sexual activity: Not on file  Other Topics Concern   Not on file  Social History Narrative   Not on file   Social  Determinants of Health   Financial Resource Strain: Not on file  Food Insecurity: Not on file  Transportation Needs: Not on file  Physical Activity: Not on file  Stress: Not on file  Social Connections: Not on file  Intimate Partner Violence: Not on file    FAMILY HISTORY: Family History  Problem Relation Age of Onset   Cancer Mother    Breast cancer Mother    Heart disease Father    Heart attack Father    Cancer Sister    Heart disease Sister    Cervical cancer Sister    Heart disease Brother     ALLERGIES:  is allergic to levofloxacin, niacin, and niacin and related.  MEDICATIONS:  Current Outpatient Medications  Medication Sig Dispense Refill   amiodarone (PACERONE) 200 MG tablet Take 1 tablet (200 mg total) by mouth daily. 90 tablet 3   atorvastatin (LIPITOR) 20 MG tablet TAKE 1 TABLET (20 MG TOTAL) BY MOUTH IN THE  MORNING. FOR CHOLESTEROL 90 tablet 1   Cholecalciferol 25 MCG (1000 UT) tablet Take by mouth. Pt takes 3 daily     ezetimibe (ZETIA) 10 MG tablet TAKE 1 TABLET (10 MG TOTAL) BY MOUTH IN THE MORNING. FOR CHOLESTEROL 90 tablet 0   Iron-Vitamin C 65-125 MG TABS Take 1 tablet by mouth daily. 30 tablet 3   losartan (COZAAR) 100 MG tablet Take 1 tablet (100 mg total) by mouth daily. 90 tablet 3   magnesium oxide (MAG-OX) 400 MG tablet Take 400 mg by mouth daily.     metFORMIN (GLUMETZA) 1000 MG (MOD) 24 hr tablet Take 1 tablet (1,000 mg total) by mouth daily with breakfast.     metoprolol succinate (TOPROL-XL) 50 MG 24 hr tablet TAKE 1 TABLET BY MOUTH EVERY DAY 90 tablet 3   mirabegron ER (MYRBETRIQ) 50 MG TB24 tablet Take by mouth.     montelukast (SINGULAIR) 10 MG tablet Take 10 mg by mouth at bedtime.       omega-3 acid ethyl esters (LOVAZA) 1 g capsule Take 2 g by mouth 2 (two) times daily.       omeprazole (PRILOSEC) 40 MG capsule Take 40 mg by mouth daily.       pregabalin (LYRICA) 150 MG capsule Take 150 mg by mouth 2 (two) times daily.     Rivaroxaban (XARELTO) 15 MG TABS tablet Take 1 tablet (15 mg total) by mouth daily. 30 tablet 0   TRULICITY 1.5 0000000 SOPN Inject into the skin once a week.     nitroGLYCERIN (NITROSTAT) 0.4 MG SL tablet Place 1 tablet (0.4 mg total) under the tongue every 5 (five) minutes as needed for chest pain. (Patient not taking: Reported on 06/24/2022) 20 tablet 12   No current facility-administered medications for this visit.     PHYSICAL EXAMINATION: ECOG PERFORMANCE STATUS: 1 - Symptomatic but completely ambulatory Vitals:   06/24/22 1407  BP: 129/62  Pulse: 64  Resp: 18  Temp: 98.2 F (36.8 C)   Filed Weights   06/24/22 1407  Weight: 186 lb 14.4 oz (84.8 kg)     Physical Exam Constitutional:      General: She is not in acute distress. HENT:     Head: Normocephalic and atraumatic.  Eyes:     General: No scleral icterus. Cardiovascular:      Rate and Rhythm: Normal rate and regular rhythm.     Heart sounds: Normal heart sounds.  Pulmonary:     Effort: Pulmonary effort is normal.  No respiratory distress.     Breath sounds: No wheezing.  Abdominal:     General: Bowel sounds are normal. There is no distension.     Palpations: Abdomen is soft.     Comments: Colostomy  Musculoskeletal:        General: No deformity. Normal range of motion.     Cervical back: Normal range of motion and neck supple.  Skin:    General: Skin is warm and dry.     Findings: No erythema or rash.  Neurological:     Mental Status: She is alert and oriented to person, place, and time. Mental status is at baseline.     Cranial Nerves: No cranial nerve deficit.     Coordination: Coordination normal.  Psychiatric:        Mood and Affect: Mood normal.     LABORATORY DATA:  I have reviewed the data as listed    Latest Ref Rng & Units 06/17/2022    3:13 PM 02/10/2022    9:34 AM 12/23/2021    6:15 AM  CBC  WBC 4.0 - 10.5 K/uL 5.8  4.4  3.3   Hemoglobin 12.0 - 15.0 g/dL 12.2  12.5  10.4   Hematocrit 36.0 - 46.0 % 36.6  37.7  31.0   Platelets 150 - 400 K/uL 257  217  185       Latest Ref Rng & Units 06/17/2022    3:13 PM 12/24/2021    7:49 AM 12/21/2021    5:06 AM  CMP  Glucose 70 - 99 mg/dL 117  167  112   BUN 8 - 23 mg/dL '24  20  19   '$ Creatinine 0.44 - 1.00 mg/dL 1.70  1.23  1.22   Sodium 135 - 145 mmol/L 135  138  139   Potassium 3.5 - 5.1 mmol/L 4.6  4.8  4.7   Chloride 98 - 111 mmol/L 105  105  106   CO2 22 - 32 mmol/L '22  25  24   '$ Calcium 8.9 - 10.3 mg/dL 8.3  9.1  8.9   Total Protein 6.5 - 8.1 g/dL 7.3     Total Bilirubin 0.3 - 1.2 mg/dL 0.4     Alkaline Phos 38 - 126 U/L 47     AST 15 - 41 U/L 44     ALT 0 - 44 U/L 46       Iron/TIBC/Ferritin/ %Sat    Component Value Date/Time   IRON 90 06/17/2022 1513   TIBC 339 06/17/2022 1513   FERRITIN 45 06/17/2022 1513   IRONPCTSAT 27 06/17/2022 1513      RADIOGRAPHIC STUDIES: I have  personally reviewed the radiological images as listed and agreed with the findings in the report. No results found.

## 2022-06-24 NOTE — Assessment & Plan Note (Signed)
#  History of anal cancer in 2017, she had a concurrent chemoradiation. Patient is now 7 years after initial diagnosis and treatments. Imaging will be obtained if clinically indicated.

## 2022-06-24 NOTE — Assessment & Plan Note (Signed)
#  Vitamin B12 deficiency, intrinsic factor antibody and antiparietal antibody were both negative.  Recommend patient to continue vitamin B12 supplementation.

## 2022-06-24 NOTE — Assessment & Plan Note (Signed)
Anemia of CKD and iron deficiency anemia S/p IV Venfoer.  Recommend patient to follow up with GI for discussion of repeat colonoscopy.  Labs are reviewed and discussed with patient. Hemoglobin and iron level both improved.  Hold off IV venofer.   

## 2022-07-07 ENCOUNTER — Other Ambulatory Visit: Payer: Self-pay | Admitting: Cardiovascular Disease

## 2022-07-07 DIAGNOSIS — I4891 Unspecified atrial fibrillation: Secondary | ICD-10-CM

## 2022-07-07 NOTE — Progress Notes (Signed)
Cardiology Office Note  Date:  07/08/2022   ID:  Sarah, Potts 1933/02/01, MRN [id number]  PCP:  [place] Pink, MD   Chief Complaint  Patient presents with   Follow-up    3 month f/u.  Echo preformed 04/2022. Medications reviewed verbally.    HPI:  [name] is a very pleasant 89 year old woman with a history of  coronary artery disease diagnosed by cardiac catheterization in 2006,  PCI placed December 2019 in [place] to the left circumflex diabetes,  hypertension,  hyperlipidemia  Anal cancer Atrial fibrillation 2016 in [place] In hospital 9/23, chest pain, cath, medical management recommended who presents for follow-up of her cad, PCI in 2019, atrial fibrillation  Last seen by myself in clinic 6/23  In hospital 9/23, chest pain No clear culprit is identified for elevated troponin. Cardiac cath:   Ost RCA lesion is 30% stenosed.   Prox RCA lesion is 40% stenosed.   Dist RCA lesion is 20% stenosed.   Mid Cx to Dist Cx lesion is 10% stenosed.   Prox Cx lesion is 40% stenosed.   Prox LAD to Mid LAD lesion is 50% stenosed.   Ost LAD to Prox LAD lesion is 40% stenosed.   1.  Mild to moderate nonobstructive coronary artery disease.  Patent left circumflex stent with no significant restenosis.  Moderately to severely calcified coronary arteries. 2.  Left ventricular angiography was not performed due to chronic kidney disease.  EF was mildly reduced by echo.  Mildly elevated left ventricular end-diastolic pressure.   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 Synergy 2.5 x 16 mm to LCX  While living in [place], timing unclear, reports developing tachypalpitations, seen in the hospital Diagnosed with atrial fibrillation, started on Eliquis and metoprolol Reports she is now taking Xarelto   PMH:   has a past medical history of Anal cancer ([place]), Diabetes mellitus, Hyperlipidemia, and Hypertension.  PSH:    Past Surgical History:  Procedure Laterality Date   BLADDER SURGERY     CARDIAC CATHETERIZATION     COLOSTOMY     CORONARY ANGIOPLASTY WITH STENT PLACEMENT  03/25/2018   Stent to the LCX  2.50 mm x 29mm  [employer] Synergy REF [id number]   heart stent     LEFT HEART CATH AND CORONARY ANGIOGRAPHY N/A 12/23/2021   Procedure: LEFT HEART CATH AND CORONARY ANGIOGRAPHY;  Surgeon: Wellington Hampshire, MD;  Location: [facility];  Service: Cardiovascular;  Laterality: N/A;   TONSILLECTOMY     uterus surgery     removed    Current Outpatient Medications  Medication Sig Dispense Refill   Cholecalciferol 25 MCG (1000 UT) tablet Take by mouth. Pt takes 3 daily     magnesium oxide (MAG-OX) 400 MG tablet Take 400 mg by mouth daily.     metFORMIN (  GLUMETZA) 1000 MG (MOD) 24 hr tablet Take 1 tablet (1,000 mg total) by mouth daily with breakfast.     montelukast (SINGULAIR) 10 MG tablet Take 10 mg by mouth at bedtime.       nitroGLYCERIN (NITROSTAT) 0.4 MG SL tablet Place 1 tablet (0.4 mg total) under the tongue every 5 (five) minutes as needed for chest pain. 20 tablet 12   omega-3 acid ethyl esters (LOVAZA) 1 g capsule Take 2 g by mouth 2 (two) times daily.       omeprazole (PRILOSEC) 40 MG capsule Take 40 mg by mouth daily.       pregabalin (LYRICA) 150 MG capsule Take 150 mg by mouth 2 (two) times daily.      Rivaroxaban (XARELTO) 15 MG TABS tablet TAKE 1 TABLET (15 MG TOTAL) BY MOUTH DAILY. 90 tablet 1   TRULICITY 1.5 0000000 SOPN Inject into the skin once a week.     amiodarone (PACERONE) 200 MG tablet Take 1 tablet (200 mg total) by mouth daily. 90 tablet 3   atorvastatin (LIPITOR) 20 MG tablet Take 1 tablet (20 mg total) by mouth in the morning. For cholesterol 90 tablet 1   ezetimibe (ZETIA) 10 MG tablet Take 1 tablet (10 mg total) by mouth in the morning. For cholesterol 90 tablet 0   Iron-Vitamin C 65-125 MG TABS Take 1 tablet by mouth daily. (Patient not taking: Reported on 07/08/2022) 30 tablet 3   losartan (COZAAR) 100 MG tablet Take 1 tablet (100 mg total) by mouth daily. 90 tablet 3   metoprolol succinate (TOPROL-XL) 50 MG 24 hr tablet Take 1 tablet (50 mg total) by mouth daily. 90 tablet 3   mirabegron ER (MYRBETRIQ) 50 MG TB24 tablet Take by mouth. (Patient not taking: Reported on 07/08/2022)     No current facility-administered medications for this visit.    Allergies:   Levofloxacin, Niacin, and Niacin and related   Social History:  The patient  reports that she has never smoked. She has never used smokeless tobacco. She reports that she does not drink alcohol and does not use drugs.   Family History:   family history includes Breast cancer in her mother; Cancer in her mother and sister; Cervical cancer in her sister; Heart attack in her father; Heart disease in her brother, father, and sister.    Review of Systems: Review of Systems  Constitutional: Negative.   HENT: Negative.    Respiratory: Negative.    Cardiovascular: Negative.   Gastrointestinal: Negative.   Musculoskeletal: Negative.   Neurological: Negative.   Psychiatric/Behavioral: Negative.    All other systems reviewed and are negative.   PHYSICAL EXAM: VS:  BP (!) 122/58 (BP Location: Left Arm, Patient Position: Sitting, Cuff Size: Normal)   Pulse 61   Ht 5' 2.5" (1.588 m)   Wt 185 lb 6.4 oz (84.1 kg)    SpO2 95%   BMI 33.37 kg/m  , BMI Body mass index is 33.37 kg/m. Constitutional:  oriented to person, place, and time. No distress.  HENT:  Head: Grossly normal Eyes:  no discharge. No scleral icterus.  Neck: No JVD, no carotid bruits  Cardiovascular: Regular rate and rhythm, no murmurs appreciated Pulmonary/Chest: Clear to auscultation bilaterally, no wheezes or rails Abdominal: Soft.  no distension.  no tenderness.  Musculoskeletal: Normal range of motion Neurological:  normal muscle tone. Coordination normal. No atrophy Skin: Skin warm and dry Psychiatric: normal affect, pleasant  Recent Labs: 11/30/2021: TSH 1.639 12/20/2021: B Natriuretic  Peptide 162.4 06/17/2022: ALT 46; BUN 24; Creatinine, Ser 1.70; Hemoglobin 12.2; Platelets 257; Potassium 4.6; Sodium 135    Lipid Panel Lab Results  Component Value Date   CHOL 123 12/21/2021   HDL 42 12/21/2021   LDLCALC 46 12/21/2021   TRIG 177 (H) 12/21/2021    Wt Readings from Last 3 Encounters:  07/08/22 185 lb 6.4 oz (84.1 kg)  06/24/22 186 lb 14.4 oz (84.8 kg)  04/08/22 179 lb 6.4 oz (81.4 kg)     ASSESSMENT AND PLAN:  Problem List Items Addressed This Visit       Cardiology Problems   PAF (paroxysmal atrial fibrillation) (HCC)   Relevant Medications   amiodarone (PACERONE) 200 MG tablet   atorvastatin (LIPITOR) 20 MG tablet   ezetimibe (ZETIA) 10 MG tablet   losartan (COZAAR) 100 MG tablet   metoprolol succinate (TOPROL-XL) 50 MG 24 hr tablet   CAD (coronary artery disease) - Primary   Relevant Medications   amiodarone (PACERONE) 200 MG tablet   atorvastatin (LIPITOR) 20 MG tablet   ezetimibe (ZETIA) 10 MG tablet   losartan (COZAAR) 100 MG tablet   metoprolol succinate (TOPROL-XL) 50 MG 24 hr tablet   Other Relevant Orders   EKG 12-Lead   HTN (hypertension)   Relevant Medications   amiodarone (PACERONE) 200 MG tablet   atorvastatin (LIPITOR) 20 MG tablet   ezetimibe (ZETIA) 10 MG tablet   losartan (COZAAR) 100  MG tablet   metoprolol succinate (TOPROL-XL) 50 MG 24 hr tablet   Other Relevant Orders   EKG 12-Lead   Hyperlipidemia   Relevant Medications   amiodarone (PACERONE) 200 MG tablet   atorvastatin (LIPITOR) 20 MG tablet   ezetimibe (ZETIA) 10 MG tablet   losartan (COZAAR) 100 MG tablet   metoprolol succinate (TOPROL-XL) 50 MG 24 hr tablet   Other Relevant Orders   EKG 12-Lead     Other   Stage 3a chronic kidney disease (HCC)   Diabetes mellitus (HCC)   Relevant Medications   atorvastatin (LIPITOR) 20 MG tablet   losartan (COZAAR) 100 MG tablet   Other Visit Diagnoses     Nonischemic cardiomyopathy (HCC)       Relevant Medications   amiodarone (PACERONE) 200 MG tablet   atorvastatin (LIPITOR) 20 MG tablet   ezetimibe (ZETIA) 10 MG tablet   losartan (COZAAR) 100 MG tablet   metoprolol succinate (TOPROL-XL) 50 MG 24 hr tablet   Other Relevant Orders   EKG 12-Lead     Coronary disease with stable angina Stent placed 2019 left circumflex [employer] Synergy stent Recent catheterization for chest pain showing nonobstructive disease Cholesterol at goal, blood pressure stable  Paroxysmal atrial fibrillation Maintaining normal sinus rhythm Xarelto 15 mg daily in place of aspirin  Essential hypertension Recommend she continue metoprolol succinate losartan  Blood pressure is well controlled on today's visit. No changes made to the medications.  Hyperlipidemia Recommend she continue on Lipitor Zetia in the morning Cholesterol at goal    Total encounter time more than 30 minutes  Greater than 50% was spent in counseling and coordination of care with the patient    Signed, Esmond Plants, M.D., Ph.D. [place], [place]

## 2022-07-07 NOTE — Telephone Encounter (Signed)
Refill request for Xarelto 

## 2022-07-07 NOTE — Telephone Encounter (Signed)
Prescription refill request for Xarelto received.  Indication: a fib Last office visit: 04/08/22 Weight: 84 kg Age: 89 Scr: 1.7 06/17/22 epic CrCl: 31 mL/min

## 2022-07-08 ENCOUNTER — Encounter: Payer: Self-pay | Admitting: Cardiovascular Disease

## 2022-07-08 ENCOUNTER — Ambulatory Visit: Payer: HMO | Attending: Cardiovascular Disease | Admitting: Cardiovascular Disease

## 2022-07-08 VITALS — BP 122/58 | HR 61 | Ht 62.5 in | Wt 185.4 lb

## 2022-07-08 DIAGNOSIS — E1159 Type 2 diabetes mellitus with other circulatory complications: Secondary | ICD-10-CM

## 2022-07-08 DIAGNOSIS — E538 Deficiency of other specified B group vitamins: Secondary | ICD-10-CM | POA: Diagnosis not present

## 2022-07-08 DIAGNOSIS — E782 Mixed hyperlipidemia: Secondary | ICD-10-CM

## 2022-07-08 DIAGNOSIS — I480 Paroxysmal atrial fibrillation: Secondary | ICD-10-CM | POA: Diagnosis not present

## 2022-07-08 DIAGNOSIS — I25118 Atherosclerotic heart disease of native coronary artery with other forms of angina pectoris: Secondary | ICD-10-CM

## 2022-07-08 DIAGNOSIS — I428 Other cardiomyopathies: Secondary | ICD-10-CM

## 2022-07-08 DIAGNOSIS — N1831 Chronic kidney disease, stage 3a: Secondary | ICD-10-CM

## 2022-07-08 DIAGNOSIS — I10 Essential (primary) hypertension: Secondary | ICD-10-CM | POA: Diagnosis not present

## 2022-07-08 MED ORDER — ATORVASTATIN CALCIUM 20 MG PO TABS: 20.0000 mg | ORAL_TABLET | Freq: Every morning | ORAL | 1 refills | Status: DC

## 2022-07-08 MED ORDER — LOSARTAN POTASSIUM 100 MG PO TABS: 100.0000 mg | ORAL_TABLET | Freq: Every day | ORAL | 3 refills | Status: DC

## 2022-07-08 MED ORDER — EZETIMIBE 10 MG PO TABS: 10.0000 mg | ORAL_TABLET | Freq: Every morning | ORAL | 0 refills | Status: DC

## 2022-07-08 MED ORDER — METOPROLOL SUCCINATE ER 50 MG PO TB24: 50.0000 mg | ORAL_TABLET | Freq: Every day | ORAL | 3 refills | Status: DC

## 2022-07-08 MED ORDER — AMIODARONE HCL 200 MG PO TABS: 200.0000 mg | ORAL_TABLET | Freq: Every day | ORAL | 3 refills | Status: DC

## 2022-07-08 NOTE — Patient Instructions (Addendum)
Medication Instructions:  ?No changes ? ?If you need a refill on your cardiac medications before your next appointment, please call your pharmacy.  ? ?Lab work: ?No new labs needed ? ?Testing/Procedures: ?No new testing needed ? ?Follow-Up: ?At [facility], you and your health needs are our priority.  As part of our continuing mission to provide you with exceptional heart care, we have created designated Provider Care Teams.  These Care Teams include your primary Cardiologist (physician) and Advanced Practice Providers (APPs -  Physician Assistants and Nurse Practitioners) who all work together to provide you with the care you need, when you need it. ? ?You will need a follow up appointment in 6 months, app ok ? ?Providers on your designated Care Team:   ?Christopher Berge, NP ?Ryan Dunn, PA-C ?Cadence Furth, PA-C ? ?COVID-19 Vaccine Information can be found at: [email or web address] For questions related to vaccine distribution or appointments, please email [email or web address] or call [phone].  ? ?

## 2022-07-18 DIAGNOSIS — Z933 Colostomy status: Secondary | ICD-10-CM | POA: Diagnosis not present

## 2022-07-30 ENCOUNTER — Encounter: Payer: Self-pay | Admitting: Emergency Medicine

## 2022-07-30 ENCOUNTER — Other Ambulatory Visit: Payer: Self-pay

## 2022-07-30 ENCOUNTER — Emergency Department
Admission: EM | Admit: 2022-07-30 | Discharge: 2022-07-30 | Disposition: A | Discharge status: Home / Self Care | Payer: HMO | Attending: Emergency Medicine | Admitting: Emergency Medicine

## 2022-07-30 ENCOUNTER — Emergency Department: Payer: HMO

## 2022-07-30 DIAGNOSIS — S0990XA Unspecified injury of head, initial encounter: Secondary | ICD-10-CM | POA: Diagnosis not present

## 2022-07-30 DIAGNOSIS — M5416 Radiculopathy, lumbar region: Secondary | ICD-10-CM | POA: Diagnosis not present

## 2022-07-30 DIAGNOSIS — Z7901 Long term (current) use of anticoagulants: Secondary | ICD-10-CM | POA: Insufficient documentation

## 2022-07-30 DIAGNOSIS — I2510 Atherosclerotic heart disease of native coronary artery without angina pectoris: Secondary | ICD-10-CM | POA: Diagnosis not present

## 2022-07-30 DIAGNOSIS — Y92002 Bathroom of unspecified non-institutional (private) residence single-family (private) house as the place of occurrence of the external cause: Secondary | ICD-10-CM | POA: Diagnosis not present

## 2022-07-30 DIAGNOSIS — E1122 Type 2 diabetes mellitus with diabetic chronic kidney disease: Secondary | ICD-10-CM | POA: Diagnosis not present

## 2022-07-30 DIAGNOSIS — W182XXA Fall in (into) shower or empty bathtub, initial encounter: Secondary | ICD-10-CM | POA: Diagnosis not present

## 2022-07-30 DIAGNOSIS — M48061 Spinal stenosis, lumbar region without neurogenic claudication: Secondary | ICD-10-CM | POA: Diagnosis not present

## 2022-07-30 DIAGNOSIS — S39012A Strain of muscle, fascia and tendon of lower back, initial encounter: Secondary | ICD-10-CM | POA: Insufficient documentation

## 2022-07-30 DIAGNOSIS — I129 Hypertensive chronic kidney disease with stage 1 through stage 4 chronic kidney disease, or unspecified chronic kidney disease: Secondary | ICD-10-CM | POA: Insufficient documentation

## 2022-07-30 DIAGNOSIS — Y92009 Unspecified place in unspecified non-institutional (private) residence as the place of occurrence of the external cause: Secondary | ICD-10-CM

## 2022-07-30 DIAGNOSIS — N189 Chronic kidney disease, unspecified: Secondary | ICD-10-CM | POA: Insufficient documentation

## 2022-07-30 DIAGNOSIS — M5136 Other intervertebral disc degeneration, lumbar region: Secondary | ICD-10-CM | POA: Diagnosis not present

## 2022-07-30 DIAGNOSIS — M47812 Spondylosis without myelopathy or radiculopathy, cervical region: Secondary | ICD-10-CM | POA: Diagnosis not present

## 2022-07-30 DIAGNOSIS — S3992XA Unspecified injury of lower back, initial encounter: Secondary | ICD-10-CM | POA: Diagnosis present

## 2022-07-30 NOTE — Discharge Instructions (Signed)
Your exam and CT scans are normal and reassuring following your fall.  No evidence of a serious head injury or fracture to your neck or back.  You may experience some sore muscles over the next few days.  Take your home medications as prescribed.  Follow-up with primary provider for ongoing symptoms peer return to ED if needed.

## 2022-07-30 NOTE — ED Provider Notes (Signed)
[facility] Emergency Department Provider Note     Event Date/Time   First MD Initiated Contact with Patient 07/30/22 1403     (approximate)   History   Fall   HPI  Sarah Potts is a 89 y.o. female with a history of CKD, HTN, CAD, DM, and a. Fib on Xarelto. She reports a mechanical fall on Sunday at home. She describes losing her balance. She describes landing on her buttock and then tipping back, hitting the back of the head. She denies LOC, dizziness, weakness, or bleeding. She does endorse pain to the neck and acute on chronic LBP. No bladder/bowel incontinence, distal paresthesia, or foot drop.  She was advised to report to the ED for evaluation by her primary provider given the fact that she is on blood thinners.  Physical Exam   Triage Vital Signs: ED Triage Vitals  Enc Vitals Group     BP 07/30/22 1411 (!) 142/61     Pulse Rate 07/30/22 1411 (!) 54     Resp 07/30/22 1411 16     Temp 07/30/22 1411 97.9 F (36.6 C)     Temp Source 07/30/22 1411 Oral     SpO2 07/30/22 1411 97 %     Weight 07/30/22 1412 183 lb (83 kg)     Height 07/30/22 1412 5\' 2"  (1.575 m)     Head Circumference --      Peak Flow --      Pain Score 07/30/22 1412 5     Pain Loc --      Pain Edu? --      Excl. in GC? --     Most recent vital signs: Vitals:   07/30/22 1411  BP: (!) 142/61  Pulse: (!) 54  Resp: 16  Temp: 97.9 F (36.6 C)  SpO2: 97%    General Awake, no distress.  NAD HEENT NCAT.  Posterior scalp with a small area of resolving ecchymosis noted to the posterior occiput.  PERRL. EOMI. No rhinorrhea. Mucous membranes are moist.  CV:  Good peripheral perfusion.  RRR RESP:  Normal effort.  CTA ABD:  No distention.  MSK:  Normal spinal alignment without midline tenderness, spasm, deformity, or step-off.  Patient localizes pain to the right paracervical musculature.  Normal active range of motion of the upper and lower extremities. NEURO: Cranial nerves  II to XII grossly intact.  ED Results / Procedures / Treatments   Labs (all labs ordered are listed, but only abnormal results are displayed) Labs Reviewed - No data to display  EKG   RADIOLOGY  I personally viewed and evaluated these images as part of my medical decision making, as well as reviewing the written report by the radiologist.  ED Provider Interpretation: no acute findings  CT Lumbar Spine Wo Contrast  Result Date: 07/30/2022 CLINICAL DATA:  Back trauma.  Fall. EXAM: CT LUMBAR SPINE WITHOUT CONTRAST TECHNIQUE: Multidetector CT imaging of the lumbar spine was performed without intravenous contrast administration. Multiplanar CT image reconstructions were also generated. RADIATION DOSE REDUCTION: This exam was performed according to the departmental dose-optimization program which includes automated exposure control, adjustment of the mA and/or kV according to patient size and/or use of iterative reconstruction technique. COMPARISON:  Lumbar MRI 06/26/2021 FINDINGS: Segmentation: There are 5 lumbar type vertebral bodies. Alignment: Stable and near anatomic. Vertebrae: Stable chronic superior endplate compression deformity at L3. No evidence of acute fracture, traumatic subluxation or pars defect sacroiliac degenerative changes are present  bilaterally. Paraspinal and other soft tissues: No acute paraspinal findings. Aortoiliac atherosclerosis. Previous cholecystectomy, right hemicolectomy and sigmoid colostomy. Disc levels: Disc bulging and endplate osteophytes within the visualized lower thoracic spine without evidence of significant spinal stenosis. L1-2: Loss of disc height with disc bulging and vacuum phenomenon. No significant spinal stenosis. L2-3: Chronic spondylosis with loss of disc height, annular disc bulging and endplate osteophytes asymmetric to the left. Moderate facet and ligamentous hypertrophy. Stable mild spinal stenosis with asymmetric narrowing of the left lateral  recess and left foramen. L3-4: Moderate multifactorial spinal stenosis secondary to annular disc bulging, loss of disc height, facet and ligamentous hypertrophy. Similar to previous MRI. L4-5: Stable mild loss of disc height with annular disc bulging and endplate osteophytes asymmetric to the right. Moderate facet and ligamentous hypertrophy. Stable mild to moderate multifactorial spinal stenosis with asymmetric lateral recess and foraminal narrowing on the right. L5-S1: Chronic loss of disc height with annular disc bulging and endplate osteophytes asymmetric to the right. Mild facet and ligamentous hypertrophy. The spinal canal and lateral recesses are patent. There is chronic moderate foraminal narrowing, right greater than left. IMPRESSION: 1. No evidence of acute lumbar spine fracture, traumatic subluxation or static signs of instability. 2. Stable chronic superior endplate compression deformity at L3. 3. Multilevel spondylosis as described, similar to previous MRI. There is moderate multifactorial spinal stenosis at L3-4 and mild to moderate spinal stenosis at L4-5. Chronic foraminal narrowing as described. 4.  Aortic Atherosclerosis (ICD10-I70.0). Electronically Signed   By: Carey Bullocks M.D.   On: 07/30/2022 15:05   CT HEAD WO CONTRAST ( )  Result Date: 07/30/2022 CLINICAL DATA:  Head and neck trauma.  Fall. EXAM: CT HEAD WITHOUT CONTRAST CT CERVICAL SPINE WITHOUT CONTRAST TECHNIQUE: Multidetector CT imaging of the head and cervical spine was performed following the standard protocol without intravenous contrast. Multiplanar CT image reconstructions of the cervical spine were also generated. RADIATION DOSE REDUCTION: This exam was performed according to the departmental dose-optimization program which includes automated exposure control, adjustment of the mA and/or kV according to patient size and/or use of iterative reconstruction technique. COMPARISON:  Head CT 11/30/2021. FINDINGS: CT HEAD  FINDINGS Brain: No acute intracranial hemorrhage. Gray-white differentiation is preserved. No hydrocephalus or extra-axial collection. No mass effect or midline shift. Vascular: No hyperdense vessel or unexpected calcification. Skull: No calvarial fracture or suspicious bone lesion. Skull base is unremarkable. Sinuses/Orbits: Unremarkable. Other: None. CT CERVICAL SPINE FINDINGS Alignment: Normal. Skull base and vertebrae: No acute fracture. Normal craniocervical junction. No suspicious bone lesions. Soft tissues and spinal canal: No prevertebral fluid or swelling. No visible canal hematoma. Disc levels: Multilevel cervical spondylosis, worst at C5-6, where there is moderate spinal canal stenosis. Upper chest: Unremarkable. Other: Atherosclerotic calcifications of the carotid bulbs. IMPRESSION: CT HEAD: No evidence of acute intracranial injury. CT CERVICAL SPINE: 1. No acute fracture or traumatic listhesis. 2. Multilevel cervical spondylosis, worst at C5-6 where there is moderate spinal canal stenosis. Electronically Signed   By: Orvan Falconer M.D.   On: 07/30/2022 14:59   CT Cervical Spine Wo Contrast  Result Date: 07/30/2022 CLINICAL DATA:  Head and neck trauma.  Fall. EXAM: CT HEAD WITHOUT CONTRAST CT CERVICAL SPINE WITHOUT CONTRAST TECHNIQUE: Multidetector CT imaging of the head and cervical spine was performed following the standard protocol without intravenous contrast. Multiplanar CT image reconstructions of the cervical spine were also generated. RADIATION DOSE REDUCTION: This exam was performed according to the departmental dose-optimization program which includes automated exposure control, adjustment  of the mA and/or kV according to patient size and/or use of iterative reconstruction technique. COMPARISON:  Head CT 11/30/2021. FINDINGS: CT HEAD FINDINGS Brain: No acute intracranial hemorrhage. Gray-white differentiation is preserved. No hydrocephalus or extra-axial collection. No mass effect or  midline shift. Vascular: No hyperdense vessel or unexpected calcification. Skull: No calvarial fracture or suspicious bone lesion. Skull base is unremarkable. Sinuses/Orbits: Unremarkable. Other: None. CT CERVICAL SPINE FINDINGS Alignment: Normal. Skull base and vertebrae: No acute fracture. Normal craniocervical junction. No suspicious bone lesions. Soft tissues and spinal canal: No prevertebral fluid or swelling. No visible canal hematoma. Disc levels: Multilevel cervical spondylosis, worst at C5-6, where there is moderate spinal canal stenosis. Upper chest: Unremarkable. Other: Atherosclerotic calcifications of the carotid bulbs. IMPRESSION: CT HEAD: No evidence of acute intracranial injury. CT CERVICAL SPINE: 1. No acute fracture or traumatic listhesis. 2. Multilevel cervical spondylosis, worst at C5-6 where there is moderate spinal canal stenosis. Electronically Signed   By: Orvan Falconer M.D.   On: 07/30/2022 14:59     PROCEDURES:  Critical Care performed: No  Procedures   MEDICATIONS ORDERED IN ED: Medications - No data to display   IMPRESSION / MDM / ASSESSMENT AND PLAN / ED COURSE  I reviewed the triage vital signs and the nursing notes.                              Differential diagnosis includes, but is not limited to, SDH, SAH, minor head contusion, scalp laceration, cervical strain, cervical fracture, lumbar sprain, lumbar fracture, myalgias  Patient's presentation is most consistent with acute complicated illness / injury requiring diagnostic workup.  Patient's diagnosis is consistent with mechanical fall resulted in minor head contusion.  No radiologic evidence of any acute intracranial process.  CTs of the cervical and lumbar spine were also negative for any acute findings, based on my interpretation of images.  With reassuring exam and workup overall without any signs of acute injury related to her mechanical fall.  Patient will be discharged home with instructions to take  OTC Tylenol or Motrin as needed. Patient is to follow up with primary provider as needed or otherwise directed. Patient is given ED precautions to return to the ED for any worsening or new symptoms.  FINAL CLINICAL IMPRESSION(S) / ED DIAGNOSES   Final diagnoses:  Fall in home, initial encounter  Minor head injury, initial encounter  Lumbar strain, initial encounter     Rx / DC Orders   ED Discharge Orders     None        Note:  This document was prepared using Dragon voice recognition software and may include unintentional dictation errors.    Lissa Hoard, PA-C 07/30/22 Mallie Snooks    Jene Every, MD 07/31/22 [phone]

## 2022-07-30 NOTE — ED Triage Notes (Signed)
Pt reports she had a fall getting out of the bathtub on 07/27/22, hit her head and back on the bathtub, denies LOC, takes Xarelto, was seen by PCP today and advised to come to ED to rule out head bleed, denies pain and symptoms other than chronic back pain

## 2022-08-19 DIAGNOSIS — Z933 Colostomy status: Secondary | ICD-10-CM | POA: Diagnosis not present

## 2022-08-28 DIAGNOSIS — D509 Iron deficiency anemia, unspecified: Secondary | ICD-10-CM | POA: Diagnosis not present

## 2022-08-28 DIAGNOSIS — R1110 Vomiting, unspecified: Secondary | ICD-10-CM | POA: Diagnosis not present

## 2022-08-28 DIAGNOSIS — I5032 Chronic diastolic (congestive) heart failure: Secondary | ICD-10-CM | POA: Diagnosis not present

## 2022-08-28 DIAGNOSIS — I482 Chronic atrial fibrillation, unspecified: Secondary | ICD-10-CM | POA: Diagnosis not present

## 2022-08-28 DIAGNOSIS — E1140 Type 2 diabetes mellitus with diabetic neuropathy, unspecified: Secondary | ICD-10-CM | POA: Diagnosis not present

## 2022-08-28 DIAGNOSIS — N1832 Chronic kidney disease, stage 3b: Secondary | ICD-10-CM | POA: Diagnosis not present

## 2022-08-28 DIAGNOSIS — E538 Deficiency of other specified B group vitamins: Secondary | ICD-10-CM | POA: Diagnosis not present

## 2022-08-28 DIAGNOSIS — I10 Essential (primary) hypertension: Secondary | ICD-10-CM | POA: Diagnosis not present

## 2022-08-28 DIAGNOSIS — I25119 Atherosclerotic heart disease of native coronary artery with unspecified angina pectoris: Secondary | ICD-10-CM | POA: Diagnosis not present

## 2022-08-28 DIAGNOSIS — Z433 Encounter for attention to colostomy: Secondary | ICD-10-CM | POA: Diagnosis not present

## 2022-08-28 DIAGNOSIS — Z0000 Encounter for general adult medical examination without abnormal findings: Secondary | ICD-10-CM | POA: Diagnosis not present

## 2022-08-28 DIAGNOSIS — Z9049 Acquired absence of other specified parts of digestive tract: Secondary | ICD-10-CM | POA: Diagnosis not present

## 2022-08-28 DIAGNOSIS — E785 Hyperlipidemia, unspecified: Secondary | ICD-10-CM | POA: Diagnosis not present

## 2022-09-18 ENCOUNTER — Encounter: Payer: Self-pay | Admitting: Oncology

## 2022-09-29 ENCOUNTER — Encounter: Payer: Self-pay | Admitting: Podiatry

## 2022-09-29 ENCOUNTER — Ambulatory Visit: Payer: Medicare HMO | Admitting: Podiatry

## 2022-09-29 DIAGNOSIS — M722 Plantar fascial fibromatosis: Secondary | ICD-10-CM

## 2022-09-29 DIAGNOSIS — M79676 Pain in unspecified toe(s): Secondary | ICD-10-CM

## 2022-09-29 DIAGNOSIS — Z86010 Personal history of colonic polyps: Secondary | ICD-10-CM | POA: Insufficient documentation

## 2022-09-29 DIAGNOSIS — Z860101 Personal history of adenomatous and serrated colon polyps: Secondary | ICD-10-CM | POA: Insufficient documentation

## 2022-09-29 DIAGNOSIS — B351 Tinea unguium: Secondary | ICD-10-CM | POA: Diagnosis not present

## 2022-09-29 MED ORDER — TRIAMCINOLONE ACETONIDE 40 MG/ML IJ SUSP
20.0000 mg | Freq: Once | INTRAMUSCULAR | Status: AC
Start: 2022-09-29 — End: 2022-09-29
  Administered 2022-09-29: 20 mg

## 2022-09-29 NOTE — Progress Notes (Signed)
Subjective:  Patient ID: Sarah Potts, female    DOB: [birth year],  MRN: [id number] HPI Chief Complaint  Patient presents with   Debridement    Trim toenails-diabetic - 7.2   New Patient (Initial Visit)    89 y.o. female presents with the above complaint.   ROS: Denies fever chills nausea vomit muscle aches pains calf pain back pain chest pain shortness of breath  Past Medical History:  Diagnosis Date   Anal cancer (HCC)    Diabetes mellitus    Hyperlipidemia    Hypertension    Past Surgical History:  Procedure Laterality Date   BLADDER SURGERY     CARDIAC CATHETERIZATION     COLOSTOMY     CORONARY ANGIOPLASTY WITH STENT PLACEMENT  03/25/2018   Stent to the LCX  2.50 mm x 16mm  [employer] REF [id number]   heart stent     LEFT HEART CATH AND CORONARY ANGIOGRAPHY N/A 12/23/2021   Procedure: LEFT HEART CATH AND CORONARY ANGIOGRAPHY;  Surgeon: Iran Ouch, MD;  Location: [facility] INVASIVE CV LAB;  Service: Cardiovascular;  Laterality: N/A;   TONSILLECTOMY     uterus surgery     removed    Current Outpatient Medications:    nystatin (MYCOSTATIN/NYSTOP) powder, Apply topically 2 (two) times daily., Disp: , Rfl:    sertraline (ZOLOFT) 50 MG tablet, Take 50 mg by mouth daily., Disp: , Rfl:    amiodarone (PACERONE) 200 MG tablet, Take 1 tablet (200 mg total) by mouth daily., Disp: 90 tablet, Rfl: 3   atorvastatin (LIPITOR) 20 MG tablet, Take 1 tablet (20 mg total) by mouth in the morning. For cholesterol, Disp: 90 tablet, Rfl: 1   Cholecalciferol 25 MCG (1000 UT) tablet, Take by mouth. Pt takes 3 daily, Disp: , Rfl:    ezetimibe (ZETIA) 10 MG tablet, Take 1 tablet (10 mg total) by mouth in the morning. For cholesterol, Disp: 90 tablet, Rfl: 0   Iron-Vitamin C 65-125 MG TABS, Take 1 tablet by mouth daily. (Patient not taking: Reported on 07/08/2022), Disp: 30 tablet, Rfl: 3   losartan (COZAAR) 100 MG tablet, Take 1 tablet (100 mg total) by mouth  daily., Disp: 90 tablet, Rfl: 3   magnesium oxide (MAG-OX) 400 MG tablet, Take 400 mg by mouth daily., Disp: , Rfl:    metFORMIN (GLUMETZA) 1000 MG (MOD) 24 hr tablet, Take 1 tablet (1,000 mg total) by mouth daily with breakfast., Disp: , Rfl:    metoprolol succinate (TOPROL-XL) 50 MG 24 hr tablet, Take 1 tablet (50 mg total) by mouth daily., Disp: 90 tablet, Rfl: 3   mirabegron ER (MYRBETRIQ) 50 MG TB24 tablet, Take by mouth. (Patient not taking: Reported on 07/08/2022), Disp: , Rfl:    montelukast (SINGULAIR) 10 MG tablet, Take 10 mg by mouth at bedtime.  , Disp: , Rfl:    nitroGLYCERIN (NITROSTAT) 0.4 MG SL tablet, Place 1 tablet (0.4 mg total) under the tongue every 5 (five) minutes as needed for chest pain., Disp: 20 tablet, Rfl: 12   omega-3 acid ethyl esters (LOVAZA) 1 g capsule, Take 2 g by mouth 2 (two) times daily.  , Disp: , Rfl:    omeprazole (PRILOSEC) 40 MG capsule, Take 40 mg by mouth daily.  , Disp: , Rfl:    pregabalin (LYRICA) 150 MG capsule, Take 150 mg by mouth 2 (two) times daily., Disp: , Rfl:    Rivaroxaban (XARELTO) 15 MG TABS tablet, TAKE 1 TABLET (15 MG TOTAL)  BY MOUTH DAILY., Disp: 90 tablet, Rfl: 1   TRULICITY 1.5 MG/0.5ML SOPN, Inject into the skin once a week., Disp: , Rfl:   Allergies  Allergen Reactions   Levofloxacin Nausea Only    Patient reports that she thinks that she could take this medication.    Niacin Other (See Comments)    Hot, Flushed   Niacin And Related Other (See Comments)    Pt states she get real red and hot.   Review of Systems Objective:  There were no vitals filed for this visit.  General: Well developed, nourished, in no acute distress, alert and oriented x3   Dermatological: Skin is warm, dry and supple bilateral. Nails x 10 are long thick yellow dystrophic onychomycotic and painful.; remaining integument appears unremarkable at this time. There are no open sores, no preulcerative lesions, no rash or signs of infection  present.  Vascular: Dorsalis Pedis artery and Posterior Tibial artery pedal pulses are 2/4 bilateral with immedate capillary fill time. Pedal hair growth present. No varicosities and no lower extremity edema present bilateral.   Neruologic: Grossly intact via light touch bilateral. Vibratory intact via tuning fork bilateral. Protective threshold with Semmes Wienstein monofilament intact to all pedal sites bilateral. Patellar and Achilles deep tendon reflexes 2+ bilateral. No Babinski or clonus noted bilateral.   Musculoskeletal: No gross boney pedal deformities bilateral. No pain, crepitus, or limitation noted with foot and ankle range of motion bilateral. Muscular strength 5/5 in all groups tested bilateral.  She has pain on palpation medial calcaneal tubercle her left heel and her Achilles tendon.  Gait: Unassisted, Nonantalgic.    Radiographs:  None taken  Assessment & Plan:   Assessment: Planter fasciitis left.  Pain in limb secondary to onychomycosis.  Plan: Injected the left heel today 20 mg Kenalog 5 mg Marcaine point maximal tenderness.  Discussed appropriate shoe gear.  I debrided toenails 1 through 5 bilaterally.     Abdel Effinger T. [place], [place]

## 2022-12-10 ENCOUNTER — Encounter: Payer: Self-pay | Admitting: Oncology

## 2022-12-27 ENCOUNTER — Other Ambulatory Visit: Payer: Self-pay | Admitting: Cardiovascular Disease

## 2022-12-30 ENCOUNTER — Other Ambulatory Visit: Payer: Self-pay

## 2022-12-30 ENCOUNTER — Emergency Department
Admission: EM | Admit: 2022-12-30 | Discharge: 2022-12-30 | Disposition: A | Discharge status: Home / Self Care | Payer: Medicare HMO | Attending: Emergency Medicine | Admitting: Emergency Medicine

## 2022-12-30 ENCOUNTER — Emergency Department: Payer: Medicare HMO

## 2022-12-30 DIAGNOSIS — S20211A Contusion of right front wall of thorax, initial encounter: Secondary | ICD-10-CM | POA: Diagnosis not present

## 2022-12-30 DIAGNOSIS — S299XXA Unspecified injury of thorax, initial encounter: Secondary | ICD-10-CM | POA: Diagnosis present

## 2022-12-30 DIAGNOSIS — Y92009 Unspecified place in unspecified non-institutional (private) residence as the place of occurrence of the external cause: Secondary | ICD-10-CM | POA: Diagnosis not present

## 2022-12-30 DIAGNOSIS — E119 Type 2 diabetes mellitus without complications: Secondary | ICD-10-CM | POA: Diagnosis not present

## 2022-12-30 DIAGNOSIS — S0990XA Unspecified injury of head, initial encounter: Secondary | ICD-10-CM | POA: Diagnosis not present

## 2022-12-30 DIAGNOSIS — Z7901 Long term (current) use of anticoagulants: Secondary | ICD-10-CM | POA: Diagnosis not present

## 2022-12-30 DIAGNOSIS — I10 Essential (primary) hypertension: Secondary | ICD-10-CM | POA: Insufficient documentation

## 2022-12-30 DIAGNOSIS — W19XXXA Unspecified fall, initial encounter: Secondary | ICD-10-CM | POA: Diagnosis not present

## 2022-12-30 MED ORDER — OXYCODONE-ACETAMINOPHEN 5-325 MG PO TABS
ORAL_TABLET | ORAL | Status: AC
  Filled 2022-12-30: qty 1

## 2022-12-30 MED ORDER — HYDROCODONE-ACETAMINOPHEN 5-325 MG PO TABS
1.0000 | ORAL_TABLET | Freq: Once | ORAL | Status: AC
  Administered 2022-12-30: 1 via ORAL

## 2022-12-30 MED ORDER — HYDROCODONE-ACETAMINOPHEN 5-325 MG PO TABS
ORAL_TABLET | ORAL | Status: AC
  Filled 2022-12-30: qty 1

## 2022-12-30 MED ORDER — ACETAMINOPHEN-CODEINE 300-30 MG PO TABS: 1.0000 | ORAL_TABLET | Freq: Three times a day (TID) | ORAL | 0 refills | Status: AC | PRN

## 2022-12-30 NOTE — ED Triage Notes (Addendum)
Pt comes with c/o fall today at 2pm. Pt brought over from [facility]. Pt states she lost her balance and fell. Pt is on xarelto. Pt states some rib pain. Pt denies any loc. Pt did hit her head.

## 2022-12-30 NOTE — Discharge Instructions (Addendum)
Your exam, x-rays, and T scans are normal and reassuring.  No sign of a serious head injury, spinal fracture, or rib fracture.  You will experience some chest wall pain because of your fall.  Take OTC Tylenol for nondrowsy pain relief.  Take the prescription Tylenol with codeine as needed for more severe pain.  Follow-up with your primary provider for ongoing concerns as discussed.

## 2022-12-30 NOTE — ED Provider Notes (Signed)
[facility] Emergency Department Provider Note     Event Date/Time   First MD Initiated Contact with Patient 12/30/22 2051     (approximate)   History   Fall   HPI  Sarah Potts is a 89 y.o. female with a history of HTN, DM, HLD, A-fib on Xarelto presents to the ED from [facility].  Apparently had a mechanical fall today around 2 PM.  She reports losing her balance when she fell.  She does endorse some rib pain but denies any head injury or LOC.  He presents to the ED for evaluation following a fall with no acute complaints.  Physical Exam   Triage Vital Signs: ED Triage Vitals  Encounter Vitals Group     BP 12/30/22 1835 123/76     Systolic BP Percentile --      Diastolic BP Percentile --      Pulse Rate 12/30/22 1835 (!) 59     Resp 12/30/22 1835 18     Temp 12/30/22 1835 98 F (36.7 C)     Temp Source 12/30/22 1835 Oral     SpO2 12/30/22 1835 97 %     Weight --      Height --      Head Circumference --      Peak Flow --      Pain Score 12/30/22 1828 5     Pain Loc --      Pain Education --      Exclude from Growth Chart --     Most recent vital signs: Vitals:   12/30/22 1835 12/30/22 2218  BP: 123/76 (!) 148/69  Pulse: (!) 59 62  Resp: 18 18  Temp: 98 F (36.7 C)   SpO2: 97% 96%    General Awake, no distress. NAD HEENT NCAT. PERRL. EOMI. No rhinorrhea. Mucous membranes are moist.  CV:  Good peripheral perfusion. RRR RESP:  Normal effort. CTA.  Tender to palpation along the lateral chest wall on the right side but no obvious chest wall ecchymosis, deformity, or nonsynchronous chest rise. ABD:  No distention.  Soft and nontender. MSK:  Normal spinal alignment without midline tenderness, spasm, deformity, or step-off.  Full active range of motion of all extremities on exam.  ED Results / Procedures / Treatments   Labs (all labs ordered are listed, but only abnormal results are displayed) Labs Reviewed - No data to  display   EKG   RADIOLOGY  DG Ribs Unilateral W/Chest Left  Result Date: 12/30/2022 CLINICAL DATA:  Left rib pain following a fall today. EXAM: LEFT RIBS AND CHEST - 3+ VIEW COMPARISON:  12/20/2021 FINDINGS: Normal sized heart. Tortuous and partially calcified thoracic aorta. Clear lungs with normal vascularity. No rib fracture or pneumothorax seen. Thoracic spine degenerative changes. IMPRESSION: 1. No rib fracture or pneumothorax seen. 2. No active disease. Electronically Signed   By: Beckie Salts M.D.   On: 12/30/2022 19:42   CT HEAD WO CONTRAST ( )  Result Date: 12/30/2022 CLINICAL DATA:  Fall on Xarelto.  Rib pain.  Head trauma. EXAM: CT HEAD WITHOUT CONTRAST CT CERVICAL SPINE WITHOUT CONTRAST TECHNIQUE: Multidetector CT imaging of the head and cervical spine was performed following the standard protocol without intravenous contrast. Multiplanar CT image reconstructions of the cervical spine were also generated. RADIATION DOSE REDUCTION: This exam was performed according to the departmental dose-optimization program which includes automated exposure control, adjustment of the mA and/or kV according to patient size and/or use  of iterative reconstruction technique. COMPARISON:  CT abdomen cervical spine 07/30/2022 FINDINGS: CT HEAD FINDINGS Brain: No intracranial hemorrhage, mass effect, or evidence of acute infarct. No hydrocephalus. No extra-axial fluid collection. Age related cerebral atrophy and chronic small vessel ischemic disease. Vascular: No hyperdense vessel. Intracranial arterial calcification. Skull: No fracture or focal lesion. Sinuses/Orbits: No acute finding. Other: None. CT CERVICAL SPINE FINDINGS Alignment: No evidence of traumatic malalignment. Skull base and vertebrae: No acute fracture. No primary bone lesion or focal pathologic process. Soft tissues and spinal canal: No prevertebral fluid or swelling. No visible canal hematoma. Disc levels: Degenerative pannus formation about  the atlantoaxial joint. Multilevel spondylosis, disc space height loss, degenerative endplate change greatest at C5-C6 and C6-C7 where it is advanced. Bulky anterior osteophytes at C5-C7. Mild multilevel facet arthropathy. Posterior disc osteophyte complexes cause multilevel spinal canal narrowing which is greatest at C4-C5 and C5-C6 where it is moderate. Upper chest: No acute abnormality. Other: Carotid calcification. IMPRESSION: 1. No acute intracranial abnormality. Generalized atrophy and small vessel white matter disease. 2. No acute fracture in the cervical spine. Multilevel degenerative spondylosis. Electronically Signed   By: Minerva Fester M.D.   On: 12/30/2022 19:40   CT Cervical Spine Wo Contrast  Result Date: 12/30/2022 CLINICAL DATA:  Fall on Xarelto.  Rib pain.  Head trauma. EXAM: CT HEAD WITHOUT CONTRAST CT CERVICAL SPINE WITHOUT CONTRAST TECHNIQUE: Multidetector CT imaging of the head and cervical spine was performed following the standard protocol without intravenous contrast. Multiplanar CT image reconstructions of the cervical spine were also generated. RADIATION DOSE REDUCTION: This exam was performed according to the departmental dose-optimization program which includes automated exposure control, adjustment of the mA and/or kV according to patient size and/or use of iterative reconstruction technique. COMPARISON:  CT abdomen cervical spine 07/30/2022 FINDINGS: CT HEAD FINDINGS Brain: No intracranial hemorrhage, mass effect, or evidence of acute infarct. No hydrocephalus. No extra-axial fluid collection. Age related cerebral atrophy and chronic small vessel ischemic disease. Vascular: No hyperdense vessel. Intracranial arterial calcification. Skull: No fracture or focal lesion. Sinuses/Orbits: No acute finding. Other: None. CT CERVICAL SPINE FINDINGS Alignment: No evidence of traumatic malalignment. Skull base and vertebrae: No acute fracture. No primary bone lesion or focal pathologic  process. Soft tissues and spinal canal: No prevertebral fluid or swelling. No visible canal hematoma. Disc levels: Degenerative pannus formation about the atlantoaxial joint. Multilevel spondylosis, disc space height loss, degenerative endplate change greatest at C5-C6 and C6-C7 where it is advanced. Bulky anterior osteophytes at C5-C7. Mild multilevel facet arthropathy. Posterior disc osteophyte complexes cause multilevel spinal canal narrowing which is greatest at C4-C5 and C5-C6 where it is moderate. Upper chest: No acute abnormality. Other: Carotid calcification. IMPRESSION: 1. No acute intracranial abnormality. Generalized atrophy and small vessel white matter disease. 2. No acute fracture in the cervical spine. Multilevel degenerative spondylosis. Electronically Signed   By: Minerva Fester M.D.   On: 12/30/2022 19:40     PROCEDURES:  Critical Care performed: No  Procedures   MEDICATIONS ORDERED IN ED: Medications  HYDROcodone-acetaminophen (NORCO/VICODIN) 5-325 MG per tablet 1 tablet (1 tablet Oral Given 12/30/22 2218)     IMPRESSION / MDM / ASSESSMENT AND PLAN / ED COURSE  I reviewed the triage vital signs and the nursing notes.                              Differential diagnosis includes, but is not limited to, SDH, closed head  injury, cervical fracture, cervical radiculopathy, rib fracture, chest contusion, pneumothorax  Patient's presentation is most consistent with acute complicated illness / injury requiring diagnostic workup.  Patient's diagnosis is consistent with mechanical fall resulting in chest wall contusion and myalgias.  Patient with reassuring exam and workup at this time.  CTs and chest x-ray negative based on my interpretation for any acute findings.. Patient will be discharged home with instructions to take OTC Tylenol as needed. Patient is to follow up with primary provider as discussed, as needed or otherwise directed. Patient is given ED precautions to return to  the ED for any worsening or new symptoms.     FINAL CLINICAL IMPRESSION(S) / ED DIAGNOSES   Final diagnoses:  Fall in home, initial encounter  Chest wall contusion, right, initial encounter     Rx / DC Orders   ED Discharge Orders          Ordered    acetaminophen-codeine (TYLENOL #3) 300-30 MG tablet  Every 8 hours PRN        12/30/22 2210             Note:  This document was prepared using Dragon voice recognition software and may include unintentional dictation errors.    Lissa Hoard, PA-C 12/31/22 2329    Merwyn Katos, MD 01/02/23 [phone]

## 2023-01-06 ENCOUNTER — Ambulatory Visit (HOSPITAL_COMMUNITY): Payer: HMO | Admitting: Nurse Practitioner

## 2023-01-08 ENCOUNTER — Encounter: Payer: Self-pay | Admitting: Oncology

## 2023-01-09 ENCOUNTER — Ambulatory Visit: Payer: Medicare HMO | Attending: Cardiovascular Disease | Admitting: Cardiovascular Disease

## 2023-01-09 ENCOUNTER — Encounter: Payer: Self-pay | Admitting: Cardiovascular Disease

## 2023-01-09 VITALS — BP 128/80 | HR 64 | Ht 62.0 in | Wt 189.1 lb

## 2023-01-09 DIAGNOSIS — I5181 Takotsubo syndrome: Secondary | ICD-10-CM

## 2023-01-09 DIAGNOSIS — I255 Ischemic cardiomyopathy: Secondary | ICD-10-CM

## 2023-01-09 DIAGNOSIS — I25118 Atherosclerotic heart disease of native coronary artery with other forms of angina pectoris: Secondary | ICD-10-CM

## 2023-01-09 DIAGNOSIS — E782 Mixed hyperlipidemia: Secondary | ICD-10-CM | POA: Diagnosis not present

## 2023-01-09 DIAGNOSIS — I428 Other cardiomyopathies: Secondary | ICD-10-CM

## 2023-01-09 DIAGNOSIS — Z79899 Other long term (current) drug therapy: Secondary | ICD-10-CM

## 2023-01-09 DIAGNOSIS — N1831 Chronic kidney disease, stage 3a: Secondary | ICD-10-CM

## 2023-01-09 DIAGNOSIS — I10 Essential (primary) hypertension: Secondary | ICD-10-CM | POA: Diagnosis not present

## 2023-01-09 DIAGNOSIS — E1159 Type 2 diabetes mellitus with other circulatory complications: Secondary | ICD-10-CM

## 2023-01-09 DIAGNOSIS — I480 Paroxysmal atrial fibrillation: Secondary | ICD-10-CM

## 2023-01-09 MED ORDER — ATORVASTATIN CALCIUM 40 MG PO TABS: 40.0000 mg | ORAL_TABLET | Freq: Every morning | ORAL | 3 refills | Status: DC

## 2023-01-09 NOTE — Progress Notes (Signed)
Shortness of cardiology Office Note  Date:  01/09/2023   ID:  Sarah Potts, Sarah Potts [birth year], MRN [id number]  PCP:  Sarah Mina, MD   Chief Complaint  Patient presents with   6 month follow up     Patient c/o shortness of breath with walking & has bilateral LE edema since starting the amlodipine. Medications reviewed by the patient verbally.     HPI:  Sarah Potts is a very pleasant 89 year old woman with a history of  coronary artery disease diagnosed by cardiac catheterization in 2006,  PCI placed December 2019 in [place] to the left circumflex diabetes,  hypertension,  hyperlipidemia  Anal cancer Atrial fibrillation 2016 in [place] In hospital 9/23, chest pain, cath with mild to moderate nonobstructive disease, medical management recommended January 2024 ejection fraction 60% Atrial fibrillation August 2023 who presents for follow-up of her cad, PCI in 2019, atrial fibrillation  Last seen by myself in clinic March 2024 Several years ago,  Moved to [place] to be with her daughter Following [place] moved to [place] now back to [place]  Seen in the emergency room December 30, 2018 for mechanical fall at home, lost her balance Chest wall contusion Chest is still sore on today's visit  Reports that she has been on amlodipine for quite some time, this did not show up on her medication list States that she got the medication when she was living in [place] and has stayed on it Concerned it could be contributing to leg swelling  Seen by urgent care: For leg swelling Was given Lasix 20 mg 3 days  Denies significant chest pain concerning for angina  Lab work reviewed A1c 7.3 Total cholesterol 180 LDL 89 CR 1.7 in March 2024 down to 1.4 in July 2024 Baseline creatinine 1.2  EKG personally reviewed by myself on todays visit EKG Interpretation Date/Time:  Friday January 09 2023 08:41:42 EDT Ventricular Rate:  64 PR Interval:  222 QRS Duration:  148 QT  Interval:  470 QTC Calculation: 484 R Axis:   -17  Text Interpretation: Sinus rhythm with 1st degree A-V block with occasional Premature ventricular complexes Left bundle branch block When compared with ECG of 20-Dec-2021 14:01, No significant change was found Confirmed by Sarah Potts [phone]) on 01/09/2023 8:46:12 AM   Other past medical history reviewed In hospital 9/23, chest pain No clear culprit is identified for elevated troponin. Cardiac cath:   Ost RCA lesion is 30% stenosed.   Prox RCA lesion is 40% stenosed.   Dist RCA lesion is 20% stenosed.   Mid Cx to Dist Cx lesion is 10% stenosed.   Prox Cx lesion is 40% stenosed.   Prox LAD to Mid LAD lesion is 50% stenosed.   Ost LAD to Prox LAD lesion is 40% stenosed.   1.  Mild to moderate nonobstructive coronary artery disease.  Patent left circumflex stent with no significant restenosis.  Moderately to severely calcified coronary arteries. 2.  Left ventricular angiography was not performed due to chronic kidney disease.  EF was mildly reduced by echo.  Mildly elevated left ventricular end-diastolic pressure.   Echo 1/24 EF 60-65%, previous study was 40-45% in atrial fibrillation  03/25/18 had chest pain while living in [place] N/V on arrival, suspected non-STEMI, taken to the catheterization lab had stent placed, [employer]. Synergy 2.5 x 16 mm to LCX  While living in [place], timing unclear, reports developing tachypalpitations, seen in the hospital Diagnosed with atrial fibrillation, started on Eliquis and metoprolol Reports  she is now taking Xarelto   PMH:   has a past medical history of Anal cancer (HCC), Diabetes mellitus, Hyperlipidemia, and Hypertension.  PSH:    Past Surgical History:  Procedure Laterality Date   BLADDER SURGERY     CARDIAC CATHETERIZATION     COLOSTOMY     CORONARY ANGIOPLASTY WITH STENT PLACEMENT  03/25/2018   Stent to the LCX  2.50 mm x 16mm  [employer] Synergy REF  [id number]   heart stent     LEFT HEART CATH AND CORONARY ANGIOGRAPHY N/A 12/23/2021   Procedure: LEFT HEART CATH AND CORONARY ANGIOGRAPHY;  Surgeon: Sarah Ouch, MD;  Location: [facility] INVASIVE CV LAB;  Service: Cardiovascular;  Laterality: N/A;   TONSILLECTOMY     uterus surgery     removed    Current Outpatient Medications  Medication Sig Dispense Refill   acetaminophen (TYLENOL) 500 MG tablet Take 500 mg by mouth every 4 (four) hours as needed.     albuterol (VENTOLIN HFA) 108 (90 Base) MCG/ACT inhaler Inhale 1 puff into the lungs every 4 (four) hours as needed.     amiodarone (PACERONE) 200 MG tablet Take 1 tablet (200 mg total) by mouth daily. 90 tablet 3   amLODipine (NORVASC) 5 MG tablet Take 1 tablet by mouth daily.     aspirin EC 81 MG tablet Take 81 mg by mouth daily.     atorvastatin (LIPITOR) 20 MG tablet Take 1 tablet (20 mg total) by mouth in the morning. For cholesterol 90 tablet 1   Cholecalciferol 25 MCG (1000 UT) tablet Take by mouth. Pt takes 3 daily     Citalopram Hydrobromide (CELEXA PO)      Cyanocobalamin 1000 MCG/ML KIT Inject into the muscle every 30 (thirty) days.     ezetimibe (ZETIA) 10 MG tablet TAKE 1 TABLET (10 MG TOTAL) BY MOUTH IN THE MORNING. FOR CHOLESTEROL 90 tablet 0   fexofenadine (ALLEGRA) 60 MG tablet Take 60 mg by mouth daily.     losartan (COZAAR) 100 MG tablet Take 1 tablet (100 mg total) by mouth daily. 90 tablet 3   magnesium oxide (MAG-OX) 400 MG tablet Take 400 mg by mouth daily.     meclizine (ANTIVERT) 25 MG tablet Take 25 mg by mouth 2 (two) times daily as needed.     metoprolol succinate (TOPROL-XL) 50 MG 24 hr tablet Take 1 tablet (50 mg total) by mouth daily. 90 tablet 3   montelukast (SINGULAIR) 10 MG tablet Take 10 mg by mouth at bedtime.       nitroGLYCERIN (NITROSTAT) 0.4 MG SL tablet Place 1 tablet (0.4 mg total) under the tongue every 5 (five) minutes as needed for chest pain. 20 tablet 12   nystatin  (MYCOSTATIN/NYSTOP) powder Apply topically 2 (two) times daily.     omega-3 acid ethyl esters (LOVAZA) 1 g capsule Take 2 g by mouth 2 (two) times daily.       Omega-3 Fatty Acids (FISH OIL) 1000 MG CAPS Take 1,000 mg by mouth daily.     omeprazole (PRILOSEC) 40 MG capsule Take 40 mg by mouth daily.       Omeprazole Magnesium (PRILOSEC PO) daily.     pioglitazone (ACTOS) 30 MG tablet Take 30 mg by mouth daily.     pregabalin (LYRICA) 150 MG capsule Take 150 mg by mouth 2 (two) times daily.     Rivaroxaban (XARELTO) 15 MG TABS tablet TAKE 1 TABLET (15 MG TOTAL) BY MOUTH  DAILY. 90 tablet 1   sertraline (ZOLOFT) 50 MG tablet Take 50 mg by mouth daily.     TRULICITY 1.5 MG/0.5ML SOPN Inject into the skin once a week.     mirabegron ER (MYRBETRIQ) 50 MG TB24 tablet Take by mouth. (Patient not taking: Reported on 07/08/2022)     No current facility-administered medications for this visit.    Allergies:   Levofloxacin, Niacin, and Niacin and related   Social History:  The patient  reports that she has never smoked. She has never used smokeless tobacco. She reports that she does not drink alcohol and does not use drugs.   Family History:   family history includes Breast cancer in her mother; Cancer in her mother and sister; Cervical cancer in her sister; Heart attack in her father; Heart disease in her brother, father, and sister.    Review of Systems: Review of Systems  Constitutional: Negative.   HENT: Negative.    Respiratory: Negative.    Cardiovascular: Negative.   Gastrointestinal: Negative.   Musculoskeletal: Negative.   Neurological: Negative.   Psychiatric/Behavioral: Negative.    All other systems reviewed and are negative.   PHYSICAL EXAM: VS:  BP 128/80 (BP Location: Left Arm, Patient Position: Sitting, Cuff Size: Normal)   Pulse 64   Ht 5\' 2"  (1.575 m)   Wt 189 lb 2 oz (85.8 kg)   SpO2 97%   BMI 34.59 kg/m  , BMI Body mass index is 34.59 kg/m. Constitutional:  oriented  to person, place, and time. No distress.  HENT:  Head: Grossly normal Eyes:  no discharge. No scleral icterus.  Neck: No JVD, no carotid bruits  Cardiovascular: Regular rate and rhythm, no murmurs appreciated Pulmonary/Chest: Clear to auscultation bilaterally, no wheezes or rails Abdominal: Soft.  no distension.  no tenderness.  Musculoskeletal: Normal range of motion Neurological:  normal muscle tone. Coordination normal. No atrophy Skin: Skin warm and dry Psychiatric: normal affect, pleasant   Recent Labs: 06/17/2022: ALT 46; BUN 24; Creatinine, Ser 1.70; Hemoglobin 12.2; Platelets 257; Potassium 4.6; Sodium 135    Lipid Panel Lab Results  Component Value Date   CHOL 123 12/21/2021   HDL 42 12/21/2021   LDLCALC 46 12/21/2021   TRIG 177 (H) 12/21/2021    Wt Readings from Last 3 Encounters:  01/09/23 189 lb 2 oz (85.8 kg)  07/30/22 183 lb (83 kg)  07/08/22 185 lb 6.4 oz (84.1 kg)     ASSESSMENT AND PLAN:  Problem List Items Addressed This Visit       Cardiology Problems   Stress-induced cardiomyopathy   Relevant Medications   amLODipine (NORVASC) 5 MG tablet   aspirin EC 81 MG tablet   Ischemic cardiomyopathy   Relevant Medications   amLODipine (NORVASC) 5 MG tablet   aspirin EC 81 MG tablet   PAF (paroxysmal atrial fibrillation) (HCC)   Relevant Medications   amLODipine (NORVASC) 5 MG tablet   aspirin EC 81 MG tablet   Other Relevant Orders   EKG 12-Lead (Completed)   CAD (coronary artery disease) - Primary   Relevant Medications   amLODipine (NORVASC) 5 MG tablet   aspirin EC 81 MG tablet   Other Relevant Orders   EKG 12-Lead (Completed)   HTN (hypertension)   Relevant Medications   amLODipine (NORVASC) 5 MG tablet   aspirin EC 81 MG tablet   Other Relevant Orders   EKG 12-Lead (Completed)   Hyperlipidemia   Relevant Medications   amLODipine (NORVASC) 5 MG  tablet   aspirin EC 81 MG tablet     Other   Stage 3a chronic kidney disease (HCC)    Diabetes mellitus (HCC)   Relevant Medications   aspirin EC 81 MG tablet   pioglitazone (ACTOS) 30 MG tablet   Other Visit Diagnoses     Nonischemic cardiomyopathy (HCC)       Relevant Medications   amLODipine (NORVASC) 5 MG tablet   aspirin EC 81 MG tablet   Other Relevant Orders   EKG 12-Lead (Completed)      Coronary disease with stable angina Stent placed 2019 left circumflex [employer] Synergy stent catheterization September 2023 for chest pain showing nonobstructive disease Recommend she increase Lipitor up to 40 mg daily stay on Zetia 10 Goal LDL less than 70, preferably less than 55 Xarelto in place of aspirin, on beta-blocker, statin  Paroxysmal atrial fibrillation Maintaining normal sinus rhythm Xarelto 15 mg daily Recommend she stop aspirin  Essential hypertension Recommend she continue metoprolol succinate losartan  Stop amlodipine likely contributing to leg swelling Reports that she received this in [place] Stop furosemide which was not on her medication list but provided by urgent care for leg swelling  Hyperlipidemia Lipitor 40 daily with Zetia 10 daily Stress the importance of medication compliance  Chronic renal sufficiency Prior baseline 1.2 up to 1.7 presumably after Lasix down to 1.4 in July 2024  Paroxysmal atrial fibrillation Continue Xarelto 15 daily, amiodarone 200 daily, metoprolol succinate Check TSH today    Total encounter time more than 40 minutes  Greater than 50% was spent in counseling and coordination of care with the patient    Signed, Dossie Arbour, M.D., Ph.D. [facility] [place], [place] [phone]

## 2023-01-09 NOTE — Patient Instructions (Addendum)
  Please monitor blood pressure  Medication Instructions:  Stop amlodipine, this can cause leg swelling  Stop aspirin   Please increase the atorvastatin up to 40 mg daily  If you need a refill on your cardiac medications before your next appointment, please call your pharmacy.   Lab work: BMP and TSH  Testing/Procedures: No new testing needed  Follow-Up: At [facility], you and your health needs are our priority.  As part of our continuing mission to provide you with exceptional heart care, we have created designated Provider Care Teams.  These Care Teams include your primary Cardiologist (physician) and Advanced Practice Providers (APPs -  Physician Assistants and Nurse Practitioners) who all work together to provide you with the care you need, when you need it.  You will need a follow up appointment in 6 months  Providers on your designated Care Team:   Nicolasa Ducking, NP Eula Listen, PA-C Cadence Fransico Michael, [place]  COVID-19 Vaccine Information can be found at: [email or web address] For questions related to vaccine distribution or appointments, please email [email or web address] or call [phone].

## 2023-01-10 LAB — BASIC METABOLIC PANEL
BUN/Creatinine Ratio: 17 (ref 12–28)
BUN: 20 mg/dL (ref 8–27)
CO2: 20 mmol/L (ref 20–29)
Calcium: 9.7 mg/dL (ref 8.7–10.3)
Chloride: 103 mmol/L (ref 96–106)
Creatinine, Ser: 1.21 mg/dL — ABNORMAL HIGH (ref 0.57–1.00)
Glucose: 156 mg/dL — ABNORMAL HIGH (ref 70–99)
Potassium: 5.1 mmol/L (ref 3.5–5.2)
Sodium: 138 mmol/L (ref 134–144)
eGFR: 43 mL/min/{1.73_m2} — ABNORMAL LOW (ref >59)

## 2023-01-10 LAB — TSH: TSH: 5.5 u[IU]/mL — ABNORMAL HIGH (ref 0.450–4.500)

## 2023-01-15 ENCOUNTER — Other Ambulatory Visit: Payer: Self-pay | Admitting: Cardiovascular Disease

## 2023-01-15 DIAGNOSIS — I4891 Unspecified atrial fibrillation: Secondary | ICD-10-CM

## 2023-01-16 NOTE — Telephone Encounter (Signed)
Prescription refill request for Xarelto received.  Indication: Afib  Last office visit: 01/09/23 Mariah Milling)  Weight: 85.8kg Age: 89 Scr: 1.21 (01/09/23)  CrCl: 43.67ml/min  Appropriate dose. Refill sent.

## 2023-01-27 ENCOUNTER — Telehealth: Payer: Self-pay | Admitting: Cardiovascular Disease

## 2023-01-27 DIAGNOSIS — R7989 Other specified abnormal findings of blood chemistry: Secondary | ICD-10-CM

## 2023-01-27 NOTE — Telephone Encounter (Signed)
Left message for pt to call back  °

## 2023-01-27 NOTE — Telephone Encounter (Signed)
Patient is returning call in regards to results. Requesting call back. 

## 2023-01-27 NOTE — Telephone Encounter (Signed)
  Jani Gravel, RN 01/27/2023 10:11 AM EDT Back to Top    Called patient and left message for call back.   Antonieta Iba, MD 01/26/2023  2:51 PM EDT     Lab work reviewed Improved renal function, down to baseline Mild increase in TSH, Would recommend repeat TSH in 3 months   Jefferey Pica, RN 01/12/2023  4:53 PM EDT     Preliminary results reviewed. Forwarded to MD desktop for review and signature.

## 2023-01-27 NOTE — Telephone Encounter (Signed)
Spoke w/ pt and reviewed results. She verbalizes understanding. She inquired about her anemia, but advised her that this was not checked during labs. She has an appt coming up w/ PCP and will have them check this for her. She is appreciative of the call.  Order placed for TSH in 3 months.

## 2023-01-29 ENCOUNTER — Telehealth: Payer: Self-pay | Admitting: Emergency Medicine

## 2023-01-29 ENCOUNTER — Encounter: Payer: Self-pay | Admitting: Emergency Medicine

## 2023-01-29 DIAGNOSIS — Z79899 Other long term (current) drug therapy: Secondary | ICD-10-CM

## 2023-01-29 NOTE — Telephone Encounter (Signed)
Called and spoke with patient. Notified her of the following from Dr. Mariah Milling.   Lab work reviewed Improved renal function, down to baseline Mild increase in TSH, Would recommend repeat TSH in 3 months   Patient verbalizes understanding. Orders placed for TSH.

## 2023-03-06 ENCOUNTER — Inpatient Hospital Stay
Admission: EM | Admit: 2023-03-06 | Discharge: 2023-03-13 | DRG: 563 | Disposition: A | Discharge status: Skilled Nursing Facility | Payer: Medicare HMO | Attending: Internal Medicine | Admitting: Internal Medicine

## 2023-03-06 ENCOUNTER — Other Ambulatory Visit: Payer: Self-pay

## 2023-03-06 ENCOUNTER — Emergency Department: Payer: Medicare HMO

## 2023-03-06 DIAGNOSIS — S82154A Nondisplaced fracture of right tibial tuberosity, initial encounter for closed fracture: Principal | ICD-10-CM | POA: Diagnosis present

## 2023-03-06 DIAGNOSIS — Z955 Presence of coronary angioplasty implant and graft: Secondary | ICD-10-CM

## 2023-03-06 DIAGNOSIS — Z79899 Other long term (current) drug therapy: Secondary | ICD-10-CM

## 2023-03-06 DIAGNOSIS — Z7901 Long term (current) use of anticoagulants: Secondary | ICD-10-CM

## 2023-03-06 DIAGNOSIS — S80212A Abrasion, left knee, initial encounter: Secondary | ICD-10-CM | POA: Diagnosis present

## 2023-03-06 DIAGNOSIS — Z7984 Long term (current) use of oral hypoglycemic drugs: Secondary | ICD-10-CM

## 2023-03-06 DIAGNOSIS — I5022 Chronic systolic (congestive) heart failure: Secondary | ICD-10-CM | POA: Diagnosis present

## 2023-03-06 DIAGNOSIS — E875 Hyperkalemia: Secondary | ICD-10-CM | POA: Diagnosis present

## 2023-03-06 DIAGNOSIS — D631 Anemia in chronic kidney disease: Secondary | ICD-10-CM | POA: Diagnosis present

## 2023-03-06 DIAGNOSIS — E1159 Type 2 diabetes mellitus with other circulatory complications: Secondary | ICD-10-CM | POA: Diagnosis not present

## 2023-03-06 DIAGNOSIS — Z933 Colostomy status: Secondary | ICD-10-CM

## 2023-03-06 DIAGNOSIS — Z803 Family history of malignant neoplasm of breast: Secondary | ICD-10-CM

## 2023-03-06 DIAGNOSIS — M11262 Other chondrocalcinosis, left knee: Secondary | ICD-10-CM | POA: Diagnosis present

## 2023-03-06 DIAGNOSIS — E119 Type 2 diabetes mellitus without complications: Secondary | ICD-10-CM

## 2023-03-06 DIAGNOSIS — Z8249 Family history of ischemic heart disease and other diseases of the circulatory system: Secondary | ICD-10-CM

## 2023-03-06 DIAGNOSIS — S82153A Displaced fracture of unspecified tibial tuberosity, initial encounter for closed fracture: Secondary | ICD-10-CM | POA: Diagnosis not present

## 2023-03-06 DIAGNOSIS — F32A Depression, unspecified: Secondary | ICD-10-CM | POA: Diagnosis present

## 2023-03-06 DIAGNOSIS — Z8049 Family history of malignant neoplasm of other genital organs: Secondary | ICD-10-CM

## 2023-03-06 DIAGNOSIS — W1789XA Other fall from one level to another, initial encounter: Secondary | ICD-10-CM | POA: Diagnosis present

## 2023-03-06 DIAGNOSIS — I5032 Chronic diastolic (congestive) heart failure: Secondary | ICD-10-CM | POA: Diagnosis present

## 2023-03-06 DIAGNOSIS — R7401 Elevation of levels of liver transaminase levels: Secondary | ICD-10-CM | POA: Diagnosis not present

## 2023-03-06 DIAGNOSIS — I482 Chronic atrial fibrillation, unspecified: Secondary | ICD-10-CM | POA: Diagnosis present

## 2023-03-06 DIAGNOSIS — N1831 Chronic kidney disease, stage 3a: Secondary | ICD-10-CM | POA: Diagnosis present

## 2023-03-06 DIAGNOSIS — Z7985 Long-term (current) use of injectable non-insulin antidiabetic drugs: Secondary | ICD-10-CM

## 2023-03-06 DIAGNOSIS — M1990 Unspecified osteoarthritis, unspecified site: Secondary | ICD-10-CM | POA: Diagnosis present

## 2023-03-06 DIAGNOSIS — I10 Essential (primary) hypertension: Secondary | ICD-10-CM | POA: Diagnosis present

## 2023-03-06 DIAGNOSIS — W19XXXA Unspecified fall, initial encounter: Secondary | ICD-10-CM | POA: Diagnosis not present

## 2023-03-06 DIAGNOSIS — M11261 Other chondrocalcinosis, right knee: Secondary | ICD-10-CM | POA: Diagnosis present

## 2023-03-06 DIAGNOSIS — I130 Hypertensive heart and chronic kidney disease with heart failure and stage 1 through stage 4 chronic kidney disease, or unspecified chronic kidney disease: Secondary | ICD-10-CM | POA: Diagnosis present

## 2023-03-06 DIAGNOSIS — N1832 Chronic kidney disease, stage 3b: Secondary | ICD-10-CM | POA: Diagnosis present

## 2023-03-06 DIAGNOSIS — E785 Hyperlipidemia, unspecified: Secondary | ICD-10-CM | POA: Diagnosis present

## 2023-03-06 DIAGNOSIS — I2510 Atherosclerotic heart disease of native coronary artery without angina pectoris: Secondary | ICD-10-CM | POA: Diagnosis present

## 2023-03-06 DIAGNOSIS — J45909 Unspecified asthma, uncomplicated: Secondary | ICD-10-CM | POA: Diagnosis present

## 2023-03-06 DIAGNOSIS — S82201A Unspecified fracture of shaft of right tibia, initial encounter for closed fracture: Secondary | ICD-10-CM | POA: Insufficient documentation

## 2023-03-06 DIAGNOSIS — Z85048 Personal history of other malignant neoplasm of rectum, rectosigmoid junction, and anus: Secondary | ICD-10-CM

## 2023-03-06 DIAGNOSIS — Z6834 Body mass index (BMI) 34.0-34.9, adult: Secondary | ICD-10-CM

## 2023-03-06 DIAGNOSIS — E669 Obesity, unspecified: Secondary | ICD-10-CM | POA: Diagnosis present

## 2023-03-06 DIAGNOSIS — E1165 Type 2 diabetes mellitus with hyperglycemia: Secondary | ICD-10-CM | POA: Diagnosis present

## 2023-03-06 DIAGNOSIS — I480 Paroxysmal atrial fibrillation: Secondary | ICD-10-CM | POA: Diagnosis present

## 2023-03-06 DIAGNOSIS — Y9289 Other specified places as the place of occurrence of the external cause: Secondary | ICD-10-CM

## 2023-03-06 DIAGNOSIS — E66811 Obesity, class 1: Secondary | ICD-10-CM | POA: Diagnosis present

## 2023-03-06 DIAGNOSIS — E1122 Type 2 diabetes mellitus with diabetic chronic kidney disease: Secondary | ICD-10-CM | POA: Diagnosis present

## 2023-03-06 DIAGNOSIS — S80211A Abrasion, right knee, initial encounter: Secondary | ICD-10-CM | POA: Diagnosis present

## 2023-03-06 DIAGNOSIS — N179 Acute kidney failure, unspecified: Secondary | ICD-10-CM | POA: Diagnosis not present

## 2023-03-06 HISTORY — DX: Anemia, unspecified: D64.9

## 2023-03-06 HISTORY — DX: Heart failure, unspecified: I50.9

## 2023-03-06 HISTORY — DX: Unspecified asthma, uncomplicated: J45.909

## 2023-03-06 LAB — COMPREHENSIVE METABOLIC PANEL
ALT: 64 U/L — ABNORMAL HIGH (ref 0–44)
AST: 94 U/L — ABNORMAL HIGH (ref 15–41)
Albumin: 3.5 g/dL (ref 3.5–5.0)
Alkaline Phosphatase: 75 U/L (ref 38–126)
Anion gap: 10 (ref 5–15)
BUN: 39 mg/dL — ABNORMAL HIGH (ref 8–23)
CO2: 22 mmol/L (ref 22–32)
Calcium: 8.6 mg/dL — ABNORMAL LOW (ref 8.9–10.3)
Chloride: 100 mmol/L (ref 98–111)
Creatinine, Ser: 1.85 mg/dL — ABNORMAL HIGH (ref 0.44–1.00)
GFR, Estimated: 26 mL/min — ABNORMAL LOW (ref >60)
Glucose, Bld: 257 mg/dL — ABNORMAL HIGH (ref 70–99)
Potassium: 3.9 mmol/L (ref 3.5–5.1)
Sodium: 132 mmol/L — ABNORMAL LOW (ref 135–145)
Total Bilirubin: 0.7 mg/dL (ref <1.2)
Total Protein: 6.7 g/dL (ref 6.5–8.1)

## 2023-03-06 LAB — MAGNESIUM: Magnesium: 1.4 mg/dL — ABNORMAL LOW (ref 1.7–2.4)

## 2023-03-06 LAB — CBC WITH DIFFERENTIAL/PLATELET
Abs Immature Granulocytes: 0.07 10*3/uL (ref 0.00–0.07)
Basophils Absolute: 0 10*3/uL (ref 0.0–0.1)
Basophils Relative: 0 %
Eosinophils Absolute: 0.1 10*3/uL (ref 0.0–0.5)
Eosinophils Relative: 1 %
HCT: 35.3 % — ABNORMAL LOW (ref 36.0–46.0)
Hemoglobin: 11.5 g/dL — ABNORMAL LOW (ref 12.0–15.0)
Immature Granulocytes: 1 %
Lymphocytes Relative: 19 %
Lymphs Abs: 1.3 10*3/uL (ref 0.7–4.0)
MCH: 31.9 pg (ref 26.0–34.0)
MCHC: 32.6 g/dL (ref 30.0–36.0)
MCV: 97.8 fL (ref 80.0–100.0)
Monocytes Absolute: 0.5 10*3/uL (ref 0.1–1.0)
Monocytes Relative: 8 %
Neutro Abs: 4.9 10*3/uL (ref 1.7–7.7)
Neutrophils Relative %: 71 %
Platelets: 279 10*3/uL (ref 150–400)
RBC: 3.61 MIL/uL — ABNORMAL LOW (ref 3.87–5.11)
RDW: 13.6 % (ref 11.5–15.5)
WBC: 6.9 10*3/uL (ref 4.0–10.5)
nRBC: 0 % (ref 0.0–0.2)

## 2023-03-06 LAB — HEPARIN LEVEL (UNFRACTIONATED): Heparin Unfractionated: >1.1 [IU]/mL — ABNORMAL HIGH (ref 0.30–0.70)

## 2023-03-06 LAB — TSH: TSH: 4.834 u[IU]/mL — ABNORMAL HIGH (ref 0.350–4.500)

## 2023-03-06 MED ORDER — MORPHINE SULFATE (PF) 2 MG/ML IV SOLN
2.0000 mg | INTRAVENOUS | Status: DC | PRN
  Administered 2023-03-07: 2 mg via INTRAVENOUS
  Filled 2023-03-06: qty 1

## 2023-03-06 MED ORDER — SODIUM CHLORIDE 0.9% FLUSH
3.0000 mL | Freq: Two times a day (BID) | INTRAVENOUS | Status: DC
Start: 2023-03-06 — End: 2023-03-07

## 2023-03-06 MED ORDER — ACETAMINOPHEN 325 MG PO TABS
650.0000 mg | ORAL_TABLET | Freq: Four times a day (QID) | ORAL | Status: DC | PRN
  Administered 2023-03-08 – 2023-03-10 (×2): 650 mg via ORAL
  Filled 2023-03-06 (×3): qty 2

## 2023-03-06 MED ORDER — HYDRALAZINE HCL 20 MG/ML IJ SOLN: 10.0000 mg | Freq: Four times a day (QID) | INTRAMUSCULAR | Status: DC | PRN

## 2023-03-06 MED ORDER — HYDROCODONE-ACETAMINOPHEN 5-325 MG PO TABS
1.0000 | ORAL_TABLET | ORAL | Status: DC | PRN
  Administered 2023-03-07 (×2): 1 via ORAL
  Filled 2023-03-06 (×2): qty 1

## 2023-03-06 MED ORDER — LACTATED RINGERS IV SOLN
INTRAVENOUS | Status: DC
Start: 2023-03-06 — End: 2023-03-07

## 2023-03-06 MED ORDER — ACETAMINOPHEN 650 MG RE SUPP: 650.0000 mg | Freq: Four times a day (QID) | RECTAL | Status: DC | PRN

## 2023-03-06 MED ORDER — ACETAMINOPHEN 325 MG PO TABS
650.0000 mg | ORAL_TABLET | Freq: Once | ORAL | Status: AC
  Administered 2023-03-06: 650 mg via ORAL
  Filled 2023-03-06: qty 2

## 2023-03-06 NOTE — H&P (Incomplete)
History and Physical    Patient: Sarah Potts:[id number] DOB: [birth year] DOA: 03/06/2023 DOS: the patient was seen and examined on 03/06/2023 PCP: Jerl Mina, MD  Patient coming from: Home  Chief Complaint:  Chief Complaint  Patient presents with   Fall   HPI: Sarah Potts is a 89 y.o. female with medical history significant for A.Fib , Htn, Hyperlipidemia,  Following a fall while walking on curb and has misstep and was witnessed and did strike her head wit negative head ct . Ct cervical spine and MXF ct which is  negative. Pt does have hematoma .At baseline she lives alone at home. Pt came by ems and son at bedside. The fall is on right side and her knee was injured. Left knee is non injured along with hips . No loc.  Pt takes xarelto for A .Fib  Nonfocal today  on exam,  No head/ no vomiting.  Knee immobilizer and pain control and pt non weight bearing until stable for outpatient, Dr.Howard miller orthopedics.   In emergency room vitals trend shows: Vitals:   03/06/23 1849 03/06/23 1850 03/06/23 2000 03/06/23 2100  BP: (!) 169/52  (!) 145/60 (!) 142/69  Pulse: 60  (!) 58 (!) 52  Resp: 18  18 18   Height:  5\' 2"  (1.575 m)    Weight:  84.8 kg    SpO2: 97%  98% 95%  BMI (Calculated):  34.19     Labs are notable for : -Hyponatremia of 138, AKI on CKD with a creatinine of 1.85, AST 94 ALT 64. -Anemia of  11.5, normal white count normal platelet count.  In the ED pt received: Medications  acetaminophen (TYLENOL) tablet 650 mg (650 mg Oral Given 03/06/23 1933)   Review of Systems  Musculoskeletal:  Positive for falls.  All other systems reviewed and are negative.  Past Medical History:  Diagnosis Date   Anal cancer (HCC)    Diabetes mellitus    Hyperlipidemia    Hypertension    Past Surgical History:  Procedure Laterality Date   BLADDER SURGERY     CARDIAC CATHETERIZATION     COLOSTOMY     CORONARY ANGIOPLASTY WITH STENT PLACEMENT  03/25/2018   Stent to  the LCX  2.50 mm x 16mm  [employer] REF [id number]   heart stent     LEFT HEART CATH AND CORONARY ANGIOGRAPHY N/A 12/23/2021   Procedure: LEFT HEART CATH AND CORONARY ANGIOGRAPHY;  Surgeon: Iran Ouch, MD;  Location: [facility] INVASIVE CV LAB;  Service: Cardiovascular;  Laterality: N/A;   TONSILLECTOMY     uterus surgery     removed    reports that she has never smoked. She has never used smokeless tobacco. She reports that she does not drink alcohol and does not use drugs.  Allergies  Allergen Reactions   Levofloxacin Nausea Only    Patient reports that she thinks that she could take this medication.  Patient reports that she thinks that she could take this medication.     Patient reports that she thinks that she could take this medication.   Niacin Other (See Comments)    Hot, Flushed  Pt states she get real red and hot.    Hot, Flushed   Niacin And Related Other (See Comments)    Pt states she get real red and hot.    Family History  Problem Relation Age of Onset   Cancer Mother    Breast cancer Mother  Heart disease Father    Heart attack Father    Cancer Sister    Heart disease Sister    Cervical cancer Sister    Heart disease Brother     Prior to Admission medications   Medication Sig Start Date End Date Taking? Authorizing Provider  acetaminophen (TYLENOL) 500 MG tablet Take 500 mg by mouth every 4 (four) hours as needed.    [provider]  albuterol (VENTOLIN HFA) 108 (90 Base) MCG/ACT inhaler Inhale 1 puff into the lungs every 4 (four) hours as needed. 11/29/22   [provider]  amiodarone (PACERONE) 200 MG tablet Take 1 tablet (200 mg total) by mouth daily. 07/08/22   Antonieta Iba, MD  atorvastatin (LIPITOR) 40 MG tablet Take 1 tablet (40 mg total) by mouth in the morning. For cholesterol 01/09/23   Antonieta Iba, MD  Cholecalciferol 25 MCG (1000 UT) tablet Take by mouth. Pt takes 3 daily    [provider]  Citalopram Hydrobromide (CELEXA PO) daily.    [provider]  Cyanocobalamin 1000 MCG/ML KIT Inject into the muscle every 30 (thirty) days. 06/05/22   [provider]  ezetimibe (ZETIA) 10 MG tablet TAKE 1 TABLET (10 MG TOTAL) BY MOUTH IN THE MORNING. FOR CHOLESTEROL 12/29/22   Antonieta Iba, MD  fexofenadine (ALLEGRA) 60 MG tablet Take 60 mg by mouth daily. 12/18/22 12/18/23  [provider]  losartan (COZAAR) 100 MG tablet Take 1 tablet (100 mg total) by mouth daily. 07/08/22   Antonieta Iba, MD  magnesium oxide (MAG-OX) 400 MG tablet Take 400 mg by mouth daily.    [provider]  meclizine (ANTIVERT) 25 MG tablet Take 25 mg by mouth 2 (two) times daily as needed.    [provider]  metoprolol succinate (TOPROL-XL) 50 MG 24 hr tablet Take 1 tablet (50 mg total) by mouth daily. 07/08/22   Antonieta Iba, MD  mirabegron ER (MYRBETRIQ) 50 MG TB24 tablet Take by mouth. Patient not taking: Reported on 07/08/2022    [provider]  montelukast (SINGULAIR) 10 MG tablet Take 10 mg by mouth at bedtime.      [provider]  nitroGLYCERIN (NITROSTAT) 0.4 MG SL tablet Place 1 tablet (0.4 mg total) under the tongue every 5 (five) minutes as needed for chest pain. 12/24/21   Enedina Finner, MD  nystatin (MYCOSTATIN/NYSTOP) powder Apply topically 2 (two) times daily. 07/07/22   [provider]  omega-3 acid ethyl esters (LOVAZA) 1 g capsule Take 2 g by mouth 2 (two) times daily.      [provider]  Omega-3 Fatty Acids (FISH OIL) 1000 MG CAPS Take 1,000 mg by mouth daily.    [provider]  omeprazole (PRILOSEC) 40 MG capsule Take 40 mg by mouth daily.      [provider]  Omeprazole Magnesium (PRILOSEC PO) daily.    [provider]  pioglitazone (ACTOS) 30 MG tablet Take 30 mg by mouth daily. 12/18/22 12/18/23  [provider]  pregabalin (LYRICA) 150 MG capsule Take 150 mg by  mouth 2 (two) times daily. 12/18/21   [provider]  sertraline (ZOLOFT) 50 MG tablet Take 50 mg by mouth daily. 08/12/22   [provider]  TRULICITY 1.5 MG/0.5ML SOPN Inject into the skin once a week. 12/16/20   [provider]  XARELTO 15 MG TABS tablet TAKE 1 TABLET (15 MG TOTAL) BY MOUTH DAILY. 01/16/23   Julien Nordmann  J, MD     Vitals:   03/06/23 1849 03/06/23 1850 03/06/23 2000 03/06/23 2100  BP: (!) 169/52  (!) 145/60 (!) 142/69  Pulse: 60  (!) 58 (!) 52  Resp: 18  18 18   SpO2: 97%  98% 95%  Weight:  84.8 kg    Height:  5\' 2"  (1.575 m)     Physical Exam Vitals and nursing note reviewed.  Constitutional:      Appearance: She is not toxic-appearing.  HENT:     Head: Normocephalic and atraumatic.     Right Ear: Hearing normal.     Left Ear: Hearing normal.     Nose: Nose normal. No nasal deformity.     Mouth/Throat:     Lips: Pink.     Tongue: No lesions.     Pharynx: Oropharynx is clear.  Eyes:     General: Lids are normal.     Extraocular Movements: Extraocular movements intact.  Cardiovascular:     Rate and Rhythm: Normal rate and regular rhythm.     Heart sounds: Normal heart sounds.  Pulmonary:     Effort: Pulmonary effort is normal.     Breath sounds: Normal breath sounds.  Abdominal:     General: Bowel sounds are normal. There is no distension.     Palpations: Abdomen is soft. There is no mass.     Tenderness: There is no abdominal tenderness.  Musculoskeletal:        General: Swelling, tenderness and signs of injury present.     Right lower leg: No edema.     Left lower leg: No edema.  Skin:    General: Skin is warm.  Neurological:     General: No focal deficit present.     Mental Status: She is alert and oriented to person, place, and time.     Cranial Nerves: Cranial nerves 2-12 are intact.  Psychiatric:        Attention and Perception: Attention normal.        Mood and Affect: Mood normal.        Speech: Speech normal.         Behavior: Behavior normal. Behavior is cooperative.      Labs on Admission: I have personally reviewed following labs and imaging studies No results found for this or any previous visit (from the past 24 hour(s)).  CBC: No results for input(s): "WBC", "NEUTROABS", "HGB", "HCT", "MCV", "PLT" in the last 168 hours. Basic Metabolic Panel: No results for input(s): "NA", "K", "CL", "CO2", "GLUCOSE", "BUN", "CREATININE", "CALCIUM", "MG", "PHOS" in the last 168 hours. GFR: CrCl cannot be calculated (Patient's most recent lab result is older than the maximum 21 days allowed.). Liver Function Tests: No results for input(s): "AST", "ALT", "ALKPHOS", "BILITOT", "PROT", "ALBUMIN" in the last 168 hours. No results for input(s): "LIPASE", "AMYLASE" in the last 168 hours. No results for input(s): "AMMONIA" in the last 168 hours. Coagulation Profile: No results for input(s): "INR", "PROTIME" in the last 168 hours. Cardiac Enzymes: No results for input(s): "CKTOTAL", "CKMB", "CKMBINDEX", "TROPONINI" in the last 168 hours. BNP (last 3 results) No results for input(s): "PROBNP" in the last 8760 hours. HbA1C: No results for input(s): "HGBA1C" in the last 72 hours. CBG: No results for input(s): "GLUCAP" in the last 168 hours. Lipid Profile: No results for input(s): "CHOL", "HDL", "LDLCALC", "TRIG", "CHOLHDL", "LDLDIRECT" in the last 72 hours. Thyroid Function Tests: No results for input(s): "TSH", "T4TOTAL", "FREET4", "T3FREE", "THYROIDAB" in the last 72  hours. Anemia Panel: No results for input(s): "VITAMINB12", "FOLATE", "FERRITIN", "TIBC", "IRON", "RETICCTPCT" in the last 72 hours. Urinalysis    Component Value Date/Time   COLORURINE STRAW (A) 12/20/2021 1737   APPEARANCEUR CLEAR (A) 12/20/2021 1737   LABSPEC 1.008 12/20/2021 1737   PHURINE 6.0 12/20/2021 1737   GLUCOSEU NEGATIVE 12/20/2021 1737   HGBUR NEGATIVE 12/20/2021 1737   BILIRUBINUR NEGATIVE 12/20/2021 1737   KETONESUR NEGATIVE  12/20/2021 1737   PROTEINUR NEGATIVE 12/20/2021 1737   NITRITE NEGATIVE 12/20/2021 1737   LEUKOCYTESUR NEGATIVE 12/20/2021 1737      Unresulted Labs (From admission, onward)    None       Medications  acetaminophen (TYLENOL) tablet 650 mg (650 mg Oral Given 03/06/23 1933)    Radiological Exams on Admission: DG Hips Bilat W or Wo Pelvis 3-4 Views  Result Date: 03/06/2023 CLINICAL DATA:  Fall EXAM: DG HIP (WITH OR WITHOUT PELVIS) 3-4V BILAT COMPARISON:  None Available. FINDINGS: SI joints are non widened. Pubic symphysis and rami appear intact. No fracture or malalignment. Mild bilateral hip degenerative change. IMPRESSION: No acute osseous abnormality. Electronically Signed   By: Jasmine Pang M.D.   On: 03/06/2023 20:29   DG Knee Complete 4 Views Left  Result Date: 03/06/2023 CLINICAL DATA:  Fall with knee swelling and pain EXAM: LEFT KNEE - COMPLETE 4+ VIEW COMPARISON:  None Available. FINDINGS: No acute fracture or malalignment. Moderate tricompartment arthritis of the knee. Small knee effusion. Chondrocalcinosis. IMPRESSION: No acute osseous abnormality. Moderate tricompartment arthritis with small effusion. Chondrocalcinosis. Electronically Signed   By: Jasmine Pang M.D.   On: 03/06/2023 20:24   DG Knee Complete 4 Views Right  Result Date: 03/06/2023 CLINICAL DATA:  Fall EXAM: RIGHT KNEE - COMPLETE 4+ VIEW COMPARISON:  None Available. FINDINGS: Acute appearing avulsion at the tibial tuberosity with overlying copious soft tissue swelling. No dislocation. Small knee effusion. Probable calcified loose body at the posterior knee. Chondrocalcinosis. Moderate tricompartment arthritis. IMPRESSION: 1. Acute appearing avulsion at the tibial tuberosity with overlying soft tissue swelling. 2. Moderate tricompartment arthritis with chondrocalcinosis and probable calcified loose body at the posterior knee. Electronically Signed   By: Jasmine Pang M.D.   On: 03/06/2023 20:23   CT Head Wo  Contrast  Result Date: 03/06/2023 CLINICAL DATA:  Trauma, fall, facial injury EXAM: CT HEAD WITHOUT CONTRAST CT MAXILLOFACIAL WITHOUT CONTRAST CT CERVICAL SPINE WITHOUT CONTRAST TECHNIQUE: Multidetector CT imaging of the head, cervical spine, and maxillofacial structures were performed using the standard protocol without intravenous contrast. Multiplanar CT image reconstructions of the cervical spine and maxillofacial structures were also generated. RADIATION DOSE REDUCTION: This exam was performed according to the departmental dose-optimization program which includes automated exposure control, adjustment of the mA and/or kV according to patient size and/or use of iterative reconstruction technique. COMPARISON:  12/30/2022 FINDINGS: CT HEAD FINDINGS Brain: No evidence of acute infarction, hemorrhage, hydrocephalus, extra-axial collection or mass lesion/mass effect. Vascular: No hyperdense vessel or unexpected calcification. CT FACIAL BONES FINDINGS Skull: Normal. Negative for fracture or focal lesion. Facial bones: No displaced fractures or dislocations. Sinuses/Orbits: No acute finding. Other: Soft tissue contusion of the right brow (series 2, image 74). Patient is edentulous. CT CERVICAL SPINE FINDINGS Alignment: Degenerative straightening of the normal cervical lordosis. Skull base and vertebrae: No acute fracture. No primary bone lesion or focal pathologic process. Soft tissues and spinal canal: No prevertebral fluid or swelling. No visible canal hematoma. Disc levels: Moderate disc space height loss and osteophytosis, worst at C5-C7. Upper  chest: Negative. Other: None. IMPRESSION: 1. No acute intracranial pathology. 2. No displaced fractures or dislocations of the facial bones. 3. Soft tissue contusion of the right brow. 4. No fracture or static subluxation of the cervical spine. 5. Moderate cervical disc degenerative disease. Electronically Signed   By: Jearld Lesch M.D.   On: 03/06/2023 20:22   CT  Cervical Spine Wo Contrast  Result Date: 03/06/2023 CLINICAL DATA:  Trauma, fall, facial injury EXAM: CT HEAD WITHOUT CONTRAST CT MAXILLOFACIAL WITHOUT CONTRAST CT CERVICAL SPINE WITHOUT CONTRAST TECHNIQUE: Multidetector CT imaging of the head, cervical spine, and maxillofacial structures were performed using the standard protocol without intravenous contrast. Multiplanar CT image reconstructions of the cervical spine and maxillofacial structures were also generated. RADIATION DOSE REDUCTION: This exam was performed according to the departmental dose-optimization program which includes automated exposure control, adjustment of the mA and/or kV according to patient size and/or use of iterative reconstruction technique. COMPARISON:  12/30/2022 FINDINGS: CT HEAD FINDINGS Brain: No evidence of acute infarction, hemorrhage, hydrocephalus, extra-axial collection or mass lesion/mass effect. Vascular: No hyperdense vessel or unexpected calcification. CT FACIAL BONES FINDINGS Skull: Normal. Negative for fracture or focal lesion. Facial bones: No displaced fractures or dislocations. Sinuses/Orbits: No acute finding. Other: Soft tissue contusion of the right brow (series 2, image 74). Patient is edentulous. CT CERVICAL SPINE FINDINGS Alignment: Degenerative straightening of the normal cervical lordosis. Skull base and vertebrae: No acute fracture. No primary bone lesion or focal pathologic process. Soft tissues and spinal canal: No prevertebral fluid or swelling. No visible canal hematoma. Disc levels: Moderate disc space height loss and osteophytosis, worst at C5-C7. Upper chest: Negative. Other: None. IMPRESSION: 1. No acute intracranial pathology. 2. No displaced fractures or dislocations of the facial bones. 3. Soft tissue contusion of the right brow. 4. No fracture or static subluxation of the cervical spine. 5. Moderate cervical disc degenerative disease. Electronically Signed   By: Jearld Lesch M.D.   On: 03/06/2023  20:22   CT Maxillofacial Wo Contrast  Result Date: 03/06/2023 CLINICAL DATA:  Trauma, fall, facial injury EXAM: CT HEAD WITHOUT CONTRAST CT MAXILLOFACIAL WITHOUT CONTRAST CT CERVICAL SPINE WITHOUT CONTRAST TECHNIQUE: Multidetector CT imaging of the head, cervical spine, and maxillofacial structures were performed using the standard protocol without intravenous contrast. Multiplanar CT image reconstructions of the cervical spine and maxillofacial structures were also generated. RADIATION DOSE REDUCTION: This exam was performed according to the departmental dose-optimization program which includes automated exposure control, adjustment of the mA and/or kV according to patient size and/or use of iterative reconstruction technique. COMPARISON:  12/30/2022 FINDINGS: CT HEAD FINDINGS Brain: No evidence of acute infarction, hemorrhage, hydrocephalus, extra-axial collection or mass lesion/mass effect. Vascular: No hyperdense vessel or unexpected calcification. CT FACIAL BONES FINDINGS Skull: Normal. Negative for fracture or focal lesion. Facial bones: No displaced fractures or dislocations. Sinuses/Orbits: No acute finding. Other: Soft tissue contusion of the right brow (series 2, image 74). Patient is edentulous. CT CERVICAL SPINE FINDINGS Alignment: Degenerative straightening of the normal cervical lordosis. Skull base and vertebrae: No acute fracture. No primary bone lesion or focal pathologic process. Soft tissues and spinal canal: No prevertebral fluid or swelling. No visible canal hematoma. Disc levels: Moderate disc space height loss and osteophytosis, worst at C5-C7. Upper chest: Negative. Other: None. IMPRESSION: 1. No acute intracranial pathology. 2. No displaced fractures or dislocations of the facial bones. 3. Soft tissue contusion of the right brow. 4. No fracture or static subluxation of the cervical spine. 5. Moderate cervical  disc degenerative disease. Electronically Signed   By: Jearld Lesch M.D.    On: 03/06/2023 20:22     Data Reviewed: Relevant notes from primary care and specialist visits, past discharge summaries as available in EHR, including Care Everywhere. Prior diagnostic testing as pertinent to current admission diagnoses Updated medications and problem lists for reconciliation ED course, including vitals, labs, imaging, treatment and response to treatment Triage notes, nursing and pharmacy notes and ED provider's notes Notable results as noted in HPI  >>Fall: -EKG -orthostatic vitals. -Sounds mechanical although pt may benefit from gait strengthening and education on fall prevention. -Monitor on telemetry.  -PT consult.   >>Acute appearing avulsion at the tibial tuberosity: -knee immobilizer.  -pain control. -PT for non weight bearing exercises and f/u with orthopedic as outpatient.    >>C/H A.Fib: -CMP. -Continue amiodarone / metoprolol 50 mg.  -Pt on xarelto will check the anti x level.  -Pt took her last dose yesterday , in addition to this there is no cbc to determine if there is any anemia or any bleeding concern although head ct is negative.  -I have requested Pharmacist Barbara Cower to follow up on level / cbc and determine plan for [facility] .   >>DM II: -A1c. -hold actos.  -Glycemic protocol.  -Carb consistent cardiac diet.    >>CAD: -Stable no chest pain / or anginal EQ symptoms.  -Cont metoprolol and atorvastatin.    >>C/H diastolic CHF: -Strict I/O./ weights.  -cont metoprolol. -PRN diuretic therapy per am team.    >>HTN: Vitals:   03/06/23 1849 03/06/23 2000 03/06/23 2100  BP: (!) 169/52 (!) 145/60 (!) 142/69  -continue metoprolol. -continue losartan from tomorrow after we have renal function today . -PRN hydralazine.    >>AKI in Stage IIIa CKD: We will obtain cmp and monitor renal function.  Lab Results  Component Value Date   CREATININE 1.85 (H) 03/06/2023   CREATININE 1.21 (H) 01/09/2023   CREATININE 1.70 (H) 06/17/2022  Avoid  contrast study.  Renally dose meds.   >>Anemia: -?IDA VS CKD or both.  -CBC with diff.     Latest Ref Rng & Units 06/17/2022    3:13 PM 02/10/2022    9:34 AM 12/23/2021    6:15 AM  CBC  WBC 4.0 - 10.5 K/uL 5.8  4.4  3.3   Hemoglobin 12.0 - 15.0 g/dL 16.1  09.6  04.5   Hematocrit 36.0 - 46.0 % 36.6  37.7  31.0   Platelets 150 - 400 K/uL 257  217  185      DVT prophylaxis:  SCD's  Consults:  None   Advance Care Planning:    Code Status: Prior   Family Communication:  Son   Disposition Plan:  TBD   Severity of Illness: The appropriate patient status for this patient is OBSERVATION. Observation status is judged to be reasonable and necessary in order to provide the required intensity of service to ensure the patient's safety. The patient's presenting symptoms, physical exam findings, and initial radiographic and laboratory data in the context of their medical condition is felt to place them at decreased risk for further clinical deterioration. Furthermore, it is anticipated that the patient will be medically stable for discharge from the hospital within 2 midnights of admission.   Author: Gertha Calkin, MD 03/06/2023 9:52 PM  For on call review [email or web address].

## 2023-03-06 NOTE — ED Notes (Signed)
Report given to Samantha Crimes RN

## 2023-03-06 NOTE — ED Provider Notes (Signed)
[facility] Emergency Department Provider Note     Event Date/Time   First MD Initiated Contact with Patient 03/06/23 1901     (approximate)   History   Fall   HPI  Sarah Potts is a 89 y.o. female with a history of HTN, DM, CHF, HLD and asthma presents to the ED via EMS for evaluation following a fall.  Patient reports she was stepping off of a curb at [employer] prior to arrival when she fell forward onto her knees and face.  Patient did hit her forehead on the pavement.  Denies LOC, dizziness and vomiting.  Endorses anticoagulation use.  Denies headache, chest pain or shortness of breath.     Physical Exam   Triage Vital Signs: ED Triage Vitals  Encounter Vitals Group     BP 03/06/23 1849 (!) 169/52     Systolic BP Percentile --      Diastolic BP Percentile --      Pulse Rate 03/06/23 1849 60     Resp 03/06/23 1849 18     Temp --      Temp src --      SpO2 03/06/23 1849 97 %     Weight 03/06/23 1850 187 lb (84.8 kg)     Height 03/06/23 1850 5\' 2"  (1.575 m)     Head Circumference --      Peak Flow --      Pain Score 03/06/23 1850 6     Pain Loc --      Pain Education --      Exclude from Growth Chart --     Most recent vital signs: Vitals:   03/06/23 2000 03/06/23 2100  BP: (!) 145/60 (!) 142/69  Pulse: (!) 58 (!) 52  Resp: 18 18  SpO2: 98% 95%    General: Alert and oriented. INAD.  Skin:  Warm, dry and intact. No rashes or lesions noted.     Head:  NCAT.  Hematoma on right forehead.  Abrasion along hairline.   Eyes:  PERRLA. EOMI.  Neck:   No cervical spine tenderness to palpation.  CV:  Good peripheral perfusion. RRR.  RESP:  Normal effort. LCTAB. No retractions.  ABD:  No distention. Soft, Non tender.  BACK:  Spinous process is midline without deformity or tenderness. MSK:   Notable bruising on right knee with abrasions.  Limited range of motion due to pain. NEURO: Cranial nerves II-XII intact. No focal deficits.    ED Results / Procedures / Treatments   Labs (all labs ordered are listed, but only abnormal results are displayed) Labs Reviewed  COMPREHENSIVE METABOLIC PANEL - Abnormal; Notable for the following components:      Result Value   Sodium 132 (*)    Glucose, Bld 257 (*)    BUN 39 (*)    Creatinine, Ser 1.85 (*)    Calcium 8.6 (*)    AST 94 (*)    ALT 64 (*)    GFR, Estimated 26 (*)    All other components within normal limits  MAGNESIUM - Abnormal; Notable for the following components:   Magnesium 1.4 (*)    All other components within normal limits  TSH - Abnormal; Notable for the following components:   TSH 4.834 (*)    All other components within normal limits  CBC WITH DIFFERENTIAL/PLATELET - Abnormal; Notable for the following components:   RBC 3.61 (*)    Hemoglobin 11.5 (*)  HCT 35.3 (*)    All other components within normal limits  HEPARIN LEVEL (UNFRACTIONATED) - Abnormal; Notable for the following components:   Heparin Unfractionated >1.10 (*)    All other components within normal limits  COMPREHENSIVE METABOLIC PANEL  CBC  HEMOGLOBIN A1C   RADIOLOGY  I personally viewed and evaluated these images as part of my medical decision making, as well as reviewing the written report by the radiologist.  ED Provider Interpretation: Obvious tibial tuberosity fracture on right knee x-ray.  CT head and cervical are normal.  Bilateral hip appears normal.  Left knee appears normal.  DG Hips Bilat W or Wo Pelvis 3-4 Views  Result Date: 03/06/2023 CLINICAL DATA:  Fall EXAM: DG HIP (WITH OR WITHOUT PELVIS) 3-4V BILAT COMPARISON:  None Available. FINDINGS: SI joints are non widened. Pubic symphysis and rami appear intact. No fracture or malalignment. Mild bilateral hip degenerative change. IMPRESSION: No acute osseous abnormality. Electronically Signed   By: Jasmine Pang M.D.   On: 03/06/2023 20:29   DG Knee Complete 4 Views Left  Result Date: 03/06/2023 CLINICAL  DATA:  Fall with knee swelling and pain EXAM: LEFT KNEE - COMPLETE 4+ VIEW COMPARISON:  None Available. FINDINGS: No acute fracture or malalignment. Moderate tricompartment arthritis of the knee. Small knee effusion. Chondrocalcinosis. IMPRESSION: No acute osseous abnormality. Moderate tricompartment arthritis with small effusion. Chondrocalcinosis. Electronically Signed   By: Jasmine Pang M.D.   On: 03/06/2023 20:24   DG Knee Complete 4 Views Right  Result Date: 03/06/2023 CLINICAL DATA:  Fall EXAM: RIGHT KNEE - COMPLETE 4+ VIEW COMPARISON:  None Available. FINDINGS: Acute appearing avulsion at the tibial tuberosity with overlying copious soft tissue swelling. No dislocation. Small knee effusion. Probable calcified loose body at the posterior knee. Chondrocalcinosis. Moderate tricompartment arthritis. IMPRESSION: 1. Acute appearing avulsion at the tibial tuberosity with overlying soft tissue swelling. 2. Moderate tricompartment arthritis with chondrocalcinosis and probable calcified loose body at the posterior knee. Electronically Signed   By: Jasmine Pang M.D.   On: 03/06/2023 20:23   CT Head Wo Contrast  Result Date: 03/06/2023 CLINICAL DATA:  Trauma, fall, facial injury EXAM: CT HEAD WITHOUT CONTRAST CT MAXILLOFACIAL WITHOUT CONTRAST CT CERVICAL SPINE WITHOUT CONTRAST TECHNIQUE: Multidetector CT imaging of the head, cervical spine, and maxillofacial structures were performed using the standard protocol without intravenous contrast. Multiplanar CT image reconstructions of the cervical spine and maxillofacial structures were also generated. RADIATION DOSE REDUCTION: This exam was performed according to the departmental dose-optimization program which includes automated exposure control, adjustment of the mA and/or kV according to patient size and/or use of iterative reconstruction technique. COMPARISON:  12/30/2022 FINDINGS: CT HEAD FINDINGS Brain: No evidence of acute infarction, hemorrhage,  hydrocephalus, extra-axial collection or mass lesion/mass effect. Vascular: No hyperdense vessel or unexpected calcification. CT FACIAL BONES FINDINGS Skull: Normal. Negative for fracture or focal lesion. Facial bones: No displaced fractures or dislocations. Sinuses/Orbits: No acute finding. Other: Soft tissue contusion of the right brow (series 2, image 74). Patient is edentulous. CT CERVICAL SPINE FINDINGS Alignment: Degenerative straightening of the normal cervical lordosis. Skull base and vertebrae: No acute fracture. No primary bone lesion or focal pathologic process. Soft tissues and spinal canal: No prevertebral fluid or swelling. No visible canal hematoma. Disc levels: Moderate disc space height loss and osteophytosis, worst at C5-C7. Upper chest: Negative. Other: None. IMPRESSION: 1. No acute intracranial pathology. 2. No displaced fractures or dislocations of the facial bones. 3. Soft tissue contusion of the right brow.  4. No fracture or static subluxation of the cervical spine. 5. Moderate cervical disc degenerative disease. Electronically Signed   By: Jearld Lesch M.D.   On: 03/06/2023 20:22   CT Cervical Spine Wo Contrast  Result Date: 03/06/2023 CLINICAL DATA:  Trauma, fall, facial injury EXAM: CT HEAD WITHOUT CONTRAST CT MAXILLOFACIAL WITHOUT CONTRAST CT CERVICAL SPINE WITHOUT CONTRAST TECHNIQUE: Multidetector CT imaging of the head, cervical spine, and maxillofacial structures were performed using the standard protocol without intravenous contrast. Multiplanar CT image reconstructions of the cervical spine and maxillofacial structures were also generated. RADIATION DOSE REDUCTION: This exam was performed according to the departmental dose-optimization program which includes automated exposure control, adjustment of the mA and/or kV according to patient size and/or use of iterative reconstruction technique. COMPARISON:  12/30/2022 FINDINGS: CT HEAD FINDINGS Brain: No evidence of acute  infarction, hemorrhage, hydrocephalus, extra-axial collection or mass lesion/mass effect. Vascular: No hyperdense vessel or unexpected calcification. CT FACIAL BONES FINDINGS Skull: Normal. Negative for fracture or focal lesion. Facial bones: No displaced fractures or dislocations. Sinuses/Orbits: No acute finding. Other: Soft tissue contusion of the right brow (series 2, image 74). Patient is edentulous. CT CERVICAL SPINE FINDINGS Alignment: Degenerative straightening of the normal cervical lordosis. Skull base and vertebrae: No acute fracture. No primary bone lesion or focal pathologic process. Soft tissues and spinal canal: No prevertebral fluid or swelling. No visible canal hematoma. Disc levels: Moderate disc space height loss and osteophytosis, worst at C5-C7. Upper chest: Negative. Other: None. IMPRESSION: 1. No acute intracranial pathology. 2. No displaced fractures or dislocations of the facial bones. 3. Soft tissue contusion of the right brow. 4. No fracture or static subluxation of the cervical spine. 5. Moderate cervical disc degenerative disease. Electronically Signed   By: Jearld Lesch M.D.   On: 03/06/2023 20:22   CT Maxillofacial Wo Contrast  Result Date: 03/06/2023 CLINICAL DATA:  Trauma, fall, facial injury EXAM: CT HEAD WITHOUT CONTRAST CT MAXILLOFACIAL WITHOUT CONTRAST CT CERVICAL SPINE WITHOUT CONTRAST TECHNIQUE: Multidetector CT imaging of the head, cervical spine, and maxillofacial structures were performed using the standard protocol without intravenous contrast. Multiplanar CT image reconstructions of the cervical spine and maxillofacial structures were also generated. RADIATION DOSE REDUCTION: This exam was performed according to the departmental dose-optimization program which includes automated exposure control, adjustment of the mA and/or kV according to patient size and/or use of iterative reconstruction technique. COMPARISON:  12/30/2022 FINDINGS: CT HEAD FINDINGS Brain: No  evidence of acute infarction, hemorrhage, hydrocephalus, extra-axial collection or mass lesion/mass effect. Vascular: No hyperdense vessel or unexpected calcification. CT FACIAL BONES FINDINGS Skull: Normal. Negative for fracture or focal lesion. Facial bones: No displaced fractures or dislocations. Sinuses/Orbits: No acute finding. Other: Soft tissue contusion of the right brow (series 2, image 74). Patient is edentulous. CT CERVICAL SPINE FINDINGS Alignment: Degenerative straightening of the normal cervical lordosis. Skull base and vertebrae: No acute fracture. No primary bone lesion or focal pathologic process. Soft tissues and spinal canal: No prevertebral fluid or swelling. No visible canal hematoma. Disc levels: Moderate disc space height loss and osteophytosis, worst at C5-C7. Upper chest: Negative. Other: None. IMPRESSION: 1. No acute intracranial pathology. 2. No displaced fractures or dislocations of the facial bones. 3. Soft tissue contusion of the right brow. 4. No fracture or static subluxation of the cervical spine. 5. Moderate cervical disc degenerative disease. Electronically Signed   By: Jearld Lesch M.D.   On: 03/06/2023 20:22    PROCEDURES:  Critical Care performed: No  Procedures  MEDICATIONS ORDERED IN ED: Medications  sodium chloride flush (NS) 0.9 % injection 3 mL (has no administration in time range)  acetaminophen (TYLENOL) tablet 650 mg (has no administration in time range)    Or  acetaminophen (TYLENOL) suppository 650 mg (has no administration in time range)  HYDROcodone-acetaminophen (NORCO/VICODIN) 5-325 MG per tablet 1 tablet (has no administration in time range)  morphine (PF) 2 MG/ML injection 2 mg (has no administration in time range)  lactated ringers infusion (has no administration in time range)  hydrALAZINE (APRESOLINE) injection 10 mg (has no administration in time range)  acetaminophen (TYLENOL) tablet 650 mg (650 mg Oral Given 03/06/23 1933)      IMPRESSION / MDM / ASSESSMENT AND PLAN / ED COURSE  I reviewed the triage vital signs and the nursing notes.                                 89 y.o. female presents to the emergency department for evaluation and treatment following a fall. See HPI for further details.   Differential diagnosis includes, but is not limited to ICH, hematoma, fracture  Patient's presentation is most consistent with acute complicated illness / injury requiring diagnostic workup.  Patient is alert and oriented.  He is hemodynamically stable.  Physical exam findings are as stated above.  CT head, cervical, x-rays of hip and left knee are reassuring.  Right knee x-ray reveals an avulsion fracture of tibial tuberosity.  ----------------------------------------- 9:27 PM on 03/06/2023 -----------------------------------------  Dr. Hyacinth Meeker of orthopedics consulted. recommendation for admission given baseline gait with a walker and that she lives alone she will not be able to safely receive help at her home.   Patient will be admitted under care of Dr. Renaldo Reel.  At this time patient is in stable and satisfactory condition.   FINAL CLINICAL IMPRESSION(S) / ED DIAGNOSES   Final diagnoses:  Fall, initial encounter  Avulsion fracture of tibial tuberosity    Rx / DC Orders   ED Discharge Orders     None        Note:  This document was prepared using Dragon voice recognition software and may include unintentional dictation errors.    Romeo Apple, Farran Amsden A, PA-C 03/06/23 2333    Pilar Jarvis, MD 03/12/23 [phone]

## 2023-03-06 NOTE — ED Notes (Signed)
Informed RN Feliz Beam via chat/ pt has bed assigned (138A)

## 2023-03-06 NOTE — ED Notes (Signed)
Request for transport submitted.

## 2023-03-06 NOTE — ED Triage Notes (Signed)
Pt arrives to ED from [employer] parking lot via [facility] with c/c of witnessed fall. Pt fell forward while stepping off curb landing on knees and hitting right side of forehead on pavement. Pt denies LoC. Large contusion evident on anterior right knee and right forehead just above the eye. Pt A&Ox4, NAD. Pt states she is taking blood thinner Xarelto.

## 2023-03-07 DIAGNOSIS — I480 Paroxysmal atrial fibrillation: Secondary | ICD-10-CM | POA: Diagnosis present

## 2023-03-07 DIAGNOSIS — W19XXXA Unspecified fall, initial encounter: Secondary | ICD-10-CM | POA: Diagnosis not present

## 2023-03-07 DIAGNOSIS — N179 Acute kidney failure, unspecified: Secondary | ICD-10-CM | POA: Diagnosis not present

## 2023-03-07 DIAGNOSIS — Z7984 Long term (current) use of oral hypoglycemic drugs: Secondary | ICD-10-CM | POA: Diagnosis not present

## 2023-03-07 DIAGNOSIS — E1122 Type 2 diabetes mellitus with diabetic chronic kidney disease: Secondary | ICD-10-CM | POA: Diagnosis present

## 2023-03-07 DIAGNOSIS — E785 Hyperlipidemia, unspecified: Secondary | ICD-10-CM | POA: Diagnosis present

## 2023-03-07 DIAGNOSIS — I2510 Atherosclerotic heart disease of native coronary artery without angina pectoris: Secondary | ICD-10-CM | POA: Diagnosis present

## 2023-03-07 DIAGNOSIS — E669 Obesity, unspecified: Secondary | ICD-10-CM | POA: Diagnosis present

## 2023-03-07 DIAGNOSIS — S82153A Displaced fracture of unspecified tibial tuberosity, initial encounter for closed fracture: Secondary | ICD-10-CM | POA: Diagnosis present

## 2023-03-07 DIAGNOSIS — Z933 Colostomy status: Secondary | ICD-10-CM | POA: Diagnosis not present

## 2023-03-07 DIAGNOSIS — I130 Hypertensive heart and chronic kidney disease with heart failure and stage 1 through stage 4 chronic kidney disease, or unspecified chronic kidney disease: Secondary | ICD-10-CM | POA: Diagnosis present

## 2023-03-07 DIAGNOSIS — I10 Essential (primary) hypertension: Secondary | ICD-10-CM | POA: Diagnosis not present

## 2023-03-07 DIAGNOSIS — S80212A Abrasion, left knee, initial encounter: Secondary | ICD-10-CM | POA: Diagnosis present

## 2023-03-07 DIAGNOSIS — Y9289 Other specified places as the place of occurrence of the external cause: Secondary | ICD-10-CM | POA: Diagnosis not present

## 2023-03-07 DIAGNOSIS — Z8249 Family history of ischemic heart disease and other diseases of the circulatory system: Secondary | ICD-10-CM | POA: Diagnosis not present

## 2023-03-07 DIAGNOSIS — I482 Chronic atrial fibrillation, unspecified: Secondary | ICD-10-CM | POA: Diagnosis not present

## 2023-03-07 DIAGNOSIS — S80211A Abrasion, right knee, initial encounter: Secondary | ICD-10-CM | POA: Diagnosis present

## 2023-03-07 DIAGNOSIS — Z85048 Personal history of other malignant neoplasm of rectum, rectosigmoid junction, and anus: Secondary | ICD-10-CM | POA: Diagnosis not present

## 2023-03-07 DIAGNOSIS — W1789XA Other fall from one level to another, initial encounter: Secondary | ICD-10-CM | POA: Diagnosis present

## 2023-03-07 DIAGNOSIS — Z7901 Long term (current) use of anticoagulants: Secondary | ICD-10-CM | POA: Diagnosis not present

## 2023-03-07 DIAGNOSIS — M11261 Other chondrocalcinosis, right knee: Secondary | ICD-10-CM | POA: Diagnosis present

## 2023-03-07 DIAGNOSIS — E875 Hyperkalemia: Secondary | ICD-10-CM | POA: Diagnosis present

## 2023-03-07 DIAGNOSIS — F32A Depression, unspecified: Secondary | ICD-10-CM | POA: Diagnosis present

## 2023-03-07 DIAGNOSIS — N1831 Chronic kidney disease, stage 3a: Secondary | ICD-10-CM | POA: Diagnosis not present

## 2023-03-07 DIAGNOSIS — I25118 Atherosclerotic heart disease of native coronary artery with other forms of angina pectoris: Secondary | ICD-10-CM | POA: Diagnosis not present

## 2023-03-07 DIAGNOSIS — N1832 Chronic kidney disease, stage 3b: Secondary | ICD-10-CM | POA: Diagnosis present

## 2023-03-07 DIAGNOSIS — J45909 Unspecified asthma, uncomplicated: Secondary | ICD-10-CM | POA: Diagnosis present

## 2023-03-07 DIAGNOSIS — S82154A Nondisplaced fracture of right tibial tuberosity, initial encounter for closed fracture: Secondary | ICD-10-CM | POA: Diagnosis present

## 2023-03-07 DIAGNOSIS — E1165 Type 2 diabetes mellitus with hyperglycemia: Secondary | ICD-10-CM | POA: Diagnosis present

## 2023-03-07 DIAGNOSIS — M11262 Other chondrocalcinosis, left knee: Secondary | ICD-10-CM | POA: Diagnosis present

## 2023-03-07 DIAGNOSIS — I5022 Chronic systolic (congestive) heart failure: Secondary | ICD-10-CM | POA: Diagnosis present

## 2023-03-07 DIAGNOSIS — E1129 Type 2 diabetes mellitus with other diabetic kidney complication: Secondary | ICD-10-CM | POA: Diagnosis not present

## 2023-03-07 DIAGNOSIS — D631 Anemia in chronic kidney disease: Secondary | ICD-10-CM | POA: Diagnosis present

## 2023-03-07 LAB — COMPREHENSIVE METABOLIC PANEL
ALT: 58 U/L — ABNORMAL HIGH (ref 0–44)
AST: 80 U/L — ABNORMAL HIGH (ref 15–41)
Albumin: 3.4 g/dL — ABNORMAL LOW (ref 3.5–5.0)
Alkaline Phosphatase: 62 U/L (ref 38–126)
Anion gap: 9 (ref 5–15)
BUN: 40 mg/dL — ABNORMAL HIGH (ref 8–23)
CO2: 22 mmol/L (ref 22–32)
Calcium: 8.5 mg/dL — ABNORMAL LOW (ref 8.9–10.3)
Chloride: 104 mmol/L (ref 98–111)
Creatinine, Ser: 1.55 mg/dL — ABNORMAL HIGH (ref 0.44–1.00)
GFR, Estimated: 32 mL/min — ABNORMAL LOW (ref >60)
Glucose, Bld: 197 mg/dL — ABNORMAL HIGH (ref 70–99)
Potassium: 4.2 mmol/L (ref 3.5–5.1)
Sodium: 135 mmol/L (ref 135–145)
Total Bilirubin: 0.7 mg/dL (ref <1.2)
Total Protein: 6.6 g/dL (ref 6.5–8.1)

## 2023-03-07 LAB — GLUCOSE, CAPILLARY
Glucose-Capillary: 182 mg/dL — ABNORMAL HIGH (ref 70–99)
Glucose-Capillary: 194 mg/dL — ABNORMAL HIGH (ref 70–99)
Glucose-Capillary: 215 mg/dL — ABNORMAL HIGH (ref 70–99)

## 2023-03-07 LAB — CK: Total CK: 32 U/L — ABNORMAL LOW (ref 38–234)

## 2023-03-07 LAB — CBC
HCT: 33.1 % — ABNORMAL LOW (ref 36.0–46.0)
Hemoglobin: 11.1 g/dL — ABNORMAL LOW (ref 12.0–15.0)
MCH: 32 pg (ref 26.0–34.0)
MCHC: 33.5 g/dL (ref 30.0–36.0)
MCV: 95.4 fL (ref 80.0–100.0)
Platelets: 253 10*3/uL (ref 150–400)
RBC: 3.47 MIL/uL — ABNORMAL LOW (ref 3.87–5.11)
RDW: 13.7 % (ref 11.5–15.5)
WBC: 7 10*3/uL (ref 4.0–10.5)
nRBC: 0 % (ref 0.0–0.2)

## 2023-03-07 LAB — HEMOGLOBIN A1C
Hgb A1c MFr Bld: 7.8 % — ABNORMAL HIGH (ref 4.8–5.6)
Mean Plasma Glucose: 177.16 mg/dL

## 2023-03-07 LAB — T4, FREE: Free T4: 0.93 ng/dL (ref 0.61–1.12)

## 2023-03-07 MED ORDER — RIVAROXABAN 15 MG PO TABS
15.0000 mg | ORAL_TABLET | Freq: Every day | ORAL | Status: DC
  Administered 2023-03-07 – 2023-03-12 (×6): 15 mg via ORAL
  Filled 2023-03-07 (×7): qty 1

## 2023-03-07 MED ORDER — PANTOPRAZOLE SODIUM 40 MG PO TBEC
40.0000 mg | DELAYED_RELEASE_TABLET | Freq: Every day | ORAL | Status: DC
  Administered 2023-03-07 – 2023-03-13 (×7): 40 mg via ORAL
  Filled 2023-03-07 (×7): qty 1

## 2023-03-07 MED ORDER — MORPHINE SULFATE (PF) 2 MG/ML IV SOLN
2.0000 mg | INTRAVENOUS | Status: DC | PRN
  Administered 2023-03-07: 2 mg via INTRAVENOUS
  Filled 2023-03-07 (×2): qty 1

## 2023-03-07 MED ORDER — INSULIN ASPART 100 UNIT/ML IJ SOLN
0.0000 [IU] | Freq: Three times a day (TID) | INTRAMUSCULAR | Status: DC
  Administered 2023-03-07: 3 [IU] via SUBCUTANEOUS
  Administered 2023-03-07: 5 [IU] via SUBCUTANEOUS
  Administered 2023-03-08: 8 [IU] via SUBCUTANEOUS
  Administered 2023-03-08 (×2): 3 [IU] via SUBCUTANEOUS
  Administered 2023-03-09: 5 [IU] via SUBCUTANEOUS
  Administered 2023-03-09: 3 [IU] via SUBCUTANEOUS
  Administered 2023-03-09: 5 [IU] via SUBCUTANEOUS
  Administered 2023-03-10: 3 [IU] via SUBCUTANEOUS
  Administered 2023-03-10: 5 [IU] via SUBCUTANEOUS
  Administered 2023-03-10 – 2023-03-12 (×5): 3 [IU] via SUBCUTANEOUS
  Administered 2023-03-12: 5 [IU] via SUBCUTANEOUS
  Administered 2023-03-12 – 2023-03-13 (×2): 3 [IU] via SUBCUTANEOUS
  Administered 2023-03-13: 5 [IU] via SUBCUTANEOUS
  Filled 2023-03-07 (×17): qty 1

## 2023-03-07 MED ORDER — ATORVASTATIN CALCIUM 20 MG PO TABS
40.0000 mg | ORAL_TABLET | Freq: Every morning | ORAL | Status: DC
  Administered 2023-03-08 – 2023-03-11 (×4): 40 mg via ORAL
  Filled 2023-03-07 (×4): qty 2

## 2023-03-07 MED ORDER — ACETAMINOPHEN 500 MG PO TABS: 500.0000 mg | ORAL_TABLET | ORAL | Status: DC | PRN

## 2023-03-07 MED ORDER — NITROGLYCERIN 0.4 MG SL SUBL: 0.4000 mg | SUBLINGUAL_TABLET | SUBLINGUAL | Status: DC | PRN

## 2023-03-07 MED ORDER — LOSARTAN POTASSIUM 50 MG PO TABS
100.0000 mg | ORAL_TABLET | Freq: Every day | ORAL | Status: DC
  Administered 2023-03-07 – 2023-03-09 (×3): 100 mg via ORAL
  Filled 2023-03-07 (×3): qty 2

## 2023-03-07 MED ORDER — MAGNESIUM OXIDE 400 MG PO TABS
400.0000 mg | ORAL_TABLET | Freq: Every day | ORAL | Status: DC
  Administered 2023-03-08 – 2023-03-13 (×6): 400 mg via ORAL
  Filled 2023-03-07 (×11): qty 1

## 2023-03-07 MED ORDER — EZETIMIBE 10 MG PO TABS
10.0000 mg | ORAL_TABLET | Freq: Every morning | ORAL | Status: DC
  Administered 2023-03-08 – 2023-03-13 (×6): 10 mg via ORAL
  Filled 2023-03-07 (×6): qty 1

## 2023-03-07 MED ORDER — AMIODARONE HCL 200 MG PO TABS
200.0000 mg | ORAL_TABLET | Freq: Every day | ORAL | Status: DC
  Administered 2023-03-07 – 2023-03-13 (×6): 200 mg via ORAL
  Filled 2023-03-07 (×7): qty 1

## 2023-03-07 MED ORDER — SERTRALINE HCL 50 MG PO TABS
50.0000 mg | ORAL_TABLET | Freq: Every day | ORAL | Status: DC
  Administered 2023-03-07 – 2023-03-13 (×7): 50 mg via ORAL
  Filled 2023-03-07 (×7): qty 1

## 2023-03-07 MED ORDER — ALBUTEROL SULFATE (2.5 MG/3ML) 0.083% IN NEBU: 2.5000 mg | INHALATION_SOLUTION | RESPIRATORY_TRACT | Status: DC | PRN

## 2023-03-07 MED ORDER — METOPROLOL SUCCINATE ER 50 MG PO TB24
50.0000 mg | ORAL_TABLET | Freq: Every day | ORAL | Status: DC
  Administered 2023-03-07 – 2023-03-13 (×6): 50 mg via ORAL
  Filled 2023-03-07 (×7): qty 1

## 2023-03-07 MED ORDER — OMEGA-3-ACID ETHYL ESTERS 1 G PO CAPS
2.0000 g | ORAL_CAPSULE | Freq: Two times a day (BID) | ORAL | Status: DC
  Administered 2023-03-07 – 2023-03-13 (×12): 2 g via ORAL
  Filled 2023-03-07 (×12): qty 2

## 2023-03-07 NOTE — Consult Note (Signed)
ORTHOPAEDIC CONSULTATION  REQUESTING PHYSICIAN: Lucile Shutters, MD  Chief Complaint: Right knee pain  HPI: Sarah Potts is a 89 y.o. female who complains of right knee pain after a fall last night coming out of the [employer].  The patient landed on both knees and the side of her right face.  She was brought to the emergency room where exam and x-rays revealed a nondisplaced fracture of the tibial tubercle of the right knee.  There were abrasions on the right knee anteriorly.  The left knee had no fractures.  Patient lives alone.  She was unable to manage ambulation by herself and is deemed unable to return home independently at this time.  She will need skilled nursing placement.  PT will be enlisted.  Her son is present and agreed with treatment plans.  The abrasion over the right knee was cleansed thoroughly and redressed sterilely.  The knee immobilizer was replaced properly.  Past Medical History:  Diagnosis Date   Anal cancer (HCC)    Anemia    Asthma    CHF (congestive heart failure) (HCC)    Diabetes mellitus    Hyperlipidemia    Hypertension    Past Surgical History:  Procedure Laterality Date   BLADDER SURGERY     CARDIAC CATHETERIZATION     COLOSTOMY     CORONARY ANGIOPLASTY WITH STENT PLACEMENT  03/25/2018   Stent to the LCX  2.50 mm x 16mm  [employer] REF [id number]   heart stent     LEFT HEART CATH AND CORONARY ANGIOGRAPHY N/A 12/23/2021   Procedure: LEFT HEART CATH AND CORONARY ANGIOGRAPHY;  Surgeon: Iran Ouch, MD;  Location: [facility] INVASIVE CV LAB;  Service: Cardiovascular;  Laterality: N/A;   TONSILLECTOMY     uterus surgery     removed   Social History   Socioeconomic History   Marital status: Single    Spouse name: Not on file   Number of children: Not on file   Years of education: Not on file   Highest education level: Not on file  Occupational History   Not on file  Tobacco Use   Smoking status:  Never   Smokeless tobacco: Never  Vaping Use   Vaping status: Never Used  Substance and Sexual Activity   Alcohol use: No   Drug use: No   Sexual activity: Not on file  Other Topics Concern   Not on file  Social History Narrative   Not on file   Social Determinants of Health   Financial Resource Strain: Not on file  Food Insecurity: No Food Insecurity (03/07/2023)   Hunger Vital Sign    Worried About Running Out of Food in the Last Year: Never true    Ran Out of Food in the Last Year: Never true  Transportation Needs: No Transportation Needs (03/07/2023)   PRAPARE - Administrator, Civil Service (Medical): No    Lack of Transportation (Non-Medical): No  Physical Activity: Not on file  Stress: Not on file  Social Connections: Not on file   Family History  Problem Relation Age of Onset   Cancer Mother    Breast cancer Mother    Heart disease Father    Heart attack Father    Cancer Sister    Heart disease Sister    Cervical cancer Sister    Heart disease Brother    Allergies  Allergen Reactions   Levofloxacin Nausea Only  Patient reports that she thinks that she could take this medication.  Patient reports that she thinks that she could take this medication.     Patient reports that she thinks that she could take this medication.   Niacin Other (See Comments)    Hot, Flushed  Pt states she get real red and hot.    Hot, Flushed   Niacin And Related Other (See Comments)    Pt states she get real red and hot.   Prior to Admission medications   Medication Sig Start Date End Date Taking? Authorizing Provider  acetaminophen (TYLENOL) 500 MG tablet Take 500 mg by mouth every 4 (four) hours as needed.    [provider]  albuterol (VENTOLIN HFA) 108 (90 Base) MCG/ACT inhaler Inhale 1 puff into the lungs every 4 (four) hours as needed. 11/29/22   [provider]  amiodarone (PACERONE) 200 MG tablet Take 1 tablet (200 mg total) by mouth  daily. 07/08/22   Antonieta Iba, MD  atorvastatin (LIPITOR) 40 MG tablet Take 1 tablet (40 mg total) by mouth in the morning. For cholesterol 01/09/23   Antonieta Iba, MD  Cholecalciferol 25 MCG (1000 UT) tablet Take by mouth. Pt takes 3 daily    [provider]  Citalopram Hydrobromide (CELEXA PO) daily.    [provider]  Cyanocobalamin 1000 MCG/ML KIT Inject into the muscle every 30 (thirty) days. 06/05/22   [provider]  ezetimibe (ZETIA) 10 MG tablet TAKE 1 TABLET (10 MG TOTAL) BY MOUTH IN THE MORNING. FOR CHOLESTEROL 12/29/22   Antonieta Iba, MD  fexofenadine (ALLEGRA) 60 MG tablet Take 60 mg by mouth daily. 12/18/22 12/18/23  [provider]  losartan (COZAAR) 100 MG tablet Take 1 tablet (100 mg total) by mouth daily. 07/08/22   Antonieta Iba, MD  magnesium oxide (MAG-OX) 400 MG tablet Take 400 mg by mouth daily.    [provider]  meclizine (ANTIVERT) 25 MG tablet Take 25 mg by mouth 2 (two) times daily as needed.    [provider]  metoprolol succinate (TOPROL-XL) 50 MG 24 hr tablet Take 1 tablet (50 mg total) by mouth daily. 07/08/22   Antonieta Iba, MD  mirabegron ER (MYRBETRIQ) 50 MG TB24 tablet Take by mouth. Patient not taking: Reported on 07/08/2022    [provider]  montelukast (SINGULAIR) 10 MG tablet Take 10 mg by mouth at bedtime.      [provider]  nitroGLYCERIN (NITROSTAT) 0.4 MG SL tablet Place 1 tablet (0.4 mg total) under the tongue every 5 (five) minutes as needed for chest pain. 12/24/21   Enedina Finner, MD  nystatin (MYCOSTATIN/NYSTOP) powder Apply topically 2 (two) times daily. 07/07/22   [provider]  omega-3 acid ethyl esters (LOVAZA) 1 g capsule Take 2 g by mouth 2 (two) times daily.      [provider]  Omega-3 Fatty Acids (FISH OIL) 1000 MG CAPS Take 1,000 mg by mouth daily.    [provider]  omeprazole (PRILOSEC) 40 MG capsule Take 40 mg by mouth  daily.      [provider]  Omeprazole Magnesium (PRILOSEC PO) daily.    [provider]  pioglitazone (ACTOS) 30 MG tablet Take 30 mg by mouth daily. 12/18/22 12/18/23  [provider]  pregabalin (LYRICA) 150 MG capsule Take 150 mg by mouth 2 (two) times daily. 12/18/21   [provider]  sertraline (ZOLOFT) 50 MG tablet Take 50  mg by mouth daily. 08/12/22   [provider]  TRULICITY 1.5 MG/0.5ML SOPN Inject into the skin once a week. 12/16/20   [provider]  XARELTO 15 MG TABS tablet TAKE 1 TABLET (15 MG TOTAL) BY MOUTH DAILY. 01/16/23   Antonieta Iba, MD   DG Hips Bilat W or Wo Pelvis 3-4 Views  Result Date: 03/06/2023 CLINICAL DATA:  Fall EXAM: DG HIP (WITH OR WITHOUT PELVIS) 3-4V BILAT COMPARISON:  None Available. FINDINGS: SI joints are non widened. Pubic symphysis and rami appear intact. No fracture or malalignment. Mild bilateral hip degenerative change. IMPRESSION: No acute osseous abnormality. Electronically Signed   By: Jasmine Pang M.D.   On: 03/06/2023 20:29   DG Knee Complete 4 Views Left  Result Date: 03/06/2023 CLINICAL DATA:  Fall with knee swelling and pain EXAM: LEFT KNEE - COMPLETE 4+ VIEW COMPARISON:  None Available. FINDINGS: No acute fracture or malalignment. Moderate tricompartment arthritis of the knee. Small knee effusion. Chondrocalcinosis. IMPRESSION: No acute osseous abnormality. Moderate tricompartment arthritis with small effusion. Chondrocalcinosis. Electronically Signed   By: Jasmine Pang M.D.   On: 03/06/2023 20:24   DG Knee Complete 4 Views Right  Result Date: 03/06/2023 CLINICAL DATA:  Fall EXAM: RIGHT KNEE - COMPLETE 4+ VIEW COMPARISON:  None Available. FINDINGS: Acute appearing avulsion at the tibial tuberosity with overlying copious soft tissue swelling. No dislocation. Small knee effusion. Probable calcified loose body at the posterior knee. Chondrocalcinosis. Moderate tricompartment arthritis.  IMPRESSION: 1. Acute appearing avulsion at the tibial tuberosity with overlying soft tissue swelling. 2. Moderate tricompartment arthritis with chondrocalcinosis and probable calcified loose body at the posterior knee. Electronically Signed   By: Jasmine Pang M.D.   On: 03/06/2023 20:23   CT Head Wo Contrast  Result Date: 03/06/2023 CLINICAL DATA:  Trauma, fall, facial injury EXAM: CT HEAD WITHOUT CONTRAST CT MAXILLOFACIAL WITHOUT CONTRAST CT CERVICAL SPINE WITHOUT CONTRAST TECHNIQUE: Multidetector CT imaging of the head, cervical spine, and maxillofacial structures were performed using the standard protocol without intravenous contrast. Multiplanar CT image reconstructions of the cervical spine and maxillofacial structures were also generated. RADIATION DOSE REDUCTION: This exam was performed according to the departmental dose-optimization program which includes automated exposure control, adjustment of the mA and/or kV according to patient size and/or use of iterative reconstruction technique. COMPARISON:  12/30/2022 FINDINGS: CT HEAD FINDINGS Brain: No evidence of acute infarction, hemorrhage, hydrocephalus, extra-axial collection or mass lesion/mass effect. Vascular: No hyperdense vessel or unexpected calcification. CT FACIAL BONES FINDINGS Skull: Normal. Negative for fracture or focal lesion. Facial bones: No displaced fractures or dislocations. Sinuses/Orbits: No acute finding. Other: Soft tissue contusion of the right brow (series 2, image 74). Patient is edentulous. CT CERVICAL SPINE FINDINGS Alignment: Degenerative straightening of the normal cervical lordosis. Skull base and vertebrae: No acute fracture. No primary bone lesion or focal pathologic process. Soft tissues and spinal canal: No prevertebral fluid or swelling. No visible canal hematoma. Disc levels: Moderate disc space height loss and osteophytosis, worst at C5-C7. Upper chest: Negative. Other: None. IMPRESSION: 1. No acute intracranial  pathology. 2. No displaced fractures or dislocations of the facial bones. 3. Soft tissue contusion of the right brow. 4. No fracture or static subluxation of the cervical spine. 5. Moderate cervical disc degenerative disease. Electronically Signed   By: Jearld Lesch M.D.   On: 03/06/2023 20:22   CT Cervical Spine Wo Contrast  Result Date: 03/06/2023 CLINICAL DATA:  Trauma, fall, facial injury EXAM: CT HEAD WITHOUT CONTRAST  CT MAXILLOFACIAL WITHOUT CONTRAST CT CERVICAL SPINE WITHOUT CONTRAST TECHNIQUE: Multidetector CT imaging of the head, cervical spine, and maxillofacial structures were performed using the standard protocol without intravenous contrast. Multiplanar CT image reconstructions of the cervical spine and maxillofacial structures were also generated. RADIATION DOSE REDUCTION: This exam was performed according to the departmental dose-optimization program which includes automated exposure control, adjustment of the mA and/or kV according to patient size and/or use of iterative reconstruction technique. COMPARISON:  12/30/2022 FINDINGS: CT HEAD FINDINGS Brain: No evidence of acute infarction, hemorrhage, hydrocephalus, extra-axial collection or mass lesion/mass effect. Vascular: No hyperdense vessel or unexpected calcification. CT FACIAL BONES FINDINGS Skull: Normal. Negative for fracture or focal lesion. Facial bones: No displaced fractures or dislocations. Sinuses/Orbits: No acute finding. Other: Soft tissue contusion of the right brow (series 2, image 74). Patient is edentulous. CT CERVICAL SPINE FINDINGS Alignment: Degenerative straightening of the normal cervical lordosis. Skull base and vertebrae: No acute fracture. No primary bone lesion or focal pathologic process. Soft tissues and spinal canal: No prevertebral fluid or swelling. No visible canal hematoma. Disc levels: Moderate disc space height loss and osteophytosis, worst at C5-C7. Upper chest: Negative. Other: None. IMPRESSION: 1. No acute  intracranial pathology. 2. No displaced fractures or dislocations of the facial bones. 3. Soft tissue contusion of the right brow. 4. No fracture or static subluxation of the cervical spine. 5. Moderate cervical disc degenerative disease. Electronically Signed   By: Jearld Lesch M.D.   On: 03/06/2023 20:22   CT Maxillofacial Wo Contrast  Result Date: 03/06/2023 CLINICAL DATA:  Trauma, fall, facial injury EXAM: CT HEAD WITHOUT CONTRAST CT MAXILLOFACIAL WITHOUT CONTRAST CT CERVICAL SPINE WITHOUT CONTRAST TECHNIQUE: Multidetector CT imaging of the head, cervical spine, and maxillofacial structures were performed using the standard protocol without intravenous contrast. Multiplanar CT image reconstructions of the cervical spine and maxillofacial structures were also generated. RADIATION DOSE REDUCTION: This exam was performed according to the departmental dose-optimization program which includes automated exposure control, adjustment of the mA and/or kV according to patient size and/or use of iterative reconstruction technique. COMPARISON:  12/30/2022 FINDINGS: CT HEAD FINDINGS Brain: No evidence of acute infarction, hemorrhage, hydrocephalus, extra-axial collection or mass lesion/mass effect. Vascular: No hyperdense vessel or unexpected calcification. CT FACIAL BONES FINDINGS Skull: Normal. Negative for fracture or focal lesion. Facial bones: No displaced fractures or dislocations. Sinuses/Orbits: No acute finding. Other: Soft tissue contusion of the right brow (series 2, image 74). Patient is edentulous. CT CERVICAL SPINE FINDINGS Alignment: Degenerative straightening of the normal cervical lordosis. Skull base and vertebrae: No acute fracture. No primary bone lesion or focal pathologic process. Soft tissues and spinal canal: No prevertebral fluid or swelling. No visible canal hematoma. Disc levels: Moderate disc space height loss and osteophytosis, worst at C5-C7. Upper chest: Negative. Other: None. IMPRESSION:  1. No acute intracranial pathology. 2. No displaced fractures or dislocations of the facial bones. 3. Soft tissue contusion of the right brow. 4. No fracture or static subluxation of the cervical spine. 5. Moderate cervical disc degenerative disease. Electronically Signed   By: Jearld Lesch M.D.   On: 03/06/2023 20:22    Positive ROS: All other systems have been reviewed and were otherwise negative with the exception of those mentioned in the HPI and as above.  Physical Exam: General: Alert, no acute distress Cardiovascular: No pedal edema Respiratory: No cyanosis, no use of accessory musculature GI: No organomegaly, abdomen is soft and non-tender Skin: No lesions in the area of chief complaint  Neurologic: Sensation intact distally Psychiatric: Patient is competent for consent with normal mood and affect Lymphatic: No axillary or cervical lymphadenopathy  MUSCULOSKELETAL: Patient is alert cooperative and oriented.  Right knee was exposed and some mild abrasion noted.  There are some swelling and bruising.  She is unable to do a straight leg raise at this time.  Neurovascular status good.  The left knee had a very minimal abrasion.  Skin is intact intact otherwise.  The hips have good motion.  Assessment: Nondisplaced fracture right tibial tubercle with bilateral knee abrasions  Plan: Right knee wound was cleansed and dressed sterilely. Knee immobilizer was replaced properly. She may start PT weightbearing as tolerated with a walker on the right. She will need skilled nursing placement. Follow-up in my office in 2 weeks.    Valinda Hoar, MD [phone]   03/07/2023 11:43 AM

## 2023-03-07 NOTE — Evaluation (Signed)
Physical Therapy Evaluation Patient Details Name: Sarah Potts MRN: [id number] DOB: Jan 22, 1933 Today's Date: 03/07/2023  History of Present Illness  Pt is 89 y/o admitted 03/06/23 for a fall. As a result of the fall, pt endured a non-displaced fx of the right tibial tubercle. PmHx includes: CAD, asthma, chronic AFib, and stage 3a CKD.   Clinical Impression  Pt received in bed and agreed to PT session. Pt reports pain to be on a scale of 8/10 in Left knee, 6/10 in Right knee, and 10/10 in back. Pt performed supine>sit MinA with HOB elevated and bed rails for BLE management and 4xSTS on an elevated surface with the use of RW (2wheels) ModA+2. 2/4xSTS were unsuccessful due to pt experiencing bilat knee pain with +1 assist. With +2 assist, pt only able to perform partial STS without the ability to stand upright/ presented with fwd flexed trunk due to increased pain in right knee with WB. Pt performed sit>supine ModA+2 for trunk and BLE management and Max+2 for bed repositioning. Pt did not tolerate Tx well due to pain and will continue to benefit from skilled PT sessions to improve functional mobility, strength, and activity tolerance to maximize safety/return to PLOF following D/C.        If plan is discharge home, recommend the following: A lot of help with walking and/or transfers;A little help with bathing/dressing/bathroom;Help with stairs or ramp for entrance;Assist for transportation   Can travel by private vehicle   No    Equipment Recommendations Rolling walker (2 wheels);BSC/3in1  Recommendations for Other Services       Functional Status Assessment Patient has had a recent decline in their functional status and demonstrates the ability to make significant improvements in function in a reasonable and predictable amount of time.     Precautions / Restrictions Precautions Precautions: Knee;Fall Required Braces or Orthoses: Knee Immobilizer - Right Restrictions Weight Bearing  Restrictions: Yes RLE Weight Bearing: Weight bearing as tolerated      Mobility  Bed Mobility Overal bed mobility: Needs Assistance Bed Mobility: Supine to Sit, Sit to Supine     Supine to sit: Min assist, HOB elevated, Used rails Sit to supine: Mod assist, +2 for physical assistance   General bed mobility comments: Pt performed bed mobility Min-Mod+2 for trunk and BLE management. Pt did not report any s/sx relative to dizziness with siting at EOB. Light headedness noted when patient performed sit>supine. MaxA+2 necessary for bed repositioning.    Transfers Overall transfer level: Needs assistance Equipment used: Rolling walker (2 wheels) Transfers: Sit to/from Stand Sit to Stand: Mod assist, From elevated surface, +2 physical assistance           General transfer comment: Pt unable to perform full STS with the use of RW (2wheels) and assist +2 due to severe pain noted in RLE.    Ambulation/Gait               General Gait Details: Deferred  Stairs            Wheelchair Mobility     Tilt Bed    Modified Rankin (Stroke Patients Only)       Balance Overall balance assessment: Needs assistance Sitting-balance support: Feet supported Sitting balance-Leahy Scale: Good     Standing balance support: Bilateral upper extremity supported, During functional activity Standing balance-Leahy Scale: Poor  Pertinent Vitals/Pain Pain Assessment Pain Assessment: 0-10 Pain Score: 8  Pain Location: Right knee, left knee, and back Pain Descriptors / Indicators: Aching, Constant Pain Intervention(s): Monitored during session, Limited activity within patient's tolerance    Home Living Family/patient expects to be discharged to:: Private residence Living Arrangements: Alone Available Help at Discharge: Available PRN/intermittently;Family Type of Home: Apartment Home Access: Stairs to enter Entrance Stairs-Rails: Can  reach both Entrance Stairs-Number of Steps: 4-6   Home Layout: One level Home Equipment: Rollator (4 wheels);Cane - single point;Tub bench;Grab bars - toilet;Grab bars - tub/shower;Hand held shower head      Prior Function Prior Level of Function : Needs assist             Mobility Comments: Pt reports the use of rollator and cane with long distance amb. ADLs Comments: Pt reports IND prior to admission     Extremity/Trunk Assessment   Upper Extremity Assessment Upper Extremity Assessment: Overall WFL for tasks assessed    Lower Extremity Assessment Lower Extremity Assessment: RLE deficits/detail RLE Deficits / Details: Non-displaced fx of right tibial tubercle       Communication   Communication Communication: No apparent difficulties Cueing Techniques: Verbal cues;Tactile cues  Cognition Arousal: Alert Behavior During Therapy: WFL for tasks assessed/performed Overall Cognitive Status: Within Functional Limits for tasks assessed                                 General Comments: AOx4. Pt pleasant and willing to participate in PT session.        General Comments      Exercises     Assessment/Plan    PT Assessment Patient needs continued PT services  PT Problem List Decreased strength;Decreased range of motion;Decreased activity tolerance;Decreased balance;Decreased mobility;Pain       PT Treatment Interventions DME instruction;Gait training;Functional mobility training;Therapeutic activities    PT Goals (Current goals can be found in the Care Plan section)  Acute Rehab PT Goals Patient Stated Goal: To go recover and go home PT Goal Formulation: With patient Time For Goal Achievement: 03/21/23 Potential to Achieve Goals: Good    Frequency 7X/week     Co-evaluation               AM-PAC PT "6 Clicks" Mobility  Outcome Measure Help needed turning from your back to your side while in a flat bed without using bedrails?: A Lot Help  needed moving from lying on your back to sitting on the side of a flat bed without using bedrails?: A Lot Help needed moving to and from a bed to a chair (including a wheelchair)?: A Lot Help needed standing up from a chair using your arms (e.g., wheelchair or bedside chair)?: Total Help needed to walk in hospital room?: Total Help needed climbing 3-5 steps with a railing? : Total 6 Click Score: 9    End of Session Equipment Utilized During Treatment: Gait belt Activity Tolerance: Patient limited by pain Patient left: in bed;with call bell/phone within reach;with bed alarm set Nurse Communication: Mobility status PT Visit Diagnosis: Unsteadiness on feet (R26.81);Other abnormalities of gait and mobility (R26.89);Muscle weakness (generalized) (M62.81);Difficulty in walking, not elsewhere classified (R26.2);Pain Pain - Right/Left: Right Pain - part of body: Knee    Time: 1610-9604 PT Time Calculation (min) (ACUTE ONLY): 32 min   Charges:   PT Evaluation $PT Eval Moderate Complexity: 1 Mod PT Treatments $Therapeutic Activity: 8-22 mins PT  General Charges $$ ACUTE PT VISIT: 1 Visit         Maurice Ramseur Sauvignon Howard SPT, LAT, ATC  Comer Devins Sauvignon-Howard 03/07/2023, 3:04 PM

## 2023-03-07 NOTE — Progress Notes (Addendum)
Progress Note   Patient: Sarah Potts UXL:[id number] DOB: [birth year] DOA: 03/06/2023     0 DOS: the patient was seen and examined on 03/07/2023   Brief hospital course:   Sarah Potts is a 89 y.o. female with medical history significant for A.Fib , Htn, Hyperlipidemia who presents to the ER for evaluation following a fall while walking on curb.  Fall was witnessed as patient missed stepped on the curb and did strike her head with negative head ct . Ct cervical spine and MXF ct which is  negative. Pt does have hematoma .At baseline she lives alone at home. Pt came by ems and son at bedside. The fall is on right side and her knee was injured. Left knee is non injured along with hips . No loc.  Pt takes xarelto for A .Fib  Nonfocal today  on exam,  No head/ no vomiting.  Knee immobilizer and pain control and pt non weight bearing until stable for outpatient, Dr.Howard miller orthopedics.     Assessment and Plan:  Status post mechanical fall Imaging shows an acute appearing avulsion of the right tibial tuberosity Appreciate orthopedic surgery input, nonsurgical therapy recommended Recommends weightbearing as tolerated with knee immobilizer PT evaluation discharge to SNF Place patient on fall precautions      History of atrial fibrillation Continue amiodarone and metoprolol Continue Xarelto as primary prophylaxis for an acute stroke     Type 2 diabetes mellitus Hold oral hypoglycemic agents Continue consistent carbohydrate diet    Coronary artery disease Stable Continue metoprolol and atorvastatin    Chronic systolic heart failure Last known LVEF of 40 - 45% from a 2D echocardiogram which was done 09/23 and shows regional wall motion abnormalities involving the anterior/anteroseptal and apical region Not acutely exacerbated Continue metoprolol and Cozaar    Transaminitis ??  Check CK levels If further increase may need to hold statins    Depression Continue  Zoloft     Subjective: Complains of pain with movement of her right leg  Physical Exam: Vitals:   03/07/23 0032 03/07/23 0041 03/07/23 0500 03/07/23 0721  BP: (!) 113/53 (!) 164/59  (!) 147/50  Pulse: (!) 59 (!) 57  (!) 56  Resp: 18 17  17   Temp:    (!) 97.5 F (36.4 C)  SpO2: 96% 99%  98%  Weight:   88.1 kg   Height:       Vitals and nursing note reviewed.  Constitutional:      Appearance: She is not toxic-appearing.  HENT:     Head: Normocephalic and atraumatic.     Right Ear: Hearing normal.     Left Ear: Hearing normal.     Nose: Nose normal. No nasal deformity.     Mouth/Throat:     Lips: Pink.     Tongue: No lesions.     Pharynx: Oropharynx is clear.  Eyes:     General: Lids are normal.     Extraocular Movements: Extraocular movements intact.  Cardiovascular:     Rate and Rhythm: Normal rate and regular rhythm.     Heart sounds: Normal heart sounds.  Pulmonary:     Effort: Pulmonary effort is normal.     Breath sounds: Normal breath sounds.  Abdominal:     General: Bowel sounds are normal. There is no distension.     Palpations: Abdomen is soft. There is no mass.     Tenderness: There is no abdominal tenderness.  Musculoskeletal:  General: Swelling, tenderness and signs of injury present.  Right knee immobilizer in place    Right lower leg: No edema.     Left lower leg: No edema.  Skin:    General: Skin is warm.  Neurological:     General: No focal deficit present.     Mental Status: She is alert and oriented to person, place, and time.     Cranial Nerves: Cranial nerves 2-12 are intact.  Psychiatric:        Attention and Perception: Attention normal.        Mood and Affect: Mood normal.        Speech: Speech normal.        Behavior: Behavior normal. Behavior is cooperative.    Data Reviewed: Labs reviewed.  BUN 40, creatinine 1.5 improved from 1.85, AST 80, ALT 58 There are no new results to review at this time.  Family Communication: Plan  of care discussed with patient at the bedside.  She verbalizes understanding and agrees with the plan  Disposition: Status is: Observation The patient remains OBS appropriate and will d/c before 2 midnights.  Planned Discharge Destination: Skilled nursing facility    Time spent: 33 minutes  Author: Lucile Shutters, MD 03/07/2023 11:35 AM  For on call review [email or web address].

## 2023-03-08 DIAGNOSIS — E1129 Type 2 diabetes mellitus with other diabetic kidney complication: Secondary | ICD-10-CM

## 2023-03-08 DIAGNOSIS — I25118 Atherosclerotic heart disease of native coronary artery with other forms of angina pectoris: Secondary | ICD-10-CM | POA: Diagnosis not present

## 2023-03-08 DIAGNOSIS — W19XXXA Unspecified fall, initial encounter: Secondary | ICD-10-CM | POA: Diagnosis not present

## 2023-03-08 DIAGNOSIS — N1831 Chronic kidney disease, stage 3a: Secondary | ICD-10-CM | POA: Diagnosis not present

## 2023-03-08 LAB — GLUCOSE, CAPILLARY
Glucose-Capillary: 196 mg/dL — ABNORMAL HIGH (ref 70–99)
Glucose-Capillary: 164 mg/dL — ABNORMAL HIGH (ref 70–99)
Glucose-Capillary: 253 mg/dL — ABNORMAL HIGH (ref 70–99)
Glucose-Capillary: 135 mg/dL — ABNORMAL HIGH (ref 70–99)

## 2023-03-08 MED ORDER — INSULIN ASPART 100 UNIT/ML IJ SOLN: 15.0000 [IU] | Freq: Once | INTRAMUSCULAR | Status: DC

## 2023-03-08 MED ORDER — OXYCODONE HCL 5 MG PO TABS
5.0000 mg | ORAL_TABLET | Freq: Four times a day (QID) | ORAL | Status: DC | PRN
  Administered 2023-03-08: 10 mg via ORAL
  Administered 2023-03-08: 5 mg via ORAL
  Administered 2023-03-09 (×3): 10 mg via ORAL
  Administered 2023-03-10 (×2): 5 mg via ORAL
  Administered 2023-03-10: 10 mg via ORAL
  Administered 2023-03-11 – 2023-03-13 (×5): 5 mg via ORAL
  Filled 2023-03-08: qty 1
  Filled 2023-03-08 (×2): qty 2
  Filled 2023-03-08: qty 1
  Filled 2023-03-08: qty 2
  Filled 2023-03-08 (×6): qty 1
  Filled 2023-03-08: qty 2
  Filled 2023-03-08: qty 1
  Filled 2023-03-08: qty 2
  Filled 2023-03-08: qty 1

## 2023-03-08 NOTE — TOC Initial Note (Addendum)
Transition of Care [facility]) - Initial/Assessment Note    Patient Details  Name: Sarah Potts MRN: [id number] Date of Birth: 01-08-1933  Transition of Care [facility]) CM/SW Contact:    Liliana Cline, LCSW Phone Number: 03/08/2023, 2:08 PM  Clinical Narrative:                 CSW met with patient at bedside. Explained the recommendation from PT/OT for SNF. Patient is agreeable, patient has been to a SNF in the past out of state. Provided Medicare Care Compare list of local SNFs for patient to review. Patient states she knows her top choice would be [facility], understands this is based on availability. May also be interested in [facility] in [place]. Does not want [facility]. SNF work up started.   Expected Discharge Plan: Skilled Nursing Facility Barriers to Discharge: Continued Medical Work up   Patient Goals and CMS Choice Patient states their goals for this hospitalization and ongoing recovery are:: SNF for STR CMS Medicare.gov Compare Post Acute Care list provided to:: Patient Choice offered to / list presented to : Patient      Expected Discharge Plan and Services       Living arrangements for the past 2 months: Single Family Home                                      Prior Living Arrangements/Services Living arrangements for the past 2 months: Single Family Home Lives with:: Self Patient language and need for interpreter reviewed:: Yes Do you feel safe going back to the place where you live?: Yes      Need for Family Participation in Patient Care: Yes (Comment)     Criminal Activity/Legal Involvement Pertinent to Current Situation/Hospitalization: No - Comment as needed  Activities of Daily Living   ADL Screening (condition at time of admission) Independently performs ADLs?: No Does the patient have a NEW difficulty with bathing/dressing/toileting/self-feeding that is expected to last >3 days?: No Does the patient have a NEW difficulty with getting in/out of  bed, walking, or climbing stairs that is expected to last >3 days?: No Does the patient have a NEW difficulty with communication that is expected to last >3 days?: No Is the patient deaf or have difficulty hearing?: No Does the patient have difficulty seeing, even when wearing glasses/contacts?: Yes Does the patient have difficulty concentrating, remembering, or making decisions?: No  Permission Sought/Granted Permission sought to share information with : Facility Industrial/product designer granted to share information with : Yes, Verbal Permission Granted     Permission granted to share info w AGENCY: SNFs        Emotional Assessment       Orientation: : Oriented to Self, Oriented to Place, Oriented to  Time, Oriented to Situation Alcohol / Substance Use: Not Applicable Psych Involvement: No (comment)  Admission diagnosis:  Fall [W19.XXXA] Fall, initial encounter [id number]] Avulsion fracture of tibial tuberosity [S82.153A] Patient Active Problem List   Diagnosis Date Noted   Fall 03/06/2023   H/O adenomatous polyp of colon 09/29/2022   Stress-induced cardiomyopathy    Ischemic cardiomyopathy    NSTEMI (non-ST elevated myocardial infarction) (HCC) 12/20/2021   Atrial fibrillation, chronic (HCC) 12/20/2021   Type II diabetes mellitus with renal manifestations (HCC) 12/20/2021   Obesity with body mass index (BMI) of 30.0 to 39.9 12/20/2021   Chronic diastolic CHF (congestive heart failure) (HCC)  12/20/2021   UTI (urinary tract infection) 12/20/2021   Asthma 12/01/2021   Atrial fibrillation with RVR (HCC) 12/01/2021   Colostomy in place [facility]) 03/16/2021   Absolute anemia 01/09/2021   Lung nodule 01/09/2021   Anemia in stage 3a chronic kidney disease (HCC) 01/09/2021   B12 deficiency 01/09/2021   Stage 3a chronic kidney disease (HCC) 01/09/2021   Goals of care, counseling/discussion 12/18/2020   History of anal cancer 12/11/2020   Diarrhea 01/02/2014   Iron  deficiency anemia 01/02/2014   Bronchitis 12/15/2012   CAD (coronary artery disease) 11/13/2010   HTN (hypertension) 11/13/2010   Diabetes mellitus (HCC) 11/13/2010   Hyperlipidemia 11/13/2010   PCP:  Jerl Mina, MD Pharmacy:   [facility] - [place], [place] - [address] [place] [address] Phone: [phone] Fax: [phone]  [facility] [phone] - [place], [place] - [address] [place] [address] Phone: [phone] Fax: [phone]  [facility] [phone] - [place], [place] - [address] [place] [address] Phone: [phone] Fax: [phone]     Social Determinants of Health (SDOH) Social History: SDOH Screenings   Food Insecurity: No Food Insecurity (03/07/2023)  Housing: Patient Declined (03/07/2023)  Transportation Needs: No Transportation Needs (03/07/2023)  Utilities: Not At Risk (03/07/2023)  Tobacco Use: Low Risk  (03/06/2023)   SDOH Interventions:     Readmission Risk Interventions     No data to display

## 2023-03-08 NOTE — NC FL2 (Signed)
[place] MEDICAID FL2 LEVEL OF CARE FORM     IDENTIFICATION  Patient Name: Sarah Potts Birthdate: 1932-09-11 Sex: female Admission Date (Current Location): 03/06/2023  [facility] and [place] Number:  Chiropodist and Address:  [facility], [address] [place], [place] [address]      Provider Number: [id number]  Attending Physician Name and Address:  Kathrynn Running, MD  Relative Name and Phone Number:  Berneta Levins (Son)  [phone] [facility])    Current Level of Care: Hospital Recommended Level of Care: Skilled Nursing Facility Prior Approval Number:    Date Approved/Denied:   PASRR Number: [id number]  Discharge Plan:      Current Diagnoses: Patient Active Problem List   Diagnosis Date Noted   Fall 03/06/2023   H/O adenomatous polyp of colon 09/29/2022   Stress-induced cardiomyopathy    Ischemic cardiomyopathy    NSTEMI (non-ST elevated myocardial infarction) (HCC) 12/20/2021   Atrial fibrillation, chronic (HCC) 12/20/2021   Type II diabetes mellitus with renal manifestations (HCC) 12/20/2021   Obesity with body mass index (BMI) of 30.0 to 39.9 12/20/2021   Chronic diastolic CHF (congestive heart failure) (HCC) 12/20/2021   UTI (urinary tract infection) 12/20/2021   Asthma 12/01/2021   Atrial fibrillation with RVR (HCC) 12/01/2021   Colostomy in place (HCC) 03/16/2021   Absolute anemia 01/09/2021   Lung nodule 01/09/2021   Anemia in stage 3a chronic kidney disease (HCC) 01/09/2021   B12 deficiency 01/09/2021   Stage 3a chronic kidney disease (HCC) 01/09/2021   Goals of care, counseling/discussion 12/18/2020   History of anal cancer 12/11/2020   Diarrhea 01/02/2014   Iron deficiency anemia 01/02/2014   Bronchitis 12/15/2012   CAD (coronary artery disease) 11/13/2010   HTN (hypertension) 11/13/2010   Diabetes mellitus (HCC) 11/13/2010   Hyperlipidemia 11/13/2010    Orientation RESPIRATION BLADDER Height & Weight      Self, Time, Situation, Place  Normal Incontinent Weight: 187 lb 6.3 oz (85 kg) Height:  5\' 2"  (157.5 cm)  BEHAVIORAL SYMPTOMS/MOOD NEUROLOGICAL BOWEL NUTRITION STATUS      Colostomy Diet (carb modified)  AMBULATORY STATUS COMMUNICATION OF NEEDS Skin   Extensive Assist Verbally Skin abrasions, Bruising                       Personal Care Assistance Level of Assistance  Bathing, Feeding, Dressing Bathing Assistance: Maximum assistance Feeding assistance: Limited assistance Dressing Assistance: Maximum assistance     Functional Limitations Info             SPECIAL CARE FACTORS FREQUENCY  PT (By licensed PT), OT (By licensed OT)     PT Frequency: 5 times per week OT Frequency: 5 times per week            Contractures      Additional Factors Info  Code Status, Allergies Code Status Info: full Allergies Info: Levofloxacin, Niacin, Niacin And Related           Current Medications (03/08/2023):  This is the current hospital active medication list Current Facility-Administered Medications  Medication Dose Route Frequency Provider Last Rate Last Admin   acetaminophen (TYLENOL) tablet 650 mg  650 mg Oral Q6H PRN Gertha Calkin, MD       Or   acetaminophen (TYLENOL) suppository 650 mg  650 mg Rectal Q6H PRN Gertha Calkin, MD       albuterol (PROVENTIL) (2.5 MG/3ML) 0.083% nebulizer solution 2.5 mg  2.5 mg Inhalation  Q4H PRN Agbata, Tochukwu, MD       amiodarone (PACERONE) tablet 200 mg  200 mg Oral Daily Agbata, Tochukwu, MD   200 mg at 03/08/23 0830   atorvastatin (LIPITOR) tablet 40 mg  40 mg Oral q AM Agbata, Tochukwu, MD   40 mg at 03/08/23 0830   ezetimibe (ZETIA) tablet 10 mg  10 mg Oral q AM Agbata, Tochukwu, MD   10 mg at 03/08/23 0829   hydrALAZINE (APRESOLINE) injection 10 mg  10 mg Intravenous Q6H PRN Gertha Calkin, MD       insulin aspart (novoLOG) injection 0-15 Units  0-15 Units Subcutaneous TID WC Agbata, Tochukwu, MD   3 Units at 03/08/23 1216    losartan (COZAAR) tablet 100 mg  100 mg Oral Daily Agbata, Tochukwu, MD   100 mg at 03/08/23 0830   magnesium oxide (MAG-OX) tablet 400 mg  400 mg Oral Daily Agbata, Tochukwu, MD   400 mg at 03/08/23 0829   metoprolol succinate (TOPROL-XL) 24 hr tablet 50 mg  50 mg Oral Daily Agbata, Tochukwu, MD   50 mg at 03/08/23 8295   morphine (PF) 2 MG/ML injection 2 mg  2 mg Intravenous Q4H PRN Gertha Calkin, MD   2 mg at 03/07/23 0849   nitroGLYCERIN (NITROSTAT) SL tablet 0.4 mg  0.4 mg Sublingual Q5 min PRN Agbata, Tochukwu, MD       omega-3 acid ethyl esters (LOVAZA) capsule 2 g  2 g Oral BID Agbata, Tochukwu, MD   2 g at 03/08/23 6213   oxyCODONE (Oxy IR/ROXICODONE) immediate release tablet 5-10 mg  5-10 mg Oral Q6H PRN Kathrynn Running, MD   5 mg at 03/08/23 1337   pantoprazole (PROTONIX) EC tablet 40 mg  40 mg Oral Daily Agbata, Tochukwu, MD   40 mg at 03/08/23 0830   Rivaroxaban (XARELTO) tablet 15 mg  15 mg Oral Q supper Agbata, Tochukwu, MD   15 mg at 03/07/23 1720   sertraline (ZOLOFT) tablet 50 mg  50 mg Oral Daily Agbata, Tochukwu, MD   50 mg at 03/08/23 0830     Discharge Medications: Please see discharge summary for a list of discharge medications.  Relevant Imaging Results:  Relevant Lab Results:   Additional Information SS #: [id number]  Greenlee Ancheta E Jurnie Garritano, LCSW

## 2023-03-08 NOTE — Progress Notes (Signed)
Physical Therapy Treatment Patient Details Name: Sarah Potts MRN: [id number] DOB: 04/03/1933 Today's Date: 03/08/2023   History of Present Illness Pt is 89 y/o admitted 03/06/23 for a fall. As a result of the fall, pt endured a non-displaced fx of the right tibial tubercle. PmHx includes: CAD, asthma, chronic AFib, and stage 3a CKD.    PT Comments  Pt in bed, ready to try to get up.  Participated in exercises as described below.  She is able to get to EOB with min a x 1 mostly for RLE management and some assist for upper body but overall does well.  Steady in sitting with no assist or  LOB.  She attempts to stand with RW and mod a +1 but struggles to get both hands up on walker and stand upright so she is seated back on bed and +2 called.  With +2 support she is able to step to chair  with RW and slow cautious steps.  Gait progression limited by pain.  KI in place at all times.   If plan is discharge home, recommend the following: Help with stairs or ramp for entrance;Assist for transportation;Two people to help with walking and/or transfers;A lot of help with bathing/dressing/bathroom;Assistance with cooking/housework   Can travel by private Automotive engineer (2 wheels);BSC/3in1    Recommendations for Other Services       Precautions / Restrictions Precautions Precautions: Knee;Fall Required Braces or Orthoses: Knee Immobilizer - Right Restrictions Weight Bearing Restrictions: Yes RLE Weight Bearing: Weight bearing as tolerated     Mobility  Bed Mobility Overal bed mobility: Needs Assistance Bed Mobility: Supine to Sit     Supine to sit: Min assist, HOB elevated, Used rails       Patient Response: Cooperative  Transfers Overall transfer level: Needs assistance Equipment used: Rolling walker (2 wheels) Transfers: Sit to/from Stand Sit to Stand: Mod assist, From elevated surface, +2 physical assistance           General  transfer comment: unable to stand with mod a +1 so +2 is called to assist    Ambulation/Gait Ambulation/Gait assistance: Mod assist, +2 physical assistance Gait Distance (Feet): 3 Feet Assistive device: Rolling walker (2 wheels) Gait Pattern/deviations: Step-to pattern Gait velocity: decreased     General Gait Details: Deferred   Stairs             Wheelchair Mobility     Tilt Bed Tilt Bed Patient Response: Cooperative  Modified Rankin (Stroke Patients Only)       Balance Overall balance assessment: Needs assistance Sitting-balance support: Feet supported Sitting balance-Leahy Scale: Good     Standing balance support: Bilateral upper extremity supported, During functional activity Standing balance-Leahy Scale: Poor Standing balance comment: heavy reliance on RW for support with small cautios steps                            Cognition Arousal: Alert Behavior During Therapy: WFL for tasks assessed/performed Overall Cognitive Status: Within Functional Limits for tasks assessed                                          Exercises Other Exercises Other Exercises: supine AAROM RLE, AROM LLE x 10 prior to mobility    General Comments  Pertinent Vitals/Pain Pain Assessment Pain Assessment: Faces Faces Pain Scale: Hurts even more Pain Location: Right knee, left knee, and back Pain Descriptors / Indicators: Aching, Constant Pain Intervention(s): Limited activity within patient's tolerance, Monitored during session, Repositioned    Home Living                          Prior Function            PT Goals (current goals can now be found in the care plan section) Progress towards PT goals: Progressing toward goals    Frequency    7X/week      PT Plan      Co-evaluation              AM-PAC PT "6 Clicks" Mobility   Outcome Measure  Help needed turning from your back to your side while in a flat  bed without using bedrails?: A Lot Help needed moving from lying on your back to sitting on the side of a flat bed without using bedrails?: A Lot Help needed moving to and from a bed to a chair (including a wheelchair)?: A Lot Help needed standing up from a chair using your arms (e.g., wheelchair or bedside chair)?: A Lot Help needed to walk in hospital room?: A Lot Help needed climbing 3-5 steps with a railing? : Total 6 Click Score: 11    End of Session Equipment Utilized During Treatment: Gait belt Activity Tolerance: Patient tolerated treatment well Patient left: in chair;with call bell/phone within reach;with chair alarm set Nurse Communication: Mobility status PT Visit Diagnosis: Unsteadiness on feet (R26.81);Other abnormalities of gait and mobility (R26.89);Muscle weakness (generalized) (M62.81);Difficulty in walking, not elsewhere classified (R26.2);Pain Pain - Right/Left: Right Pain - part of body: Knee     Time: 0350-0938 PT Time Calculation (min) (ACUTE ONLY): 17 min  Charges:    $Therapeutic Exercise: 8-22 mins PT General Charges $$ ACUTE PT VISIT: 1 Visit                   Danielle Dess, PTA 03/08/23, 9:58 AM

## 2023-03-08 NOTE — Progress Notes (Addendum)
  Progress Note   Patient: Sarah Potts [id number]:[id number] DOB: 03-04-1933 DOA: 03/06/2023     1 DOS: the patient was seen and examined on 03/08/2023   Brief hospital course:   JODE NIDO is a 89 y.o. female with medical history significant for A.Fib , Htn, Hyperlipidemia who presents to the ER for evaluation following a fall while walking on curb.  Fall was witnessed as patient missed stepped on the curb and did strike her head with negative head ct . Ct cervical spine and MXF ct which is  negative. Pt does have hematoma .At baseline she lives alone at home. Pt came by ems and son at bedside. The fall is on right side and her knee was injured. Left knee is non injured along with hips . No loc.  Pt takes xarelto for A .Fib  Nonfocal today  on exam,  No head/ no vomiting.  Knee immobilizer and pain control and pt non weight bearing until stable for outpatient, Dr.Howard miller orthopedics.     Assessment and Plan:  Status post mechanical fall Right tibial tuberosity avulsion Imaging shows an acute appearing avulsion of the right tibial tuberosity Appreciate orthopedic surgery input, nonsurgical therapy recommended Recommends weightbearing as tolerated with knee immobilizer PT evaluation discharge to SNF, TOC working on this Place patient on fall precautions Pain control  History of atrial fibrillation Continue amiodarone and metoprolol Continue Xarelto as primary prophylaxis for an acute stroke   Type 2 diabetes mellitus Hold oral hypoglycemic agents Continue consistent carbohydrate diet, SSI  Coronary artery disease Stable, asymptomatic Continue metoprolol and atorvastatin  Chronic systolic heart failure Last known LVEF of 40 - 45% from a 2D echocardiogram which was done 09/23 and shows regional wall motion abnormalities involving the anterior/anteroseptal and apical region Not acutely exacerbated Continue metoprolol and Cozaar  Transaminitis Stable from priors,  mild  History anal cancer - has colostomy   Depression Continue Zoloft     Subjective: Complains of ongoing right knee pain  Physical Exam: Vitals:   03/07/23 2217 03/08/23 0443 03/08/23 0735 03/08/23 0829  BP: (!) 116/58  (!) 145/56   Pulse: 61  (!) 56 61  Resp:   16   Temp: 98.4 F (36.9 C)  99.4 F (37.4 C)   TempSrc: Oral     SpO2: 97%  94%   Weight:  85 kg    Height:       Vitals and nursing note reviewed.  Constitutional:      Appearance: NAD HENT:  Bruise around right eye Cardiovascular:     Rate and Rhythm: rr, no murmur Pulmonary:     Effort: Pulmonary effort is normal.     Breath sounds: Normal breath sounds.  Abdominal:     General: Bowel sounds are normal. There is no distension.  Ostomy intact Musculoskeletal:       Knee immobilizer right knee Skin:    General: Skin is warm.  Neurological:    Moving all 4, distal sensation intact Psychiatric:     calm   Data Reviewed: There are no new results to review at this time.  Family Communication: none at bedside. No answer when son called today.  Disposition: Status is: inpt  Planned Discharge Destination: Skilled nursing facility    Time spent: 35 minutes  Author: Silvano Bilis, MD 03/08/2023 1:03 PM  For on call review [email or web address].

## 2023-03-09 DIAGNOSIS — E1129 Type 2 diabetes mellitus with other diabetic kidney complication: Secondary | ICD-10-CM | POA: Diagnosis not present

## 2023-03-09 DIAGNOSIS — W19XXXA Unspecified fall, initial encounter: Secondary | ICD-10-CM | POA: Diagnosis not present

## 2023-03-09 DIAGNOSIS — I482 Chronic atrial fibrillation, unspecified: Secondary | ICD-10-CM | POA: Diagnosis not present

## 2023-03-09 DIAGNOSIS — I25118 Atherosclerotic heart disease of native coronary artery with other forms of angina pectoris: Secondary | ICD-10-CM | POA: Diagnosis not present

## 2023-03-09 LAB — GLUCOSE, CAPILLARY
Glucose-Capillary: 186 mg/dL — ABNORMAL HIGH (ref 70–99)
Glucose-Capillary: 238 mg/dL — ABNORMAL HIGH (ref 70–99)
Glucose-Capillary: 205 mg/dL — ABNORMAL HIGH (ref 70–99)
Glucose-Capillary: 238 mg/dL — ABNORMAL HIGH (ref 70–99)

## 2023-03-09 NOTE — TOC Progression Note (Addendum)
Transition of Care [facility]) - Progression Note    Patient Details  Name: Sarah Potts MRN: [id number] Date of Birth: 02/10/1933  Transition of Care [facility]) CM/SW Contact  Margarito Liner, LCSW Phone Number: 03/09/2023, 12:25 PM  Clinical Narrative:  No bed offers at this time. [facility] is not in network with [employer]. CSW left message for [facility] admissions coordinator asking her to review.   1:37 pm: Patient has accepted the bed offer from [facility] [place]. Patient called son so he is aware.  2:04 pm: CSW started SNF insurance authorization.  Expected Discharge Plan: Skilled Nursing Facility Barriers to Discharge: Continued Medical Work up  Expected Discharge Plan and Services       Living arrangements for the past 2 months: Single Family Home                                       Social Determinants of Health (SDOH) Interventions SDOH Screenings   Food Insecurity: No Food Insecurity (03/07/2023)  Housing: Patient Declined (03/07/2023)  Transportation Needs: No Transportation Needs (03/07/2023)  Utilities: Not At Risk (03/07/2023)  Tobacco Use: Low Risk  (03/06/2023)    Readmission Risk Interventions     No data to display

## 2023-03-09 NOTE — Progress Notes (Signed)
Physical Therapy Treatment Patient Details Name: Sarah Potts MRN: [id number] DOB: December 15, 1932 Today's Date: 03/09/2023   History of Present Illness Pt is 89 y/o admitted 03/06/23 for a fall. As a result of the fall, pt endured a non-displaced fx of the right tibial tubercle. PmHx includes: CAD, asthma, chronic AFib, and stage 3a CKD.    PT Comments  Pt is making good progress with mobility progressing to +1 min to Mod A level.  Pt performed supervision with bed mobility, mod A with sit<>stand from elevated surface, and min A with gait with RW. Pt continues to be limited by pain in R LE and poor activity tolerance. Continued PT will assist pt towards greater safety and independence with mobility.   If plan is discharge home, recommend the following: Help with stairs or ramp for entrance;Assist for transportation;A lot of help with bathing/dressing/bathroom;Assistance with cooking/housework;A lot of help with walking and/or transfers   Can travel by private vehicle     No  Equipment Recommendations  Rolling walker (2 wheels);BSC/3in1    Recommendations for Other Services       Precautions / Restrictions Precautions Precautions: Knee;Fall Required Braces or Orthoses: Knee Immobilizer - Right Restrictions Weight Bearing Restrictions: Yes RLE Weight Bearing: Weight bearing as tolerated     Mobility  Bed Mobility Overal bed mobility: Needs Assistance Bed Mobility: Supine to Sit     Supine to sit: Supervision     General bed mobility comments: Pt remained in recliner at end of session.  Pt had no reports of dizziness/ light headedness.    Transfers Overall transfer level: Needs assistance Equipment used: Rolling walker (2 wheels) Transfers: Sit to/from Stand Sit to Stand: Mod assist, From elevated surface           General transfer comment: able to stand with mod a +1, needed extra time to stand upright.    Ambulation/Gait Ambulation/Gait assistance: Min  assist Gait Distance (Feet): 3 Feet (x2) Assistive device: Rolling walker (2 wheels) Gait Pattern/deviations: Step-to pattern       General Gait Details: took and few steps forward/backwards then needed seated rest, then was able to step around to recliner.   Stairs             Wheelchair Mobility     Tilt Bed    Modified Rankin (Stroke Patients Only)       Balance Overall balance assessment: Needs assistance Sitting-balance support: Feet supported Sitting balance-Leahy Scale: Good     Standing balance support: Bilateral upper extremity supported, During functional activity   Standing balance comment: heavy reliance on RW for support with small cautious steps                            Cognition Arousal: Alert Behavior During Therapy: WFL for tasks assessed/performed Overall Cognitive Status: Within Functional Limits for tasks assessed                                          Exercises      General Comments        Pertinent Vitals/Pain Pain Assessment Pain Assessment: 0-10 Pain Score: 6  Pain Location: Right knee, left knee, and back Pain Descriptors / Indicators: Aching, Constant Pain Intervention(s): Limited activity within patient's tolerance, Monitored during session, Premedicated before session    Home Living  Prior Function            PT Goals (current goals can now be found in the care plan section) Acute Rehab PT Goals Patient Stated Goal: To go recover and go home PT Goal Formulation: With patient Time For Goal Achievement: 03/21/23 Potential to Achieve Goals: Good Progress towards PT goals: Progressing toward goals    Frequency    7X/week      PT Plan      Co-evaluation              AM-PAC PT "6 Clicks" Mobility   Outcome Measure  Help needed turning from your back to your side while in a flat bed without using bedrails?: A Little Help needed moving  from lying on your back to sitting on the side of a flat bed without using bedrails?: A Lot Help needed moving to and from a bed to a chair (including a wheelchair)?: A Lot Help needed standing up from a chair using your arms (e.g., wheelchair or bedside chair)?: A Lot Help needed to walk in hospital room?: A Lot   6 Click Score: 11    End of Session Equipment Utilized During Treatment: Gait belt Activity Tolerance: Patient tolerated treatment well Patient left: in chair;with call bell/phone within reach;with chair alarm set Nurse Communication: Mobility status PT Visit Diagnosis: Unsteadiness on feet (R26.81);Other abnormalities of gait and mobility (R26.89);Muscle weakness (generalized) (M62.81);Difficulty in walking, not elsewhere classified (R26.2);Pain Pain - Right/Left: Right Pain - part of body: Knee     Time: 0943-1000 PT Time Calculation (min) (ACUTE ONLY): 17 min  Charges:    $Therapeutic Activity: 8-22 mins PT General Charges $$ ACUTE PT VISIT: 1 Visit                     Hortencia Conradi, PTA  03/09/23, 10:19 AM

## 2023-03-09 NOTE — Progress Notes (Signed)
Subjective:   Patient is out of bed in a chair today.  She is alert and oriented.  Spirits are good.  She has been admitted for nursing care and [facility]. Ankle motion is good.  Sensation is good distally.  Brace is in good position.    Patient reports pain as moderate.  Objective:   VITALS:   Vitals:   03/09/23 0001 03/09/23 0900  BP: 128/60 138/70  Pulse: 62 60  Resp: 16 18  Temp: 98.1 F (36.7 C) 98.6 F (37 C)  SpO2: 96% 96%    Neurologically intact Sensation intact distally Dorsiflexion/Plantar flexion intact  LABS Recent Labs    03/06/23 2241 03/07/23 0428  HGB 11.5* 11.1*  HCT 35.3* 33.1*  WBC 6.9 7.0  PLT 279 253    Recent Labs    03/06/23 2241 03/07/23 0428  NA 132* 135  K 3.9 4.2  BUN 39* 40*  CREATININE 1.85* 1.55*  GLUCOSE 257* 197*    No results for input(s): "LABPT", "INR" in the last 72 hours.   Assessment/Plan:      Up with therapy Discharge to SNF  Follow-up in my office in 2 weeks.  81 mg ASA twice daily for DVT prophylaxis

## 2023-03-09 NOTE — Progress Notes (Signed)
Progress Note   Patient: Sarah Potts MWU:[id number] DOB: March 09, 1933 DOA: 03/06/2023     2 DOS: the patient was seen and examined on 03/09/2023   Brief hospital course:  Sarah Potts is a 89 y.o. female with medical history significant for A.Fib , Htn, Hyperlipidemia who presents to the ER for evaluation following a fall while walking on curb.  Fall was witnessed as patient missed stepped on the curb and did strike her head with negative head ct . Ct cervical spine and MXF ct which is  negative. Pt does have hematoma .At baseline she lives alone at home. Pt came by ems and son at bedside. The fall is on right side and her knee was injured. Left knee is non injured along with hips . No loc.  Pt takes xarelto for A .Fib  Nonfocal today  on exam,  No head/ no vomiting.  Knee immobilizer and pain control and pt non weight bearing until stable for outpatient, Dr.Howard miller orthopedics.     Assessment and Plan:  Status post mechanical fall Imaging shows an acute appearing avulsion of the right tibial tuberosity Appreciate orthopedic surgery input, nonsurgical therapy recommended Recommends weightbearing as tolerated with knee immobilizer PT evaluation recommends discharge to SNF Place patient on fall precautions           History of atrial fibrillation Continue amiodarone and metoprolol Continue Xarelto as primary prophylaxis for an acute stroke         Type 2 diabetes mellitus Hold oral hypoglycemic agents Continue consistent carbohydrate diet       Coronary artery disease Stable Continue metoprolol and atorvastatin       Chronic systolic heart failure Last known LVEF of 40 - 45% from a 2D echocardiogram which was done 09/23 and shows regional wall motion abnormalities involving the anterior/anteroseptal and apical region Not acutely exacerbated Continue metoprolol and Cozaar       Transaminitis ??  Check CK levels If further increase may need to hold  statins       Depression Continue Zoloft          Subjective: Patient is seen and examined at the bedside.  No new complaints.  Physical Exam: Vitals:   03/08/23 1601 03/09/23 0001 03/09/23 0500 03/09/23 0900  BP: (!) 132/48 128/60  138/70  Pulse: (!) 56 62  60  Resp: 16 16  18   Temp: 98.1 F (36.7 C) 98.1 F (36.7 C)  98.6 F (37 C)  TempSrc:    Oral  SpO2: 95% 96%  96%  Weight:   85.3 kg   Height:       Vitals and nursing note reviewed.  Constitutional:      Appearance: She is not toxic-appearing.  HENT:     Head: Normocephalic and atraumatic.     Right Ear: Hearing normal.     Left Ear: Hearing normal.     Nose: Nose normal. No nasal deformity.     Mouth/Throat:     Lips: Pink.     Tongue: No lesions.     Pharynx: Oropharynx is clear.  Eyes:     General: Lids are normal.     Extraocular Movements: Extraocular movements intact.  Cardiovascular:     Rate and Rhythm: Normal rate and regular rhythm.     Heart sounds: Normal heart sounds.  Pulmonary:     Effort: Pulmonary effort is normal.     Breath sounds: Normal breath sounds.  Abdominal:  General: Bowel sounds are normal. There is no distension.     Palpations: Abdomen is soft. There is no mass.     Tenderness: There is no abdominal tenderness.  Musculoskeletal:        General: Swelling, tenderness and signs of injury present.  Right knee immobilizer in place    Right lower leg: No edema.     Left lower leg: No edema.  Skin:    General: Skin is warm.  Neurological:     General: No focal deficit present.     Mental Status: She is alert and oriented to person, place, and time.     Cranial Nerves: Cranial nerves 2-12 are intact.  Psychiatric:        Attention and Perception: Attention normal.        Mood and Affect: Mood normal.        Speech: Speech normal.        Behavior: Behavior normal. Behavior is cooperative.    Data Reviewed: Labs reviewed There are no new results to review at this  time.  Family Communication: Plan of care discussed with patient at the bedside.  She verbalizes understanding and agrees with the plan  Disposition: Status is: Inpatient Remains inpatient appropriate because: Awaiting placement  Planned Discharge Destination: Skilled nursing facility    Time spent: 32 minutes  Author: Lucile Shutters, MD 03/09/2023 12:04 PM  For on call review [email or web address].

## 2023-03-10 DIAGNOSIS — I10 Essential (primary) hypertension: Secondary | ICD-10-CM | POA: Diagnosis not present

## 2023-03-10 DIAGNOSIS — I25118 Atherosclerotic heart disease of native coronary artery with other forms of angina pectoris: Secondary | ICD-10-CM | POA: Diagnosis not present

## 2023-03-10 DIAGNOSIS — W19XXXA Unspecified fall, initial encounter: Secondary | ICD-10-CM | POA: Diagnosis not present

## 2023-03-10 DIAGNOSIS — N1831 Chronic kidney disease, stage 3a: Secondary | ICD-10-CM | POA: Diagnosis not present

## 2023-03-10 LAB — GLUCOSE, CAPILLARY
Glucose-Capillary: 173 mg/dL — ABNORMAL HIGH (ref 70–99)
Glucose-Capillary: 176 mg/dL — ABNORMAL HIGH (ref 70–99)
Glucose-Capillary: 207 mg/dL — ABNORMAL HIGH (ref 70–99)
Glucose-Capillary: 171 mg/dL — ABNORMAL HIGH (ref 70–99)

## 2023-03-10 LAB — CBC
HCT: 29.9 % — ABNORMAL LOW (ref 36.0–46.0)
Hemoglobin: 10 g/dL — ABNORMAL LOW (ref 12.0–15.0)
MCH: 32.4 pg (ref 26.0–34.0)
MCHC: 33.4 g/dL (ref 30.0–36.0)
MCV: 96.8 fL (ref 80.0–100.0)
Platelets: 225 10*3/uL (ref 150–400)
RBC: 3.09 MIL/uL — ABNORMAL LOW (ref 3.87–5.11)
RDW: 13.6 % (ref 11.5–15.5)
WBC: 5.5 10*3/uL (ref 4.0–10.5)
nRBC: 0 % (ref 0.0–0.2)

## 2023-03-10 LAB — CREATININE, SERUM
Creatinine, Ser: 2 mg/dL — ABNORMAL HIGH (ref 0.44–1.00)
GFR, Estimated: 24 mL/min — ABNORMAL LOW (ref >60)

## 2023-03-10 MED ORDER — ACETAMINOPHEN 650 MG RE SUPP: 650.0000 mg | Freq: Four times a day (QID) | RECTAL | Status: DC | PRN

## 2023-03-10 MED ORDER — LACTATED RINGERS IV SOLN: INTRAVENOUS | Status: AC

## 2023-03-10 MED ORDER — ACETAMINOPHEN 325 MG PO TABS
650.0000 mg | ORAL_TABLET | Freq: Four times a day (QID) | ORAL | Status: DC | PRN
  Administered 2023-03-13: 650 mg via ORAL
  Filled 2023-03-10 (×2): qty 2

## 2023-03-10 NOTE — Care Management Important Message (Signed)
Important Message  Patient Details  Name: Sarah Potts MRN: [id number] Date of Birth: [birth year]   Important Message Given:  N/A - LOS <3 / Initial given by admissions     Olegario Messier A Cherrish Vitali 03/10/2023, 10:50 AM

## 2023-03-10 NOTE — Progress Notes (Signed)
Physical Therapy Treatment Patient Details Name: Sarah Potts MRN: [id number] DOB: 1932/08/31 Today's Date: 03/10/2023   History of Present Illness Pt is 89 y/o admitted 03/06/23 for a fall. As a result of the fall, pt endured a non-displaced fx of the right tibial tubercle. PmHx includes: CAD, asthma, chronic AFib, and stage 3a CKD.    PT Comments  Pt received in bed, friend/caregiver at bedside. Pt educated on role of PT, importance of progressing mobility, and proper donning of Right knee immobilizer. Pt also educated that KI can be removed for hygiene and skin assessment with good understanding. Pt required Supervision for Supine to sit with HOB raised and use of side rail. Cues to wt shift for scooting forward. Sit to stand from elevated bed to RW with ModA. Gait progressed to 17ft with RW and Min/CGA. Anticipate pt could have ambulated further, will continue to progress. Pt positioned to comfort in chair with all needs in reach. Awaiting STR placement.   If plan is discharge home, recommend the following: Help with stairs or ramp for entrance;Assist for transportation;A lot of help with bathing/dressing/bathroom;Assistance with cooking/housework;A lot of help with walking and/or transfers   Can travel by private vehicle     No  Equipment Recommendations  Other (comment) (TBD at next level of care)    Recommendations for Other Services       Precautions / Restrictions Precautions Precautions: Knee;Fall Required Braces or Orthoses: Knee Immobilizer - Right Restrictions Weight Bearing Restrictions: Yes RLE Weight Bearing: Weight bearing as tolerated     Mobility  Bed Mobility Overal bed mobility: Needs Assistance Bed Mobility: Supine to Sit     Supine to sit: Supervision     General bed mobility comments: Pt insistant to attempt without physical assist    Transfers Overall transfer level: Needs assistance Equipment used: Rolling walker (2 wheels) Transfers: Sit  to/from Stand Sit to Stand: Mod assist, From elevated surface           General transfer comment: able to stand with mod a +1, needed extra time to stand upright.    Ambulation/Gait Ambulation/Gait assistance: Min assist Gait Distance (Feet): 10 Feet Assistive device: Rolling walker (2 wheels) Gait Pattern/deviations: Step-to pattern Gait velocity: decreased     General Gait Details: Pt ambulated with RW in room followed with chair and assist to advance RW forward   Stairs             Wheelchair Mobility     Tilt Bed    Modified Rankin (Stroke Patients Only)       Balance Overall balance assessment: Needs assistance Sitting-balance support: Feet supported Sitting balance-Leahy Scale: Good     Standing balance support: Bilateral upper extremity supported, During functional activity, Reliant on assistive device for balance Standing balance-Leahy Scale: Fair Standing balance comment: heavy reliance on RW for support with small cautious steps                            Cognition Arousal: Alert Behavior During Therapy: WFL for tasks assessed/performed Overall Cognitive Status: Within Functional Limits for tasks assessed                                 General Comments: AOx4. Pt pleasant and willing to participate in PT session.        Exercises General Exercises - Lower Extremity Ankle Circles/Pumps: AROM,  Both, 10 reps, Supine    General Comments General comments (skin integrity, edema, etc.): Right knee immobilizer donned correctly in bed prior to PT session. Pt educated on role of PT and importance of progressing mobility each session      Pertinent Vitals/Pain Pain Assessment Pain Assessment: 0-10 Pain Score: 4  Pain Location: Right knee Pain Descriptors / Indicators: Aching, Discomfort, Guarding Pain Intervention(s): Monitored during session    Home Living                          Prior Function             PT Goals (current goals can now be found in the care plan section) Acute Rehab PT Goals Patient Stated Goal: To go recover and go home Progress towards PT goals: Progressing toward goals    Frequency    7X/week      PT Plan      Co-evaluation              AM-PAC PT "6 Clicks" Mobility   Outcome Measure  Help needed turning from your back to your side while in a flat bed without using bedrails?: A Little Help needed moving from lying on your back to sitting on the side of a flat bed without using bedrails?: A Little Help needed moving to and from a bed to a chair (including a wheelchair)?: A Little Help needed standing up from a chair using your arms (e.g., wheelchair or bedside chair)?: A Lot Help needed to walk in hospital room?: A Little Help needed climbing 3-5 steps with a railing? : Total 6 Click Score: 15    End of Session Equipment Utilized During Treatment: Gait belt Activity Tolerance: Patient tolerated treatment well Patient left: in chair;with call bell/phone within reach;with chair alarm set;with family/visitor present Nurse Communication: Mobility status PT Visit Diagnosis: Unsteadiness on feet (R26.81);Other abnormalities of gait and mobility (R26.89);Muscle weakness (generalized) (M62.81);Difficulty in walking, not elsewhere classified (R26.2);Pain Pain - Right/Left: Right Pain - part of body: Knee     Time: 1100-1129 PT Time Calculation (min) (ACUTE ONLY): 29 min  Charges:    $Gait Training: 8-22 mins $Therapeutic Activity: 8-22 mins PT General Charges $$ ACUTE PT VISIT: 1 Visit                    Zadie Cleverly, PTA  Jannet Askew 03/10/2023, 12:57 PM

## 2023-03-10 NOTE — Progress Notes (Signed)
Progress Note    Sarah Potts  QMV:[id number] DOB: 1932/08/06  DOA: 03/06/2023 PCP: Sarah Mina, MD      Brief Narrative:    Medical records reviewed and are as summarized below:  Sarah Potts is a 89 y.o. female with medical history significant for hypertension, atrial fibrillation, CAD s/p coronary stent, chronic systolic heart failure, type II DM, depression, history of anal cancer s/p colostomy, who presented to the hospital after mechanical fall.  She missed her step on a curb and fell and struck her head. Patient lives at home alone.  She was found to have nondisplaced fracture of right tibial tubercle with bilateral knee abrasions.     Assessment/Plan:   Principal Problem:   Fall Active Problems:   CAD (coronary artery disease)   HTN (hypertension)   Stage 3a chronic kidney disease (HCC)   Atrial fibrillation, chronic (HCC)   Chronic diastolic CHF (congestive heart failure) (HCC)   Asthma   Diabetes mellitus (HCC)   Anemia in stage 3a chronic kidney disease (HCC)   Colostomy in place (HCC)   Body mass index is 34.19 kg/m.  (Obesity)   Acute nondisplaced avulsion of the tibial tuberosity, s/p mechanical fall: He was seen by Dr. Hyacinth Meeker, orthopedic surgeon.  Conservative management and knee immobilizer were recommended. PT recommended discharge to SNF. Outpatient follow-up with Dr. Hyacinth Meeker, orthopedic surgeon, in 2 weeks   AKI on CKD stage IIIb: Creatinine up from 1.55-2.  Patient reports poor oral intake so we will start low rate IV fluids.  Hold losartan.  Monitor BMP.   Elevated liver enzymes: Obtain liver ultrasound for further evaluation. Patient is on amiodarone and statins.  Repeat liver enzymes tomorrow.   Paroxysmal atrial fibrillation: Continue metoprolol, amiodarone and Xarelto   CAD s/p coronary stent: Continue metoprolol, Lipitor and Xarelto   Chronic systolic CHF: Compensated.  Improved EF. 2D echo in January 2024 showed EF  estimated at 60 to 55%, moderate LVH, indeterminate LV diastolic parameters. 2D echo in September 2023 showed EF estimated at 40 to 45%.   Type II DM with hyperglycemia: NovoLog as needed for hyperglycemia.   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 Yes  Diet effective now                            Consultants: Orthopedic surgeon  Procedures: None    Medications:    amiodarone  200 mg Oral Daily   atorvastatin  40 mg Oral q AM   ezetimibe  10 mg Oral q AM   insulin aspart  0-15 Units Subcutaneous TID WC   magnesium oxide  400 mg Oral Daily   metoprolol succinate  50 mg Oral Daily   omega-3 acid ethyl esters  2 g Oral BID   pantoprazole  40 mg Oral Daily   Rivaroxaban  15 mg Oral Q supper   sertraline  50 mg Oral Daily   Continuous Infusions:  lactated ringers 75 mL/hr at 03/10/23 1233     Anti-infectives (From admission, onward)    None              Family Communication/Anticipated D/C date and plan/Code Status   DVT prophylaxis:  Rivaroxaban (XARELTO) tablet 15 mg  Code Status: Full Code  Family Communication: None Disposition Plan: Plan to discharge to SNF   Status is: Inpatient Remains inpatient appropriate because: Awaiting placement to SNF       Subjective:   Interval events noted.  She reports poor oral intake.  Pain in the right knee is better.  Objective:    Vitals:   03/09/23 1613 03/09/23 2342 03/10/23 0500 03/10/23 0826  BP:  103/65  (!) 114/51  Pulse: (!) 54 (!) 57  (!) 57  Resp:  18  14  Temp:  98.6 F (37 C)  98.1 F (36.7 C)  TempSrc:  Oral  Oral  SpO2:  93%  96%  Weight:   84.8 kg   Height:       No data found.  No intake or output data in the 24 hours ending 03/10/23 1311 Filed Weights   03/08/23  0443 03/09/23 0500 03/10/23 0500  Weight: 85 kg 85.3 kg 84.8 kg    Exam:  GEN: NAD SKIN: Warm and dry EYES: No pallor or icterus ENT: MMM CV: RRR PULM: CTA B ABD: soft, obese, NT, +BS, + left lower quadrant colostomy CNS: AAO x 3, non focal EXT: Right knee tenderness.  Immobilizer on right knee.        Data Reviewed:   I have personally reviewed following labs and imaging studies:  Labs: Labs show the following:   Basic Metabolic Panel: Recent Labs  Lab 03/06/23 2241 03/07/23 0428 03/10/23 0526  NA 132* 135  --   K 3.9 4.2  --   CL 100 104  --   CO2 22 22  --   GLUCOSE 257* 197*  --   BUN 39* 40*  --   CREATININE 1.85* 1.55* 2.00*  CALCIUM 8.6* 8.5*  --   MG 1.4*  --   --    GFR Estimated Creatinine Clearance: 19.6 mL/min (A) (by C-G formula based on SCr of 2 mg/dL (H)). Liver Function Tests: Recent Labs  Lab 03/06/23 2241 03/07/23 0428  AST 94* 80*  ALT 64* 58*  ALKPHOS 75 62  BILITOT 0.7 0.7  PROT 6.7 6.6  ALBUMIN 3.5 3.4*   No results for input(s): "LIPASE", "AMYLASE" in the last 168 hours. No results for input(s): "AMMONIA" in the last 168 hours. Coagulation profile No results for input(s): "INR", "PROTIME" in the last 168 hours.  CBC: Recent Labs  Lab 03/06/23 2241 03/07/23 0428 03/10/23 0526  WBC 6.9 7.0 5.5  NEUTROABS 4.9  --   --   HGB 11.5* 11.1* 10.0*  HCT 35.3* 33.1* 29.9*  MCV 97.8 95.4 96.8  PLT 279 253 225   Cardiac Enzymes: Recent Labs  Lab 03/07/23 1217  CKTOTAL 32*   BNP (last 3 results) No results for input(s): "PROBNP" in the last 8760 hours. CBG: Recent Labs  Lab 03/09/23 1138 03/09/23 1712 03/09/23 2111 03/10/23 0736 03/10/23 1157  GLUCAP 238* 205* 238* 173* 176*   D-Dimer: No results for input(s): "DDIMER" in the last 72 hours. Hgb A1c: No results for input(s): "HGBA1C" in the last 72 hours. Lipid Profile: No results for input(s): "CHOL", "HDL", "LDLCALC", "TRIG", "CHOLHDL", "LDLDIRECT" in the  last 72 hours. Thyroid function studies: No results for input(s): "TSH", "T4TOTAL", "T3FREE", "THYROIDAB" in the last 72 hours.  Invalid input(s): "FREET3" Anemia work up: No results for input(s): "VITAMINB12", "FOLATE", "FERRITIN", "TIBC", "IRON", "RETICCTPCT" in the last 72 hours. Sepsis Labs: Recent Labs  Lab 03/06/23 2241 03/07/23 0428 03/10/23 0526  WBC  6.9 7.0 5.5    Microbiology No results found for this or any previous visit (from the past 240 hour(s)).  Procedures and diagnostic studies:  No results found.             LOS: 3 days   Mattea Seger  [facility]   Pager on [email or web address]. If 7PM-7AM, please contact night-coverage at [email or web address]     03/10/2023, 1:11 PM

## 2023-03-10 NOTE — TOC Progression Note (Addendum)
Transition of Care [facility]) - Progression Note    Patient Details  Name: Sarah Potts MRN: [id number] Date of Birth: [birth year]  Transition of Care [facility]) CM/SW Contact  Margarito Liner, LCSW Phone Number: 03/10/2023, 9:33 AM  Clinical Narrative:  SNF insurance authorization still pending.   3:37 pm: Auth approved: [id number]. Next review date 12/2. [facility] can accept patient tomorrow.  4:24 pm: CSW updated patient.  Expected Discharge Plan: Skilled Nursing Facility Barriers to Discharge: Continued Medical Work up  Expected Discharge Plan and Services       Living arrangements for the past 2 months: Single Family Home                                       Social Determinants of Health (SDOH) Interventions SDOH Screenings   Food Insecurity: No Food Insecurity (03/07/2023)  Housing: Patient Declined (03/07/2023)  Transportation Needs: No Transportation Needs (03/07/2023)  Utilities: Not At Risk (03/07/2023)  Tobacco Use: Low Risk  (03/06/2023)    Readmission Risk Interventions     No data to display

## 2023-03-11 ENCOUNTER — Inpatient Hospital Stay: Payer: Medicare HMO

## 2023-03-11 DIAGNOSIS — E1159 Type 2 diabetes mellitus with other circulatory complications: Secondary | ICD-10-CM

## 2023-03-11 DIAGNOSIS — W19XXXA Unspecified fall, initial encounter: Secondary | ICD-10-CM

## 2023-03-11 DIAGNOSIS — N1831 Chronic kidney disease, stage 3a: Secondary | ICD-10-CM

## 2023-03-11 DIAGNOSIS — I10 Essential (primary) hypertension: Secondary | ICD-10-CM

## 2023-03-11 DIAGNOSIS — D631 Anemia in chronic kidney disease: Secondary | ICD-10-CM

## 2023-03-11 DIAGNOSIS — E875 Hyperkalemia: Secondary | ICD-10-CM

## 2023-03-11 DIAGNOSIS — Z933 Colostomy status: Secondary | ICD-10-CM

## 2023-03-11 DIAGNOSIS — I25118 Atherosclerotic heart disease of native coronary artery with other forms of angina pectoris: Secondary | ICD-10-CM

## 2023-03-11 DIAGNOSIS — I5032 Chronic diastolic (congestive) heart failure: Secondary | ICD-10-CM

## 2023-03-11 DIAGNOSIS — I482 Chronic atrial fibrillation, unspecified: Secondary | ICD-10-CM

## 2023-03-11 DIAGNOSIS — R7989 Other specified abnormal findings of blood chemistry: Secondary | ICD-10-CM

## 2023-03-11 LAB — GLUCOSE, CAPILLARY
Glucose-Capillary: 204 mg/dL — ABNORMAL HIGH (ref 70–99)
Glucose-Capillary: 191 mg/dL — ABNORMAL HIGH (ref 70–99)
Glucose-Capillary: 178 mg/dL — ABNORMAL HIGH (ref 70–99)
Glucose-Capillary: 162 mg/dL — ABNORMAL HIGH (ref 70–99)

## 2023-03-11 LAB — COMPREHENSIVE METABOLIC PANEL
ALT: 83 U/L — ABNORMAL HIGH (ref 0–44)
AST: 132 U/L — ABNORMAL HIGH (ref 15–41)
Albumin: 2.9 g/dL — ABNORMAL LOW (ref 3.5–5.0)
Alkaline Phosphatase: 89 U/L (ref 38–126)
Anion gap: 3 — ABNORMAL LOW (ref 5–15)
BUN: 47 mg/dL — ABNORMAL HIGH (ref 8–23)
CO2: 27 mmol/L (ref 22–32)
Calcium: 8.7 mg/dL — ABNORMAL LOW (ref 8.9–10.3)
Chloride: 106 mmol/L (ref 98–111)
Creatinine, Ser: 1.63 mg/dL — ABNORMAL HIGH (ref 0.44–1.00)
GFR, Estimated: 30 mL/min — ABNORMAL LOW (ref >60)
Glucose, Bld: 178 mg/dL — ABNORMAL HIGH (ref 70–99)
Potassium: 5.8 mmol/L — ABNORMAL HIGH (ref 3.5–5.1)
Sodium: 136 mmol/L (ref 135–145)
Total Bilirubin: 1.1 mg/dL (ref <1.2)
Total Protein: 6.1 g/dL — ABNORMAL LOW (ref 6.5–8.1)

## 2023-03-11 LAB — POTASSIUM: Potassium: 5.9 mmol/L — ABNORMAL HIGH (ref 3.5–5.1)

## 2023-03-11 LAB — MAGNESIUM: Magnesium: 1.9 mg/dL (ref 1.7–2.4)

## 2023-03-11 NOTE — Progress Notes (Signed)
Physical Therapy Treatment Patient Details Name: Sarah Potts MRN: [id number] DOB: 09-Sep-1932 Today's Date: 03/11/2023   History of Present Illness Pt is 89 y/o admitted 03/06/23 for a fall. As a result of the fall, pt endured a non-displaced fx of the right tibial tubercle. PmHx includes: CAD, asthma, chronic AFib, and stage 3a CKD.    PT Comments  Pt pre-medicated prior to session. Good demonstration of progression with transfers and gait training. Pt tolerated ~108ft in room with RW, R KI donned, and CGA. No LOB with short slow steps, minimal c/o R LE discomfort. Pt positioned to comfort in recliner with all needs met. Will continue to progress mobility while awaiting STR at [facility] hopefully on Friday once Potassium level decreases.   If plan is discharge home, recommend the following: Help with stairs or ramp for entrance;Assist for transportation;A lot of help with bathing/dressing/bathroom;Assistance with cooking/housework;A lot of help with walking and/or transfers   Can travel by private vehicle     No  Equipment Recommendations  Other (comment) (TBD at next level of care)    Recommendations for Other Services       Precautions / Restrictions Precautions Precautions: Knee;Fall Required Braces or Orthoses: Knee Immobilizer - Right Knee Immobilizer - Right: On at all times;Other (comment) (Off for bathing) Restrictions Weight Bearing Restrictions: Yes RLE Weight Bearing: Weight bearing as tolerated     Mobility  Bed Mobility Overal bed mobility: Needs Assistance Bed Mobility: Supine to Sit     Supine to sit: Supervision Sit to supine: Supervision, HOB elevated, Used rails   General bed mobility comments: Pt insistant to attempt without physical assist    Transfers Overall transfer level: Needs assistance Equipment used: Rolling walker (2 wheels) Transfers: Sit to/from Stand Sit to Stand: Min assist, Mod assist, From elevated surface                 Ambulation/Gait Ambulation/Gait assistance: Min assist Gait Distance (Feet): 20 Feet Assistive device: Rolling walker (2 wheels) Gait Pattern/deviations: Step-to pattern Gait velocity: decreased     General Gait Details: Increased distance tolerated this session   Stairs             Wheelchair Mobility     Tilt Bed    Modified Rankin (Stroke Patients Only)       Balance Overall balance assessment: Needs assistance Sitting-balance support: Feet supported Sitting balance-Leahy Scale: Good     Standing balance support: Bilateral upper extremity supported, During functional activity, Reliant on assistive device for balance Standing balance-Leahy Scale: Fair Standing balance comment: heavy reliance on RW for support with small cautious steps                            Cognition Arousal: Alert Behavior During Therapy: WFL for tasks assessed/performed Overall Cognitive Status: Within Functional Limits for tasks assessed                                 General Comments: AOx4. Pt pleasant and willing to participate in PT session.        Exercises General Exercises - Lower Extremity Ankle Circles/Pumps: AROM, Both, 10 reps, Supine Long Arc Quad: AROM, Left, 10 reps    General Comments General comments (skin integrity, edema, etc.): Removed R LE KI to assess skin without any noteable concerns, educated pt to monitor primarily at posterior proximal and distal areas  Pertinent Vitals/Pain Pain Assessment Pain Assessment: Faces Faces Pain Scale: Hurts little more Pain Location: Right knee Pain Descriptors / Indicators: Aching, Discomfort, Guarding Pain Intervention(s): Limited activity within patient's tolerance    Home Living                          Prior Function            PT Goals (current goals can now be found in the care plan section) Acute Rehab PT Goals Patient Stated Goal: To go recover and go  home Progress towards PT goals: Progressing toward goals    Frequency    7X/week      PT Plan      Co-evaluation              AM-PAC PT "6 Clicks" Mobility   Outcome Measure  Help needed turning from your back to your side while in a flat bed without using bedrails?: A Little Help needed moving from lying on your back to sitting on the side of a flat bed without using bedrails?: A Little Help needed moving to and from a bed to a chair (including a wheelchair)?: A Little Help needed standing up from a chair using your arms (e.g., wheelchair or bedside chair)?: A Lot Help needed to walk in hospital room?: A Little Help needed climbing 3-5 steps with a railing? : Total 6 Click Score: 15    End of Session Equipment Utilized During Treatment: Gait belt Activity Tolerance: Patient tolerated treatment well Patient left: in chair;with call bell/phone within reach;with chair alarm set;with family/visitor present Nurse Communication: Mobility status PT Visit Diagnosis: Unsteadiness on feet (R26.81);Other abnormalities of gait and mobility (R26.89);Muscle weakness (generalized) (M62.81);Difficulty in walking, not elsewhere classified (R26.2);Pain Pain - Right/Left: Right Pain - part of body: Knee     Time: 1100-1131 PT Time Calculation (min) (ACUTE ONLY): 31 min  Charges:    $Gait Training: 8-22 mins $Therapeutic Activity: 8-22 mins PT General Charges $$ ACUTE PT VISIT: 1 Visit                    Zadie Cleverly, PTA  Jannet Askew 03/11/2023, 2:50 PM

## 2023-03-11 NOTE — Plan of Care (Signed)
  Problem: Education: Goal: Knowledge of General Education information will improve Description Including pain rating scale, medication(s)/side effects and non-pharmacologic comfort measures Outcome: Progressing   Problem: Clinical Measurements: Goal: Will remain free from infection Outcome: Progressing   Problem: Activity: Goal: Risk for activity intolerance will decrease Outcome: Progressing   

## 2023-03-11 NOTE — Progress Notes (Signed)
Progress Note   Patient: Sarah Potts ZOX:[id number] DOB: [birth year] DOA: 03/06/2023     4 DOS: the patient was seen and examined on 03/11/2023   Brief hospital course: TEMPLE HAZELIP is a 89 y.o. female with medical history significant for hypertension, atrial fibrillation, CAD s/p coronary stent, chronic systolic heart failure, type II DM, depression, history of anal cancer s/p colostomy, who presented to the hospital after mechanical fall.  She missed her step on a curb and fell and struck her head. Patient lives at home alone.   She was found to have nondisplaced fracture of right tibial tubercle with bilateral knee abrasions.  Assessment and Plan: Acute nondisplaced avulsion of the tibial tuberosity, s/p mechanical fall: Seen by orthopedic surgeon.   Conservative management and knee immobilizer were recommended. PT recommended SNF. She has auth approved for SNF. Outpatient follow-up with Dr. Hyacinth Meeker in 2 weeks   AKI on CKD stage IIIb: Creatinine improved to 1.63.  Continue to hold losartan.  Monitor daily BMP.  Hyperkalemia: Unknown etiology. Repeat potassium ordered. Check magnesium level as she is on Mag-Ox.   Elevated liver enzymes:  RUQ ultrasound unremarkable Hold statins.   Repeat liver enzymes tomorrow.   Paroxysmal atrial fibrillation: Continue metoprolol, amiodarone and Xarelto   CAD s/p coronary stent: Continue metoprolol, Lipitor and Xarelto    Chronic systolic CHF: Compensated.  Improved EF. 2D echo in January 2024 showed EF estimated at 60 to 55%, moderate LVH, indeterminate LV diastolic parameters. 2D echo in September 2023 showed EF estimated at 40 to 45%.    Type II DM with hyperglycemia: NovoLog as needed for hyperglycemia.   Depression Continue Zetia, sertraline therapy.  Hypertension Continue beta-blocker, IV hydralazine as needed  Obesity with BMI 34.8 Diet, exercise and weight reduction advised.      Out of bed to chair. Incentive  spirometry. Nursing supportive care. Fall, aspiration precautions. Plan to discharge her on Friday to skilled nursing facility, once potassium, LFTs normalized.   Code Status: Full Code  Subjective: Patient is seen and examined today morning. Denies abdominal pain, nausea or vomiting. Asks about RUQ uls results. Eating fair.   Physical Exam: Vitals:   03/10/23 1556 03/11/23 0017 03/11/23 0500 03/11/23 0817  BP: (!) 124/43 (!) 109/43  (!) 117/46  Pulse: (!) 58 61  (!) 58  Resp: 14 18  16   Temp: 98.1 F (36.7 C) 98 F (36.7 C)  98.5 F (36.9 C)  TempSrc: Oral   Oral  SpO2: 99% 91%  95%  Weight:   86.3 kg   Height:        General - Elderly Caucasian female, no apparent distress HEENT - PERRLA, EOMI, atraumatic head, non tender sinuses. Lung - Clear, basal rales, no rhonchi, wheezes. Heart - S1, S2 heard, no murmurs, rubs, trace pedal edema. Abdomen - Soft, non tender, bowel sounds good Neuro - Alert, awake and oriented x 3, non focal exam. Skin - Warm and dry.  Data Reviewed:      Latest Ref Rng & Units 03/10/2023    5:26 AM 03/07/2023    4:28 AM 03/06/2023   10:41 PM  CBC  WBC 4.0 - 10.5 K/uL 5.5  7.0  6.9   Hemoglobin 12.0 - 15.0 g/dL 81.1  91.4  78.2   Hematocrit 36.0 - 46.0 % 29.9  33.1  35.3   Platelets 150 - 400 K/uL 225  253  279       Latest Ref Rng & Units 03/11/2023  5:32 AM 03/10/2023    5:26 AM 03/07/2023    4:28 AM  BMP  Glucose 70 - 99 mg/dL 161   096   BUN 8 - 23 mg/dL 47   40   Creatinine 0.45 - 1.00 mg/dL 4.09  8.11  9.14   Sodium 135 - 145 mmol/L 136   135   Potassium 3.5 - 5.1 mmol/L 5.8   4.2   Chloride 98 - 111 mmol/L 106   104   CO2 22 - 32 mmol/L 27   22   Calcium 8.9 - 10.3 mg/dL 8.7   8.5    US Abdomen Limited RUQ (LIVER/GB)  Result Date: 03/11/2023 CLINICAL DATA:  Elevated liver enzymes EXAM: ULTRASOUND ABDOMEN LIMITED RIGHT UPPER QUADRANT COMPARISON:  MRI 01/07/2021. FINDINGS: Gallbladder: Surgically absent as per history  Common bile duct: Diameter: 11 mm. Within normal limits for patient's age in the post cholecystectomy state Liver: No focal lesion identified. Within normal limits in parenchymal echogenicity. Portal vein is patent on color Doppler imaging with normal direction of blood flow towards the liver. Other: None. IMPRESSION: Previous cholecystectomy. Slight ectasia of the common duct but within normal limits for the patient's age in the post cholecystectomy state. No significant intrahepatic biliary ductal dilatation seen by ultrasound Electronically Signed   By: Karen Kays M.D.   On: 03/11/2023 11:55     Family Communication: Discussed with patient, she understand and agree. All questions answereed.  Disposition: Status is: Inpatient Remains inpatient appropriate because: hyperkalemia, elevated LFT  Planned Discharge Destination: Skilled nursing facility, as insurance authorization approved, plan to discharge on Friday.     Time spent: 38 minutes  Author: Marcelino Duster, MD 03/11/2023 2:38 PM Secure chat 7am to 7pm For on call review [email or web address].

## 2023-03-11 NOTE — TOC Progression Note (Addendum)
Transition of Care [facility]) - Progression Note    Patient Details  Name: Sarah Potts MRN: [id number] Date of Birth: 01-26-1933  Transition of [facility]) CM/SW Contact  Garret Reddish, RN Phone Number: 03/11/2023, 2:25 PM  Clinical Narrative:    Chart reviewed.  Noted that patient's Potassium level is elevated today. I have spoken with Tiffany, Admission Coordinator and she informs me that she will not have any beds till next week.  I have informed patient and she would like to go to [facility].    Patient will not be able to discharge today to [facility] nursing home due to high Potassium level    I have informed, French Ana, with [facility] admission.    French Ana reports that their facility will not be accepting patients on Thursday.  French Ana reports that they can accept patient on Friday.  French Ana reports that the [facility] will need to call main number and ask to speak to Ladonna Snide on Friday to make Sarah Potts aware that patient will be a discharge to for Friday.  Main number is [phone].  TOC will follow for discharge planning.      Expected Discharge Plan: Skilled Nursing Facility Barriers to Discharge: Continued Medical Work up  Expected Discharge Plan and Services       Living arrangements for the past 2 months: Single Family Home                                       Social Determinants of Health (SDOH) Interventions SDOH Screenings   Food Insecurity: No Food Insecurity (03/07/2023)  Housing: Patient Declined (03/07/2023)  Transportation Needs: No Transportation Needs (03/07/2023)  Utilities: Not At Risk (03/07/2023)  Tobacco Use: Low Risk  (03/06/2023)    Readmission Risk Interventions     No data to display

## 2023-03-11 NOTE — Care Management Important Message (Signed)
Important Message  Patient Details  Name: Sarah Potts MRN: [id number] Date of Birth: [birth year]   Important Message Given:  Yes - Medicare IM     Olegario Messier A Jamai Dolce 03/11/2023, 11:07 AM

## 2023-03-12 DIAGNOSIS — I25118 Atherosclerotic heart disease of native coronary artery with other forms of angina pectoris: Secondary | ICD-10-CM | POA: Diagnosis not present

## 2023-03-12 DIAGNOSIS — W19XXXA Unspecified fall, initial encounter: Secondary | ICD-10-CM | POA: Diagnosis not present

## 2023-03-12 DIAGNOSIS — N1831 Chronic kidney disease, stage 3a: Secondary | ICD-10-CM | POA: Diagnosis not present

## 2023-03-12 DIAGNOSIS — I10 Essential (primary) hypertension: Secondary | ICD-10-CM | POA: Diagnosis not present

## 2023-03-12 LAB — CBC
HCT: 30.3 % — ABNORMAL LOW (ref 36.0–46.0)
Hemoglobin: 10.1 g/dL — ABNORMAL LOW (ref 12.0–15.0)
MCH: 31.6 pg (ref 26.0–34.0)
MCHC: 33.3 g/dL (ref 30.0–36.0)
MCV: 94.7 fL (ref 80.0–100.0)
Platelets: 233 10*3/uL (ref 150–400)
RBC: 3.2 MIL/uL — ABNORMAL LOW (ref 3.87–5.11)
RDW: 13.6 % (ref 11.5–15.5)
WBC: 4.2 10*3/uL (ref 4.0–10.5)
nRBC: 0 % (ref 0.0–0.2)

## 2023-03-12 LAB — COMPREHENSIVE METABOLIC PANEL
ALT: 64 U/L — ABNORMAL HIGH (ref 0–44)
AST: 80 U/L — ABNORMAL HIGH (ref 15–41)
Albumin: 2.9 g/dL — ABNORMAL LOW (ref 3.5–5.0)
Alkaline Phosphatase: 93 U/L (ref 38–126)
Anion gap: 9 (ref 5–15)
BUN: 37 mg/dL — ABNORMAL HIGH (ref 8–23)
CO2: 23 mmol/L (ref 22–32)
Calcium: 9 mg/dL (ref 8.9–10.3)
Chloride: 104 mmol/L (ref 98–111)
Creatinine, Ser: 1.45 mg/dL — ABNORMAL HIGH (ref 0.44–1.00)
GFR, Estimated: 35 mL/min — ABNORMAL LOW (ref >60)
Glucose, Bld: 173 mg/dL — ABNORMAL HIGH (ref 70–99)
Potassium: 4.9 mmol/L (ref 3.5–5.1)
Sodium: 136 mmol/L (ref 135–145)
Total Bilirubin: 0.8 mg/dL (ref <1.2)
Total Protein: 6 g/dL — ABNORMAL LOW (ref 6.5–8.1)

## 2023-03-12 LAB — GLUCOSE, CAPILLARY
Glucose-Capillary: 199 mg/dL — ABNORMAL HIGH (ref 70–99)
Glucose-Capillary: 194 mg/dL — ABNORMAL HIGH (ref 70–99)
Glucose-Capillary: 226 mg/dL — ABNORMAL HIGH (ref 70–99)
Glucose-Capillary: 167 mg/dL — ABNORMAL HIGH (ref 70–99)

## 2023-03-12 NOTE — Plan of Care (Signed)

## 2023-03-12 NOTE — Progress Notes (Signed)
Physical Therapy Treatment Patient Details Name: Sarah Potts MRN: [id number] DOB: 03-01-1933 Today's Date: 03/12/2023   History of Present Illness Pt is 89 y/o admitted 03/06/23 for a fall. As a result of the fall, pt endured a non-displaced fx of the right tibial tubercle. PmHx includes: CAD, asthma, chronic AFib, and stage 3a CKD.    PT Comments  Pt pre-medicated with pain meds for therapy session.  5/10 R knee pain beginning/end of session at rest (pain increased with activity).  During session pt mod assist with transfers and min assist to ambulate 10 feet and then 5 feet x2 with RW use (pt fatigued requiring sitting rest break after 10 feet; pt then requesting to toilet and walked 5 feet to [facility]; pt then walked 5 feet back to recliner).  Pt requiring pacing and rest breaks d/t fatigue.  Will continue to focus on strengthening, endurance, and progressive functional mobility during hospitalization.    If plan is discharge home, recommend the following: Help with stairs or ramp for entrance;Assist for transportation;A lot of help with bathing/dressing/bathroom;Assistance with cooking/housework;A lot of help with walking and/or transfers   Can travel by private vehicle     No  Equipment Recommendations  Other (comment) (TBD at next facility)    Recommendations for Other Services       Precautions / Restrictions Precautions Precautions: Knee;Fall Precaution Comments: LLQ colostomy Required Braces or Orthoses: Knee Immobilizer - Right Knee Immobilizer - Right: On at all times;Other (comment) (off for bathing) Restrictions Weight Bearing Restrictions: Yes RLE Weight Bearing: Weight bearing as tolerated     Mobility  Bed Mobility               General bed mobility comments: Deferred (pt sitting in recliner beginning/end of session)    Transfers Overall transfer level: Needs assistance Equipment used: Rolling walker (2 wheels) Transfers: Sit to/from Stand Sit to Stand:  Mod assist           General transfer comment: mod assist to stand from recliner x1 trial, from chair x1 trial, and from [facility] x1 trial; vc's for UE/LE placement; assist to initiate and come to full stand; assist to control descent sitting    Ambulation/Gait Ambulation/Gait assistance: Min assist Gait Distance (Feet):  (10 feet; 5 feet x2) Assistive device: Rolling walker (2 wheels) Gait Pattern/deviations: Step-to pattern Gait velocity: decreased     General Gait Details: antalgic; decreased stance time R LE; vc's for increasing UE support through RW; vc's for positioning within walker   Stairs             Wheelchair Mobility     Tilt Bed    Modified Rankin (Stroke Patients Only)       Balance Overall balance assessment: Needs assistance Sitting-balance support: No upper extremity supported, Feet supported Sitting balance-Leahy Scale: Good Sitting balance - Comments: steady reaching within BOS     Standing balance-Leahy Scale: Fair Standing balance comment: assist to steady with dynamic standing activities                            Cognition Arousal: Alert Behavior During Therapy: WFL for tasks assessed/performed Overall Cognitive Status: Within Functional Limits for tasks assessed                                          Exercises  General Comments General comments (skin integrity, edema, etc.): R LE KI in place.  Nursing cleared pt for participation in physical therapy.  Pt agreeable to PT session.      Pertinent Vitals/Pain Pain Assessment Pain Assessment: 0-10 Pain Score: 5  Pain Location: Right knee Pain Descriptors / Indicators: Aching, Discomfort, Guarding Pain Intervention(s): Limited activity within patient's tolerance, Monitored during session, Premedicated before session Vitals (HR and SpO2 on room air) stable and WFL throughout treatment session.    Home Living                           Prior Function            PT Goals (current goals can now be found in the care plan section) Acute Rehab PT Goals Patient Stated Goal: To go recover and go home PT Goal Formulation: With patient Time For Goal Achievement: 03/21/23 Potential to Achieve Goals: Good Progress towards PT goals: Progressing toward goals    Frequency    7X/week      PT Plan      Co-evaluation              AM-PAC PT "6 Clicks" Mobility   Outcome Measure  Help needed turning from your back to your side while in a flat bed without using bedrails?: A Little Help needed moving from lying on your back to sitting on the side of a flat bed without using bedrails?: A Little Help needed moving to and from a bed to a chair (including a wheelchair)?: A Little Help needed standing up from a chair using your arms (e.g., wheelchair or bedside chair)?: A Lot Help needed to walk in hospital room?: A Little Help needed climbing 3-5 steps with a railing? : Total 6 Click Score: 15    End of Session Equipment Utilized During Treatment: Gait belt Activity Tolerance: Patient tolerated treatment well Patient left: in chair;with call bell/phone within reach;with chair alarm set Nurse Communication: Mobility status;Precautions PT Visit Diagnosis: Unsteadiness on feet (R26.81);Other abnormalities of gait and mobility (R26.89);Muscle weakness (generalized) (M62.81);Difficulty in walking, not elsewhere classified (R26.2);Pain Pain - Right/Left: Right Pain - part of body: Knee     Time: 1216-1234 PT Time Calculation (min) (ACUTE ONLY): 18 min  Charges:    $Therapeutic Activity: 8-22 mins PT General Charges $$ ACUTE PT VISIT: 1 Visit                    Hendricks Limes, PT 03/12/23, 1:09 PM

## 2023-03-12 NOTE — Progress Notes (Signed)
Progress Note   Patient: Sarah Potts:[id number] DOB: 1932-11-17 DOA: 03/06/2023     5 DOS: the patient was seen and examined on 03/12/2023   Brief hospital course: IVANKA COLESON is a 89 y.o. female with medical history significant for hypertension, atrial fibrillation, CAD s/p coronary stent, chronic systolic heart failure, type II DM, depression, history of anal cancer s/p colostomy, who presented to the hospital after mechanical fall.  She missed her step on a curb and fell and struck her head. Patient lives at home alone.   She was found to have nondisplaced fracture of right tibial tubercle with bilateral knee abrasions.  Assessment and Plan: Acute nondisplaced avulsion of the tibial tuberosity, s/p mechanical fall: Seen by orthopedic surgeon.   Conservative management and knee immobilizer were recommended. PT recommended SNF. She has auth approved for SNF. Outpatient follow-up with Dr. Hyacinth Meeker in 2 weeks   AKI on CKD stage IIIb: Creatinine improved to 1.45.  Continue to hold losartan.  Monitor daily BMP.  Hyperkalemia: Repeat potassium improved to 4.9 Mag ok.   Elevated liver enzymes:  RUQ ultrasound unremarkable Hold statins.   Repeat liver enzymes better.   Paroxysmal atrial fibrillation:  Continue metoprolol, amiodarone and Xarelto   CAD s/p coronary stent: Continue metoprolol, Lipitor and Xarelto    Chronic systolic CHF: Compensated.  Improved EF. 2D echo in January 2024 showed EF estimated at 60 to 55%, moderate LVH, indeterminate LV diastolic parameters. 2D echo in September 2023 showed EF estimated at 40 to 45%.    Type II DM with hyperglycemia: NovoLog as needed for hyperglycemia.   Depression Continue Zetia, sertraline therapy.  Hypertension Continue beta-blocker, IV hydralazine as needed  Obesity with BMI 34.8 Diet, exercise and weight reduction advised.    Out of bed to chair. Incentive spirometry. Nursing supportive care. Fall, aspiration  precautions. Plan to discharge her on Friday to skilled nursing facility, once potassium, LFTs normalized.   Code Status: Full Code  Subjective: Patient is seen and examined today morning. Denies abdominal pain, nausea or vomiting. Feels better. No complaints.   Physical Exam: Vitals:   03/11/23 1548 03/12/23 0036 03/12/23 0500 03/12/23 0745  BP: (!) 102/42 (!) 139/48  (!) 159/54  Pulse: (!) 56 (!) 59  (!) 59  Resp: 16 18  16   Temp: 98.1 F (36.7 C) 98.1 F (36.7 C)  97.9 F (36.6 C)  TempSrc:      SpO2: 94% 95%  94%  Weight:   84.6 kg   Height:        General - Elderly Caucasian female, no apparent distress HEENT - PERRLA, EOMI, atraumatic head, non tender sinuses. Lung - Clear, basal rales, no rhonchi, wheezes. Heart - S1, S2 heard, no murmurs, rubs, trace pedal edema. Abdomen - Soft, non tender, bowel sounds good Neuro - Alert, awake and oriented x 3, non focal exam. Skin - Warm and dry.  Data Reviewed:      Latest Ref Rng & Units 03/12/2023    4:20 AM 03/10/2023    5:26 AM 03/07/2023    4:28 AM  CBC  WBC 4.0 - 10.5 K/uL 4.2  5.5  7.0   Hemoglobin 12.0 - 15.0 g/dL 81.1  91.4  78.2   Hematocrit 36.0 - 46.0 % 30.3  29.9  33.1   Platelets 150 - 400 K/uL 233  225  253       Latest Ref Rng & Units 03/12/2023    4:20 AM 03/11/2023  5:32 AM 03/10/2023    5:26 AM  BMP  Glucose 70 - 99 mg/dL 578  469    BUN 8 - 23 mg/dL 37  47    Creatinine 6.29 - 1.00 mg/dL 5.28  4.13  2.44   Sodium 135 - 145 mmol/L 136  136    Potassium 3.5 - 5.1 mmol/L 4.9  5.8    5.9    Chloride 98 - 111 mmol/L 104  106    CO2 22 - 32 mmol/L 23  27    Calcium 8.9 - 10.3 mg/dL 9.0  8.7     US Abdomen Limited RUQ (LIVER/GB)  Result Date: 03/11/2023 CLINICAL DATA:  Elevated liver enzymes EXAM: ULTRASOUND ABDOMEN LIMITED RIGHT UPPER QUADRANT COMPARISON:  MRI 01/07/2021. FINDINGS: Gallbladder: Surgically absent as per history Common bile duct: Diameter: 11 mm. Within normal limits for  patient's age in the post cholecystectomy state Liver: No focal lesion identified. Within normal limits in parenchymal echogenicity. Portal vein is patent on color Doppler imaging with normal direction of blood flow towards the liver. Other: None. IMPRESSION: Previous cholecystectomy. Slight ectasia of the common duct but within normal limits for the patient's age in the post cholecystectomy state. No significant intrahepatic biliary ductal dilatation seen by ultrasound Electronically Signed   By: Karen Kays M.D.   On: 03/11/2023 11:55     Family Communication: Discussed with patient, she understand and agree. All questions answereed.  Disposition: Status is: Inpatient Remains inpatient appropriate because: Safe dc plan  Planned Discharge Destination: Skilled nursing facility, as insurance authorization approved, plan to discharge on Friday.     Time spent: 36 minutes  Author: Marcelino Duster, MD 03/12/2023 2:19 PM Secure chat 7am to 7pm For on call review [email or web address].

## 2023-03-12 NOTE — Plan of Care (Signed)

## 2023-03-13 DIAGNOSIS — I482 Chronic atrial fibrillation, unspecified: Secondary | ICD-10-CM | POA: Diagnosis not present

## 2023-03-13 DIAGNOSIS — I25118 Atherosclerotic heart disease of native coronary artery with other forms of angina pectoris: Secondary | ICD-10-CM | POA: Diagnosis not present

## 2023-03-13 DIAGNOSIS — N1831 Chronic kidney disease, stage 3a: Secondary | ICD-10-CM | POA: Diagnosis not present

## 2023-03-13 DIAGNOSIS — S82201A Unspecified fracture of shaft of right tibia, initial encounter for closed fracture: Secondary | ICD-10-CM | POA: Insufficient documentation

## 2023-03-13 DIAGNOSIS — S82154A Nondisplaced fracture of right tibial tuberosity, initial encounter for closed fracture: Secondary | ICD-10-CM

## 2023-03-13 DIAGNOSIS — W19XXXA Unspecified fall, initial encounter: Secondary | ICD-10-CM | POA: Diagnosis not present

## 2023-03-13 LAB — GLUCOSE, CAPILLARY
Glucose-Capillary: 203 mg/dL — ABNORMAL HIGH (ref 70–99)
Glucose-Capillary: 179 mg/dL — ABNORMAL HIGH (ref 70–99)

## 2023-03-13 NOTE — Discharge Summary (Signed)
Physician Discharge Summary   Patient: Sarah Potts MRN: [id number] DOB: 1933/04/05  Admit date:     03/06/2023  Discharge date: 03/13/23  Discharge Physician: Marcelino Duster   PCP: Jerl Mina, MD   Recommendations at discharge:    PCP follow up in 1 week. Hold statin, losartan due to kidney and liver dysfunction. Talk to PCP regarding restarting with repeat CMP.  Discharge Diagnoses: Principal Problem:   Fall Active Problems:   CAD (coronary artery disease)   HTN (hypertension)   Stage 3a chronic kidney disease (HCC)   Atrial fibrillation, chronic (HCC)   Chronic diastolic CHF (congestive heart failure) (HCC)   Asthma   Diabetes mellitus (HCC)   Anemia in stage 3a chronic kidney disease (HCC)   Colostomy in place [facility])   Closed right tibial fracture  Resolved Problems:   * No resolved hospital problems. *  Hospital Course: Sarah Potts is a 89 y.o. female with medical history significant for hypertension, atrial fibrillation, CAD s/p coronary stent, chronic systolic heart failure, type II DM, depression, history of anal cancer s/p colostomy, who presented to the hospital after mechanical fall.  She missed her step on a curb and fell and struck her head. Patient lives at home alone.   She was found to have nondisplaced fracture of right tibial tubercle with bilateral knee abrasions.  Patient is admitted to the hospitalist service for further management evaluation of mechanical fall, acute nondisplaced avulsion of right tibial tuberosity.  Patient is seen by orthopedic surgeon who recommended conservative management, knee immobilizer.  Her kidney function worsened from her baseline, losartan held.  Liver functions elevated, RUQ ultrasound unremarkable, statins held.  Patient's blood sugars closely monitored, insulin adjusted accordingly.  PT evaluated patient recommended skilled nursing facility placement.  She is hemodynamically stable to be discharged to SNF.   Advised her to follow-up with primary care physician, orthopedic surgery clinic upon discharge as instructed.  She understands and agrees with discharge plan.  Assessment and Plan: Acute nondisplaced avulsion of right tibial tuberosity,  s/p mechanical fall: Seen by orthopedic surgeon.   Conservative management and knee immobilizer were recommended. Outpatient follow-up with Dr. Hyacinth Meeker in 2 weeks   AKI on CKD stage IIIb:  Creatinine improved to 1.45.  Continue to hold losartan.   Monitor CMP as outpatient to resume her ARB.   Hyperkalemia: Repeat potassium improved to 4.9 Mag ok.   Elevated liver enzymes:  RUQ ultrasound unremarkable. Repeat liver enzymes better. Hold statins and follow-up with PCP for repeat CMP  Paroxysmal atrial fibrillation:  Continue metoprolol, amiodarone and Xarelto   CAD s/p coronary stent:  Continue metoprolol, Lipitor and Xarelto    Chronic systolic CHF:  2D echo in January 2024 showed EF estimated at 60 to 55%, moderate LVH, indeterminate LV diastolic parameters. No acute exacerbation. CHF education provided.    Type II DM with hyperglycemia:  A1c 7.8, sugars stable. She was placed on NovoLog as needed for hyperglycemia. Home meds to be resumed upon discharge.   Depression Continue Zetia, sertraline therapy.   Hypertension Continue beta-blocker therapy.   Obesity with BMI 34.8 Diet, exercise and weight reduction advised.  Anal cancer s/p colostomy Continue colostomy care.      Consultants: Orthopedics Procedures performed: none  Disposition: Skilled nursing facility Diet recommendation:  Discharge Diet Orders (From admission, onward)     Start     Ordered   03/13/23 0000  Diet - low sodium heart healthy  03/13/23 1133   03/13/23 0000  Diet Carb Modified        03/13/23 1133           Cardiac and Carb modified diet DISCHARGE MEDICATION: Allergies as of 03/13/2023       Reactions   Levofloxacin Nausea Only    Patient reports that she thinks that she could take this medication. Patient reports that she thinks that she could take this medication.     Patient reports that she thinks that she could take this medication.   Niacin Other (See Comments)   Hot, Flushed Pt states she get real red and hot.    Hot, Flushed   Niacin And Related Other (See Comments)   Pt states she get real red and hot.        Medication List     STOP taking these medications    atorvastatin 40 MG tablet Commonly known as: LIPITOR   CELEXA PO   losartan 100 MG tablet Commonly known as: COZAAR   mirabegron ER 50 MG Tb24 tablet Commonly known as: MYRBETRIQ   nystatin powder Commonly known as: MYCOSTATIN/NYSTOP   pregabalin 150 MG capsule Commonly known as: LYRICA   PRILOSEC PO       TAKE these medications    acetaminophen 500 MG tablet Commonly known as: TYLENOL Take 500 mg by mouth every 4 (four) hours as needed.   albuterol 108 (90 Base) MCG/ACT inhaler Commonly known as: VENTOLIN HFA Inhale 1 puff into the lungs every 4 (four) hours as needed.   amiodarone 200 MG tablet Commonly known as: PACERONE Take 1 tablet (200 mg total) by mouth daily.   Cholecalciferol 25 MCG (1000 UT) tablet Take 4,000 Units by mouth daily. Pt takes 3 daily   Cyanocobalamin 1000 MCG/ML Kit Inject into the muscle every 30 (thirty) days.   ezetimibe 10 MG tablet Commonly known as: ZETIA TAKE 1 TABLET (10 MG TOTAL) BY MOUTH IN THE MORNING. FOR CHOLESTEROL   fexofenadine 60 MG tablet Commonly known as: ALLEGRA Take 60 mg by mouth daily.   Fish Oil 1000 MG Caps Take 1,000 mg by mouth daily.   magnesium oxide 400 MG tablet Commonly known as: MAG-OX Take 400 mg by mouth daily.   meclizine 25 MG tablet Commonly known as: ANTIVERT Take 25 mg by mouth 2 (two) times daily as needed.   metoprolol succinate 50 MG 24 hr tablet Commonly known as: TOPROL-XL Take 1 tablet (50 mg total) by mouth daily.    montelukast 10 MG tablet Commonly known as: SINGULAIR Take 10 mg by mouth at bedtime.   nitroGLYCERIN 0.4 MG SL tablet Commonly known as: NITROSTAT Place 1 tablet (0.4 mg total) under the tongue every 5 (five) minutes as needed for chest pain.   omega-3 acid ethyl esters 1 g capsule Commonly known as: LOVAZA Take 2 g by mouth 2 (two) times daily.   omeprazole 40 MG capsule Commonly known as: PRILOSEC Take 40 mg by mouth daily.   pioglitazone 30 MG tablet Commonly known as: ACTOS Take 30 mg by mouth daily.   sertraline 50 MG tablet Commonly known as: ZOLOFT Take 50 mg by mouth daily.   Trulicity 1.5 MG/0.5ML Soaj Generic drug: Dulaglutide Inject into the skin once a week.   Xarelto 15 MG Tabs tablet Generic drug: Rivaroxaban TAKE 1 TABLET (15 MG TOTAL) BY MOUTH DAILY.        Contact information for follow-up providers     Deeann Saint, MD Follow  up in 2 week(s).   Specialty: Orthopedic Surgery Why: For re-evaluation Please make appointment for this patient prior to her discharge from the hospital Contact information: [address] [place] [address] [phone]         Jerl Mina, MD Follow up in 1 week(s).   Specialty: Family Medicine Contact information: [address] [place] [address] [phone]              Contact information for after-discharge care     Destination     [facility] [place] Preferred SNF .   Service: Skilled Nursing Contact information: [address] [place] [address] [phone]                    Discharge Exam: Ceasar Mons Weights   03/11/23 0500 03/12/23 0500 03/13/23 0343  Weight: 86.3 kg 84.6 kg 82.8 kg   General - Elderly Caucasian female, no apparent distress HEENT - PERRLA, EOMI, atraumatic head, non tender sinuses. Lung - Clear, basal rales, no rhonchi, wheezes. Heart - S1, S2 heard, no murmurs, rubs, trace pedal edema. Abdomen - Soft,  non tender, colostomy bag intact Neuro - Alert, awake and oriented x 3, non focal exam. Skin - Warm and dry.  Condition at discharge: stable  The results of significant diagnostics from this hospitalization (including imaging, microbiology, ancillary and laboratory) are listed below for reference.   Imaging Studies: US Abdomen Limited RUQ (LIVER/GB)  Result Date: 03/11/2023 CLINICAL DATA:  Elevated liver enzymes EXAM: ULTRASOUND ABDOMEN LIMITED RIGHT UPPER QUADRANT COMPARISON:  MRI 01/07/2021. FINDINGS: Gallbladder: Surgically absent as per history Common bile duct: Diameter: 11 mm. Within normal limits for patient's age in the post cholecystectomy state Liver: No focal lesion identified. Within normal limits in parenchymal echogenicity. Portal vein is patent on color Doppler imaging with normal direction of blood flow towards the liver. Other: None. IMPRESSION: Previous cholecystectomy. Slight ectasia of the common duct but within normal limits for the patient's age in the post cholecystectomy state. No significant intrahepatic biliary ductal dilatation seen by ultrasound Electronically Signed   By: Karen Kays M.D.   On: 03/11/2023 11:55   DG Hips Bilat W or Wo Pelvis 3-4 Views  Result Date: 03/06/2023 CLINICAL DATA:  Fall EXAM: DG HIP (WITH OR WITHOUT PELVIS) 3-4V BILAT COMPARISON:  None Available. FINDINGS: SI joints are non widened. Pubic symphysis and rami appear intact. No fracture or malalignment. Mild bilateral hip degenerative change. IMPRESSION: No acute osseous abnormality. Electronically Signed   By: Jasmine Pang M.D.   On: 03/06/2023 20:29   DG Knee Complete 4 Views Left  Result Date: 03/06/2023 CLINICAL DATA:  Fall with knee swelling and pain EXAM: LEFT KNEE - COMPLETE 4+ VIEW COMPARISON:  None Available. FINDINGS: No acute fracture or malalignment. Moderate tricompartment arthritis of the knee. Small knee effusion. Chondrocalcinosis. IMPRESSION: No acute osseous abnormality.  Moderate tricompartment arthritis with small effusion. Chondrocalcinosis. Electronically Signed   By: Jasmine Pang M.D.   On: 03/06/2023 20:24   DG Knee Complete 4 Views Right  Result Date: 03/06/2023 CLINICAL DATA:  Fall EXAM: RIGHT KNEE - COMPLETE 4+ VIEW COMPARISON:  None Available. FINDINGS: Acute appearing avulsion at the tibial tuberosity with overlying copious soft tissue swelling. No dislocation. Small knee effusion. Probable calcified loose body at the posterior knee. Chondrocalcinosis. Moderate tricompartment arthritis. IMPRESSION: 1. Acute appearing avulsion at the tibial tuberosity with overlying soft tissue swelling. 2. Moderate tricompartment arthritis with chondrocalcinosis and probable calcified  loose body at the posterior knee. Electronically Signed   By: Jasmine Pang M.D.   On: 03/06/2023 20:23   CT Head Wo Contrast  Result Date: 03/06/2023 CLINICAL DATA:  Trauma, fall, facial injury EXAM: CT HEAD WITHOUT CONTRAST CT MAXILLOFACIAL WITHOUT CONTRAST CT CERVICAL SPINE WITHOUT CONTRAST TECHNIQUE: Multidetector CT imaging of the head, cervical spine, and maxillofacial structures were performed using the standard protocol without intravenous contrast. Multiplanar CT image reconstructions of the cervical spine and maxillofacial structures were also generated. RADIATION DOSE REDUCTION: This exam was performed according to the departmental dose-optimization program which includes automated exposure control, adjustment of the mA and/or kV according to patient size and/or use of iterative reconstruction technique. COMPARISON:  12/30/2022 FINDINGS: CT HEAD FINDINGS Brain: No evidence of acute infarction, hemorrhage, hydrocephalus, extra-axial collection or mass lesion/mass effect. Vascular: No hyperdense vessel or unexpected calcification. CT FACIAL BONES FINDINGS Skull: Normal. Negative for fracture or focal lesion. Facial bones: No displaced fractures or dislocations. Sinuses/Orbits: No acute  finding. Other: Soft tissue contusion of the right brow (series 2, image 74). Patient is edentulous. CT CERVICAL SPINE FINDINGS Alignment: Degenerative straightening of the normal cervical lordosis. Skull base and vertebrae: No acute fracture. No primary bone lesion or focal pathologic process. Soft tissues and spinal canal: No prevertebral fluid or swelling. No visible canal hematoma. Disc levels: Moderate disc space height loss and osteophytosis, worst at C5-C7. Upper chest: Negative. Other: None. IMPRESSION: 1. No acute intracranial pathology. 2. No displaced fractures or dislocations of the facial bones. 3. Soft tissue contusion of the right brow. 4. No fracture or static subluxation of the cervical spine. 5. Moderate cervical disc degenerative disease. Electronically Signed   By: Jearld Lesch M.D.   On: 03/06/2023 20:22   CT Cervical Spine Wo Contrast  Result Date: 03/06/2023 CLINICAL DATA:  Trauma, fall, facial injury EXAM: CT HEAD WITHOUT CONTRAST CT MAXILLOFACIAL WITHOUT CONTRAST CT CERVICAL SPINE WITHOUT CONTRAST TECHNIQUE: Multidetector CT imaging of the head, cervical spine, and maxillofacial structures were performed using the standard protocol without intravenous contrast. Multiplanar CT image reconstructions of the cervical spine and maxillofacial structures were also generated. RADIATION DOSE REDUCTION: This exam was performed according to the departmental dose-optimization program which includes automated exposure control, adjustment of the mA and/or kV according to patient size and/or use of iterative reconstruction technique. COMPARISON:  12/30/2022 FINDINGS: CT HEAD FINDINGS Brain: No evidence of acute infarction, hemorrhage, hydrocephalus, extra-axial collection or mass lesion/mass effect. Vascular: No hyperdense vessel or unexpected calcification. CT FACIAL BONES FINDINGS Skull: Normal. Negative for fracture or focal lesion. Facial bones: No displaced fractures or dislocations.  Sinuses/Orbits: No acute finding. Other: Soft tissue contusion of the right brow (series 2, image 74). Patient is edentulous. CT CERVICAL SPINE FINDINGS Alignment: Degenerative straightening of the normal cervical lordosis. Skull base and vertebrae: No acute fracture. No primary bone lesion or focal pathologic process. Soft tissues and spinal canal: No prevertebral fluid or swelling. No visible canal hematoma. Disc levels: Moderate disc space height loss and osteophytosis, worst at C5-C7. Upper chest: Negative. Other: None. IMPRESSION: 1. No acute intracranial pathology. 2. No displaced fractures or dislocations of the facial bones. 3. Soft tissue contusion of the right brow. 4. No fracture or static subluxation of the cervical spine. 5. Moderate cervical disc degenerative disease. Electronically Signed   By: Jearld Lesch M.D.   On: 03/06/2023 20:22   CT Maxillofacial Wo Contrast  Result Date: 03/06/2023 CLINICAL DATA:  Trauma, fall, facial injury EXAM: CT HEAD WITHOUT  CONTRAST CT MAXILLOFACIAL WITHOUT CONTRAST CT CERVICAL SPINE WITHOUT CONTRAST TECHNIQUE: Multidetector CT imaging of the head, cervical spine, and maxillofacial structures were performed using the standard protocol without intravenous contrast. Multiplanar CT image reconstructions of the cervical spine and maxillofacial structures were also generated. RADIATION DOSE REDUCTION: This exam was performed according to the departmental dose-optimization program which includes automated exposure control, adjustment of the mA and/or kV according to patient size and/or use of iterative reconstruction technique. COMPARISON:  12/30/2022 FINDINGS: CT HEAD FINDINGS Brain: No evidence of acute infarction, hemorrhage, hydrocephalus, extra-axial collection or mass lesion/mass effect. Vascular: No hyperdense vessel or unexpected calcification. CT FACIAL BONES FINDINGS Skull: Normal. Negative for fracture or focal lesion. Facial bones: No displaced fractures or  dislocations. Sinuses/Orbits: No acute finding. Other: Soft tissue contusion of the right brow (series 2, image 74). Patient is edentulous. CT CERVICAL SPINE FINDINGS Alignment: Degenerative straightening of the normal cervical lordosis. Skull base and vertebrae: No acute fracture. No primary bone lesion or focal pathologic process. Soft tissues and spinal canal: No prevertebral fluid or swelling. No visible canal hematoma. Disc levels: Moderate disc space height loss and osteophytosis, worst at C5-C7. Upper chest: Negative. Other: None. IMPRESSION: 1. No acute intracranial pathology. 2. No displaced fractures or dislocations of the facial bones. 3. Soft tissue contusion of the right brow. 4. No fracture or static subluxation of the cervical spine. 5. Moderate cervical disc degenerative disease. Electronically Signed   By: Jearld Lesch M.D.   On: 03/06/2023 20:22    Microbiology: Results for orders placed or performed during the hospital encounter of 12/20/21  Urine Culture     Status: None   Collection Time: 12/20/21  5:37 PM   Specimen: Urine, Suprapubic  Result Value Ref Range Status   Specimen Description   Final    URINE, SUPRAPUBIC Performed at [facility], [address], [place], [place] [address]    Special Requests   Final    NONE Performed at [facility], [address], [place], [place] [address]    Culture   Final    NO GROWTH Performed at [facility], [address], [place], [place] [address]    Report Status 12/22/2021 FINAL  Final    Labs: CBC: Recent Labs  Lab 03/06/23 2241 03/07/23 0428 03/10/23 0526 03/12/23 0420  WBC 6.9 7.0 5.5 4.2  NEUTROABS 4.9  --   --   --   HGB 11.5* 11.1* 10.0* 10.1*  HCT 35.3* 33.1* 29.9* 30.3*  MCV 97.8 95.4 96.8 94.7  PLT 279 253 225 233   Basic Metabolic Panel: Recent Labs  Lab 03/06/23 2241 03/07/23 0428 03/10/23 0526 03/11/23 0532 03/12/23 0420  NA 132* 135  --  136 136  K 3.9 4.2  --  5.9*   5.8* 4.9  CL 100 104  --  106 104  CO2 22 22  --  27 23  GLUCOSE 257* 197*  --  178* 173*  BUN 39* 40*  --  47* 37*  CREATININE 1.85* 1.55* 2.00* 1.63* 1.45*  CALCIUM 8.6* 8.5*  --  8.7* 9.0  MG 1.4*  --   --  1.9  --    Liver Function Tests: Recent Labs  Lab 03/06/23 2241 03/07/23 0428 03/11/23 0532 03/12/23 0420  AST 94* 80* 132* 80*  ALT 64* 58* 83* 64*  ALKPHOS 75 62 89 93  BILITOT 0.7 0.7 1.1 0.8  PROT 6.7 6.6 6.1* 6.0*  ALBUMIN 3.5 3.4* 2.9*  2.9*   CBG: Recent Labs  Lab 03/12/23 1130 03/12/23 1720 03/12/23 2147 03/13/23 0800 03/13/23 1135  GLUCAP 194* 226* 167* 203* 179*    Discharge time spent: 38 minutes.  Signed: Marcelino Duster, MD [facility] 03/13/2023

## 2023-03-13 NOTE — Plan of Care (Signed)

## 2023-03-13 NOTE — Progress Notes (Signed)
Physical Therapy Treatment Patient Details Name: Sarah Potts MRN: [id number] DOB: [birth year] Today's Date: 03/13/2023   History of Present Illness Pt is 89 y/o admitted 03/06/23 for a fall. As a result of the fall, pt endured a non-displaced fx of the right tibial tubercle. PmHx includes: CAD, asthma, chronic AFib, and stage 3a CKD.    PT Comments  Pt resting in bed upon PT arrival; agreeable to therapy.  5/10 R knee pain beginning/end of session at rest (pain increased with walking); pt pre-medicated with pain medication for therapy session.  During session pt mod assist semi-supine to sitting EOB; max assist to stand from bed; and CGA to ambulate 20 feet with RW use (limited distance d/t pt fatigue).  Will continue to focus on strengthening and progressive functional mobility during hospitalization.    If plan is discharge home, recommend the following: Help with stairs or ramp for entrance;Assist for transportation;A lot of help with bathing/dressing/bathroom;Assistance with cooking/housework;A lot of help with walking and/or transfers   Can travel by private vehicle     No  Equipment Recommendations  Other (comment) (TBD at next facility)    Recommendations for Other Services       Precautions / Restrictions Precautions Precautions: Knee;Fall Precaution Comments: LLQ colostomy Required Braces or Orthoses: Knee Immobilizer - Right Knee Immobilizer - Right: On at all times;Other (comment) (off for bathing) Restrictions Weight Bearing Restrictions: Yes RLE Weight Bearing: Weight bearing as tolerated     Mobility  Bed Mobility Overal bed mobility: Needs Assistance Bed Mobility: Supine to Sit     Supine to sit: Mod assist     General bed mobility comments: assist for trunk; vc's for technique    Transfers Overall transfer level: Needs assistance Equipment used: Rolling walker (2 wheels) Transfers: Sit to/from Stand Sit to Stand: Max assist           General  transfer comment: assist to initiate and come to full stand; assist to control descent sitting; vc's for UE/LE positioning    Ambulation/Gait Ambulation/Gait assistance: Contact guard assist Gait Distance (Feet): 20 Feet Assistive device: Rolling walker (2 wheels) Gait Pattern/deviations: Step-to pattern Gait velocity: decreased     General Gait Details: antalgic; decreased stance time R LE; vc's for increasing UE support through [employer] Mobility     Tilt Bed    Modified Rankin (Stroke Patients Only)       Balance Overall balance assessment: Needs assistance Sitting-balance support: No upper extremity supported, Feet supported Sitting balance-Leahy Scale: Good Sitting balance - Comments: steady reaching within BOS   Standing balance support: Bilateral upper extremity supported, During functional activity, Reliant on assistive device for balance Standing balance-Leahy Scale: Good Standing balance comment: steady ambulating with RW use                            Cognition Arousal: Alert Behavior During Therapy: WFL for tasks assessed/performed Overall Cognitive Status: Within Functional Limits for tasks assessed                                          Exercises      General Comments General comments (skin integrity, edema, etc.): R LE KI in place.  Nursing cleared pt for participation in physical therapy.  Pt agreeable to PT session.      Pertinent Vitals/Pain Pain Assessment Pain Assessment: 0-10 Pain Score: 5  Pain Location: Right knee Pain Descriptors / Indicators: Aching, Discomfort, Guarding Pain Intervention(s): Limited activity within patient's tolerance, Monitored during session, Premedicated before session Vitals (HR and SpO2 on room air) stable and WFL throughout treatment session.    Home Living                          Prior Function            PT Goals (current goals  can now be found in the care plan section) Acute Rehab PT Goals Patient Stated Goal: To go recover and go home PT Goal Formulation: With patient Time For Goal Achievement: 03/21/23 Potential to Achieve Goals: Good Progress towards PT goals: Progressing toward goals    Frequency    7X/week      PT Plan      Co-evaluation              AM-PAC PT "6 Clicks" Mobility   Outcome Measure  Help needed turning from your back to your side while in a flat bed without using bedrails?: A Little Help needed moving from lying on your back to sitting on the side of a flat bed without using bedrails?: A Little Help needed moving to and from a bed to a chair (including a wheelchair)?: A Little Help needed standing up from a chair using your arms (e.g., wheelchair or bedside chair)?: A Lot Help needed to walk in hospital room?: A Little Help needed climbing 3-5 steps with a railing? : Total 6 Click Score: 15    End of Session Equipment Utilized During Treatment: Gait belt;Right knee immobilizer Activity Tolerance: Patient tolerated treatment well Patient left: in chair;with call bell/phone within reach;with chair alarm set Nurse Communication: Mobility status;Precautions PT Visit Diagnosis: Unsteadiness on feet (R26.81);Other abnormalities of gait and mobility (R26.89);Muscle weakness (generalized) (M62.81);Difficulty in walking, not elsewhere classified (R26.2);Pain Pain - Right/Left: Right Pain - part of body: Knee     Time: 9562-1308 PT Time Calculation (min) (ACUTE ONLY): 20 min  Charges:    $Gait Training: 8-22 mins PT General Charges $$ ACUTE PT VISIT: 1 Visit                     Hendricks Limes, PT 03/13/23, 1:19 PM

## 2023-03-13 NOTE — Progress Notes (Signed)
AVS sent in packet and report called to [facility]. Report called to [facility] LPN. Patient transported by EMS.

## 2023-03-13 NOTE — TOC Transition Note (Addendum)
Transition of Care [facility]) - CM/SW Discharge Note   Patient Details  Name: Sarah Potts MRN: [id number] Date of Birth: 01/15/1933  Transition of Care [facility]) CM/SW Contact:  Liliana Cline, LCSW Phone Number: 03/13/2023, 11:24 AM   Clinical Narrative:    Discharge to [facility] today. Room 202. Confirmed with [facility] Representative Willette. Asked RN to call report. EMS paperwork completed. Will call for [facility] when patient is ready.  12:00- Called for [facility] transport. Patient is 6th on the list.   Final next level of care: Skilled Nursing Facility Barriers to Discharge: Barriers Resolved   Patient Goals and CMS Choice CMS Medicare.gov Compare Post Acute Care list provided to:: Patient Choice offered to / list presented to : Patient  Discharge Placement                Patient chooses bed at: [facility] Patient to be transferred to facility by: [facility]      Discharge Plan and Services Additional resources added to the After Visit Summary for                                       Social Determinants of Health (SDOH) Interventions SDOH Screenings   Food Insecurity: No Food Insecurity (03/07/2023)  Housing: Patient Declined (03/07/2023)  Transportation Needs: No Transportation Needs (03/07/2023)  Utilities: Not At Risk (03/07/2023)  Tobacco Use: Low Risk  (03/06/2023)     Readmission Risk Interventions     No data to display

## 2023-04-11 ENCOUNTER — Other Ambulatory Visit: Payer: Self-pay | Admitting: Cardiovascular Disease

## 2023-05-01 ENCOUNTER — Other Ambulatory Visit: Payer: Self-pay

## 2023-05-01 ENCOUNTER — Emergency Department
Admission: EM | Admit: 2023-05-01 | Discharge: 2023-05-01 | Disposition: A | Discharge status: Home / Self Care | Payer: Medicare HMO | Attending: Emergency Medicine | Admitting: Emergency Medicine

## 2023-05-01 ENCOUNTER — Emergency Department: Payer: Medicare HMO

## 2023-05-01 DIAGNOSIS — I2510 Atherosclerotic heart disease of native coronary artery without angina pectoris: Secondary | ICD-10-CM | POA: Insufficient documentation

## 2023-05-01 DIAGNOSIS — R079 Chest pain, unspecified: Secondary | ICD-10-CM | POA: Insufficient documentation

## 2023-05-01 LAB — CBC
HCT: 36.1 % (ref 36.0–46.0)
Hemoglobin: 11.8 g/dL — ABNORMAL LOW (ref 12.0–15.0)
MCH: 32.7 pg (ref 26.0–34.0)
MCHC: 32.7 g/dL (ref 30.0–36.0)
MCV: 100 fL (ref 80.0–100.0)
Platelets: 240 10*3/uL (ref 150–400)
RBC: 3.61 MIL/uL — ABNORMAL LOW (ref 3.87–5.11)
RDW: 15.4 % (ref 11.5–15.5)
WBC: 5.7 10*3/uL (ref 4.0–10.5)
nRBC: 0 % (ref 0.0–0.2)

## 2023-05-01 LAB — TROPONIN I (HIGH SENSITIVITY)
Troponin I (High Sensitivity): 7 ng/L (ref <18)
Troponin I (High Sensitivity): 8 ng/L (ref <18)

## 2023-05-01 LAB — BASIC METABOLIC PANEL
Anion gap: 10 (ref 5–15)
BUN: 26 mg/dL — ABNORMAL HIGH (ref 8–23)
CO2: 20 mmol/L — ABNORMAL LOW (ref 22–32)
Calcium: 8.8 mg/dL — ABNORMAL LOW (ref 8.9–10.3)
Chloride: 108 mmol/L (ref 98–111)
Creatinine, Ser: 1.32 mg/dL — ABNORMAL HIGH (ref 0.44–1.00)
GFR, Estimated: 39 mL/min — ABNORMAL LOW (ref >60)
Glucose, Bld: 117 mg/dL — ABNORMAL HIGH (ref 70–99)
Potassium: 4.6 mmol/L (ref 3.5–5.1)
Sodium: 138 mmol/L (ref 135–145)

## 2023-05-01 MED ORDER — ASPIRIN 81 MG PO CHEW
324.0000 mg | CHEWABLE_TABLET | Freq: Once | ORAL | Status: AC
  Administered 2023-05-01: 324 mg via ORAL
  Filled 2023-05-01: qty 4

## 2023-05-01 NOTE — ED Provider Notes (Signed)
[facility] Provider Note    Event Date/Time   First MD Initiated Contact with Patient 05/01/23 1803     (approximate)   History   Chest Pain   HPI  Sarah Potts is a 89 y.o. female past medical history significant for coronary artery disease who presents to the emergency department following an episode of chest pain.  States that she was working at her house today and she developed an episode of chest pain that radiated down her left arm associated with some nausea.  States that it lasted for short period of time she took some nitroglycerin and it resolved.  Chest pain-free over the past couple of hours.  Denies any ongoing shortness of breath, nausea or vomiting.  No pleuritic pain.  No swelling in her legs.  No history of DVT or PE.  Patient is currently on anticoagulation.  Last cardiac catheterization was in 2023 and did not show any findings of coronary artery disease.  Has not had any stress testing since that time.  Followed by Dr. Mariah Milling with cardiology.     Physical Exam   Triage Vital Signs: ED Triage Vitals  Encounter Vitals Group     BP 05/01/23 1359 (!) 125/56     Systolic BP Percentile --      Diastolic BP Percentile --      Pulse Rate 05/01/23 1359 65     Resp 05/01/23 1359 18     Temp 05/01/23 1359 98.1 F (36.7 C)     Temp Source 05/01/23 1359 Oral     SpO2 05/01/23 1359 95 %     Weight 05/01/23 1358 182 lb 8.7 oz (82.8 kg)     Height 05/01/23 1358 5\' 2"  (1.575 m)     Head Circumference --      Peak Flow --      Pain Score 05/01/23 1357 5     Pain Loc --      Pain Education --      Exclude from Growth Chart --     Most recent vital signs: Vitals:   05/01/23 1914 05/01/23 1930  BP: (!) 147/55 (!) 136/48  Pulse: 63 63  Resp: 18 16  Temp: 98.9 F (37.2 C)   SpO2: 99% 96%    Physical Exam Constitutional:      Appearance: She is well-developed.  HENT:     Head: Atraumatic.  Eyes:     Conjunctiva/sclera: Conjunctivae  normal.  Cardiovascular:     Rate and Rhythm: Regular rhythm.     Heart sounds: Normal heart sounds.  Pulmonary:     Effort: No respiratory distress.  Abdominal:     General: There is no distension.  Musculoskeletal:        General: Normal range of motion.     Cervical back: Normal range of motion.     Right lower leg: No edema.     Left lower leg: No edema.  Skin:    General: Skin is warm.     Capillary Refill: Capillary refill takes less than 2 seconds.  Neurological:     Mental Status: She is alert. Mental status is at baseline.     IMPRESSION / MDM / ASSESSMENT AND PLAN / ED COURSE  I reviewed the triage vital signs and the nursing notes.  Differential diagnosis including ACS, pneumonia, viral illness including COVID/influenza, anemia  On chart review patient had a cardiac catheterization 12/2021 that showed no signs of coronary artery disease,  nothing that had more than 40% stenosis.  PCI was in 2019  EKG  I, Corena Herter, the attending physician, personally viewed and interpreted this ECG.   Rate: Normal  Rhythm: Normal sinus  Axis: Normal  Intervals: Normal  ST&T Change: None    RADIOLOGY I independently reviewed imaging, my interpretation of imaging: Chest x-ray with no signs of pneumonia  LABS (all labs ordered are listed, but only abnormal results are displayed) Labs interpreted as -    Labs Reviewed  BASIC METABOLIC PANEL - Abnormal; Notable for the following components:      Result Value   CO2 20 (*)    Glucose, Bld 117 (*)    BUN 26 (*)    Creatinine, Ser 1.32 (*)    Calcium 8.8 (*)    GFR, Estimated 39 (*)    All other components within normal limits  CBC - Abnormal; Notable for the following components:   RBC 3.61 (*)    Hemoglobin 11.8 (*)    All other components within normal limits  TROPONIN I (HIGH SENSITIVITY)  TROPONIN I (HIGH SENSITIVITY)     MDM    Patient was given aspirin.  Serial troponins are negative.  Low suspicion for  pulmonary embolism, anticoagulated with no shortness of breath and no pleuritic chest pain.  Discussed admission for repeat stress testing given concerning features of her chest pain and history coronary artery disease.  Patient expressed understanding and states that she wants to follow-up as an outpatient with her cardiologist for possible repeat stress testing that she would return to the emergency department if she had any return of symptoms.  Encouraged to follow-up closely with cardiology and return for any return of symptoms.   PROCEDURES:  Critical Care performed: No  Procedures  Patient's presentation is most consistent with acute presentation with potential threat to life or bodily function.   MEDICATIONS ORDERED IN ED: Medications  aspirin chewable tablet 324 mg (324 mg Oral Given 05/01/23 1941)    FINAL CLINICAL IMPRESSION(S) / ED DIAGNOSES   Final diagnoses:  Chest pain, unspecified type     Rx / DC Orders   ED Discharge Orders          Ordered    Ambulatory referral to Cardiology       Comments: If you have not heard from the Cardiology office within the next 72 hours please call [phone].   05/01/23 2012             Note:  This document was prepared using Dragon voice recognition software and may include unintentional dictation errors.   Corena Herter, MD 05/01/23 2015

## 2023-05-01 NOTE — Discharge Instructions (Signed)
You are seen in the emergency department for chest pain.  You had 2 heart enzymes that were normal.  Do not believe that you are having a heart attack today.  You have multiple risk factors and features that are concerning for heart disease.  We discussed admission for repeat stress testing, you wanted to follow-up as an outpatient with your cardiologist.  Call your cardiologist to schedule close follow-up appointment to discuss repeat stress testing or cardiac catheterization.  It is importantly return to the emergency department if you have any return of chest pain or any new/concerning symptoms.  Thank you for choosing [place] for your health care, it was my pleasure to care for you today!  Corena Herter, MD

## 2023-05-01 NOTE — ED Provider Triage Note (Signed)
Emergency Medicine Provider Triage Evaluation Note  Sarah Potts , a 89 y.o. female  was evaluated in triage.  Pt complains of chest pain onset yesterday with radiation to left arm. Endorses intermittent SOB. Patient reports chest pain has alleviated some since onset.   Review of Systems  Positive:  Negative:   Physical Exam  Ht 5\' 2"  (1.575 m)   Wt 82.8 kg   BMI 33.39 kg/m  Gen:   Awake, no distress   Resp:  Normal effort LCTAB, RRR MSK:   Moves extremities without difficulty  Other:    Medical Decision Making  Medically screening exam initiated at 2:01 PM.  Appropriate orders placed.  Sarah Potts was informed that the remainder of the evaluation will be completed by another provider, this initial triage assessment does not replace that evaluation, and the importance of remaining in the ED until their evaluation is complete.    Romeo Apple, Braxtin Bamba A, PA-C 05/01/23 1405

## 2023-05-01 NOTE — ED Notes (Signed)
..  The patient is A&OX4, wheeled out of ED via wheelchair, NAD. Pt verbalized understanding of d/c instructions and follow up care.

## 2023-05-01 NOTE — ED Triage Notes (Signed)
Pt here with cp that started yesterday. Pt states pain is centered and radiates to her left arm. Pt describes the pain as constant pressure when she is up and moving. Pt took 1 nitroglycerin tablet at 1230 today, pt thinks she has a hx of angina. Pt alert and oriented.

## 2023-05-04 ENCOUNTER — Telehealth: Payer: Self-pay | Admitting: Cardiovascular Disease

## 2023-05-04 NOTE — Telephone Encounter (Signed)
Patient had went to the ER are and was told she needs to be sooner. Patient wants to know what Dr. Mariah Milling thinks. Please advise

## 2023-05-04 NOTE — Telephone Encounter (Signed)
Called and spoke with patient. Patient reports that she was evaluated in the Emergency Department on 05/01/23 for chest pain, left arm pain and nausea. Patient reports that all the test were negative but that she was told she needed to be seen by cardiology. Patient scheduled in office 05/08/23.

## 2023-05-07 ENCOUNTER — Encounter: Payer: Self-pay | Admitting: Emergency Medicine

## 2023-05-07 NOTE — Progress Notes (Signed)
Cardiology Clinic Note   Date: 05/08/2023 ID: [name], [name] 1933/03/23, MRN [id number]  Primary Cardiologist:  Julien Nordmann, MD  Patient Profile    Sarah Potts is a 89 y.o. female who presents to the clinic today for hospital follow up.     Past medical history significant for: CAD. LHC 05/14/2004 (angina): Proximal LAD 30%.  Mid LAD 50%.  D1 30%.  Proximal LCx 20%.  Proximal RCA 40%. PCI to LCx December 2019 in the setting of NSTEMI while in [place]. LHC 12/23/2021 (NSTEMI): Ostial RCA 30%.  Proximal RCA 40%.  Distal RCA 20%.  Mid to distal LCx 10%.  Proximal LCx 40%.  Proximal to mid LAD 50%.  Ostial to proximal LAD 40%.  Mild to moderate nonobstructive coronary artery disease patent LCx stent with no significant restenosis.  Moderate to severely calcified coronary arteries.  Possible stress-induced cardiomyopathy. PAF. Onset 2016. Chronic systolic heart failure with recovered LV function. Echo 04/22/2022: EF 60 to 65%.  No RWMA.  Moderate LVH.  Indeterminate diastolic parameters.  Normal RV size/function.  No significant valvular abnormalities. Hypertension. Hyperlipidemia. Lipid panel 04/29/2023: LDL 43, HDL 50, TG 201, total 133. Asthma. T2DM. CKD stage IIIa. Anal cancer. Presence of colostomy.  In summary, patient was diagnosed with A-fib in 2016 while in [place].  She is anticoagulated with Xarelto.  In December 2019 while in [place] she underwent PCI with a stent to LCx for suspected NSTEMI.  She was admitted to [facility] in August 2023 for a fall and was found to be in A-fib with RVR.  Troponin was negative.  TSH normal, COVID-negative.  She spontaneously converted on IV amiodarone.  Head CT was negative for acute intracranial abnormality.  Echo at that time showed EF 55 to 60%, no RWMA, Grade I DD, normal RV size/function, moderate MAC, aortic valve sclerosis without stenosis.  She returned to [facility] ED on 12/20/2021 with complaints of chest pain described as pressure with  associated shortness of breath and nausea.  Pain was similar to angina leading to PCI 2019.  Initial labs: Troponin 359, WBC 5, hemoglobin 12.1, potassium 4.9, creatinine 1.29, BUN 20.  Echo showed EF 40 to 45%, hypokinesis of anterior/anteroseptal and apical region, indeterminate diastolic parameters, normal RV size/function, moderate MAC.  Patient underwent LHC which showed patent LCx stent and stable nonobstructive CAD as detailed above.  Repeat echo January 2024 showed recovered LV function as detailed above.     History of Present Illness    Sarah Potts is followed by Dr. Mariah Milling for the above outlined history.  Patient was last seen in the office by Dr. Mariah Milling on 01/09/2023 for routine follow-up.  She was stable at that time and no medication changes were made.  Patient presented to the ED on 05/01/2023 with complaints of central chest pain radiating to left arm described as constant pressure with exertion and resolved with NTG x 1.  EKG without acute changes.  Initial labs: Sodium 138, potassium 4.6, creatinine 1.32, BUN 26, WBC 5.7, hemoglobin 11.8.  Chest x-ray showed no active cardiopulmonary disease.  Troponin negative x 2.  Today, patient is accompanied by her husband. She denies further chest pain since being seen in the ED. She has had some continued left arm soreness. Her son reports patient did a lot of physical activity on the day she presented to the ED. She had just returned home from being in inpatient rehab for a fall/knee fracture and was cleaning out her fridge. She was  doing a lot of heavy lifting. She reports mild dyspnea with heavy exertion but none with routine activities. She normally sleeps with her bed elevated about 30 degrees but this is not new for her. She denies lower extremity edema, PND, or palpitations. She does not have cardiac awareness of arrhythmia. Her biggest concern today is elevated BP in the mornings typically around 170-180/80-90 with associated headache.  She has noticed this since before Christmas.      ROS: All other systems reviewed and are otherwise negative except as noted in History of Present Illness.  EKGs/Labs Reviewed        03/12/2023: ALT 64; AST 80 05/01/2023: BUN 26; Creatinine, Ser 1.32; Potassium 4.6; Sodium 138   05/01/2023: Hemoglobin 11.8; WBC 5.7   03/06/2023: TSH 4.834   Risk Assessment/Calculations     CHA2DS2-VASc Score = 7   This indicates a 11.2% annual risk of stroke. The patient's score is based upon: CHF History: 1 HTN History: 1 Diabetes History: 1 Stroke History: 0 Vascular Disease History: 1 Age Score: 2 Gender Score: 1             Physical Exam    VS:  BP 131/75 (BP Location: Right Arm)   Pulse 66   Ht 5\' 2"  (1.575 m)   Wt 174 lb 9.6 oz (79.2 kg)   SpO2 96%   BMI 31.93 kg/m  , BMI Body mass index is 31.93 kg/m.  GEN: Well nourished, well developed, in no acute distress. Neck: No JVD or carotid bruits. Cardiac:  RRR. No murmurs. No rubs or gallops.   Respiratory:  Respirations regular and unlabored. Clear to auscultation without rales, wheezing or rhonchi. GI: Soft, nontender, nondistended. Extremities: Radials/DP/PT 2+ and equal bilaterally. No clubbing or cyanosis. No edema.  Skin: Warm and dry, no rash. Neuro: Strength intact.  Assessment & Plan   CAD S/p PCI to LCx December 2019 in the setting of NSTEMI performed in [place].  LHC September 2023 showed patent LCx stent and stable moderate CAD possible stress-induced cardiomyopathy.  Patient seen in ED 05/01/2023 with complaints of central chest pain with unremarkable workup.  She reports no further chest pain since ED visit. She has some mild left arm pain. She is back home after a stay in inpatient rehab for a fall/leg fracture. She is slowly returning to her normal activities of walking and performing household chores. No further cardiac workup indicated at this time.  -Continue Zetia, Toprol, as needed SL NTG.  Not on  aspirin secondary to Xarelto.  PAF Onset 2016 in [place].  Patient denies palpitations. She has no cardiac awareness of arrhythmia. Denies spontaneous bleeding concerns. -Continue Xarelto, amiodarone, Toprol.  Chronic systolic heart failure with recovered LV function Echo January 2024 showed normal LV/RV function, moderate LVH, no significant valvular abnormalities.  Patient denies lower extremity edema. She sleeps with the head of the bed elevated about 30 degrees but this is not new. She reports dyspnea with heavier exertion but none with routine activities.  Euvolemic and well compensated on exam. -Continue Toprol.  Hypertension BP today 131/75. She reports BP has been elevated in the mornings to 170-180/80-90 with associated headache. The headache goes away after medication. If she rechecks her BP during the day it typically is normal. She was started on low dose amlodipine by nephrology but has not started it yet.  -Start amlodipine at night.  -Continue Toprol during the day.  Hyperlipidemia LDL January 2025 43, at goal. -Continue Zetia.  Disposition: Start  amlodipine at night. Return for previously scheduled visit with Dr. Mariah Milling on 07/10/2023 or sooner as needed.          Signed, Etta Grandchild. Geniyah Eischeid, DNP, NP-C

## 2023-05-08 ENCOUNTER — Ambulatory Visit: Payer: Medicare HMO | Attending: Student | Admitting: Student

## 2023-05-08 ENCOUNTER — Encounter: Payer: Self-pay | Admitting: Student

## 2023-05-08 VITALS — BP 131/75 | HR 66 | Ht 62.0 in | Wt 174.6 lb

## 2023-05-08 DIAGNOSIS — E785 Hyperlipidemia, unspecified: Secondary | ICD-10-CM

## 2023-05-08 DIAGNOSIS — I2510 Atherosclerotic heart disease of native coronary artery without angina pectoris: Secondary | ICD-10-CM

## 2023-05-08 DIAGNOSIS — I10 Essential (primary) hypertension: Secondary | ICD-10-CM | POA: Diagnosis not present

## 2023-05-08 DIAGNOSIS — I480 Paroxysmal atrial fibrillation: Secondary | ICD-10-CM | POA: Diagnosis not present

## 2023-05-08 DIAGNOSIS — I5022 Chronic systolic (congestive) heart failure: Secondary | ICD-10-CM

## 2023-05-08 NOTE — Patient Instructions (Signed)
Medication Instructions:  Your physician recommends the following medication changes.  START TAKING: Amlodipine 2.5 mg every EVENING   *If you need a refill on your cardiac medications before your next appointment, please call your pharmacy*   Lab Work: None ordered at this time    Follow-Up: At [facility], you and your health needs are our priority.  As part of our continuing mission to provide you with exceptional heart care, we have created designated Provider Care Teams.  These Care Teams include your primary Cardiologist (physician) and Advanced Practice Providers (APPs -  Physician Assistants and Nurse Practitioners) who all work together to provide you with the care you need, when you need it.  We recommend signing up for the patient portal called "MyChart".  Sign up information is provided on this After Visit Summary.  MyChart is used to connect with patients for Virtual Visits (Telemedicine).  Patients are able to view lab/test results, encounter notes, upcoming appointments, etc.  Non-urgent messages can be sent to your provider as well.   To learn more about what you can do with MyChart, go to [email or web address].    Your next appointment:   March 28 with Dr Dossie Arbour

## 2023-05-31 ENCOUNTER — Other Ambulatory Visit: Payer: Self-pay | Admitting: Cardiovascular Disease

## 2023-05-31 DIAGNOSIS — I4891 Unspecified atrial fibrillation: Secondary | ICD-10-CM

## 2023-06-01 NOTE — Telephone Encounter (Signed)
 Prescription refill request for Xarelto received.  Indication:afib Last office visit:1/25 Weight:79.2  kg Age:89 Scr:1.2  2/25 CrCl:40.52  ml/min  Prescription refilled

## 2023-06-17 LAB — TSH: TSH: 4.69 u[IU]/mL — ABNORMAL HIGH (ref 0.450–4.500)

## 2023-06-19 ENCOUNTER — Encounter: Payer: Self-pay | Admitting: Cardiovascular Disease

## 2023-06-22 ENCOUNTER — Encounter: Payer: Self-pay | Admitting: Emergency Medicine

## 2023-06-24 ENCOUNTER — Other Ambulatory Visit
Admission: RE | Admit: 2023-06-24 | Discharge: 2023-06-24 | Disposition: A | Discharge status: Home / Self Care | Source: Ambulatory Visit | Attending: Emergency Medicine | Admitting: Emergency Medicine

## 2023-06-24 ENCOUNTER — Inpatient Hospital Stay: Payer: HMO | Attending: Oncology

## 2023-06-24 ENCOUNTER — Encounter: Payer: Self-pay | Admitting: *Deleted

## 2023-06-24 ENCOUNTER — Emergency Department

## 2023-06-24 ENCOUNTER — Other Ambulatory Visit: Payer: Self-pay

## 2023-06-24 DIAGNOSIS — N1831 Chronic kidney disease, stage 3a: Secondary | ICD-10-CM | POA: Diagnosis present

## 2023-06-24 DIAGNOSIS — E538 Deficiency of other specified B group vitamins: Secondary | ICD-10-CM | POA: Diagnosis not present

## 2023-06-24 DIAGNOSIS — N189 Chronic kidney disease, unspecified: Secondary | ICD-10-CM | POA: Diagnosis not present

## 2023-06-24 DIAGNOSIS — E1122 Type 2 diabetes mellitus with diabetic chronic kidney disease: Secondary | ICD-10-CM | POA: Insufficient documentation

## 2023-06-24 DIAGNOSIS — I129 Hypertensive chronic kidney disease with stage 1 through stage 4 chronic kidney disease, or unspecified chronic kidney disease: Secondary | ICD-10-CM | POA: Insufficient documentation

## 2023-06-24 DIAGNOSIS — R7989 Other specified abnormal findings of blood chemistry: Secondary | ICD-10-CM | POA: Diagnosis not present

## 2023-06-24 DIAGNOSIS — R0602 Shortness of breath: Secondary | ICD-10-CM | POA: Diagnosis present

## 2023-06-24 DIAGNOSIS — R059 Cough, unspecified: Secondary | ICD-10-CM | POA: Diagnosis present

## 2023-06-24 DIAGNOSIS — D631 Anemia in chronic kidney disease: Secondary | ICD-10-CM | POA: Diagnosis not present

## 2023-06-24 DIAGNOSIS — Z03818 Encounter for observation for suspected exposure to other biological agents ruled out: Secondary | ICD-10-CM | POA: Insufficient documentation

## 2023-06-24 DIAGNOSIS — I509 Heart failure, unspecified: Secondary | ICD-10-CM | POA: Insufficient documentation

## 2023-06-24 DIAGNOSIS — I130 Hypertensive heart and chronic kidney disease with heart failure and stage 1 through stage 4 chronic kidney disease, or unspecified chronic kidney disease: Secondary | ICD-10-CM | POA: Insufficient documentation

## 2023-06-24 DIAGNOSIS — E119 Type 2 diabetes mellitus without complications: Secondary | ICD-10-CM | POA: Insufficient documentation

## 2023-06-24 LAB — CMP (CANCER CENTER ONLY)
ALT: 52 U/L — ABNORMAL HIGH (ref 0–44)
AST: 86 U/L — ABNORMAL HIGH (ref 15–41)
Albumin: 4 g/dL (ref 3.5–5.0)
Alkaline Phosphatase: 72 U/L (ref 38–126)
Anion gap: 12 (ref 5–15)
BUN: 19 mg/dL (ref 8–23)
CO2: 22 mmol/L (ref 22–32)
Calcium: 9.5 mg/dL (ref 8.9–10.3)
Chloride: 101 mmol/L (ref 98–111)
Creatinine: 1.16 mg/dL — ABNORMAL HIGH (ref 0.44–1.00)
GFR, Estimated: 45 mL/min — ABNORMAL LOW (ref >60)
Glucose, Bld: 172 mg/dL — ABNORMAL HIGH (ref 70–99)
Potassium: 4.3 mmol/L (ref 3.5–5.1)
Sodium: 135 mmol/L (ref 135–145)
Total Bilirubin: 1.2 mg/dL (ref 0.0–1.2)
Total Protein: 7.6 g/dL (ref 6.5–8.1)

## 2023-06-24 LAB — CBC WITH DIFFERENTIAL (CANCER CENTER ONLY)
Abs Immature Granulocytes: 0.02 10*3/uL (ref 0.00–0.07)
Basophils Absolute: 0.1 10*3/uL (ref 0.0–0.1)
Basophils Relative: 1 %
Eosinophils Absolute: 0 10*3/uL (ref 0.0–0.5)
Eosinophils Relative: 1 %
HCT: 38.2 % (ref 36.0–46.0)
Hemoglobin: 12.7 g/dL (ref 12.0–15.0)
Immature Granulocytes: 0 %
Lymphocytes Relative: 14 %
Lymphs Abs: 0.8 10*3/uL (ref 0.7–4.0)
MCH: 32.5 pg (ref 26.0–34.0)
MCHC: 33.2 g/dL (ref 30.0–36.0)
MCV: 97.7 fL (ref 80.0–100.0)
Monocytes Absolute: 0.4 10*3/uL (ref 0.1–1.0)
Monocytes Relative: 7 %
Neutro Abs: 4.5 10*3/uL (ref 1.7–7.7)
Neutrophils Relative %: 77 %
Platelet Count: 302 10*3/uL (ref 150–400)
RBC: 3.91 MIL/uL (ref 3.87–5.11)
RDW: 14.6 % (ref 11.5–15.5)
WBC Count: 5.8 10*3/uL (ref 4.0–10.5)
nRBC: 0 % (ref 0.0–0.2)

## 2023-06-24 LAB — PROTIME-INR
INR: 1.2 (ref 0.8–1.2)
Prothrombin Time: 15.9 s — ABNORMAL HIGH (ref 11.4–15.2)

## 2023-06-24 LAB — CBC
HCT: 37.8 % (ref 36.0–46.0)
Hemoglobin: 12.7 g/dL (ref 12.0–15.0)
MCH: 33.4 pg (ref 26.0–34.0)
MCHC: 33.6 g/dL (ref 30.0–36.0)
MCV: 99.5 fL (ref 80.0–100.0)
Platelets: 321 10*3/uL (ref 150–400)
RBC: 3.8 MIL/uL — ABNORMAL LOW (ref 3.87–5.11)
RDW: 14.6 % (ref 11.5–15.5)
WBC: 7 10*3/uL (ref 4.0–10.5)
nRBC: 0 % (ref 0.0–0.2)

## 2023-06-24 LAB — IRON AND TIBC
Iron: 92 ug/dL (ref 28–170)
Saturation Ratios: 25 % (ref 10.4–31.8)
TIBC: 364 ug/dL (ref 250–450)
UIBC: 272 ug/dL

## 2023-06-24 LAB — BASIC METABOLIC PANEL
Anion gap: 13 (ref 5–15)
BUN: 23 mg/dL (ref 8–23)
CO2: 19 mmol/L — ABNORMAL LOW (ref 22–32)
Calcium: 9.4 mg/dL (ref 8.9–10.3)
Chloride: 103 mmol/L (ref 98–111)
Creatinine, Ser: 1.79 mg/dL — ABNORMAL HIGH (ref 0.44–1.00)
GFR, Estimated: 27 mL/min — ABNORMAL LOW (ref >60)
Glucose, Bld: 216 mg/dL — ABNORMAL HIGH (ref 70–99)
Potassium: 4.5 mmol/L (ref 3.5–5.1)
Sodium: 135 mmol/L (ref 135–145)

## 2023-06-24 LAB — TROPONIN I (HIGH SENSITIVITY)
Troponin I (High Sensitivity): 22 ng/L — ABNORMAL HIGH (ref <18)
Troponin I (High Sensitivity): 26 ng/L — ABNORMAL HIGH (ref <18)
Troponin I (High Sensitivity): 27 ng/L — ABNORMAL HIGH (ref <18)

## 2023-06-24 LAB — FERRITIN: Ferritin: 54 ng/mL (ref 11–307)

## 2023-06-24 LAB — BRAIN NATRIURETIC PEPTIDE: B Natriuretic Peptide: 102.7 pg/mL — ABNORMAL HIGH (ref 0.0–100.0)

## 2023-06-24 LAB — VITAMIN B12: Vitamin B-12: 374 pg/mL (ref 180–914)

## 2023-06-24 NOTE — ED Triage Notes (Signed)
 Pt ambulatory to triage with a walker.  Pt reports she had labs drawn at [facility] today and was called and told to come to ER for abnormal lab.  Pt has a cough, headache.  Pt reports negative covid,flu test today.  Pt had cxr today-negative for pneumonia.  Pt alert  speech clear.   No chest pain or sob.

## 2023-06-25 ENCOUNTER — Emergency Department
Admission: EM | Admit: 2023-06-25 | Discharge: 2023-06-25 | Disposition: A | Discharge status: Home / Self Care | Attending: Emergency Medicine | Admitting: Emergency Medicine

## 2023-06-25 ENCOUNTER — Telehealth: Payer: Self-pay | Admitting: Emergency Medicine

## 2023-06-25 DIAGNOSIS — R059 Cough, unspecified: Secondary | ICD-10-CM

## 2023-06-25 MED ORDER — DOXYCYCLINE HYCLATE 100 MG PO TABS
100.0000 mg | ORAL_TABLET | Freq: Two times a day (BID) | ORAL | 0 refills | Status: DC
Start: 2023-06-25 — End: 2023-07-01

## 2023-06-25 MED ORDER — AZITHROMYCIN 250 MG PO TABS: ORAL_TABLET | ORAL | 0 refills | Status: DC

## 2023-06-25 NOTE — Telephone Encounter (Signed)
 Rx sent for different ABX due to QT prlongation

## 2023-06-25 NOTE — Discharge Instructions (Addendum)
 Please take antibiotic as prescribed.  Please follow-up with your doctor in the next several days for recheck/reevaluation.  Return to the emergency department for any symptom concerning to yourself.

## 2023-06-25 NOTE — ED Provider Notes (Signed)
 [facility] Provider Note    Event Date/Time   First MD Initiated Contact with Patient 06/25/23 0209     (approximate)  History   Chief Complaint: Abnormal Lab  HPI  Sarah Potts is a 89 y.o. female with a past medical history of anemia, CHF, diabetes, hypertension, hyperlipidemia who presents to the emergency department for 4 weeks of cough.  According to the patient for the past 4 weeks she has been coughing with rare sputum production.  No fever.  Given the patient's persistent cough she saw her primary care doctor today they did lab work and an x-ray.  She was called and was told that her heart enzyme was elevated and she needs to go to the emergency department.  I reviewed the patient's lab work and her troponin was 22 which appears to be the reason the patient was sent to the emergency department.  Patient denies any chest pain.  Denies any significant shortness of breath but states she has been coughing for the past 4 weeks.  No fever.  Physical Exam   Triage Vital Signs: ED Triage Vitals  Encounter Vitals Group     BP 06/24/23 1937 (!) 152/77     Systolic BP Percentile --      Diastolic BP Percentile --      Pulse Rate 06/24/23 1937 94     Resp 06/24/23 1937 20     Temp 06/24/23 1937 98.4 F (36.9 C)     Temp Source 06/24/23 1937 Oral     SpO2 06/24/23 1937 96 %     Weight 06/24/23 1938 166 lb (75.3 kg)     Height 06/24/23 1938 5\' 2"  (1.575 m)     Head Circumference --      Peak Flow --      Pain Score 06/24/23 1936 0     Pain Loc --      Pain Education --      Exclude from Growth Chart --     Most recent vital signs: Vitals:   06/25/23 0203 06/25/23 0209  BP: (!) 136/58   Pulse: 69   Resp: 19   Temp:    SpO2: 97% 96%    General: Awake, no distress.  CV:  Good peripheral perfusion.  Regular rate and rhythm  Resp:  Normal effort.  Equal breath sounds bilaterally.  Abd:  No distention.  Soft, nontender.  No rebound or  guarding.  ED Results / Procedures / Treatments   EKG  EKG viewed and interpreted by myself shows a normal sinus rhythm 89 bpm with a narrow QRS, normal axis, normal intervals, nonspecific ST changes.  RADIOLOGY  I have reviewed interpret the chest x-ray images.  No consolidation on my evaluation. Radiology is read the x-ray is negative.   MEDICATIONS ORDERED IN ED: Medications - No data to display   IMPRESSION / MDM / ASSESSMENT AND PLAN / ED COURSE  I reviewed the triage vital signs and the nursing notes.  Patient's presentation is most consistent with acute presentation with potential threat to life or bodily function.  Patient presents to the emergency department for cough x 4 weeks and abnormal lab work which was an elevated troponin of 22.  Patient denies any chest pain.  Patient's workup in the emergency department is overall reassuring troponin of 26 and on repeat 27.  Patient CBC shows no concerning findings with a normal white blood cell count.  Patient's chemistry shows slight renal insufficiency  however patient has a history of chronic kidney disease.  Chest x-ray is clear and EKG reassuring.  Given the patient's 4 weeks of cough though I did discuss with the patient the possibility of a chronic bronchitis.  Will cover with antibiotics as a precaution.  Have the patient follow-up with her doctor for recheck.  Patient is agreeable to plan and reassured by today's workup.  FINAL CLINICAL IMPRESSION(S) / ED DIAGNOSES   Elevated troponin Cough   Note:  This document was prepared using Dragon voice recognition software and may include unintentional dictation errors.   Minna Antis, MD 06/25/23 [phone]

## 2023-06-28 ENCOUNTER — Encounter: Payer: Self-pay | Admitting: Radiology

## 2023-06-28 ENCOUNTER — Other Ambulatory Visit: Payer: Self-pay

## 2023-06-28 ENCOUNTER — Emergency Department

## 2023-06-28 ENCOUNTER — Inpatient Hospital Stay
Admission: EM | Admit: 2023-06-28 | Discharge: 2023-07-01 | DRG: 281 | Disposition: A | Discharge status: Home / Self Care | Attending: Internal Medicine | Admitting: Internal Medicine

## 2023-06-28 DIAGNOSIS — Z7985 Long-term (current) use of injectable non-insulin antidiabetic drugs: Secondary | ICD-10-CM

## 2023-06-28 DIAGNOSIS — Z5986 Financial insecurity: Secondary | ICD-10-CM

## 2023-06-28 DIAGNOSIS — Z803 Family history of malignant neoplasm of breast: Secondary | ICD-10-CM

## 2023-06-28 DIAGNOSIS — Z7901 Long term (current) use of anticoagulants: Secondary | ICD-10-CM

## 2023-06-28 DIAGNOSIS — I252 Old myocardial infarction: Secondary | ICD-10-CM

## 2023-06-28 DIAGNOSIS — Z7984 Long term (current) use of oral hypoglycemic drugs: Secondary | ICD-10-CM

## 2023-06-28 DIAGNOSIS — R2681 Unsteadiness on feet: Secondary | ICD-10-CM | POA: Diagnosis present

## 2023-06-28 DIAGNOSIS — Z888 Allergy status to other drugs, medicaments and biological substances status: Secondary | ICD-10-CM

## 2023-06-28 DIAGNOSIS — Z6830 Body mass index (BMI) 30.0-30.9, adult: Secondary | ICD-10-CM

## 2023-06-28 DIAGNOSIS — Z8249 Family history of ischemic heart disease and other diseases of the circulatory system: Secondary | ICD-10-CM

## 2023-06-28 DIAGNOSIS — Z955 Presence of coronary angioplasty implant and graft: Secondary | ICD-10-CM

## 2023-06-28 DIAGNOSIS — I5032 Chronic diastolic (congestive) heart failure: Secondary | ICD-10-CM | POA: Diagnosis present

## 2023-06-28 DIAGNOSIS — Z79890 Hormone replacement therapy (postmenopausal): Secondary | ICD-10-CM

## 2023-06-28 DIAGNOSIS — Z79899 Other long term (current) drug therapy: Secondary | ICD-10-CM

## 2023-06-28 DIAGNOSIS — R531 Weakness: Secondary | ICD-10-CM | POA: Diagnosis present

## 2023-06-28 DIAGNOSIS — I214 Non-ST elevation (NSTEMI) myocardial infarction: Principal | ICD-10-CM | POA: Diagnosis present

## 2023-06-28 DIAGNOSIS — I4891 Unspecified atrial fibrillation: Secondary | ICD-10-CM | POA: Diagnosis present

## 2023-06-28 DIAGNOSIS — Z881 Allergy status to other antibiotic agents status: Secondary | ICD-10-CM

## 2023-06-28 DIAGNOSIS — E785 Hyperlipidemia, unspecified: Secondary | ICD-10-CM | POA: Diagnosis present

## 2023-06-28 DIAGNOSIS — E1122 Type 2 diabetes mellitus with diabetic chronic kidney disease: Secondary | ICD-10-CM | POA: Diagnosis present

## 2023-06-28 DIAGNOSIS — E119 Type 2 diabetes mellitus without complications: Secondary | ICD-10-CM

## 2023-06-28 DIAGNOSIS — R42 Dizziness and giddiness: Secondary | ICD-10-CM | POA: Diagnosis not present

## 2023-06-28 DIAGNOSIS — Z85048 Personal history of other malignant neoplasm of rectum, rectosigmoid junction, and anus: Secondary | ICD-10-CM

## 2023-06-28 DIAGNOSIS — I5020 Unspecified systolic (congestive) heart failure: Secondary | ICD-10-CM | POA: Insufficient documentation

## 2023-06-28 DIAGNOSIS — I480 Paroxysmal atrial fibrillation: Secondary | ICD-10-CM | POA: Diagnosis present

## 2023-06-28 DIAGNOSIS — H532 Diplopia: Secondary | ICD-10-CM | POA: Diagnosis present

## 2023-06-28 DIAGNOSIS — I10 Essential (primary) hypertension: Secondary | ICD-10-CM | POA: Diagnosis present

## 2023-06-28 DIAGNOSIS — Z66 Do not resuscitate: Secondary | ICD-10-CM | POA: Diagnosis present

## 2023-06-28 DIAGNOSIS — J45909 Unspecified asthma, uncomplicated: Secondary | ICD-10-CM | POA: Diagnosis present

## 2023-06-28 DIAGNOSIS — I130 Hypertensive heart and chronic kidney disease with heart failure and stage 1 through stage 4 chronic kidney disease, or unspecified chronic kidney disease: Secondary | ICD-10-CM | POA: Diagnosis present

## 2023-06-28 DIAGNOSIS — E669 Obesity, unspecified: Secondary | ICD-10-CM | POA: Diagnosis present

## 2023-06-28 DIAGNOSIS — I2510 Atherosclerotic heart disease of native coronary artery without angina pectoris: Secondary | ICD-10-CM | POA: Diagnosis present

## 2023-06-28 DIAGNOSIS — Z8049 Family history of malignant neoplasm of other genital organs: Secondary | ICD-10-CM

## 2023-06-28 DIAGNOSIS — I700 Atherosclerosis of aorta: Secondary | ICD-10-CM | POA: Diagnosis present

## 2023-06-28 DIAGNOSIS — N1831 Chronic kidney disease, stage 3a: Secondary | ICD-10-CM | POA: Diagnosis present

## 2023-06-28 LAB — CBC WITH DIFFERENTIAL/PLATELET
Abs Immature Granulocytes: 0.04 10*3/uL (ref 0.00–0.07)
Basophils Absolute: 0.1 10*3/uL (ref 0.0–0.1)
Basophils Relative: 1 %
Eosinophils Absolute: 0.1 10*3/uL (ref 0.0–0.5)
Eosinophils Relative: 1 %
HCT: 37.7 % (ref 36.0–46.0)
Hemoglobin: 12.4 g/dL (ref 12.0–15.0)
Immature Granulocytes: 1 %
Lymphocytes Relative: 15 %
Lymphs Abs: 1 10*3/uL (ref 0.7–4.0)
MCH: 33 pg (ref 26.0–34.0)
MCHC: 32.9 g/dL (ref 30.0–36.0)
MCV: 100.3 fL — ABNORMAL HIGH (ref 80.0–100.0)
Monocytes Absolute: 0.4 10*3/uL (ref 0.1–1.0)
Monocytes Relative: 7 %
Neutro Abs: 4.7 10*3/uL (ref 1.7–7.7)
Neutrophils Relative %: 75 %
Platelets: 256 10*3/uL (ref 150–400)
RBC: 3.76 MIL/uL — ABNORMAL LOW (ref 3.87–5.11)
RDW: 14.5 % (ref 11.5–15.5)
WBC: 6.2 10*3/uL (ref 4.0–10.5)
nRBC: 0 % (ref 0.0–0.2)

## 2023-06-28 LAB — COMPREHENSIVE METABOLIC PANEL
ALT: 64 U/L — ABNORMAL HIGH (ref 0–44)
AST: 107 U/L — ABNORMAL HIGH (ref 15–41)
Albumin: 3.6 g/dL (ref 3.5–5.0)
Alkaline Phosphatase: 66 U/L (ref 38–126)
Anion gap: 14 (ref 5–15)
BUN: 24 mg/dL — ABNORMAL HIGH (ref 8–23)
CO2: 18 mmol/L — ABNORMAL LOW (ref 22–32)
Calcium: 9.3 mg/dL (ref 8.9–10.3)
Chloride: 103 mmol/L (ref 98–111)
Creatinine, Ser: 1.08 mg/dL — ABNORMAL HIGH (ref 0.44–1.00)
GFR, Estimated: 49 mL/min — ABNORMAL LOW (ref >60)
Glucose, Bld: 179 mg/dL — ABNORMAL HIGH (ref 70–99)
Potassium: 3.9 mmol/L (ref 3.5–5.1)
Sodium: 135 mmol/L (ref 135–145)
Total Bilirubin: 1.1 mg/dL (ref 0.0–1.2)
Total Protein: 7.2 g/dL (ref 6.5–8.1)

## 2023-06-28 LAB — TROPONIN I (HIGH SENSITIVITY): Troponin I (High Sensitivity): 65 ng/L — ABNORMAL HIGH (ref <18)

## 2023-06-28 NOTE — ED Triage Notes (Signed)
 Pt reports onset of dizziness at 3pm when she got up to walk to the bathroom. Pt denies HA. Pt reports n/v. Dizziness is worse when she turns her head to either side. No other neurological sx present. No focal deficits.

## 2023-06-28 NOTE — ED Notes (Signed)
 Ostomy bag emptied at this time. Pt denies pain or any other complaints at this time.

## 2023-06-28 NOTE — ED Notes (Addendum)
 Discussed symptoms with Dr Marisa Severin who recommended EKG, basic labs, and non-emergent head CT.

## 2023-06-28 NOTE — ED Triage Notes (Signed)
 First nurse note: Patient arrived by [facility]. Lives in senior living on [address]. Seen Thursday and prescribed z-pack and doxycyline for cough.  Still c/o cough, dizziness and N/V. Reported double vision but has subsided since arriving at hospital  EMS vitals: 174/88b/p 98.1oral 153CBG 92% RA  EMS admnistered 4mg  zofran

## 2023-06-28 NOTE — ED Provider Notes (Signed)
 [facility] Provider Note    Event Date/Time   First MD Initiated Contact with Patient 06/28/23 1953     (approximate)   History   Chief Complaint: Dizziness   HPI  Sarah Potts is a 89 y.o. female with a history of hypertension diabetes CHF who comes the ED due to dizziness which started at 3:00 PM when she got up from sitting to go to the bathroom.  Felt like the room was spinning, worse with movement and head turning.  Sat down, did not fall or pass out.  Denies chest pain or shortness of breath, denies headache.  Dizziness was associated with double vision which lasted for 10 minutes and then resolved.  The dizziness itself lasted for 1 hour and has now resolved.  She was recently seen in the ED and diagnosed with bronchitis due to cough.  She was started on doxycycline and albuterol which she has been taking over the last several days.          Physical Exam   Triage Vital Signs: ED Triage Vitals  Encounter Vitals Group     BP 06/28/23 1810 (!) 149/70     Systolic BP Percentile --      Diastolic BP Percentile --      Pulse Rate 06/28/23 1810 81     Resp 06/28/23 1810 19     Temp 06/28/23 1810 98.2 F (36.8 C)     Temp Source 06/28/23 1810 Oral     SpO2 06/28/23 1810 95 %     Weight --      Height --      Head Circumference --      Peak Flow --      Pain Score 06/28/23 1809 0     Pain Loc --      Pain Education --      Exclude from Growth Chart --     Most recent vital signs: Vitals:   06/28/23 1950 06/28/23 2232  BP: (!) 144/61 (!) 149/58  Pulse: 63 68  Resp: 16 16  Temp:    SpO2: 95% 97%    General: Awake, no distress.  CV:  Good peripheral perfusion.  Regular rate and rhythm Resp:  Normal effort.  Coarse breath sounds, good air entry bilaterally, no wheezing Abd:  No distention.  Soft nontender Other:  No lower extremity edema   ED Results / Procedures / Treatments   Labs (all labs ordered are listed, but only  abnormal results are displayed) Labs Reviewed  CBC WITH DIFFERENTIAL/PLATELET - Abnormal; Notable for the following components:      Result Value   RBC 3.76 (*)    MCV 100.3 (*)    All other components within normal limits  COMPREHENSIVE METABOLIC PANEL - Abnormal; Notable for the following components:   CO2 18 (*)    Glucose, Bld 179 (*)    BUN 24 (*)    Creatinine, Ser 1.08 (*)    AST 107 (*)    ALT 64 (*)    GFR, Estimated 49 (*)    All other components within normal limits  TROPONIN I (HIGH SENSITIVITY) - Abnormal; Notable for the following components:   Troponin I (High Sensitivity) 65 (*)    All other components within normal limits  TROPONIN I (HIGH SENSITIVITY)     EKG Interpreted by me Normal sinus rhythm rate of 83.  Normal axis, normal intervals.  Poor R wave progression.  Normal ST segments  and T waves   RADIOLOGY CT head interpreted by me, no intracranial hemorrhage.  No mass.  Radiology report reviewed  MRI brain unremarkable   PROCEDURES:  Procedures   MEDICATIONS ORDERED IN ED: Medications - No data to display   IMPRESSION / MDM / ASSESSMENT AND PLAN / ED COURSE  I reviewed the triage vital signs and the nursing notes.  DDx: Peripheral vertigo, dehydration, electrolyte derangement, AKI, TIA/CVA, orthostatic dizziness  Patient's presentation is most consistent with acute presentation with potential threat to life or bodily function.  Patient presents with episode of dizziness with standing up, most likely orthostatic.  Vital signs here are unremarkable and symptoms have resolved and she is back to baseline.  Initial labs are reassuring.  Troponin was 65, sent by triage protocol but not really indicated since she is not having any chest pain or acute shortness of breath.  This is slightly increased from 4 days ago which is likely due to albuterol use.  CT head negative, will obtain MRI to further evaluate the dizziness with brief episode of double  vision as well.   Clinical Course as of 06/28/23 2334  Wynelle Link Jun 28, 2023  2236 MRI unremarkable, no acute findings.  Awaiting repeat troponin, if trend is flat I think she stable for discharge. [PS]    Clinical Course User Index [PS] Sharman Cheek, MD     FINAL CLINICAL IMPRESSION(S) / ED DIAGNOSES   Final diagnoses:  Vertigo     Rx / DC Orders   ED Discharge Orders     None        Note:  This document was prepared using Dragon voice recognition software and may include unintentional dictation errors.   Sharman Cheek, MD 06/28/23 [id number]

## 2023-06-29 ENCOUNTER — Inpatient Hospital Stay: Payer: HMO | Admitting: Oncology

## 2023-06-29 ENCOUNTER — Inpatient Hospital Stay

## 2023-06-29 ENCOUNTER — Inpatient Hospital Stay: Payer: HMO

## 2023-06-29 ENCOUNTER — Observation Stay (HOSPITAL_COMMUNITY)
Admit: 2023-06-29 | Discharge: 2023-06-29 | Disposition: A | Discharge status: Home / Self Care | Attending: Family Medicine | Admitting: Family Medicine

## 2023-06-29 ENCOUNTER — Encounter: Payer: Self-pay | Admitting: Internal Medicine

## 2023-06-29 ENCOUNTER — Emergency Department

## 2023-06-29 DIAGNOSIS — I5020 Unspecified systolic (congestive) heart failure: Secondary | ICD-10-CM | POA: Insufficient documentation

## 2023-06-29 DIAGNOSIS — I5032 Chronic diastolic (congestive) heart failure: Secondary | ICD-10-CM | POA: Diagnosis present

## 2023-06-29 DIAGNOSIS — R7989 Other specified abnormal findings of blood chemistry: Secondary | ICD-10-CM

## 2023-06-29 DIAGNOSIS — Z7901 Long term (current) use of anticoagulants: Secondary | ICD-10-CM | POA: Diagnosis not present

## 2023-06-29 DIAGNOSIS — R2681 Unsteadiness on feet: Secondary | ICD-10-CM | POA: Diagnosis present

## 2023-06-29 DIAGNOSIS — E669 Obesity, unspecified: Secondary | ICD-10-CM | POA: Diagnosis present

## 2023-06-29 DIAGNOSIS — Z881 Allergy status to other antibiotic agents status: Secondary | ICD-10-CM | POA: Diagnosis not present

## 2023-06-29 DIAGNOSIS — Z803 Family history of malignant neoplasm of breast: Secondary | ICD-10-CM | POA: Diagnosis not present

## 2023-06-29 DIAGNOSIS — I214 Non-ST elevation (NSTEMI) myocardial infarction: Secondary | ICD-10-CM | POA: Diagnosis present

## 2023-06-29 DIAGNOSIS — J45909 Unspecified asthma, uncomplicated: Secondary | ICD-10-CM | POA: Diagnosis present

## 2023-06-29 DIAGNOSIS — I4891 Unspecified atrial fibrillation: Secondary | ICD-10-CM

## 2023-06-29 DIAGNOSIS — I700 Atherosclerosis of aorta: Secondary | ICD-10-CM | POA: Diagnosis present

## 2023-06-29 DIAGNOSIS — Z8249 Family history of ischemic heart disease and other diseases of the circulatory system: Secondary | ICD-10-CM | POA: Diagnosis not present

## 2023-06-29 DIAGNOSIS — Z7985 Long-term (current) use of injectable non-insulin antidiabetic drugs: Secondary | ICD-10-CM | POA: Diagnosis not present

## 2023-06-29 DIAGNOSIS — I480 Paroxysmal atrial fibrillation: Secondary | ICD-10-CM | POA: Diagnosis present

## 2023-06-29 DIAGNOSIS — Z79890 Hormone replacement therapy (postmenopausal): Secondary | ICD-10-CM | POA: Diagnosis not present

## 2023-06-29 DIAGNOSIS — I2510 Atherosclerotic heart disease of native coronary artery without angina pectoris: Secondary | ICD-10-CM | POA: Diagnosis present

## 2023-06-29 DIAGNOSIS — Z7984 Long term (current) use of oral hypoglycemic drugs: Secondary | ICD-10-CM | POA: Diagnosis not present

## 2023-06-29 DIAGNOSIS — R42 Dizziness and giddiness: Secondary | ICD-10-CM | POA: Diagnosis present

## 2023-06-29 DIAGNOSIS — Z955 Presence of coronary angioplasty implant and graft: Secondary | ICD-10-CM | POA: Diagnosis not present

## 2023-06-29 DIAGNOSIS — Z888 Allergy status to other drugs, medicaments and biological substances status: Secondary | ICD-10-CM | POA: Diagnosis not present

## 2023-06-29 DIAGNOSIS — Z66 Do not resuscitate: Secondary | ICD-10-CM | POA: Diagnosis present

## 2023-06-29 DIAGNOSIS — R531 Weakness: Secondary | ICD-10-CM | POA: Diagnosis present

## 2023-06-29 DIAGNOSIS — H532 Diplopia: Secondary | ICD-10-CM | POA: Diagnosis present

## 2023-06-29 DIAGNOSIS — N1831 Chronic kidney disease, stage 3a: Secondary | ICD-10-CM | POA: Diagnosis present

## 2023-06-29 DIAGNOSIS — E1122 Type 2 diabetes mellitus with diabetic chronic kidney disease: Secondary | ICD-10-CM | POA: Diagnosis present

## 2023-06-29 DIAGNOSIS — I130 Hypertensive heart and chronic kidney disease with heart failure and stage 1 through stage 4 chronic kidney disease, or unspecified chronic kidney disease: Secondary | ICD-10-CM | POA: Diagnosis present

## 2023-06-29 DIAGNOSIS — E785 Hyperlipidemia, unspecified: Secondary | ICD-10-CM | POA: Diagnosis present

## 2023-06-29 LAB — ECHOCARDIOGRAM COMPLETE
AR max vel: 1.85 cm2
AV Area VTI: 1.99 cm2
AV Area mean vel: 1.79 cm2
AV Mean grad: 5 mmHg
AV Peak grad: 8.6 mmHg
Ao pk vel: 1.47 m/s
Area-P 1/2: 1.87 cm2
Calc EF: 64.2 %
Height: 62 in
MV VTI: 2.02 cm2
S' Lateral: 1.9 cm
Single Plane A2C EF: 67 %
Single Plane A4C EF: 60.4 %

## 2023-06-29 LAB — TROPONIN I (HIGH SENSITIVITY)
Troponin I (High Sensitivity): 279 ng/L (ref <18)
Troponin I (High Sensitivity): 162 ng/L (ref <18)
Troponin I (High Sensitivity): 115 ng/L (ref <18)

## 2023-06-29 LAB — CBG MONITORING, ED
Glucose-Capillary: 113 mg/dL — ABNORMAL HIGH (ref 70–99)
Glucose-Capillary: 152 mg/dL — ABNORMAL HIGH (ref 70–99)
Glucose-Capillary: 162 mg/dL — ABNORMAL HIGH (ref 70–99)

## 2023-06-29 LAB — PROTIME-INR
INR: 1.8 — ABNORMAL HIGH (ref 0.8–1.2)
Prothrombin Time: 20.7 s — ABNORMAL HIGH (ref 11.4–15.2)

## 2023-06-29 LAB — APTT
aPTT: 40 s — ABNORMAL HIGH (ref 24–36)
aPTT: 69 s — ABNORMAL HIGH (ref 24–36)
aPTT: 120 s — ABNORMAL HIGH (ref 24–36)

## 2023-06-29 LAB — HEPARIN LEVEL (UNFRACTIONATED)
Heparin Unfractionated: >1.1 [IU]/mL — ABNORMAL HIGH (ref 0.30–0.70)
Heparin Unfractionated: >1.1 [IU]/mL — ABNORMAL HIGH (ref 0.30–0.70)

## 2023-06-29 MED ORDER — IOHEXOL 350 MG/ML SOLN
75.0000 mL | Freq: Once | INTRAVENOUS | Status: AC | PRN
  Administered 2023-06-29: 75 mL via INTRAVENOUS

## 2023-06-29 MED ORDER — HEPARIN (PORCINE) 25000 UT/250ML-% IV SOLN
600.0000 [IU]/h | INTRAVENOUS | Status: DC
  Filled 2023-06-29: qty 250

## 2023-06-29 MED ORDER — ACETAMINOPHEN 325 MG PO TABS
650.0000 mg | ORAL_TABLET | ORAL | Status: DC | PRN
  Administered 2023-06-29 (×2): 650 mg via ORAL
  Filled 2023-06-29 (×2): qty 2

## 2023-06-29 MED ORDER — ONDANSETRON HCL 4 MG/2ML IJ SOLN
4.0000 mg | Freq: Four times a day (QID) | INTRAMUSCULAR | Status: DC | PRN
Start: 2023-06-29 — End: 2023-07-01

## 2023-06-29 MED ORDER — SERTRALINE HCL 50 MG PO TABS
50.0000 mg | ORAL_TABLET | Freq: Every day | ORAL | Status: DC
  Administered 2023-06-29 – 2023-07-01 (×3): 50 mg via ORAL
  Filled 2023-06-29 (×3): qty 1

## 2023-06-29 MED ORDER — BENZONATATE 100 MG PO CAPS
200.0000 mg | ORAL_CAPSULE | Freq: Two times a day (BID) | ORAL | Status: DC | PRN
Start: 2023-06-29 — End: 2023-07-01

## 2023-06-29 MED ORDER — INSULIN ASPART 100 UNIT/ML IJ SOLN
0.0000 [IU] | Freq: Three times a day (TID) | INTRAMUSCULAR | Status: DC
  Administered 2023-06-29 – 2023-06-30 (×3): 2 [IU] via SUBCUTANEOUS
  Administered 2023-06-30 – 2023-07-01 (×2): 1 [IU] via SUBCUTANEOUS
  Filled 2023-06-29 (×5): qty 1

## 2023-06-29 MED ORDER — NITROGLYCERIN 0.4 MG SL SUBL
0.4000 mg | SUBLINGUAL_TABLET | SUBLINGUAL | Status: DC | PRN
Start: 2023-06-29 — End: 2023-07-01

## 2023-06-29 MED ORDER — AMIODARONE HCL 200 MG PO TABS
200.0000 mg | ORAL_TABLET | Freq: Every day | ORAL | Status: DC
  Administered 2023-06-29 – 2023-07-01 (×3): 200 mg via ORAL
  Filled 2023-06-29 (×3): qty 1

## 2023-06-29 MED ORDER — HEPARIN (PORCINE) 25000 UT/250ML-% IV SOLN
INTRAVENOUS | Status: AC
Start: 2023-06-29 — End: 2023-06-29
  Administered 2023-06-29: 800 [IU]/h via INTRAVENOUS
  Filled 2023-06-29: qty 250

## 2023-06-29 MED ORDER — LEVOTHYROXINE SODIUM 25 MCG PO TABS
25.0000 ug | ORAL_TABLET | Freq: Every day | ORAL | Status: DC
  Administered 2023-06-30 – 2023-07-01 (×2): 25 ug via ORAL
  Filled 2023-06-29 (×2): qty 1

## 2023-06-29 NOTE — Progress Notes (Addendum)
 PHARMACY - ANTICOAGULATION CONSULT NOTE  Pharmacy Consult for Heparin  Indication: chest pain/ACS  Allergies  Allergen Reactions   Levofloxacin Nausea Only    Patient reports that she thinks that she could take this medication.  Patient reports that she thinks that she could take this medication.     Patient reports that she thinks that she could take this medication.   Niacin Other (See Comments)    Hot, Flushed  Pt states she get real red and hot.    Hot, Flushed   Niacin And Related Other (See Comments)    Pt states she get real red and hot.    Patient Measurements: Height: 5\' 2"  (157.5 cm) IBW/kg (Calculated) : 50.1 Heparin Dosing Weight: 66 kg   Vital Signs: Temp: 98.3 F (36.8 C) (03/17 0900) Temp Source: Oral (03/17 0900) BP: 168/75 (03/17 0900) Pulse Rate: 62 (03/17 0900)  Labs: Recent Labs    06/28/23 1811 06/28/23 2322 06/29/23 0125 06/29/23 0147 06/29/23 0841  HGB 12.4  --   --   --   --   HCT 37.7  --   --   --   --   PLT 256  --   --   --   --   APTT  --   --  40*  --  69*  LABPROT  --   --  20.7*  --   --   INR  --   --  1.8*  --   --   HEPARINUNFRC  --   --   --  >1.10* >1.10*  CREATININE 1.08*  --   --   --   --   TROPONINIHS 65* 279*  --   --  162*    Estimated Creatinine Clearance: 34.2 mL/min (A) (by C-G formula based on SCr of 1.08 mg/dL (H)).   Medical History: Past Medical History:  Diagnosis Date   Anal cancer (HCC)    Anemia    Asthma    CHF (congestive heart failure) (HCC)    Diabetes mellitus    Hyperlipidemia    Hypertension     Medications:  (Not in a hospital admission)   Assessment: Pharmacy consulted to dose heparin in this 89 year old female admitted with ACS/NSTEMI. Pt was on Xarelto 15 mg PO daily PTA for Afib. CHA2DS2VASc at least 6 (CHF, HTN, age +99, DM, female). Last dose Sunday 3/16 @ 1000.   CrCl = 34.2 ml/min   Goal of Therapy:  Heparin level 0.3-0.7 units/ml aPTT 66 - 102  seconds Monitor  platelets by anticoagulation protocol: Yes  Date Time aPTT/HL Rate/Comment 3/17 0841 69 seconds Therapeutic x 1       Plan:  Continue heparin infusion at 800 units/hour. Check aPTT in 8 hours Check heparin level with AM labs tomorrow Continue to monitor CBC daily while on heparin infusion.  Will M. Dareen Piano, PharmD Clinical Pharmacist 06/29/2023 9:54 AM

## 2023-06-29 NOTE — Assessment & Plan Note (Signed)
 Rate controlled at present  On heparin gtt in setting of NSTEMI  Cont home meds including amiodarone

## 2023-06-29 NOTE — ED Notes (Signed)
 CCMD called to admit pt to monitor. Per CCMD, pt already on monitor.

## 2023-06-29 NOTE — Assessment & Plan Note (Addendum)
 Baseline mild to moderate nonobstructive coronary artery disease with stable angina  Trop 65-->279 in setting of dizziness, double vision and nausea with concern for NSTEMI with baseline CAD  CTA negative for PE  S/p ASA  Started on heparin gtt in the ER  Will continue to trend troponin  2D ECHO  Consult cardiology  Follow up recommendations

## 2023-06-29 NOTE — Progress Notes (Addendum)
 PHARMACY - ANTICOAGULATION CONSULT NOTE  Pharmacy Consult for Heparin  Indication: chest pain/ACS  Allergies  Allergen Reactions   Levofloxacin Nausea Only    Patient reports that she thinks that she could take this medication.  Patient reports that she thinks that she could take this medication.     Patient reports that she thinks that she could take this medication.   Niacin Other (See Comments)    Hot, Flushed  Pt states she get real red and hot.    Hot, Flushed   Niacin And Related Other (See Comments)    Pt states she get real red and hot.    Patient Measurements:   Heparin Dosing Weight: 66 kg   Vital Signs: Temp: 98 F (36.7 C) (03/17 0100) Temp Source: Oral (03/16 1810) BP: 152/56 (03/17 0100) Pulse Rate: 65 (03/17 0100)  Labs: Recent Labs    06/28/23 1811 06/28/23 2322  HGB 12.4  --   HCT 37.7  --   PLT 256  --   CREATININE 1.08*  --   TROPONINIHS 65* 279*    Estimated Creatinine Clearance: 34.2 mL/min (A) (by C-G formula based on SCr of 1.08 mg/dL (H)).   Medical History: Past Medical History:  Diagnosis Date   Anal cancer (HCC)    Anemia    Asthma    CHF (congestive heart failure) (HCC)    Diabetes mellitus    Hyperlipidemia    Hypertension     Medications:  (Not in a hospital admission)   Assessment: Pharmacy consulted to dose heparin in this 89 year old female admitted with ACS/NSTEMI.  Pt was on Xarelto 15 mg PO daily PTA.  Last dose Sunday 3/16 @ 1000.  CrCl = 34.2 ml/min   Goal of Therapy:  Heparin level 0.3-0.7 units/ml aPTT 66 - 102  seconds Monitor platelets by anticoagulation protocol: Yes   Plan:  No bolus due to Xarelto PTA.  Start heparin infusion at 800 units/hr - will use aPTT to guide dosing until correlating with HL - will check aPTT and HL 8 hrs after start of drip - CBC daily   Jenna Ardoin D 06/29/2023,1:28 AM

## 2023-06-29 NOTE — H&P (Addendum)
 History and Physical    Patient: Sarah Potts NWG:[id number] DOB: 08/24/1932 DOA: 06/28/2023 DOS: the patient was seen and examined on 06/29/2023 PCP: Jerl Mina, MD  Patient coming from: Home  Chief Complaint:  Chief Complaint  Patient presents with   Dizziness   HPI: Sarah Potts is a 89 y.o. female with medical history significant of hypertension, atrial fibrillation, CAD s/p coronary stent, chronic systolic heart failure, type II DM, depression, history of anal cancer s/p colostomy presenting w/ dizziness, NSTEMI. Pt reports transient episode of dizziness last night. Currently lives at living facility. Mild cough. Mild diplopia. Denies any overt syncope. No focal hemiparesis or confusion. + nausea. No overt chest pain or SOB. No reported fall or head trauma. No fevers or chills. Denies any prior episodes like this in the past. Noted to have been seen last week for cough symptoms, placed on antibiotics for a cough.  Presented to ER afebrile, hemodynamically stable. BP stable. Satting well on RA. CBC and CMP grossly WNL. Glu 170s. Trop 65-->279. EKG NSR. CTA chest negative for PE.  Review of Systems: As mentioned in the history of present illness. All other systems reviewed and are negative. Past Medical History:  Diagnosis Date   Anal cancer (HCC)    Anemia    Asthma    CHF (congestive heart failure) (HCC)    Diabetes mellitus    Hyperlipidemia    Hypertension    Past Surgical History:  Procedure Laterality Date   BLADDER SURGERY     CARDIAC CATHETERIZATION     COLOSTOMY     CORONARY ANGIOPLASTY WITH STENT PLACEMENT  03/25/2018   Stent to the LCX  2.50 mm x 16mm  [employer] REF [id number]   heart stent     LEFT HEART CATH AND CORONARY ANGIOGRAPHY N/A 12/23/2021   Procedure: LEFT HEART CATH AND CORONARY ANGIOGRAPHY;  Surgeon: Iran Ouch, MD;  Location: [facility] INVASIVE CV LAB;  Service: Cardiovascular;  Laterality: N/A;   TONSILLECTOMY      uterus surgery     removed   Social History:  reports that she has never smoked. She has never used smokeless tobacco. She reports that she does not drink alcohol and does not use drugs.  Allergies  Allergen Reactions   Levofloxacin Nausea Only    Patient reports that she thinks that she could take this medication.  Patient reports that she thinks that she could take this medication.     Patient reports that she thinks that she could take this medication.   Niacin Other (See Comments)    Hot, Flushed  Pt states she get real red and hot.    Hot, Flushed   Niacin And Related Other (See Comments)    Pt states she get real red and hot.    Family History  Problem Relation Age of Onset   Cancer Mother    Breast cancer Mother    Heart disease Father    Heart attack Father    Cancer Sister    Heart disease Sister    Cervical cancer Sister    Heart disease Brother     Prior to Admission medications   Medication Sig Start Date End Date Taking? Authorizing Provider  acetaminophen (TYLENOL) 500 MG tablet Take 500 mg by mouth every 4 (four) hours as needed.    [provider]  albuterol (VENTOLIN HFA) 108 (90 Base) MCG/ACT inhaler Inhale 1 puff into the lungs every 4 (four) hours as  needed. 11/29/22   [provider]  amiodarone (PACERONE) 200 MG tablet Take 1 tablet (200 mg total) by mouth daily. 07/08/22   Antonieta Iba, MD  amLODipine (NORVASC) 2.5 MG tablet Take 2.5 mg by mouth every evening. 04/27/23 04/26/24  [provider]  atorvastatin (LIPITOR) 40 MG tablet daily.    [provider]  azithromycin (ZITHROMAX Z-PAK) 250 MG tablet Take 2 tablets (500 mg) on  Day 1,  followed by 1 tablet (250 mg) once daily on Days 2 through 5. 06/25/23 06/30/23  Minna Antis, MD  Cholecalciferol 25 MCG (1000 UT) tablet Take 4,000 Units by mouth daily. Pt takes 3 daily    [provider]  citalopram (CELEXA) 40 MG tablet Take by mouth daily.     [provider]  Cyanocobalamin 1000 MCG/ML KIT Inject into the muscle every 30 (thirty) days. 06/05/22   [provider]  doxycycline (VIBRA-TABS) 100 MG tablet Take 1 tablet (100 mg total) by mouth 2 (two) times daily for 10 days. 06/25/23 07/05/23  Merwyn Katos, MD  ezetimibe (ZETIA) 10 MG tablet TAKE 1 TABLET (10 MG TOTAL) BY MOUTH IN THE MORNING. FOR CHOLESTEROL 12/29/22   Antonieta Iba, MD  fexofenadine (ALLEGRA) 60 MG tablet Take 60 mg by mouth daily. 12/18/22 12/18/23  [provider]  levothyroxine (SYNTHROID) 25 MCG tablet Take 25 mcg by mouth daily.    [provider]  magnesium oxide (MAG-OX) 400 MG tablet Take 400 mg by mouth daily.    [provider]  meclizine (ANTIVERT) 25 MG tablet Take 25 mg by mouth 2 (two) times daily as needed.    [provider]  metFORMIN (GLUCOPHAGE) 1000 MG tablet Take by mouth in the morning and at bedtime. 10/02/22 10/02/23  [provider]  metoprolol succinate (TOPROL-XL) 50 MG 24 hr tablet Take 1 tablet (50 mg total) by mouth daily. 07/08/22   Antonieta Iba, MD  montelukast (SINGULAIR) 10 MG tablet Take 10 mg by mouth at bedtime.      [provider]  nitroGLYCERIN (NITROSTAT) 0.4 MG SL tablet Place 1 tablet (0.4 mg total) under the tongue every 5 (five) minutes as needed for chest pain. 12/24/21   Enedina Finner, MD  nystatin cream (MYCOSTATIN) as needed for dry skin.    [provider]  omega-3 acid ethyl esters (LOVAZA) 1 g capsule Take 2 g by mouth 2 (two) times daily.      [provider]  Omega-3 Fatty Acids (FISH OIL) 1000 MG CAPS Take 1,000 mg by mouth daily.    [provider]  omeprazole (PRILOSEC) 40 MG capsule Take 40 mg by mouth daily.      [provider]  pioglitazone (ACTOS) 30 MG tablet Take 30 mg by mouth daily. 12/18/22 12/18/23  [provider]  sertraline (ZOLOFT) 50 MG tablet Take 50 mg by mouth daily. 08/12/22   [provider]  TRULICITY 1.5 MG/0.5ML SOPN Inject into the skin once a week. 12/16/20   [provider]  XARELTO 15 MG TABS tablet TAKE 1 TABLET (15 MG TOTAL) BY MOUTH DAILY. 06/01/23   Antonieta Iba, MD    Physical Exam: Vitals:   06/29/23 0500 06/29/23 0526 06/29/23 0630 06/29/23 0700  BP: (!) 132/47  (!) 103/33   Pulse: 62  (!) 58   Resp: 15  17   Temp:  98.2 F (36.8 C)    TempSrc:  Oral    SpO2: 94%  91%   Height:    5\' 2"  (1.575 m)   Physical Exam Constitutional:      Appearance: She is normal weight.  HENT:     Head: Normocephalic and atraumatic.     Nose: Nose normal.     Mouth/Throat:     Mouth: Mucous membranes are moist.  Eyes:     Pupils: Pupils are equal, round, and reactive to light.  Cardiovascular:     Rate and Rhythm: Normal rate and regular rhythm.  Pulmonary:     Effort: Pulmonary effort is normal.  Abdominal:     General: Bowel sounds are normal.  Musculoskeletal:        General: Normal range of motion.  Skin:    General: Skin is warm.  Neurological:     General: No focal deficit present.  Psychiatric:        Mood and Affect: Mood normal.     Data Reviewed:  There are no new results to review at this time.  CT Angio Chest PE W and/or Wo Contrast CLINICAL DATA:  Pulmonary embolism suspected. History of CHF. Presenting to the ED with dizziness. Double vision. Recent diagnosis of bronchitis.  EXAM: CT ANGIOGRAPHY CHEST WITH CONTRAST  TECHNIQUE: Multidetector CT imaging of the chest was performed using the standard protocol during bolus administration of intravenous contrast. Multiplanar CT image reconstructions and MIPs were obtained to evaluate the vascular anatomy.  RADIATION DOSE REDUCTION: This exam was performed according to the departmental dose-optimization program which includes automated exposure control, adjustment of the mA and/or kV according to patient size and/or use of iterative reconstruction  technique.  CONTRAST:  75mL OMNIPAQUE IOHEXOL 350 MG/ML SOLN  COMPARISON:  Chest radiograph 06/24/2023 and CT chest 10/04/2021  FINDINGS: Cardiovascular: No pericardial effusion. Negative for acute pulmonary embolism. Coronary artery and aortic atherosclerotic calcification.  Mediastinum/Nodes: Trachea and esophagus are unremarkable. No thoracic adenopathy.  Lungs/Pleura: Bibasilar atelectasis/scarring. Mild diffuse bronchial wall thickening. No focal pneumonia, pleural effusion, or pneumothorax. 6 mm nodule in the right lower lobe (3/62) is unchanged since 10/04/2021 compatible with benign etiology. No follow-up is recommended.  Upper Abdomen: Cholecystectomy.  No acute abnormality.  Musculoskeletal: No acute fracture.  Review of the MIP images confirms the above findings.  IMPRESSION: 1. Negative for acute pulmonary embolism. 2. Mild diffuse bronchial wall thickening compatible with bronchitis. 3. Aortic Atherosclerosis (ICD10-I70.0).  Electronically Signed   By: Minerva Fester M.D.   On: 06/29/2023 00:59  Lab Results  Component Value Date   WBC 6.2 06/28/2023   HGB 12.4 06/28/2023   HCT 37.7 06/28/2023   MCV 100.3 (H) 06/28/2023   PLT 256 06/28/2023   Last metabolic panel Lab Results  Component Value Date   GLUCOSE 179 (H) 06/28/2023   NA 135 06/28/2023   K 3.9 06/28/2023   CL 103 06/28/2023   CO2 18 (L) 06/28/2023   BUN 24 (H) 06/28/2023   CREATININE 1.08 (H) 06/28/2023   GFRNONAA 49 (L) 06/28/2023   CALCIUM 9.3 06/28/2023   PROT 7.2 06/28/2023   ALBUMIN 3.6 06/28/2023   LABGLOB 2.7 12/26/2020   AGRATIO 1.3 12/26/2020   BILITOT 1.1 06/28/2023   ALKPHOS 66 06/28/2023   AST 107 (H) 06/28/2023   ALT 64 (H) 06/28/2023   ANIONGAP 14 06/28/2023    Assessment and Plan: * NSTEMI (non-ST elevated myocardial infarction) (HCC) Baseline mild to moderate nonobstructive coronary artery disease with stable angina  Trop 65-->279 in setting of dizziness,  double vision and nausea with concern  for NSTEMI with baseline CAD  CTA negative for PE  S/p ASA  Started on heparin gtt in the ER  Will continue to trend troponin  2D ECHO  Consult cardiology  Follow up recommendations    Dizziness Transient episode of dizziness, diplopia and presyncopal symptoms in setting of active NSTEMI  CT head and MRI brain WNL  Suspect potential cardiac etiology  Orthostatics pending  2D ECHO ordered  Grossly nonfocal neuro exam  Will otherwise monitor for now  Fall precautions  PT/OT evaluation  Follow closely   HTN (hypertension) BP stable  Titrate home regimen    Hyperlipidemia Statin    Stage 3a chronic kidney disease (HCC) Cr 1.08 GFR in the 40s  Near baseline  Monitor    Asthma Stable from a respiratory standpoint Cont home inhalers    HFrEF (heart failure with reduced ejection fraction) (HCC) 2D ECHO 12/2021 w/ EF 40-45%  Euvolemic  Weight today 75.3kg  Monitor volume status    Atrial fibrillation (HCC) Rate controlled at present  On heparin gtt in setting of NSTEMI  Cont home meds including amiodarone   Diabetes mellitus (HCC) Blood sugar 170s  SSI  A1C        Advance Care Planning:   Code Status: Limited: Do not attempt resuscitation (DNR) -DNR-LIMITED -Do Not Intubate/DNI    Consults: Cardiology   Family Communication: No family at the bedside   Severity of Illness: The appropriate patient status for this patient is OBSERVATION. Observation status is judged to be reasonable and necessary in order to provide the required intensity of service to ensure the patient's safety. The patient's presenting symptoms, physical exam findings, and initial radiographic and laboratory data in the context of their medical condition is felt to place them at decreased risk for further clinical deterioration. Furthermore, it is anticipated that the patient will be medically stable for discharge from the hospital within 2 midnights  of admission.   Author: Floydene Flock, MD 06/29/2023 8:03 AM  For on call review [email or web address].

## 2023-06-29 NOTE — Consult Note (Cosign Needed)
 Cardiology Consultation   Patient ID: DEANN MCLAINE MRN: [id number]; DOB: Nov 01, 1932  Admit date: 06/28/2023 Date of Consult: 06/29/2023  PCP:  Jerl Mina, MD   [facility] Providers Cardiologist:  Julien Nordmann, MD   {  Patient Profile:   CARLISSA PESOLA is a 89 y.o. female with a hx of CAD with PCI to left circumflex in December 2019, paroxysmal A-fib, chronic systolic heart failure with recovered EF, hypertension, hyperlipidemia, asthma, diabetes type 2, CKD stage III, anal cancer who is being seen 06/29/2023 for the evaluation of non-STEMI at the request of Dr. Alvester Morin.  History of Present Illness:   Ms. Burgin has a history of CAD with PCI to left circumflex in December 2019 in the setting of non-STEMI while in [place].  Most recent left heart cath in September 2023 in the setting of non-STEMI and EF of 40 to 45%. Cath showed 30% ostial RCA, 40% proximal RCA, 20% distal RCA, left circumflex mid to distal 10%, proximal left circumflex 40%, proximal to mid LAD 50%, ostial to proximal LAD 40%.  Mild to moderate nonobstructive CAD and patent stent, moderate to severely calcified coronary arteries.  Suspected stress-induced cardiomyopathy.  Repeat echo in January 2024 showed EF of 60 to 65%.  Patient has a history of A-fib diagnosed in 2016 in [place].  She is on Xarelto, amiodarone, and Toprol.  Patient was seen in the ER 05/01/2023 with chest pain with negative workup.  She was seen in follow-up 05/08/2023 and denied any further chest pain.  No further cardiac workup was pursued at that point.  The patient presented to the ER 06/28/2023 with dizziness.  Patient got up from her chair from the bathroom and felt like the room was spinning. While she was sitting she felt dizzy, had double visions, and lightheaded. It was worse once she stood up and with head turning. She sat down.  She did not fall or pass out.  She denied chest pain or shortness of breath.  Dizziness lasted for about  an hour.  In the ER blood pressure 149/70, pulse rate 81 bpm, respiratory rate 19, afebrile, 95% O2.  Labs showed serum creatinine 1.08, BUN 24, AST 107, ALT 64.  High-sensitivity troponin 65>279>162>115.  EKG showed normal sinus rhythm with no ischemic changes.  CT head unremarkable.  MRI brain unremarkable.  Chest CTA showed no PE.  She was started on IV heparin and admitted for further workup.  Past Medical History:  Diagnosis Date   Anal cancer (HCC)    Anemia    Asthma    CHF (congestive heart failure) (HCC)    Diabetes mellitus    Hyperlipidemia    Hypertension     Past Surgical History:  Procedure Laterality Date   BLADDER SURGERY     CARDIAC CATHETERIZATION     COLOSTOMY     CORONARY ANGIOPLASTY WITH STENT PLACEMENT  03/25/2018   Stent to the LCX  2.50 mm x 16mm  [employer] REF [id number]   heart stent     LEFT HEART CATH AND CORONARY ANGIOGRAPHY N/A 12/23/2021   Procedure: LEFT HEART CATH AND CORONARY ANGIOGRAPHY;  Surgeon: Iran Ouch, MD;  Location: [facility] INVASIVE CV LAB;  Service: Cardiovascular;  Laterality: N/A;   TONSILLECTOMY     uterus surgery     removed     Home Medications:  Prior to Admission medications   Medication Sig Start Date End Date Taking? Authorizing Provider  acetaminophen (TYLENOL) 500  MG tablet Take 500 mg by mouth every 4 (four) hours as needed.   Yes [provider]  albuterol (VENTOLIN HFA) 108 (90 Base) MCG/ACT inhaler Inhale 1 puff into the lungs every 4 (four) hours as needed. 11/29/22  Yes [provider]  amiodarone (PACERONE) 200 MG tablet Take 1 tablet (200 mg total) by mouth daily. 07/08/22  Yes Antonieta Iba, MD  atorvastatin (LIPITOR) 40 MG tablet daily.   Yes [provider]  benzonatate (TESSALON) 200 MG capsule Take 200 mg by mouth 2 (two) times daily as needed for cough. 06/24/23 07/01/23 Yes [provider]  Cholecalciferol 25 MCG (1000 UT) tablet Take  4,000 Units by mouth daily. Pt takes 3 daily   Yes [provider]  ciprofloxacin (CIPRO) 250 MG tablet Take 250 mg by mouth 2 (two) times daily. 05/27/23  Yes [provider]  doxycycline (VIBRA-TABS) 100 MG tablet Take 1 tablet (100 mg total) by mouth 2 (two) times daily for 10 days. 06/25/23 07/05/23 Yes Merwyn Katos, MD  fexofenadine (ALLEGRA) 60 MG tablet Take 60 mg by mouth daily. 12/18/22 12/18/23 Yes [provider]  levothyroxine (SYNTHROID) 25 MCG tablet Take 25 mcg by mouth daily.   Yes [provider]  meclizine (ANTIVERT) 25 MG tablet Take 25 mg by mouth 2 (two) times daily as needed.   Yes [provider]  metFORMIN (GLUCOPHAGE) 1000 MG tablet Take 500 mg by mouth 2 (two) times daily with a meal. 10/02/22 10/02/23 Yes [provider]  metoprolol succinate (TOPROL-XL) 50 MG 24 hr tablet Take 1 tablet (50 mg total) by mouth daily. 07/08/22  Yes Gollan, Tollie Pizza, MD  montelukast (SINGULAIR) 10 MG tablet Take 10 mg by mouth at bedtime.     Yes [provider]  nitroGLYCERIN (NITROSTAT) 0.4 MG SL tablet Place 1 tablet (0.4 mg total) under the tongue every 5 (five) minutes as needed for chest pain. 12/24/21  Yes Enedina Finner, MD  Omega-3 Fatty Acids (FISH OIL) 1000 MG CAPS Take 1,000 mg by mouth in the morning and at bedtime.   Yes [provider]  omeprazole (PRILOSEC) 40 MG capsule Take 40 mg by mouth daily.     Yes [provider]  pioglitazone (ACTOS) 30 MG tablet Take 30 mg by mouth daily. 12/18/22 12/18/23 Yes [provider]  sertraline (ZOLOFT) 50 MG tablet Take 50 mg by mouth daily. 08/12/22  Yes [provider]  TRULICITY 1.5 MG/0.5ML SOPN Inject into the skin once a week. 12/16/20  Yes [provider]  XARELTO 15 MG TABS tablet TAKE 1 TABLET (15 MG TOTAL) BY MOUTH DAILY. 06/01/23  Yes Antonieta Iba, MD  amLODipine (NORVASC) 2.5 MG tablet Take 2.5 mg by mouth every evening. Patient not  taking: Reported on 06/29/2023 04/27/23 04/26/24  [provider]  azithromycin (ZITHROMAX Z-PAK) 250 MG tablet Take 2 tablets (500 mg) on  Day 1,  followed by 1 tablet (250 mg) once daily on Days 2 through 5. Patient not taking: Reported on 06/29/2023 06/25/23 06/30/23  Minna Antis, MD  citalopram (CELEXA) 40 MG tablet Take by mouth daily. Patient not taking: Reported on 06/29/2023    [provider]  Cyanocobalamin 1000 MCG/ML KIT Inject into the muscle every 30 (thirty) days. Patient not taking: Reported on 06/29/2023 06/05/22   [provider]  ezetimibe (ZETIA) 10 MG tablet TAKE 1 TABLET (10 MG TOTAL) BY MOUTH IN THE MORNING. FOR CHOLESTEROL Patient not taking: Reported on  06/29/2023 12/29/22   Antonieta Iba, MD  magnesium oxide (MAG-OX) 400 MG tablet Take 400 mg by mouth daily. Patient not taking: Reported on 06/29/2023    [provider]  nystatin cream (MYCOSTATIN) as needed for dry skin. Patient not taking: Reported on 06/29/2023    [provider]  omega-3 acid ethyl esters (LOVAZA) 1 g capsule Take 2 g by mouth 2 (two) times daily.      [provider]    Inpatient Medications: Scheduled Meds:  insulin aspart  0-9 Units Subcutaneous TID WC   Continuous Infusions:  heparin 800 Units/hr (06/29/23 0139)   PRN Meds: acetaminophen, ondansetron (ZOFRAN) IV  Allergies:    Allergies  Allergen Reactions   Levofloxacin Nausea Only    Patient reports that she thinks that she could take this medication.  Patient reports that she thinks that she could take this medication.     Patient reports that she thinks that she could take this medication.   Niacin Other (See Comments)    Hot, Flushed  Pt states she get real red and hot.    Hot, Flushed   Niacin And Related Other (See Comments)    Pt states she get real red and hot.    Social History:   Social History   Socioeconomic History   Marital status: Single    Spouse  name: Not on file   Number of children: Not on file   Years of education: Not on file   Highest education level: Not on file  Occupational History   Not on file  Tobacco Use   Smoking status: Never   Smokeless tobacco: Never  Vaping Use   Vaping status: Never Used  Substance and Sexual Activity   Alcohol use: No   Drug use: No   Sexual activity: Not on file  Other Topics Concern   Not on file  Social History Narrative   Not on file   Social Drivers of Health   Financial Resource Strain: Medium Risk (05/20/2023)   Received from [facility]   Overall Financial Resource Strain (CARDIA)    Difficulty of Paying Living Expenses: Somewhat hard  Food Insecurity: No Food Insecurity (06/29/2023)   Hunger Vital Sign    Worried About Running Out of Food in the Last Year: Never true    Ran Out of Food in the Last Year: Never true  Transportation Needs: No Transportation Needs (06/29/2023)   PRAPARE - Administrator, Civil Service (Medical): No    Lack of Transportation (Non-Medical): No  Physical Activity: Not on file  Stress: Not on file  Social Connections: Moderately Integrated (06/29/2023)   Social Connection and Isolation Panel [NHANES]    Frequency of Communication with Friends and Family: More than three times a week    Frequency of Social Gatherings with Friends and Family: More than three times a week    Attends Religious Services: More than 4 times per year    Active Member of [employer] or Organizations: Yes    Attends Banker Meetings: More than 4 times per year    Marital Status: Widowed  Intimate Partner Violence: Not At Risk (06/29/2023)   Humiliation, Afraid, Rape, and Kick questionnaire    Fear of Current or Ex-Partner: No    Emotionally Abused: No    Physically Abused: No    Sexually Abused: No    Family History:    Family History  Problem Relation Age of Onset  Cancer Mother    Breast cancer Mother    Heart disease Father     Heart attack Father    Cancer Sister    Heart disease Sister    Cervical cancer Sister    Heart disease Brother      ROS:  Please see the history of present illness.   All other ROS reviewed and negative.     Physical Exam/Data:   Vitals:   06/29/23 1300 06/29/23 1305 06/29/23 1500 06/29/23 1606  BP: (!) 148/51  (!) 100/43 (!) 114/45  Pulse: 66  (!) 57 (!) 59  Resp: (!) 23  15 17   Temp:  98.1 F (36.7 C)  97.8 F (36.6 C)  TempSrc:  Oral    SpO2: 95%   96%  Height:        Intake/Output Summary (Last 24 hours) at 06/29/2023 1617 Last data filed at 06/29/2023 1500 Gross per 24 hour  Intake --  Output 100 ml  Net -100 ml      06/24/2023    7:38 PM 05/08/2023    2:31 PM 05/01/2023    1:58 PM  Last 3 Weights  Weight (lbs) 166 lb 174 lb 9.6 oz 182 lb 8.7 oz  Weight (kg) 75.297 kg 79.198 kg 82.8 kg     Body mass index is 30.36 kg/m.  General:  Well nourished, well developed, in no acute distress HEENT: normal Neck: no JVD Vascular: No carotid bruits; Distal pulses 2+ bilaterally Cardiac:  normal S1, S2; RRR; no murmur  Lungs:  clear to auscultation bilaterally, no wheezing, rhonchi or rales  Abd: soft, nontender, no hepatomegaly  Ext: no edema Musculoskeletal:  No deformities, BUE and BLE strength normal and equal Skin: warm and dry  Neuro:  CNs 2-12 intact, no focal abnormalities noted Psych:  Normal affect   EKG:  The EKG was personally reviewed and demonstrates: Normal sinus rhythm, 83 bpm, nonspecific st/T wave changes Telemetry:  Telemetry was personally reviewed and demonstrates:  SB 50s  Relevant CV Studies:  Echo 06/2023 1. Left ventricular ejection fraction, by estimation, is 55 to 60%. The  left ventricle has normal function. The left ventricle has no regional  wall motion abnormalities. There is moderate left ventricular hypertrophy.  Left ventricular diastolic  parameters are consistent with Grade I diastolic dysfunction (impaired  relaxation).  The global longitudinal strain is abnormal.   2. Right ventricular systolic function is normal. The right ventricular  size is normal. There is normal pulmonary artery systolic pressure.   3. Left atrial size was mildly dilated.   4. The mitral valve is abnormal. No evidence of mitral valve  regurgitation. No evidence of mitral stenosis. Severe mitral annular  calcification.   5. The aortic valve is normal in structure. Aortic valve regurgitation is  not visualized. Aortic valve sclerosis/calcification is present, without  any evidence of aortic stenosis.   6. The inferior vena cava is normal in size with greater than 50%  respiratory variability, suggesting right atrial pressure of 3 mmHg.   Echo limited 04/2022 1. Left ventricular ejection fraction, by estimation, is 60 to 65%. The  left ventricle has normal function. The left ventricle has no regional  wall motion abnormalities. There is moderate left ventricular hypertrophy.  Left ventricular diastolic  parameters are indeterminate.   2. Right ventricular systolic function is normal. The right ventricular  size is normal.   3. The mitral valve is normal in structure. No evidence of mitral valve  regurgitation. No  evidence of mitral stenosis.   4. The aortic valve is normal in structure. Aortic valve regurgitation is  not visualized. No aortic stenosis is present.   5. The inferior vena cava is normal in size with greater than 50%  respiratory variability, suggesting right atrial pressure of 3 mmHg.   Comparison(s): 12/21/21 LVEF 40-45% with mildly decreased function.   FINDINGS  LHC 12/2021   Ost RCA lesion is 30% stenosed.   Prox RCA lesion is 40% stenosed.   Dist RCA lesion is 20% stenosed.   Mid Cx to Dist Cx lesion is 10% stenosed.   Prox Cx lesion is 40% stenosed.   Prox LAD to Mid LAD lesion is 50% stenosed.   Ost LAD to Prox LAD lesion is 40% stenosed.   1.  Mild to moderate nonobstructive coronary artery disease.   Patent left circumflex stent with no significant restenosis.  Moderately to severely calcified coronary arteries. 2.  Left ventricular angiography was not performed due to chronic kidney disease.  EF was mildly reduced by echo.  Mildly elevated left ventricular end-diastolic pressure.   Recommendations: No clear culprit is identified for elevated troponin.  Possible stress-induced cardiomyopathy.  Continue medical therapy.  Resume Xarelto later today. The patient can likely be discharged home in the afternoon if she remains stable. 33 mL of contrast was used for the procedure.   Echo 12/2021  1. Left ventricular ejection fraction, by estimation, is 40 to 45%. The  left ventricle has mildly decreased function. The left ventricle  demonstrates regional wall motion abnormalities (hypokinesis of teh  anterior/anteroseptal and apical region). Left  ventricular diastolic parameters are indeterminate.   2. Right ventricular systolic function is normal. The right ventricular  size is normal. Tricuspid regurgitation signal is inadequate for assessing  PA pressure.   3. The mitral valve is normal in structure. No evidence of mitral valve  regurgitation. No evidence of mitral stenosis. Moderate mitral annular  calcification.   4. The aortic valve is normal in structure. Aortic valve regurgitation is  not visualized. No aortic stenosis is present.   5. The inferior vena cava is normal in size with greater than 50%  respiratory variability, suggesting right atrial pressure of 3 mmHg.   Laboratory Data:  High Sensitivity Troponin:   Recent Labs  Lab 06/24/23 2201 06/28/23 1811 06/28/23 2322 06/29/23 0841 06/29/23 1210  TROPONINIHS 27* 65* 279* 162* 115*     Chemistry Recent Labs  Lab 06/24/23 1235 06/24/23 1942 06/28/23 1811  NA 135 135 135  K 4.3 4.5 3.9  CL 101 103 103  CO2 22 19* 18*  GLUCOSE 172* 216* 179*  BUN 19 23 24*  CREATININE 1.16* 1.79* 1.08*  CALCIUM 9.5 9.4 9.3   GFRNONAA 45* 27* 49*  ANIONGAP 12 13 14     Recent Labs  Lab 06/24/23 1235 06/28/23 1811  PROT 7.6 7.2  ALBUMIN 4.0 3.6  AST 86* 107*  ALT 52* 64*  ALKPHOS 72 66  BILITOT 1.2 1.1   Lipids No results for input(s): "CHOL", "TRIG", "HDL", "LABVLDL", "LDLCALC", "CHOLHDL" in the last 168 hours.  Hematology Recent Labs  Lab 06/24/23 1235 06/24/23 1942 06/28/23 1811  WBC 5.8 7.0 6.2  RBC 3.91 3.80* 3.76*  HGB 12.7 12.7 12.4  HCT 38.2 37.8 37.7  MCV 97.7 99.5 100.3*  MCH 32.5 33.4 33.0  MCHC 33.2 33.6 32.9  RDW 14.6 14.6 14.5  PLT 302 321 256   Thyroid No results for input(s): "TSH", "FREET4" in the  last 168 hours.  BNP Recent Labs  Lab 06/24/23 1617  BNP 102.7*    DDimer No results for input(s): "DDIMER" in the last 168 hours.   Radiology/Studies:  ECHOCARDIOGRAM COMPLETE Result Date: 06/29/2023    ECHOCARDIOGRAM REPORT   Patient Name:   DANUTA HUSEMAN Date of Exam: 06/29/2023 Medical Rec #:  [id number]      Height:       62.0 in Accession #:    [id number]     Weight:       166.0 lb Date of Birth:  04-Mar-1933      BSA:          1.766 m Patient Age:    89 years       BP:           168/75 mmHg Patient Gender: F              HR:           58 bpm. Exam Location:  [facility] Procedure: 2D Echo, Cardiac Doppler, Color Doppler and Strain Analysis (Both            Spectral and Color Flow Doppler were utilized during procedure). Indications:     NSTEMI  History:         Patient has prior history of Echocardiogram examinations, most                  recent 04/22/2022. CHF and Cardiomyopathy, Acute MI and CAD,                  Arrythmias:Atrial Fibrillation,                  Signs/Symptoms:Dizziness/Lightheadedness; Risk                  Factors:Hypertension, Diabetes and Dyslipidemia.  Sonographer:     Mikki Harbor Referring Phys:  [phone] Francoise Schaumann [place] Diagnosing Phys: Lorine Bears MD  Sonographer Comments: Global longitudinal strain was attempted. CKD IMPRESSIONS  1. Left ventricular ejection  fraction, by estimation, is 55 to 60%. The left ventricle has normal function. The left ventricle has no regional wall motion abnormalities. There is moderate left ventricular hypertrophy. Left ventricular diastolic parameters are consistent with Grade I diastolic dysfunction (impaired relaxation). The global longitudinal strain is abnormal.  2. Right ventricular systolic function is normal. The right ventricular size is normal. There is normal pulmonary artery systolic pressure.  3. Left atrial size was mildly dilated.  4. The mitral valve is abnormal. No evidence of mitral valve regurgitation. No evidence of mitral stenosis. Severe mitral annular calcification.  5. The aortic valve is normal in structure. Aortic valve regurgitation is not visualized. Aortic valve sclerosis/calcification is present, without any evidence of aortic stenosis.  6. The inferior vena cava is normal in size with greater than 50% respiratory variability, suggesting right atrial pressure of 3 mmHg. FINDINGS  Left Ventricle: Left ventricular ejection fraction, by estimation, is 55 to 60%. The left ventricle has normal function. The left ventricle has no regional wall motion abnormalities. Strain was performed and the global longitudinal strain is abnormal. Global longitudinal strain performed but not reported based on interpreter judgement due to suboptimal tracking. The left ventricular internal cavity size was normal in size. There is moderate left ventricular hypertrophy. Left ventricular diastolic parameters are consistent with Grade I diastolic dysfunction (impaired relaxation). Right Ventricle: The right ventricular size is normal. No increase in right ventricular wall thickness. Right ventricular systolic function is  normal. There is normal pulmonary artery systolic pressure. The tricuspid regurgitant velocity is 1.98 m/s, and  with an assumed right atrial pressure of 3 mmHg, the estimated right ventricular systolic pressure is 18.7  mmHg. Left Atrium: Left atrial size was mildly dilated. Right Atrium: Right atrial size was normal in size. Pericardium: There is no evidence of pericardial effusion. Mitral Valve: The mitral valve is abnormal. There is moderate thickening of the mitral valve leaflet(s). There is severe calcification of the mitral valve leaflet(s). Severe mitral annular calcification. No evidence of mitral valve regurgitation. No evidence of mitral valve stenosis. MV peak gradient, 4.0 mmHg. The mean mitral valve gradient is 1.0 mmHg. Tricuspid Valve: The tricuspid valve is normal in structure. Tricuspid valve regurgitation is mild . No evidence of tricuspid stenosis. Aortic Valve: The aortic valve is normal in structure. Aortic valve regurgitation is not visualized. Aortic valve sclerosis/calcification is present, without any evidence of aortic stenosis. Aortic valve mean gradient measures 5.0 mmHg. Aortic valve peak  gradient measures 8.6 mmHg. Aortic valve area, by VTI measures 1.99 cm. Pulmonic Valve: The pulmonic valve was normal in structure. Pulmonic valve regurgitation is mild. No evidence of pulmonic stenosis. Aorta: The aortic root is normal in size and structure. Venous: The inferior vena cava is normal in size with greater than 50% respiratory variability, suggesting right atrial pressure of 3 mmHg. IAS/Shunts: No atrial level shunt detected by color flow Doppler.  LEFT VENTRICLE PLAX 2D LVIDd:         3.80 cm     Diastology LVIDs:         1.90 cm     LV e' medial:    4.03 cm/s LV PW:         1.40 cm     LV E/e' medial:  16.1 LV IVS:        1.50 cm     LV e' lateral:   6.20 cm/s LVOT diam:     1.90 cm     LV E/e' lateral: 10.5 LV SV:         70 LV SV Index:   39 LVOT Area:     2.84 cm  LV Volumes (MOD) LV vol d, MOD A2C: 43.3 ml LV vol d, MOD A4C: 44.5 ml LV vol s, MOD A2C: 14.3 ml LV vol s, MOD A4C: 17.6 ml LV SV MOD A2C:     29.0 ml LV SV MOD A4C:     44.5 ml LV SV MOD BP:      28.8 ml RIGHT VENTRICLE RV Basal diam:   3.00 cm RV Mid diam:    3.10 cm RV S prime:     10.90 cm/s TAPSE (M-mode): 2.4 cm LEFT ATRIUM             Index        RIGHT ATRIUM           Index LA diam:        4.20 cm 2.38 cm/m   RA Area:     14.60 cm LA Vol (A2C):   40.3 ml 22.82 ml/m  RA Volume:   34.10 ml  19.31 ml/m LA Vol (A4C):   46.5 ml 26.33 ml/m LA Biplane Vol: 44.6 ml 25.25 ml/m  AORTIC VALVE                     PULMONIC VALVE AV Area (Vmax):    1.85 cm      PV Vmax:  0.89 m/s AV Area (Vmean):   1.79 cm      PV Peak grad:  3.2 mmHg AV Area (VTI):     1.99 cm AV Vmax:           147.00 cm/s AV Vmean:          103.000 cm/s AV VTI:            0.351 m AV Peak Grad:      8.6 mmHg AV Mean Grad:      5.0 mmHg LVOT Vmax:         95.80 cm/s LVOT Vmean:        65.000 cm/s LVOT VTI:          0.246 m LVOT/AV VTI ratio: 0.70  AORTA Ao Root diam: 3.30 cm Ao Asc diam:  3.20 cm MITRAL VALVE               TRICUSPID VALVE MV Area (PHT): 1.87 cm    TR Peak grad:   15.7 mmHg MV Area VTI:   2.02 cm    TR Vmax:        198.00 cm/s MV Peak grad:  4.0 mmHg MV Mean grad:  1.0 mmHg    SHUNTS MV Vmax:       1.00 m/s    Systemic VTI:  0.25 m MV Vmean:      49.5 cm/s   Systemic Diam: 1.90 cm MV Decel Time: 405 msec MV E velocity: 65.00 cm/s MV A velocity: 95.40 cm/s MV E/A ratio:  0.68 Lorine Bears MD Electronically signed by Lorine Bears MD Signature Date/Time: 06/29/2023/2:47:18 PM    Final    CT Angio Chest PE W and/or Wo Contrast Result Date: 06/29/2023 CLINICAL DATA:  Pulmonary embolism suspected. History of CHF. Presenting to the ED with dizziness. Double vision. Recent diagnosis of bronchitis. EXAM: CT ANGIOGRAPHY CHEST WITH CONTRAST TECHNIQUE: Multidetector CT imaging of the chest was performed using the standard protocol during bolus administration of intravenous contrast. Multiplanar CT image reconstructions and MIPs were obtained to evaluate the vascular anatomy. RADIATION DOSE REDUCTION: This exam was performed according to the departmental  dose-optimization program which includes automated exposure control, adjustment of the mA and/or kV according to patient size and/or use of iterative reconstruction technique. CONTRAST:  75mL OMNIPAQUE IOHEXOL 350 MG/ML SOLN COMPARISON:  Chest radiograph 06/24/2023 and CT chest 10/04/2021 FINDINGS: Cardiovascular: No pericardial effusion. Negative for acute pulmonary embolism. Coronary artery and aortic atherosclerotic calcification. Mediastinum/Nodes: Trachea and esophagus are unremarkable. No thoracic adenopathy. Lungs/Pleura: Bibasilar atelectasis/scarring. Mild diffuse bronchial wall thickening. No focal pneumonia, pleural effusion, or pneumothorax. 6 mm nodule in the right lower lobe (3/62) is unchanged since 10/04/2021 compatible with benign etiology. No follow-up is recommended. Upper Abdomen: Cholecystectomy.  No acute abnormality. Musculoskeletal: No acute fracture. Review of the MIP images confirms the above findings. IMPRESSION: 1. Negative for acute pulmonary embolism. 2. Mild diffuse bronchial wall thickening compatible with bronchitis. 3. Aortic Atherosclerosis (ICD10-I70.0). Electronically Signed   By: Minerva Fester M.D.   On: 06/29/2023 00:59   MR BRAIN WO CONTRAST Result Date: 06/28/2023 CLINICAL DATA:  Transient ischemic attack. Dizziness this afternoon. EXAM: MRI HEAD WITHOUT CONTRAST TECHNIQUE: Multiplanar, multiecho pulse sequences of the brain and surrounding structures were obtained without intravenous contrast. COMPARISON:  Head CT earlier same day FINDINGS: Brain: Diffusion imaging does not show any acute or subacute infarction or other cause of restricted diffusion. No focal abnormality affects the brainstem or cerebellum. Cerebral hemispheres show mild  age related volume loss and only a few punctate foci of T2 and FLAIR signal in the white matter consistent with minimal small vessel change, less than often seen at this age. No cortical or large vessel territory stroke. No mass,  hemorrhage, hydrocephalus or extra-axial collection. Vascular: Major vessels at the base of the brain show flow. Skull and upper cervical spine: Negative Sinuses/Orbits: Clear/normal Other: No fluid in the middle ears or mastoid air cells. IMPRESSION: No acute or reversible finding. Mild age related volume loss. Minimal small vessel change of the cerebral hemispheric white matter, less than often seen at this age. Electronically Signed   By: Paulina Fusi M.D.   On: 06/28/2023 21:24   CT Head Wo Contrast Result Date: 06/28/2023 CLINICAL DATA:  Central vertigo.  Dizziness. EXAM: CT HEAD WITHOUT CONTRAST TECHNIQUE: Contiguous axial images were obtained from the base of the skull through the vertex without intravenous contrast. RADIATION DOSE REDUCTION: This exam was performed according to the departmental dose-optimization program which includes automated exposure control, adjustment of the mA and/or kV according to patient size and/or use of iterative reconstruction technique. COMPARISON:  03/06/2023 FINDINGS: Brain: Age related volume loss. Minimal small vessel change of the hemispheric white matter. No sign of acute infarction, mass lesion, hemorrhage, hydrocephalus or extra-axial collection. Vascular: There is atherosclerotic calcification of the major vessels at the base of the brain. Skull: Negative Sinuses/Orbits: Clear/normal Other: No fluid in the middle ears or mastoid air cells. IMPRESSION: No acute CT finding. Age related volume loss. Minimal small vessel change of the hemispheric white matter. Electronically Signed   By: Paulina Fusi M.D.   On: 06/28/2023 18:33     Assessment and Plan:   NSTEMI CAD s/p PCI Lcx in 2019 - most recent cath in 2023 in the setting of NSTEMI and mildly reduced EF showed nonobstructive cAD and patent stent - presented with dizziness, lightheadedness, double vision, N/V x 3 - later reported SOB, no chest pain. Chest CTA negative for PE - HS trop 62>278>>115 started  on IV heparin - echo ordered, showed LVEF 55-60%, no WMA - continue to trend troponin to peak - continue IV heparin x 48 hours - can consider MPI tomorrow. 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 May need to hold PTA amlodipine - CT head unremarkable, MRI head unremarkable - Tele is unremarkable - will order carotid US  HTN - Bps labile, but orthostatic as above - PTA Toprol and amlodipine at night   For questions or updates, please contact [facility] Please consult [email or web address] for contact info under    Signed, Brenan Modesto David Stall, PA-C  06/29/2023 4:17 PM

## 2023-06-29 NOTE — ED Provider Notes (Signed)
 Care of this patient assumed from prior physician at 2300 pending repeat troponin and disposition. Please see prior physician note for further details.  Briefly this is an 89 year old female with history of HTN, diabetes, CHF who presented with a resolved episode of dizziness.  Her initial labs were notable for an elevated troponin at 65, increased from 4 days ago.  CT head and MRI unrevealing.  Signed out to me pending repeat troponin with possible discharge if troponin remained unchanged.  12:24 AM Repeat troponin returned elevated at 279.  With significant increase, I am concerned about cardiac etiology of her dizziness.  Patient was reassessed.  Awake, overall feels well.  When probed further, she does report that she has been having some exertional shortness of breath which is new for her.  Will obtain a CTA of the chest to evaluate for PE.  Even if this is negative, anticipate she will be appropriate for admission for further evaluation of her lightheadedness with rising troponin.  1:15 AM CTA without evidence of PE.  Will reach out to hospitalist team to discuss admission in the setting for rising troponin.  Heparin drip ordered.  1:40 AM Case reviewed with Dr. Para March.  She will evaluate the patient for anticipated admission.   Trinna Post, MD 06/29/23 [phone]

## 2023-06-29 NOTE — Assessment & Plan Note (Signed)
 Stable from a respiratory standpoint  Cont home inhalers

## 2023-06-29 NOTE — Progress Notes (Signed)
*  PRELIMINARY RESULTS* Echocardiogram 2D Echocardiogram has been performed.  Sarah Potts 06/29/2023, 1:16 PM

## 2023-06-29 NOTE — Assessment & Plan Note (Signed)
 Blood sugar 170s  SSI  A1C

## 2023-06-29 NOTE — Assessment & Plan Note (Signed)
 Statin

## 2023-06-29 NOTE — Assessment & Plan Note (Signed)
 2D ECHO 12/2021 w/ EF 40-45%  Euvolemic  Weight today 75.3kg  Monitor volume status

## 2023-06-29 NOTE — Assessment & Plan Note (Addendum)
 Transient episode of dizziness, diplopia and presyncopal symptoms in setting of active NSTEMI  CT head and MRI brain WNL  Suspect potential cardiac etiology  Orthostatics pending  2D ECHO ordered  Grossly nonfocal neuro exam  Will otherwise monitor for now  Fall precautions  PT/OT evaluation  Follow closely

## 2023-06-29 NOTE — Assessment & Plan Note (Signed)
 Cr 1.08 GFR in the 40s  Near baseline  Monitor

## 2023-06-29 NOTE — Assessment & Plan Note (Signed)
 BP stable Titrate home regimen

## 2023-06-29 NOTE — Progress Notes (Signed)
 PHARMACY - ANTICOAGULATION CONSULT NOTE  Pharmacy Consult for Heparin  Indication: chest pain/ACS  Allergies  Allergen Reactions   Levofloxacin Nausea Only    Patient reports that she thinks that she could take this medication.  Patient reports that she thinks that she could take this medication.     Patient reports that she thinks that she could take this medication.   Niacin Other (See Comments)    Hot, Flushed  Pt states she get real red and hot.    Hot, Flushed   Niacin And Related Other (See Comments)    Pt states she get real red and hot.    Patient Measurements: Height: 5\' 2"  (157.5 cm) IBW/kg (Calculated) : 50.1 Heparin Dosing Weight: 66 kg   Vital Signs: Temp: 97.8 F (36.6 C) (03/17 1606) Temp Source: Oral (03/17 1305) BP: 114/45 (03/17 1606) Pulse Rate: 59 (03/17 1606)  Labs: Recent Labs    06/28/23 1811 06/28/23 2322 06/29/23 0125 06/29/23 0147 06/29/23 0841 06/29/23 1210 06/29/23 1700  HGB 12.4  --   --   --   --   --   --   HCT 37.7  --   --   --   --   --   --   PLT 256  --   --   --   --   --   --   APTT  --   --  40*  --  69*  --  120*  LABPROT  --   --  20.7*  --   --   --   --   INR  --   --  1.8*  --   --   --   --   HEPARINUNFRC  --   --   --  >1.10* >1.10*  --   --   CREATININE 1.08*  --   --   --   --   --   --   TROPONINIHS 65* 279*  --   --  162* 115*  --     Estimated Creatinine Clearance: 34.2 mL/min (A) (by C-G formula based on SCr of 1.08 mg/dL (H)).   Medical History: Past Medical History:  Diagnosis Date   Anal cancer (HCC)    Anemia    Asthma    CHF (congestive heart failure) (HCC)    Diabetes mellitus    Hyperlipidemia    Hypertension     Medications:  (Not in a hospital admission)   Assessment: Pharmacy consulted to dose heparin in this 89 year old female admitted with ACS/NSTEMI. Pt was on Xarelto 15 mg PO daily PTA for Afib. CHA2DS2VASc at least 6 (CHF, HTN, age +5, DM, female). Last dose Sunday 3/16 @  1000.   CrCl = 34.2 ml/min   Goal of Therapy:  Heparin level 0.3-0.7 units/ml aPTT 66 - 102  seconds Monitor platelets by anticoagulation protocol: Yes  Date Time aPTT/HL Rate/Comment 3/17 0841 69 seconds Therapeutic x 1 3/17 1700 120 seconds Supratherapeutic       Plan:  Reduce heparin infusion rate to 650 units/hour. Check aPTT in 8 hours Check heparin level with AM labs tomorrow Continue to monitor CBC daily while on heparin infusion.  Will M. Dareen Piano, PharmD Clinical Pharmacist 06/29/2023 5:54 PM

## 2023-06-30 ENCOUNTER — Inpatient Hospital Stay (HOSPITAL_COMMUNITY)

## 2023-06-30 DIAGNOSIS — I214 Non-ST elevation (NSTEMI) myocardial infarction: Secondary | ICD-10-CM | POA: Diagnosis not present

## 2023-06-30 LAB — NM MYOCAR MULTI W/SPECT W/WALL MOTION / EF
LV dias vol: 41 mL (ref 46–106)
LV sys vol: 5 mL
Nuc Stress EF: 88 %
Peak HR: 76 {beats}/min
Rest HR: 62 {beats}/min
Rest Nuclear Isotope Dose: 10.4 mCi
SDS: 0
SRS: 0
SSS: 0
ST Depression (mm): 0 mm
Stress Nuclear Isotope Dose: 30.6 mCi
TID: 1.06

## 2023-06-30 LAB — CBC
HCT: 31.9 % — ABNORMAL LOW (ref 36.0–46.0)
Hemoglobin: 10.6 g/dL — ABNORMAL LOW (ref 12.0–15.0)
MCH: 33.2 pg (ref 26.0–34.0)
MCHC: 33.2 g/dL (ref 30.0–36.0)
MCV: 100 fL (ref 80.0–100.0)
Platelets: 218 10*3/uL (ref 150–400)
RBC: 3.19 MIL/uL — ABNORMAL LOW (ref 3.87–5.11)
RDW: 14.4 % (ref 11.5–15.5)
WBC: 4.9 10*3/uL (ref 4.0–10.5)
nRBC: 0 % (ref 0.0–0.2)

## 2023-06-30 LAB — HEPARIN LEVEL (UNFRACTIONATED): Heparin Unfractionated: 0.37 [IU]/mL (ref 0.30–0.70)

## 2023-06-30 LAB — CBG MONITORING, ED
Glucose-Capillary: 111 mg/dL — ABNORMAL HIGH (ref 70–99)
Glucose-Capillary: 116 mg/dL — ABNORMAL HIGH (ref 70–99)
Glucose-Capillary: 183 mg/dL — ABNORMAL HIGH (ref 70–99)

## 2023-06-30 LAB — APTT
aPTT: 112 s — ABNORMAL HIGH (ref 24–36)
aPTT: 73 s — ABNORMAL HIGH (ref 24–36)

## 2023-06-30 LAB — GLUCOSE, CAPILLARY
Glucose-Capillary: 126 mg/dL — ABNORMAL HIGH (ref 70–99)
Glucose-Capillary: 160 mg/dL — ABNORMAL HIGH (ref 70–99)

## 2023-06-30 MED ORDER — RIVAROXABAN 15 MG PO TABS
15.0000 mg | ORAL_TABLET | Freq: Every day | ORAL | Status: DC
  Administered 2023-06-30: 15 mg via ORAL
  Filled 2023-06-30 (×2): qty 1

## 2023-06-30 MED ORDER — REGADENOSON 0.4 MG/5ML IV SOLN
0.4000 mg | Freq: Once | INTRAVENOUS | Status: AC
Start: 2023-06-30 — End: 2023-06-30
  Administered 2023-06-30: 0.4 mg via INTRAVENOUS

## 2023-06-30 MED ORDER — TECHNETIUM TC 99M TETROFOSMIN IV KIT
30.6200 | PACK | Freq: Once | INTRAVENOUS | Status: AC | PRN
  Administered 2023-06-30: 30.62 via INTRAVENOUS

## 2023-06-30 MED ORDER — TECHNETIUM TC 99M TETROFOSMIN IV KIT
10.3500 | PACK | Freq: Once | INTRAVENOUS | Status: AC | PRN
Start: 2023-06-30 — End: 2023-06-30
  Administered 2023-06-30: 10.35 via INTRAVENOUS

## 2023-06-30 NOTE — Progress Notes (Signed)
 PHARMACY - ANTICOAGULATION CONSULT NOTE  Pharmacy Consult for Heparin  Indication: chest pain/ACS  Allergies  Allergen Reactions   Levofloxacin Nausea Only    Patient reports that she thinks that she could take this medication.  Patient reports that she thinks that she could take this medication.     Patient reports that she thinks that she could take this medication.   Niacin Other (See Comments)    Hot, Flushed  Pt states she get real red and hot.    Hot, Flushed   Niacin And Related Other (See Comments)    Pt states she get real red and hot.    Patient Measurements: Height: 5\' 2"  (157.5 cm) IBW/kg (Calculated) : 50.1 Heparin Dosing Weight: 66 kg   Vital Signs: Temp: 97.7 F (36.5 C) (03/18 1227) Temp Source: Oral (03/18 1227) BP: 151/62 (03/18 1300) Pulse Rate: 66 (03/18 1300)  Labs: Recent Labs    06/28/23 1811 06/28/23 2322 06/29/23 0125 06/29/23 0125 06/29/23 0147 06/29/23 0841 06/29/23 1210 06/29/23 1700 06/30/23 0148 06/30/23 1226  HGB 12.4  --   --   --   --   --   --   --  10.6*  --   HCT 37.7  --   --   --   --   --   --   --  31.9*  --   PLT 256  --   --   --   --   --   --   --  218  --   APTT  --   --  40*   < >  --  69*  --  120* 112* 73*  LABPROT  --   --  20.7*  --   --   --   --   --   --   --   INR  --   --  1.8*  --   --   --   --   --   --   --   HEPARINUNFRC  --   --   --   --  >1.10* >1.10*  --   --   --  0.37  CREATININE 1.08*  --   --   --   --   --   --   --   --   --   TROPONINIHS 65* 279*  --   --   --  162* 115*  --   --   --    < > = values in this interval not displayed.    Estimated Creatinine Clearance: 34.2 mL/min (A) (by C-G formula based on SCr of 1.08 mg/dL (H)).   Medical History: Past Medical History:  Diagnosis Date   Anal cancer (HCC)    Anemia    Asthma    CHF (congestive heart failure) (HCC)    Diabetes mellitus    Hyperlipidemia    Hypertension     Medications:  (Not in a hospital  admission)   Assessment: Pharmacy consulted to dose heparin in this 89 year old female admitted with ACS/NSTEMI. Pt was on Xarelto 15 mg PO daily PTA for Afib. CHA2DS2VASc at least 6 (CHF, HTN, age +12, DM, female). Last dose Sunday 3/16 @ 1000.   CrCl = 34.2 ml/min   Goal of Therapy:  Heparin level 0.3-0.7 units/ml aPTT 66 - 102  seconds Monitor platelets by anticoagulation protocol: Yes  Date Time aPTT/HL Rate/Comment 3/17 0841 69/--  Therapeutic x 1 3/17 1700 120/--  Supratherapeutic 3/18 0148 112/--  Supratherapeutic 3/18 1226 73/0.37 Therapeutic x 1       Plan:  Heparin level and aPTT level therapeutic x 1 Heparin level and aPTT level correlating, can transition to heparin level monitoring  Continue heparin infusion rate of 600 units/hour. Check HL in 8 hours Continue to monitor CBC daily while on heparin infusion.  Paulita Fujita, PharmD Clinical Pharmacist  06/30/2023 1:12 PM

## 2023-06-30 NOTE — Evaluation (Signed)
 Physical Therapy Evaluation Patient Details Name: Sarah Potts MRN: [id number] DOB: September 14, 1932 Today's Date: 06/30/2023  History of Present Illness  presented to ER secondary to dizziness; admitted for management of NSTEMI (peak troponin 279, managed with hep drip)  Clinical Impression  Patient resting in bed upon arrival to room; alert and oriented, follows commands and agreeable to participation with session.  Denies pain, noting improvement in symptoms since admission.  Bilat UE/LE strength and ROM grossly symmetrical and WFL; no focal weakness appreciated.  Able to complete bed mobility with mod indep; sit/stand, basic transfers and gait (150') with RW, sup/mod indep.  Demonstrates reciprocal stepping pattern with fair step height/length; fair cadence. Confident gait pattern, feels at/near baseline. Denies dizziness, lightheadedness, chest pain/pressure with gait efforts. Vitals stable and WFL throughout distance Appears to be at/near functional baseline; no acute PT needs identified.  Will complete current orders; please reconsult should needs change.        If plan is discharge home, recommend the following:     Can travel by private vehicle        Equipment Recommendations None recommended by PT  Recommendations for Other Services       Functional Status Assessment Patient has not had a recent decline in their functional status     Precautions / Restrictions Precautions Precautions: Fall Restrictions Weight Bearing Restrictions Per Provider Order: No      Mobility  Bed Mobility Overal bed mobility: Modified Independent                  Transfers Overall transfer level: Needs assistance Equipment used: Rolling walker (2 wheels) Transfers: Sit to/from Stand Sit to Stand: Supervision                Ambulation/Gait Ambulation/Gait assistance: Supervision Gait Distance (Feet): 150 Feet Assistive device: None         General Gait Details:  reciprocal stepping pattern with fair step height/length; fair cadence.  Confident gait pattern, feels at/near baseline.  Denies dizziness, lightheadedness, chest pain/pressure with gait efforts.  Vitals stable and WFL throughout distance  Stairs            Wheelchair Mobility     Tilt Bed    Modified Rankin (Stroke Patients Only)       Balance Overall balance assessment: Needs assistance Sitting-balance support: No upper extremity supported, Feet supported Sitting balance-Leahy Scale: Good     Standing balance support: Bilateral upper extremity supported Standing balance-Leahy Scale: Good                               Pertinent Vitals/Pain Pain Assessment Pain Assessment: No/denies pain    Home Living Family/patient expects to be discharged to:: Private residence Living Arrangements: Alone Available Help at Discharge: Available PRN/intermittently;Family Type of Home: Apartment Home Access: Elevator       Home Layout: One level Home Equipment: Rollator (4 wheels);Cane - single point;Tub bench;Grab bars - toilet;Grab bars - tub/shower;Hand held shower head      Prior Function Prior Level of Function : Needs assist             Mobility Comments: Ambulatory with RW for household and limited community distances; + driving; endorses single fall within previous six months       Extremity/Trunk Assessment   Upper Extremity Assessment Upper Extremity Assessment: Overall WFL for tasks assessed    Lower Extremity Assessment Lower Extremity Assessment: Overall [facility]  for tasks assessed (grossly 4/5 throughout)       Communication        Cognition Arousal: Alert Behavior During Therapy: WFL for tasks assessed/performed   PT - Cognitive impairments: No apparent impairments                                 Cueing       General Comments      Exercises     Assessment/Plan    PT Assessment Patient does not need any further  PT services  PT Problem List         PT Treatment Interventions      PT Goals (Current goals can be found in the Care Plan section)  Acute Rehab PT Goals Patient Stated Goal: to return home PT Goal Formulation: All assessment and education complete, DC therapy Time For Goal Achievement: 06/30/23 Potential to Achieve Goals: Good    Frequency       Co-evaluation               AM-PAC PT "6 Clicks" Mobility  Outcome Measure Help needed turning from your back to your side while in a flat bed without using bedrails?: None Help needed moving from lying on your back to sitting on the side of a flat bed without using bedrails?: None Help needed moving to and from a bed to a chair (including a wheelchair)?: None Help needed standing up from a chair using your arms (e.g., wheelchair or bedside chair)?: None Help needed to walk in hospital room?: None Help needed climbing 3-5 steps with a railing? : None 6 Click Score: 24    End of Session Equipment Utilized During Treatment: Gait belt Activity Tolerance: Patient tolerated treatment well Patient left: in bed;with call bell/phone within reach Nurse Communication: Mobility status PT Visit Diagnosis: Difficulty in walking, not elsewhere classified (R26.2)    Time: 6213-0865 PT Time Calculation (min) (ACUTE ONLY): 21 min   Charges:   PT Evaluation $PT Eval Low Complexity: 1 Low   PT General Charges $$ ACUTE PT VISIT: 1 Visit         Oma Alpert H. Manson Passey, PT, DPT, NCS 06/30/23, 4:59 PM [phone]

## 2023-06-30 NOTE — Progress Notes (Addendum)
 Progress Note    Sarah Potts  UJW:[id number] DOB: [birth year]  DOA: 06/28/2023 PCP: Jerl Mina, MD      Brief Narrative:    Medical records reviewed and are as summarized below:  Sarah Potts is a 89 y.o. female with medical history significant of hypertension, atrial fibrillation, CAD s/p coronary stent, chronic systolic heart failure, type II DM, depression, history of anal cancer s/p colostomy, who presented to the hospital because of general weakness, dizziness, double vision and unsteady gait.  She did not have any chest pain or shortness of breath.  She was recently seen in the ED on 06/25/2023 for cough and was given Z-Pak and doxycycline.  Her troponins were elevated: 279, 162, 115.  She was admitted to the hospital for suspected acute NSTEMI and was treated with IV heparin drip.      Assessment/Plan:   Principal Problem:   NSTEMI (non-ST elevated myocardial infarction) (HCC) Active Problems:   Dizziness   HTN (hypertension)   Hyperlipidemia   Stage 3a chronic kidney disease (HCC)   Asthma   Diabetes mellitus (HCC)   Atrial fibrillation (HCC)   HFrEF (heart failure with reduced ejection fraction) (HCC)    Body mass index is 30.36 kg/m.  (Obesity)   Suspected acute NSTEM, elevated troponins (279, 162, 115), CAD s/p coronary stent : Elevated troponins (79, 162, 115).  Of note, her troponin was 65 on 06/28/2023 during her previous ER visit. Continue IV heparin drip for total of 48 hours per cardiologist.  Monitor heparin level per protocol.  She had a nuclear stress test today.  Awaiting report.  2D echo showed EF estimated at 55 to 60%, grade 1 diastolic dysfunction, moderate LVH, mitral valve and aortic valve calcifications without stenosis. Follow-up with cardiologist for further recommendations. She was previously on Xarelto for CAD.   General weakness, dizziness, double vision, unsteady gait: Dizziness and double vision have improved.  She is not  sure about her gait because she has not walked yet.  She lives alone and she wants to make sure she can ambulate without any problems prior to discharge.  Consult PT for further evaluation.   Chronic HFpEF: Euvolemic   Paroxysmal atrial fibrillation: Continue amiodarone.  Xarelto on hold while on IV heparin.   Comorbidities include CKD stage IIIa, hypertension, type II DM, hyperlipidemia, history of asthma   ADDENDUM  Pharmacologic myocardial perfusion stress test did not show any evidence of significant ischemia or scar.  LV systolic function is normal (EF greater than 65%), coronary artery calcifications/stents and aortic atherosclerosis noted. Discontinue IV heparin drip and start home dose of Xarelto. Case discussed with Dr. Okey Dupre, cardiologist. Transfer from progressive care status to MedSurg. Plan to discharge home tomorrow after working with PT     Diet Order             Diet Heart Fluid consistency: Thin  Diet effective now                            Consultants: Cardiologist  Procedures: None    Medications:    amiodarone  200 mg Oral Daily   insulin aspart  0-9 Units Subcutaneous TID WC   levothyroxine  25 mcg Oral Daily   sertraline  50 mg Oral Daily   Continuous Infusions:  heparin 600 Units/hr (06/30/23 1219)     Anti-infectives (From admission, onward)    None  Family Communication/Anticipated D/C date and plan/Code Status   DVT prophylaxis:      Code Status: Limited: Do not attempt resuscitation (DNR) -DNR-LIMITED -Do Not Intubate/DNI   Family Communication: None Disposition Plan: Plan to discharge home   Status is: Inpatient Remains inpatient appropriate because: Workup for suspected NSTEMI       Subjective:   Interval events noted.  No chest pain or shortness of breath.  She said she felt weak, dizzy and unsteady on her feet and also had double vision.  Objective:    Vitals:   06/30/23  0831 06/30/23 0900 06/30/23 1000 06/30/23 1227  BP: (!) 126/49 (!) 114/100 (!) 148/71   Pulse: 63 62 (!) 58   Resp: 18 11 15    Temp:    97.7 F (36.5 C)  TempSrc:    Oral  SpO2: 100% 97% 91%   Height:       No data found.   Intake/Output Summary (Last 24 hours) at 06/30/2023 1249 Last data filed at 06/30/2023 0158 Gross per 24 hour  Intake --  Output 750 ml  Net -750 ml   There were no vitals filed for this visit.  Exam:  GEN: NAD SKIN: Warm and dry EYES: No pallor or icterus ENT: MMM CV: RRR PULM: CTA B ABD: soft, obese, NT, +BS, LLQ colostomy CNS: AAO x 3, non focal EXT: No edema or tenderness        Data Reviewed:   I have personally reviewed following labs and imaging studies:  Labs: Labs show the following:   Basic Metabolic Panel: Recent Labs  Lab 06/24/23 1235 06/24/23 1942 06/28/23 1811  NA 135 135 135  K 4.3 4.5 3.9  CL 101 103 103  CO2 22 19* 18*  GLUCOSE 172* 216* 179*  BUN 19 23 24*  CREATININE 1.16* 1.79* 1.08*  CALCIUM 9.5 9.4 9.3   GFR Estimated Creatinine Clearance: 34.2 mL/min (A) (by C-G formula based on SCr of 1.08 mg/dL (H)). Liver Function Tests: Recent Labs  Lab 06/24/23 1235 06/28/23 1811  AST 86* 107*  ALT 52* 64*  ALKPHOS 72 66  BILITOT 1.2 1.1  PROT 7.6 7.2  ALBUMIN 4.0 3.6   No results for input(s): "LIPASE", "AMYLASE" in the last 168 hours. No results for input(s): "AMMONIA" in the last 168 hours. Coagulation profile Recent Labs  Lab 06/24/23 1942 06/29/23 0125  INR 1.2 1.8*    CBC: Recent Labs  Lab 06/24/23 1235 06/24/23 1942 06/28/23 1811 06/30/23 0148  WBC 5.8 7.0 6.2 4.9  NEUTROABS 4.5  --  4.7  --   HGB 12.7 12.7 12.4 10.6*  HCT 38.2 37.8 37.7 31.9*  MCV 97.7 99.5 100.3* 100.0  PLT 302 321 256 218   Cardiac Enzymes: No results for input(s): "CKTOTAL", "CKMB", "CKMBINDEX", "TROPONINI" in the last 168 hours. BNP (last 3 results) No results for input(s): "PROBNP" in the last 8760  hours. CBG: Recent Labs  Lab 06/29/23 1207 06/29/23 1656 06/30/23 0135 06/30/23 0811 06/30/23 1223  GLUCAP 152* 162* 111* 116* 183*   D-Dimer: No results for input(s): "DDIMER" in the last 72 hours. Hgb A1c: No results for input(s): "HGBA1C" in the last 72 hours. Lipid Profile: No results for input(s): "CHOL", "HDL", "LDLCALC", "TRIG", "CHOLHDL", "LDLDIRECT" in the last 72 hours. Thyroid function studies: No results for input(s): "TSH", "T4TOTAL", "T3FREE", "THYROIDAB" in the last 72 hours.  Invalid input(s): "FREET3" Anemia work up: No results for input(s): "VITAMINB12", "FOLATE", "FERRITIN", "TIBC", "IRON", "RETICCTPCT" in the last  72 hours. Sepsis Labs: Recent Labs  Lab 06/24/23 1235 06/24/23 1942 06/28/23 1811 06/30/23 0148  WBC 5.8 7.0 6.2 4.9    Microbiology No results found for this or any previous visit (from the past 240 hours).  Procedures and diagnostic studies:  US Carotid Bilateral Result Date: 06/29/2023 CLINICAL DATA:  Dizziness EXAM: BILATERAL CAROTID DUPLEX ULTRASOUND TECHNIQUE: Wallace Cullens scale imaging, color Doppler and duplex ultrasound were performed of bilateral carotid and vertebral arteries in the neck. COMPARISON:  None Available. FINDINGS: Criteria: Quantification of carotid stenosis is based on velocity parameters that correlate the residual internal carotid diameter with NASCET-based stenosis levels, using the diameter of the distal internal carotid lumen as the denominator for stenosis measurement. The following velocity measurements were obtained: RIGHT ICA: 75/20 cm/sec CCA: 78/10 cm/sec SYSTOLIC ICA/CCA RATIO:  1.0 ECA: 127 cm/sec LEFT ICA: 91/20 cm/sec CCA: 59/10 cm/sec SYSTOLIC ICA/CCA RATIO:  1.5 ECA: 78 cm/sec RIGHT CAROTID ARTERY: Mild atherosclerotic plaque formation. No hemodynamically significant ICA stenosis, velocity elevation, or turbulent flow. Degree of narrowing less than 50%. RIGHT VERTEBRAL ARTERY:  Antegrade. LEFT CAROTID ARTERY: Mild  atherosclerotic plaque formation. No hemodynamically significant ICA stenosis, velocity elevation, or turbulent flow. Degree of narrowing less than 50%. LEFT VERTEBRAL ARTERY:  Antegrade. IMPRESSION: Carotid atherosclerosis without hemodynamically significant ICA stenosis by ultrasound. Patent antegrade vertebral flow bilaterally. Electronically Signed   By: Minerva Fester M.D.   On: 06/29/2023 19:03   ECHOCARDIOGRAM COMPLETE Result Date: 06/29/2023    ECHOCARDIOGRAM REPORT   Patient Name:   JAMAR Potts Date of Exam: 06/29/2023 Medical Rec #:  [id number]      Height:       62.0 in Accession #:    [id number]     Weight:       166.0 lb Date of Birth:  04-08-1933      BSA:          1.766 m Patient Age:    89 years       BP:           168/75 mmHg Patient Gender: F              HR:           58 bpm. Exam Location:  [facility] Procedure: 2D Echo, Cardiac Doppler, Color Doppler and Strain Analysis (Both            Spectral and Color Flow Doppler were utilized during procedure). Indications:     NSTEMI  History:         Patient has prior history of Echocardiogram examinations, most                  recent 04/22/2022. CHF and Cardiomyopathy, Acute MI and CAD,                  Arrythmias:Atrial Fibrillation,                  Signs/Symptoms:Dizziness/Lightheadedness; Risk                  Factors:Hypertension, Diabetes and Dyslipidemia.  Sonographer:     Mikki Harbor Referring Phys:  [phone] Francoise Schaumann [place] Diagnosing Phys: Lorine Bears MD  Sonographer Comments: Global longitudinal strain was attempted. CKD IMPRESSIONS  1. Left ventricular ejection fraction, by estimation, is 55 to 60%. The left ventricle has normal function. The left ventricle has no regional wall motion abnormalities. There is moderate left ventricular hypertrophy. Left ventricular diastolic parameters are consistent with Grade I  diastolic dysfunction (impaired relaxation). The global longitudinal strain is abnormal.  2. Right ventricular systolic function  is normal. The right ventricular size is normal. There is normal pulmonary artery systolic pressure.  3. Left atrial size was mildly dilated.  4. The mitral valve is abnormal. No evidence of mitral valve regurgitation. No evidence of mitral stenosis. Severe mitral annular calcification.  5. The aortic valve is normal in structure. Aortic valve regurgitation is not visualized. Aortic valve sclerosis/calcification is present, without any evidence of aortic stenosis.  6. The inferior vena cava is normal in size with greater than 50% respiratory variability, suggesting right atrial pressure of 3 mmHg. FINDINGS  Left Ventricle: Left ventricular ejection fraction, by estimation, is 55 to 60%. The left ventricle has normal function. The left ventricle has no regional wall motion abnormalities. Strain was performed and the global longitudinal strain is abnormal. Global longitudinal strain performed but not reported based on interpreter judgement due to suboptimal tracking. The left ventricular internal cavity size was normal in size. There is moderate left ventricular hypertrophy. Left ventricular diastolic parameters are consistent with Grade I diastolic dysfunction (impaired relaxation). Right Ventricle: The right ventricular size is normal. No increase in right ventricular wall thickness. Right ventricular systolic function is normal. There is normal pulmonary artery systolic pressure. The tricuspid regurgitant velocity is 1.98 m/s, and  with an assumed right atrial pressure of 3 mmHg, the estimated right ventricular systolic pressure is 18.7 mmHg. Left Atrium: Left atrial size was mildly dilated. Right Atrium: Right atrial size was normal in size. Pericardium: There is no evidence of pericardial effusion. Mitral Valve: The mitral valve is abnormal. There is moderate thickening of the mitral valve leaflet(s). There is severe calcification of the mitral valve leaflet(s). Severe mitral annular calcification. No evidence of  mitral valve regurgitation. No evidence of mitral valve stenosis. MV peak gradient, 4.0 mmHg. The mean mitral valve gradient is 1.0 mmHg. Tricuspid Valve: The tricuspid valve is normal in structure. Tricuspid valve regurgitation is mild . No evidence of tricuspid stenosis. Aortic Valve: The aortic valve is normal in structure. Aortic valve regurgitation is not visualized. Aortic valve sclerosis/calcification is present, without any evidence of aortic stenosis. Aortic valve mean gradient measures 5.0 mmHg. Aortic valve peak  gradient measures 8.6 mmHg. Aortic valve area, by VTI measures 1.99 cm. Pulmonic Valve: The pulmonic valve was normal in structure. Pulmonic valve regurgitation is mild. No evidence of pulmonic stenosis. Aorta: The aortic root is normal in size and structure. Venous: The inferior vena cava is normal in size with greater than 50% respiratory variability, suggesting right atrial pressure of 3 mmHg. IAS/Shunts: No atrial level shunt detected by color flow Doppler.  LEFT VENTRICLE PLAX 2D LVIDd:         3.80 cm     Diastology LVIDs:         1.90 cm     LV e' medial:    4.03 cm/s LV PW:         1.40 cm     LV E/e' medial:  16.1 LV IVS:        1.50 cm     LV e' lateral:   6.20 cm/s LVOT diam:     1.90 cm     LV E/e' lateral: 10.5 LV SV:         70 LV SV Index:   39 LVOT Area:     2.84 cm  LV Volumes (MOD) LV vol d, MOD A2C: 43.3 ml LV vol  d, MOD A4C: 44.5 ml LV vol s, MOD A2C: 14.3 ml LV vol s, MOD A4C: 17.6 ml LV SV MOD A2C:     29.0 ml LV SV MOD A4C:     44.5 ml LV SV MOD BP:      28.8 ml RIGHT VENTRICLE RV Basal diam:  3.00 cm RV Mid diam:    3.10 cm RV S prime:     10.90 cm/s TAPSE (M-mode): 2.4 cm LEFT ATRIUM             Index        RIGHT ATRIUM           Index LA diam:        4.20 cm 2.38 cm/m   RA Area:     14.60 cm LA Vol (A2C):   40.3 ml 22.82 ml/m  RA Volume:   34.10 ml  19.31 ml/m LA Vol (A4C):   46.5 ml 26.33 ml/m LA Biplane Vol: 44.6 ml 25.25 ml/m  AORTIC VALVE                      PULMONIC VALVE AV Area (Vmax):    1.85 cm      PV Vmax:       0.89 m/s AV Area (Vmean):   1.79 cm      PV Peak grad:  3.2 mmHg AV Area (VTI):     1.99 cm AV Vmax:           147.00 cm/s AV Vmean:          103.000 cm/s AV VTI:            0.351 m AV Peak Grad:      8.6 mmHg AV Mean Grad:      5.0 mmHg LVOT Vmax:         95.80 cm/s LVOT Vmean:        65.000 cm/s LVOT VTI:          0.246 m LVOT/AV VTI ratio: 0.70  AORTA Ao Root diam: 3.30 cm Ao Asc diam:  3.20 cm MITRAL VALVE               TRICUSPID VALVE MV Area (PHT): 1.87 cm    TR Peak grad:   15.7 mmHg MV Area VTI:   2.02 cm    TR Vmax:        198.00 cm/s MV Peak grad:  4.0 mmHg MV Mean grad:  1.0 mmHg    SHUNTS MV Vmax:       1.00 m/s    Systemic VTI:  0.25 m MV Vmean:      49.5 cm/s   Systemic Diam: 1.90 cm MV Decel Time: 405 msec MV E velocity: 65.00 cm/s MV A velocity: 95.40 cm/s MV E/A ratio:  0.68 Lorine Bears MD Electronically signed by Lorine Bears MD Signature Date/Time: 06/29/2023/2:47:18 PM    Final    CT Angio Chest PE W and/or Wo Contrast Result Date: 06/29/2023 CLINICAL DATA:  Pulmonary embolism suspected. History of CHF. Presenting to the ED with dizziness. Double vision. Recent diagnosis of bronchitis. EXAM: CT ANGIOGRAPHY CHEST WITH CONTRAST TECHNIQUE: Multidetector CT imaging of the chest was performed using the standard protocol during bolus administration of intravenous contrast. Multiplanar CT image reconstructions and MIPs were obtained to evaluate the vascular anatomy. RADIATION DOSE REDUCTION: This exam was performed according to the departmental dose-optimization program which includes automated exposure control, adjustment of the mA and/or kV  according to patient size and/or use of iterative reconstruction technique. CONTRAST:  75mL OMNIPAQUE IOHEXOL 350 MG/ML SOLN COMPARISON:  Chest radiograph 06/24/2023 and CT chest 10/04/2021 FINDINGS: Cardiovascular: No pericardial effusion. Negative for acute pulmonary embolism. Coronary  artery and aortic atherosclerotic calcification. Mediastinum/Nodes: Trachea and esophagus are unremarkable. No thoracic adenopathy. Lungs/Pleura: Bibasilar atelectasis/scarring. Mild diffuse bronchial wall thickening. No focal pneumonia, pleural effusion, or pneumothorax. 6 mm nodule in the right lower lobe (3/62) is unchanged since 10/04/2021 compatible with benign etiology. No follow-up is recommended. Upper Abdomen: Cholecystectomy.  No acute abnormality. Musculoskeletal: No acute fracture. Review of the MIP images confirms the above findings. IMPRESSION: 1. Negative for acute pulmonary embolism. 2. Mild diffuse bronchial wall thickening compatible with bronchitis. 3. Aortic Atherosclerosis (ICD10-I70.0). Electronically Signed   By: Minerva Fester M.D.   On: 06/29/2023 00:59   MR BRAIN WO CONTRAST Result Date: 06/28/2023 CLINICAL DATA:  Transient ischemic attack. Dizziness this afternoon. EXAM: MRI HEAD WITHOUT CONTRAST TECHNIQUE: Multiplanar, multiecho pulse sequences of the brain and surrounding structures were obtained without intravenous contrast. COMPARISON:  Head CT earlier same day FINDINGS: Brain: Diffusion imaging does not show any acute or subacute infarction or other cause of restricted diffusion. No focal abnormality affects the brainstem or cerebellum. Cerebral hemispheres show mild age related volume loss and only a few punctate foci of T2 and FLAIR signal in the white matter consistent with minimal small vessel change, less than often seen at this age. No cortical or large vessel territory stroke. No mass, hemorrhage, hydrocephalus or extra-axial collection. Vascular: Major vessels at the base of the brain show flow. Skull and upper cervical spine: Negative Sinuses/Orbits: Clear/normal Other: No fluid in the middle ears or mastoid air cells. IMPRESSION: No acute or reversible finding. Mild age related volume loss. Minimal small vessel change of the cerebral hemispheric white matter, less than  often seen at this age. Electronically Signed   By: Paulina Fusi M.D.   On: 06/28/2023 21:24   CT Head Wo Contrast Result Date: 06/28/2023 CLINICAL DATA:  Central vertigo.  Dizziness. EXAM: CT HEAD WITHOUT CONTRAST TECHNIQUE: Contiguous axial images were obtained from the base of the skull through the vertex without intravenous contrast. RADIATION DOSE REDUCTION: This exam was performed according to the departmental dose-optimization program which includes automated exposure control, adjustment of the mA and/or kV according to patient size and/or use of iterative reconstruction technique. COMPARISON:  03/06/2023 FINDINGS: Brain: Age related volume loss. Minimal small vessel change of the hemispheric white matter. No sign of acute infarction, mass lesion, hemorrhage, hydrocephalus or extra-axial collection. Vascular: There is atherosclerotic calcification of the major vessels at the base of the brain. Skull: Negative Sinuses/Orbits: Clear/normal Other: No fluid in the middle ears or mastoid air cells. IMPRESSION: No acute CT finding. Age related volume loss. Minimal small vessel change of the hemispheric white matter. Electronically Signed   By: Paulina Fusi M.D.   On: 06/28/2023 18:33               LOS: 1 day   Grisel Blumenstock  [facility]   Pager on [email or web address]. If 7PM-7AM, please contact night-coverage at [email or web address]     06/30/2023, 12:49 PM

## 2023-06-30 NOTE — Progress Notes (Signed)
 PHARMACY - ANTICOAGULATION CONSULT NOTE  Pharmacy Consult for Heparin  Indication: chest pain/ACS  Allergies  Allergen Reactions   Levofloxacin Nausea Only    Patient reports that she thinks that she could take this medication.  Patient reports that she thinks that she could take this medication.     Patient reports that she thinks that she could take this medication.   Niacin Other (See Comments)    Hot, Flushed  Pt states she get real red and hot.    Hot, Flushed   Niacin And Related Other (See Comments)    Pt states she get real red and hot.    Patient Measurements: Height: 5\' 2"  (157.5 cm) IBW/kg (Calculated) : 50.1 Heparin Dosing Weight: 66 kg   Vital Signs: Temp: 98.2 F (36.8 C) (03/17 2330) Temp Source: Oral (03/17 2330) BP: 156/63 (03/18 0200) Pulse Rate: 63 (03/18 0200)  Labs: Recent Labs    06/28/23 1811 06/28/23 2322 06/29/23 0125 06/29/23 0125 06/29/23 0147 06/29/23 0841 06/29/23 1210 06/29/23 1700 06/30/23 0148  HGB 12.4  --   --   --   --   --   --   --  10.6*  HCT 37.7  --   --   --   --   --   --   --  31.9*  PLT 256  --   --   --   --   --   --   --  218  APTT  --   --  40*   < >  --  69*  --  120* 112*  LABPROT  --   --  20.7*  --   --   --   --   --   --   INR  --   --  1.8*  --   --   --   --   --   --   HEPARINUNFRC  --   --   --   --  >1.10* >1.10*  --   --   --   CREATININE 1.08*  --   --   --   --   --   --   --   --   TROPONINIHS 65* 279*  --   --   --  162* 115*  --   --    < > = values in this interval not displayed.    Estimated Creatinine Clearance: 34.2 mL/min (A) (by C-G formula based on SCr of 1.08 mg/dL (H)).   Medical History: Past Medical History:  Diagnosis Date   Anal cancer (HCC)    Anemia    Asthma    CHF (congestive heart failure) (HCC)    Diabetes mellitus    Hyperlipidemia    Hypertension     Medications:  (Not in a hospital admission)   Assessment: Pharmacy consulted to dose heparin in this 89  year old female admitted with ACS/NSTEMI. Pt was on Xarelto 15 mg PO daily PTA for Afib. CHA2DS2VASc at least 6 (CHF, HTN, age +20, DM, female). Last dose Sunday 3/16 @ 1000.   CrCl = 34.2 ml/min   Goal of Therapy:  Heparin level 0.3-0.7 units/ml aPTT 66 - 102  seconds Monitor platelets by anticoagulation protocol: Yes  Date Time aPTT/HL Rate/Comment 3/17 0841 69 seconds Therapeutic x 1 3/17 1700 120 seconds Supratherapeutic 3/18 0148 aPTT 112 Supratherapeutic      Plan:  Reduce heparin infusion rate to 600 units/hour. Check aPTT in 8  hours Check heparin level daily Continue to monitor CBC daily while on heparin infusion.  Otelia Sergeant, PharmD, [facility] 06/30/2023 3:04 AM

## 2023-06-30 NOTE — Progress Notes (Signed)
 PHARMACY - ANTICOAGULATION CONSULT NOTE  Pharmacy Consult for rivaroxaban  Indication: atrial fibrillation  Allergies  Allergen Reactions   Levofloxacin Nausea Only    Patient reports that she thinks that she could take this medication.  Patient reports that she thinks that she could take this medication.     Patient reports that she thinks that she could take this medication.   Niacin Other (See Comments)    Hot, Flushed  Pt states she get real red and hot.    Hot, Flushed   Niacin And Related Other (See Comments)    Pt states she get real red and hot.   Patient Measurements: Height: 5\' 2"  (157.5 cm) IBW/kg (Calculated) : 50.1 Heparin Dosing Weight: 66 kg   Vital Signs: Temp: 97.7 F (36.5 C) (03/18 1227) Temp Source: Oral (03/18 1227) BP: 122/51 (03/18 1500) Pulse Rate: 64 (03/18 1500)  Labs: Recent Labs    06/28/23 1811 06/28/23 2322 06/29/23 0125 06/29/23 0125 06/29/23 0147 06/29/23 0841 06/29/23 1210 06/29/23 1700 06/30/23 0148 06/30/23 1226  HGB 12.4  --   --   --   --   --   --   --  10.6*  --   HCT 37.7  --   --   --   --   --   --   --  31.9*  --   PLT 256  --   --   --   --   --   --   --  218  --   APTT  --   --  40*   < >  --  69*  --  120* 112* 73*  LABPROT  --   --  20.7*  --   --   --   --   --   --   --   INR  --   --  1.8*  --   --   --   --   --   --   --   HEPARINUNFRC  --   --   --   --  >1.10* >1.10*  --   --   --  0.37  CREATININE 1.08*  --   --   --   --   --   --   --   --   --   TROPONINIHS 65* 279*  --   --   --  162* 115*  --   --   --    < > = values in this interval not displayed.    Estimated Creatinine Clearance: 34.2 mL/min (A) (by C-G formula based on SCr of 1.08 mg/dL (H)).  Medical History: Past Medical History:  Diagnosis Date   Anal cancer (HCC)    Anemia    Asthma    CHF (congestive heart failure) (HCC)    Diabetes mellitus    Hyperlipidemia    Hypertension    Medications:  (Not in a hospital  admission)  Assessment: Pharmacy consulted to dose heparin in this 89 year old female admitted with ACS/NSTEMI. Pt was on Xarelto 15 mg PO daily PTA for Afib. CHA2DS2VASc at least 6 (CHF, HTN, age +29, DM, female). Last dose Sunday 3/16 @ 1000.   CrCl = 34.2 ml/min   Goal of Therapy:  Heparin level 0.3-0.7 units/ml aPTT 66 - 102  seconds Monitor platelets by anticoagulation protocol: Yes  Date Time aPTT/HL Rate/Comment 3/17 0841 69/--  Therapeutic x 1 3/17 1700 120/--  Supratherapeutic 3/18 0148  112/--  Supratherapeutic 3/18 1226 73/0.37 Therapeutic x 1       Plan:  Nuclear stress test on 3/18 showed normal myocardial perfusion without evidence of significant ischemia Stop heparin infusion Resume rivaroxaban 15 mg po daily Monitor CBC at least weekly  Thank you for involving pharmacy in this patient's care.   Rockwell Alexandria, PharmD Clinical Pharmacist 06/30/2023 4:51 PM

## 2023-06-30 NOTE — ED Notes (Signed)
 Pt to nuclear medicine.

## 2023-07-01 ENCOUNTER — Other Ambulatory Visit: Payer: Self-pay | Admitting: Cardiovascular Disease

## 2023-07-01 DIAGNOSIS — I214 Non-ST elevation (NSTEMI) myocardial infarction: Secondary | ICD-10-CM | POA: Diagnosis not present

## 2023-07-01 LAB — CBC
HCT: 35.3 % — ABNORMAL LOW (ref 36.0–46.0)
Hemoglobin: 12 g/dL (ref 12.0–15.0)
MCH: 33.4 pg (ref 26.0–34.0)
MCHC: 34 g/dL (ref 30.0–36.0)
MCV: 98.3 fL (ref 80.0–100.0)
Platelets: 239 10*3/uL (ref 150–400)
RBC: 3.59 MIL/uL — ABNORMAL LOW (ref 3.87–5.11)
RDW: 14.4 % (ref 11.5–15.5)
WBC: 5 10*3/uL (ref 4.0–10.5)
nRBC: 0 % (ref 0.0–0.2)

## 2023-07-01 LAB — GLUCOSE, CAPILLARY
Glucose-Capillary: 132 mg/dL — ABNORMAL HIGH (ref 70–99)
Glucose-Capillary: 209 mg/dL — ABNORMAL HIGH (ref 70–99)

## 2023-07-01 NOTE — Discharge Summary (Signed)
 Physician Discharge Summary   Patient: Sarah Potts MRN: [id number] DOB: [birth year]  Admit date:     06/28/2023  Discharge date: 07/01/2023  Discharge Physician: Pennie Banter   PCP: Jerl Mina, MD   Recommendations at discharge:  {Tip this will not be part of the note when signed- Example include specific recommendations for outpatient follow-up, pending tests to follow-up on. (Optional):26781}  Follow up with Cardiology Follow up with Primary Care Repeat CBC, BMP, Mg at follow up Follow up on orthostatic dizziness (this improved with stopping metoprolol during admission).  Discharge Diagnoses: Principal Problem:   NSTEMI (non-ST elevated myocardial infarction) (HCC) Active Problems:   Dizziness   HTN (hypertension)   Hyperlipidemia   Stage 3a chronic kidney disease (HCC)   Asthma   Diabetes mellitus (HCC)   Atrial fibrillation (HCC)   HFrEF (heart failure with reduced ejection fraction) (HCC)  Resolved Problems:   * No resolved hospital problems. *  Hospital Course: No notes on file  Assessment and Plan: * NSTEMI (non-ST elevated myocardial infarction) (HCC) Baseline mild to moderate nonobstructive coronary artery disease with stable angina  Trop 65-->279 in setting of dizziness, double vision and nausea with concern for NSTEMI with baseline CAD  CTA negative for PE  S/p ASA  Started on heparin gtt in the ER  Will continue to trend troponin  2D ECHO  Consult cardiology  Follow up recommendations    Dizziness Transient episode of dizziness, diplopia and presyncopal symptoms in setting of active NSTEMI  CT head and MRI brain WNL  Suspect potential cardiac etiology  Orthostatics pending  2D ECHO ordered  Grossly nonfocal neuro exam  Will otherwise monitor for now  Fall precautions  PT/OT evaluation  Follow closely   HTN (hypertension) BP stable  Titrate home regimen    Hyperlipidemia Statin    Stage 3a chronic kidney disease (HCC) Cr 1.08  GFR in the 40s  Near baseline  Monitor    Asthma Stable from a respiratory standpoint Cont home inhalers    HFrEF (heart failure with reduced ejection fraction) (HCC) 2D ECHO 12/2021 w/ EF 40-45%  Euvolemic  Weight today 75.3kg  Monitor volume status    Atrial fibrillation (HCC) Rate controlled at present  On heparin gtt in setting of NSTEMI  Cont home meds including amiodarone   Diabetes mellitus (HCC) Blood sugar 170s  SSI  A1C        {Tip this will not be part of the note when signed Body mass index is 30.36 kg/m. , ,  (Optional):26781}  {(NOTE) Pain control PDMP Statment (Optional):26782} Consultants: *** Procedures performed: ***  Disposition: {Plan; Disposition:26390} Diet recommendation:  {Diet_Plan:26776} DISCHARGE MEDICATION: Allergies as of 07/01/2023       Reactions   Levofloxacin Nausea Only   Patient reports that she thinks that she could take this medication. Patient reports that she thinks that she could take this medication.     Patient reports that she thinks that she could take this medication.   Niacin Other (See Comments)   Hot, Flushed Pt states she get real red and hot.    Hot, Flushed   Niacin And Related Other (See Comments)   Pt states she get real red and hot.     Med Rec must be completed prior to using this [facility]***       Follow-up Information     Schedule an appointment as soon as possible for a visit  with Jerl Mina, MD.   Specialty: [facility]  Medicine Contact information: [address] [place] [address] [phone]         [facility] Emergency Department at [facility].   Specialty: Emergency Medicine Why: If symptoms worsen Contact information: [address] [place] [address] [phone]               Discharge Exam: There were no vitals filed for this visit. ***  Condition at discharge: {DC Condition:26389}  The results of  significant diagnostics from this hospitalization (including imaging, microbiology, ancillary and laboratory) are listed below for reference.   Imaging Studies: NM Myocar Multi W/Spect W/Wall Motion / EF Result Date: 06/30/2023   Normal pharmacologic myocardial perfusion stress test without evidence of significant ischemia or scar.   Left ventricular systolic function is normal (LVEF > 65%).   Coronary artery calcification/stent(s) and aortic atherosclerosis are noted on the attenuation correction CT.   This is a low-risk study.   US Carotid Bilateral Result Date: 06/29/2023 CLINICAL DATA:  Dizziness EXAM: BILATERAL CAROTID DUPLEX ULTRASOUND TECHNIQUE: Wallace Cullens scale imaging, color Doppler and duplex ultrasound were performed of bilateral carotid and vertebral arteries in the neck. COMPARISON:  None Available. FINDINGS: Criteria: Quantification of carotid stenosis is based on velocity parameters that correlate the residual internal carotid diameter with NASCET-based stenosis levels, using the diameter of the distal internal carotid lumen as the denominator for stenosis measurement. The following velocity measurements were obtained: RIGHT ICA: 75/20 cm/sec CCA: 78/10 cm/sec SYSTOLIC ICA/CCA RATIO:  1.0 ECA: 127 cm/sec LEFT ICA: 91/20 cm/sec CCA: 59/10 cm/sec SYSTOLIC ICA/CCA RATIO:  1.5 ECA: 78 cm/sec RIGHT CAROTID ARTERY: Mild atherosclerotic plaque formation. No hemodynamically significant ICA stenosis, velocity elevation, or turbulent flow. Degree of narrowing less than 50%. RIGHT VERTEBRAL ARTERY:  Antegrade. LEFT CAROTID ARTERY: Mild atherosclerotic plaque formation. No hemodynamically significant ICA stenosis, velocity elevation, or turbulent flow. Degree of narrowing less than 50%. LEFT VERTEBRAL ARTERY:  Antegrade. IMPRESSION: Carotid atherosclerosis without hemodynamically significant ICA stenosis by ultrasound. Patent antegrade vertebral flow bilaterally. Electronically Signed   By: Minerva Fester M.D.    On: 06/29/2023 19:03   ECHOCARDIOGRAM COMPLETE Result Date: 06/29/2023    ECHOCARDIOGRAM REPORT   Patient Name:   Sarah Potts Date of Exam: 06/29/2023 Medical Rec #:  [id number]      Height:       62.0 in Accession #:    [id number]     Weight:       166.0 lb Date of Birth:  1932/06/06      BSA:          1.766 m Patient Age:    89 years       BP:           168/75 mmHg Patient Gender: F              HR:           58 bpm. Exam Location:  [facility] Procedure: 2D Echo, Cardiac Doppler, Color Doppler and Strain Analysis (Both            Spectral and Color Flow Doppler were utilized during procedure). Indications:     NSTEMI  History:         Patient has prior history of Echocardiogram examinations, most                  recent 04/22/2022. CHF and Cardiomyopathy, Acute MI and CAD,  Arrythmias:Atrial Fibrillation,                  Signs/Symptoms:Dizziness/Lightheadedness; Risk                  Factors:Hypertension, Diabetes and Dyslipidemia.  Sonographer:     Mikki Harbor Referring Phys:  [phone] Francoise Schaumann [place]ON Diagnosing Phys: Lorine Bears MD  Sonographer Comments: Global longitudinal strain was attempted. CKD IMPRESSIONS  1. Left ventricular ejection fraction, by estimation, is 55 to 60%. The left ventricle has normal function. The left ventricle has no regional wall motion abnormalities. There is moderate left ventricular hypertrophy. Left ventricular diastolic parameters are consistent with Grade I diastolic dysfunction (impaired relaxation). The global longitudinal strain is abnormal.  2. Right ventricular systolic function is normal. The right ventricular size is normal. There is normal pulmonary artery systolic pressure.  3. Left atrial size was mildly dilated.  4. The mitral valve is abnormal. No evidence of mitral valve regurgitation. No evidence of mitral stenosis. Severe mitral annular calcification.  5. The aortic valve is normal in structure. Aortic valve regurgitation is not visualized.  Aortic valve sclerosis/calcification is present, without any evidence of aortic stenosis.  6. The inferior vena cava is normal in size with greater than 50% respiratory variability, suggesting right atrial pressure of 3 mmHg. FINDINGS  Left Ventricle: Left ventricular ejection fraction, by estimation, is 55 to 60%. The left ventricle has normal function. The left ventricle has no regional wall motion abnormalities. Strain was performed and the global longitudinal strain is abnormal. Global longitudinal strain performed but not reported based on interpreter judgement due to suboptimal tracking. The left ventricular internal cavity size was normal in size. There is moderate left ventricular hypertrophy. Left ventricular diastolic parameters are consistent with Grade I diastolic dysfunction (impaired relaxation). Right Ventricle: The right ventricular size is normal. No increase in right ventricular wall thickness. Right ventricular systolic function is normal. There is normal pulmonary artery systolic pressure. The tricuspid regurgitant velocity is 1.98 m/s, and  with an assumed right atrial pressure of 3 mmHg, the estimated right ventricular systolic pressure is 18.7 mmHg. Left Atrium: Left atrial size was mildly dilated. Right Atrium: Right atrial size was normal in size. Pericardium: There is no evidence of pericardial effusion. Mitral Valve: The mitral valve is abnormal. There is moderate thickening of the mitral valve leaflet(s). There is severe calcification of the mitral valve leaflet(s). Severe mitral annular calcification. No evidence of mitral valve regurgitation. No evidence of mitral valve stenosis. MV peak gradient, 4.0 mmHg. The mean mitral valve gradient is 1.0 mmHg. Tricuspid Valve: The tricuspid valve is normal in structure. Tricuspid valve regurgitation is mild . No evidence of tricuspid stenosis. Aortic Valve: The aortic valve is normal in structure. Aortic valve regurgitation is not visualized.  Aortic valve sclerosis/calcification is present, without any evidence of aortic stenosis. Aortic valve mean gradient measures 5.0 mmHg. Aortic valve peak  gradient measures 8.6 mmHg. Aortic valve area, by VTI measures 1.99 cm. Pulmonic Valve: The pulmonic valve was normal in structure. Pulmonic valve regurgitation is mild. No evidence of pulmonic stenosis. Aorta: The aortic root is normal in size and structure. Venous: The inferior vena cava is normal in size with greater than 50% respiratory variability, suggesting right atrial pressure of 3 mmHg. IAS/Shunts: No atrial level shunt detected by color flow Doppler.  LEFT VENTRICLE PLAX 2D LVIDd:         3.80 cm     Diastology LVIDs:  1.90 cm     LV e' medial:    4.03 cm/s LV PW:         1.40 cm     LV E/e' medial:  16.1 LV IVS:        1.50 cm     LV e' lateral:   6.20 cm/s LVOT diam:     1.90 cm     LV E/e' lateral: 10.5 LV SV:         70 LV SV Index:   39 LVOT Area:     2.84 cm  LV Volumes (MOD) LV vol d, MOD A2C: 43.3 ml LV vol d, MOD A4C: 44.5 ml LV vol s, MOD A2C: 14.3 ml LV vol s, MOD A4C: 17.6 ml LV SV MOD A2C:     29.0 ml LV SV MOD A4C:     44.5 ml LV SV MOD BP:      28.8 ml RIGHT VENTRICLE RV Basal diam:  3.00 cm RV Mid diam:    3.10 cm RV S prime:     10.90 cm/s TAPSE (M-mode): 2.4 cm LEFT ATRIUM             Index        RIGHT ATRIUM           Index LA diam:        4.20 cm 2.38 cm/m   RA Area:     14.60 cm LA Vol (A2C):   40.3 ml 22.82 ml/m  RA Volume:   34.10 ml  19.31 ml/m LA Vol (A4C):   46.5 ml 26.33 ml/m LA Biplane Vol: 44.6 ml 25.25 ml/m  AORTIC VALVE                     PULMONIC VALVE AV Area (Vmax):    1.85 cm      PV Vmax:       0.89 m/s AV Area (Vmean):   1.79 cm      PV Peak grad:  3.2 mmHg AV Area (VTI):     1.99 cm AV Vmax:           147.00 cm/s AV Vmean:          103.000 cm/s AV VTI:            0.351 m AV Peak Grad:      8.6 mmHg AV Mean Grad:      5.0 mmHg LVOT Vmax:         95.80 cm/s LVOT Vmean:        65.000 cm/s LVOT VTI:           0.246 m LVOT/AV VTI ratio: 0.70  AORTA Ao Root diam: 3.30 cm Ao Asc diam:  3.20 cm MITRAL VALVE               TRICUSPID VALVE MV Area (PHT): 1.87 cm    TR Peak grad:   15.7 mmHg MV Area VTI:   2.02 cm    TR Vmax:        198.00 cm/s MV Peak grad:  4.0 mmHg MV Mean grad:  1.0 mmHg    SHUNTS MV Vmax:       1.00 m/s    Systemic VTI:  0.25 m MV Vmean:      49.5 cm/s   Systemic Diam: 1.90 cm MV Decel Time: 405 msec MV E velocity: 65.00 cm/s MV A velocity: 95.40 cm/s MV E/A ratio:  0.68 Lorine Bears MD  Electronically signed by Lorine Bears MD Signature Date/Time: 06/29/2023/2:47:18 PM    Final    CT Angio Chest PE W and/or Wo Contrast Result Date: 06/29/2023 CLINICAL DATA:  Pulmonary embolism suspected. History of CHF. Presenting to the ED with dizziness. Double vision. Recent diagnosis of bronchitis. EXAM: CT ANGIOGRAPHY CHEST WITH CONTRAST TECHNIQUE: Multidetector CT imaging of the chest was performed using the standard protocol during bolus administration of intravenous contrast. Multiplanar CT image reconstructions and MIPs were obtained to evaluate the vascular anatomy. RADIATION DOSE REDUCTION: This exam was performed according to the departmental dose-optimization program which includes automated exposure control, adjustment of the mA and/or kV according to patient size and/or use of iterative reconstruction technique. CONTRAST:  75mL OMNIPAQUE IOHEXOL 350 MG/ML SOLN COMPARISON:  Chest radiograph 06/24/2023 and CT chest 10/04/2021 FINDINGS: Cardiovascular: No pericardial effusion. Negative for acute pulmonary embolism. Coronary artery and aortic atherosclerotic calcification. Mediastinum/Nodes: Trachea and esophagus are unremarkable. No thoracic adenopathy. Lungs/Pleura: Bibasilar atelectasis/scarring. Mild diffuse bronchial wall thickening. No focal pneumonia, pleural effusion, or pneumothorax. 6 mm nodule in the right lower lobe (3/62) is unchanged since 10/04/2021 compatible with benign etiology.  No follow-up is recommended. Upper Abdomen: Cholecystectomy.  No acute abnormality. Musculoskeletal: No acute fracture. Review of the MIP images confirms the above findings. IMPRESSION: 1. Negative for acute pulmonary embolism. 2. Mild diffuse bronchial wall thickening compatible with bronchitis. 3. Aortic Atherosclerosis (ICD10-I70.0). Electronically Signed   By: Minerva Fester M.D.   On: 06/29/2023 00:59   MR BRAIN WO CONTRAST Result Date: 06/28/2023 CLINICAL DATA:  Transient ischemic attack. Dizziness this afternoon. EXAM: MRI HEAD WITHOUT CONTRAST TECHNIQUE: Multiplanar, multiecho pulse sequences of the brain and surrounding structures were obtained without intravenous contrast. COMPARISON:  Head CT earlier same day FINDINGS: Brain: Diffusion imaging does not show any acute or subacute infarction or other cause of restricted diffusion. No focal abnormality affects the brainstem or cerebellum. Cerebral hemispheres show mild age related volume loss and only a few punctate foci of T2 and FLAIR signal in the white matter consistent with minimal small vessel change, less than often seen at this age. No cortical or large vessel territory stroke. No mass, hemorrhage, hydrocephalus or extra-axial collection. Vascular: Major vessels at the base of the brain show flow. Skull and upper cervical spine: Negative Sinuses/Orbits: Clear/normal Other: No fluid in the middle ears or mastoid air cells. IMPRESSION: No acute or reversible finding. Mild age related volume loss. Minimal small vessel change of the cerebral hemispheric white matter, less than often seen at this age. Electronically Signed   By: Paulina Fusi M.D.   On: 06/28/2023 21:24   CT Head Wo Contrast Result Date: 06/28/2023 CLINICAL DATA:  Central vertigo.  Dizziness. EXAM: CT HEAD WITHOUT CONTRAST TECHNIQUE: Contiguous axial images were obtained from the base of the skull through the vertex without intravenous contrast. RADIATION DOSE REDUCTION: This exam  was performed according to the departmental dose-optimization program which includes automated exposure control, adjustment of the mA and/or kV according to patient size and/or use of iterative reconstruction technique. COMPARISON:  03/06/2023 FINDINGS: Brain: Age related volume loss. Minimal small vessel change of the hemispheric white matter. No sign of acute infarction, mass lesion, hemorrhage, hydrocephalus or extra-axial collection. Vascular: There is atherosclerotic calcification of the major vessels at the base of the brain. Skull: Negative Sinuses/Orbits: Clear/normal Other: No fluid in the middle ears or mastoid air cells. IMPRESSION: No acute CT finding. Age related volume loss. Minimal small vessel change of the hemispheric white matter.  Electronically Signed   By: Paulina Fusi M.D.   On: 06/28/2023 18:33   DG Chest 2 View Result Date: 06/24/2023 CLINICAL DATA:  Weakness. EXAM: CHEST - 2 VIEW COMPARISON:  05/01/2023 FINDINGS: The cardiomediastinal contours are normal. Aortic atherosclerosis. The lungs are clear. Pulmonary vasculature is normal. No consolidation, pleural effusion, or pneumothorax. No acute osseous abnormalities are seen. IMPRESSION: No acute findings.  No change from prior. Electronically Signed   By: Narda Rutherford M.D.   On: 06/24/2023 21:54    Microbiology: Results for orders placed or performed during the hospital encounter of 12/20/21  Urine Culture     Status: None   Collection Time: 12/20/21  5:37 PM   Specimen: Urine, Suprapubic  Result Value Ref Range Status   Specimen Description   Final    URINE, SUPRAPUBIC Performed at [facility], [address], [place], [place] [address]    Special Requests   Final    NONE Performed at [facility], [address], [place], [place] [address]    Culture   Final    NO GROWTH Performed at [facility], [address], [place], [place] [address]    Report Status 12/22/2021 FINAL  Final     Labs: CBC: Recent Labs  Lab 06/24/23 1235 06/24/23 1942 06/28/23 1811 06/30/23 0148 07/01/23 0442  WBC 5.8 7.0 6.2 4.9 5.0  NEUTROABS 4.5  --  4.7  --   --   HGB 12.7 12.7 12.4 10.6* 12.0  HCT 38.2 37.8 37.7 31.9* 35.3*  MCV 97.7 99.5 100.3* 100.0 98.3  PLT 302 321 256 218 239   Basic Metabolic Panel: Recent Labs  Lab 06/24/23 1235 06/24/23 1942 06/28/23 1811  NA 135 135 135  K 4.3 4.5 3.9  CL 101 103 103  CO2 22 19* 18*  GLUCOSE 172* 216* 179*  BUN 19 23 24*  CREATININE 1.16* 1.79* 1.08*  CALCIUM 9.5 9.4 9.3   Liver Function Tests: Recent Labs  Lab 06/24/23 1235 06/28/23 1811  AST 86* 107*  ALT 52* 64*  ALKPHOS 72 66  BILITOT 1.2 1.1  PROT 7.6 7.2  ALBUMIN 4.0 3.6   CBG: Recent Labs  Lab 06/30/23 0811 06/30/23 1223 06/30/23 1732 06/30/23 2145 07/01/23 0818  GLUCAP 116* 183* 126* 160* 132*    Discharge time spent: {LESS THAN/GREATER BJYN:82956} 30 minutes.  Signed: Pennie Banter, DO [facility] 07/01/2023

## 2023-07-01 NOTE — Plan of Care (Signed)

## 2023-07-09 NOTE — Progress Notes (Unsigned)
 Shortness of cardiology Office Note  Date:  07/10/2023   ID:  Sarah Potts, Sarah Potts 27-Jul-1932, MRN [id number]  PCP:  Jerl Mina, MD   Chief Complaint  Patient presents with   [facility] follor up/6 month     Patient c/o dizziness, shortness of breath with over exertion and twinges in chest.     HPI:  [name] is a very pleasant 89 year old woman with a history of  coronary artery disease diagnosed by cardiac catheterization in 2006,  PCI placed December 2019 in [place] to the left circumflex diabetes,  hypertension,  hyperlipidemia  Anal cancer Atrial fibrillation 2016 in [place] In hospital 9/23, chest pain, cath with mild to moderate nonobstructive disease, medical management recommended January 2024 ejection fraction 60% Atrial fibrillation August 2023 who presents for follow-up of her CAD, PCI in 2019, atrial fibrillation  On today's visit Long discussion concerning recent events Frequent trips to the emergency room, urgent care, several hospital admissions Hospital records requested and reviewed 11/24: fall Acute nondisplaced avulsion of right tibial tuberosity,  Went to rehab Discharged on metoprolol succinate 50 Presumably losartan had been held for renal dysfunction elevated creatinine  ER 05/01/23: chest pain chest pain that radiated down her left arm associated with some nausea.  D/ced  ERb 06/25/23: abnormal lab TNT 22, cough sx  ER 06/28/23 Dizziness, seeing double, vomiting Vertigo, dehydration, acute renal failure All BP meds stopped including metoprolol losartan amlodipine CR 1.7 from dehydration, at the time of discharge   In follow-up today she reports Pressure running high when she checks it first thing in the morning, 150 to 170 systolic  She reports periodic episodes of emesis Poor balance  Other hospital visits reviewed  ED on 06/25/2023 for cough and was given Z-Pak and doxycycline.  emergency room December 30, 2018 for mechanical fall at home,  lost her balance Chest wall contusion Seen by urgent care: For leg swelling Was given Lasix 20 mg 3 days  EKG personally reviewed by myself on todays visit EKG Interpretation Date/Time:  Friday July 10 2023 09:03:45 EDT Ventricular Rate:  71 PR Interval:  194 QRS Duration:  102 QT Interval:  434 QTC Calculation: 471 R Axis:   -11  Text Interpretation: Normal sinus rhythm Normal ECG When compared with ECG of 28-Jun-2023 18:07, Nonspecific T wave abnormality, improved in Lateral leads Confirmed by Julien Nordmann [phone]) on 07/10/2023 9:17:17 AM   Other past medical history reviewed In hospital 9/23, chest pain No clear culprit is identified for elevated troponin. Cardiac cath:   Ost RCA lesion is 30% stenosed.   Prox RCA lesion is 40% stenosed.   Dist RCA lesion is 20% stenosed.   Mid Cx to Dist Cx lesion is 10% stenosed.   Prox Cx lesion is 40% stenosed.   Prox LAD to Mid LAD lesion is 50% stenosed.   Ost LAD to Prox LAD lesion is 40% stenosed.   1.  Mild to moderate nonobstructive coronary artery disease.  Patent left circumflex stent with no significant restenosis.  Moderately to severely calcified coronary arteries. 2.  Left ventricular angiography was not performed due to chronic kidney disease.  EF was mildly reduced by echo.  Mildly elevated left ventricular end-diastolic pressure.   Echo 1/24 EF 60-65%, previous study was 40-45% in atrial fibrillation  03/25/18 had chest pain while living in [place] N/V on arrival, suspected non-STEMI, taken to the catheterization lab had stent placed, [employer]. Synergy 2.5 x 16 mm to LCX  While living  in [place], timing unclear, reports developing tachypalpitations, seen in the hospital Diagnosed with atrial fibrillation, started on Eliquis and metoprolol Reports she is now taking Xarelto   PMH:   has a past medical history of Anal cancer (HCC), Anemia, Asthma, CHF (congestive heart failure) (HCC), Diabetes mellitus,  Hyperlipidemia, and Hypertension.  PSH:    Past Surgical History:  Procedure Laterality Date   BLADDER SURGERY     CARDIAC CATHETERIZATION     COLOSTOMY     CORONARY ANGIOPLASTY WITH STENT PLACEMENT  03/25/2018   Stent to the LCX  2.50 mm x 16mm  [employer] Synergy REF [id number]   heart stent     LEFT HEART CATH AND CORONARY ANGIOGRAPHY N/A 12/23/2021   Procedure: LEFT HEART CATH AND CORONARY ANGIOGRAPHY;  Surgeon: Iran Ouch, MD;  Location: [facility] INVASIVE CV LAB;  Service: Cardiovascular;  Laterality: N/A;   TONSILLECTOMY     uterus surgery     removed    Current Outpatient Medications  Medication Sig Dispense Refill   acetaminophen (TYLENOL) 500 MG tablet Take 500 mg by mouth every 4 (four) hours as needed.     albuterol (VENTOLIN HFA) 108 (90 Base) MCG/ACT inhaler Inhale 1 puff into the lungs every 4 (four) hours as needed.     amiodarone (PACERONE) 200 MG tablet Take 1 tablet (200 mg total) by mouth daily. 90 tablet 3   atorvastatin (LIPITOR) 40 MG tablet daily.     Cholecalciferol 25 MCG (1000 UT) tablet Take 4,000 Units by mouth daily. Pt takes 3 daily     fexofenadine (ALLEGRA) 60 MG tablet Take 60 mg by mouth daily.     levothyroxine (SYNTHROID) 25 MCG tablet Take 25 mcg by mouth daily.     meclizine (ANTIVERT) 25 MG tablet Take 25 mg by mouth 2 (two) times daily as needed.     metFORMIN (GLUCOPHAGE) 1000 MG tablet Take 500 mg by mouth 2 (two) times daily with a meal.     montelukast (SINGULAIR) 10 MG tablet Take 10 mg by mouth at bedtime.       nitroGLYCERIN (NITROSTAT) 0.4 MG SL tablet Place 1 tablet (0.4 mg total) under the tongue every 5 (five) minutes as needed for chest pain. 20 tablet 12   Omega-3 Fatty Acids (FISH OIL) 1000 MG CAPS Take 1,000 mg by mouth in the morning and at bedtime.     omeprazole (PRILOSEC) 40 MG capsule Take 40 mg by mouth daily.       pioglitazone (ACTOS) 30 MG tablet Take 30 mg by mouth daily.     sertraline  (ZOLOFT) 50 MG tablet Take 50 mg by mouth daily.     TRULICITY 1.5 MG/0.5ML SOPN Inject into the skin once a week.     XARELTO 15 MG TABS tablet TAKE 1 TABLET (15 MG TOTAL) BY MOUTH DAILY. 90 tablet 1   No current facility-administered medications for this visit.    Allergies:   Levofloxacin, Niacin, and Niacin and related   Social History:  The patient  reports that she has never smoked. She has never used smokeless tobacco. She reports that she does not drink alcohol and does not use drugs.   Family History:   family history includes Breast cancer in her mother; Cancer in her mother and sister; Cervical cancer in her sister; Heart attack in her father; Heart disease in her brother, father, and sister.    Review of Systems: Review of Systems  Constitutional: Negative.  HENT: Negative.    Respiratory: Negative.    Cardiovascular: Negative.   Gastrointestinal: Negative.   Musculoskeletal: Negative.   Neurological: Negative.   Psychiatric/Behavioral: Negative.    All other systems reviewed and are negative.   PHYSICAL EXAM: VS:  BP (!) 140/60 (BP Location: Left Arm, Patient Position: Sitting, Cuff Size: Normal)   Pulse 71   Ht 5\' 2"  (1.575 m)   Wt 168 lb (76.2 kg)   SpO2 98%   BMI 30.73 kg/m  , BMI Body mass index is 30.73 kg/m. Constitutional:  oriented to person, place, and time. No distress.  HENT:  Head: Grossly normal Eyes:  no discharge. No scleral icterus.  Neck: No JVD, no carotid bruits  Cardiovascular: Regular rate and rhythm, no murmurs appreciated Pulmonary/Chest: Clear to auscultation bilaterally, no wheezes or rails Abdominal: Soft.  no distension.  no tenderness.  Musculoskeletal: Normal range of motion Neurological:  normal muscle tone. Coordination normal. No atrophy Skin: Skin warm and dry Psychiatric: normal affect, pleasant   Recent Labs: 03/11/2023: Magnesium 1.9 06/16/2023: TSH 4.690 06/24/2023: B Natriuretic Peptide 102.7 06/28/2023: ALT 64; BUN  24; Creatinine, Ser 1.08; Potassium 3.9; Sodium 135 07/01/2023: Hemoglobin 12.0; Platelets 239    Lipid Panel Lab Results  Component Value Date   CHOL 123 12/21/2021   HDL 42 12/21/2021   LDLCALC 46 12/21/2021   TRIG 177 (H) 12/21/2021    Wt Readings from Last 3 Encounters:  07/10/23 168 lb (76.2 kg)  06/24/23 166 lb (75.3 kg)  05/08/23 174 lb 9.6 oz (79.2 kg)     ASSESSMENT AND PLAN:  Problem List Items Addressed This Visit       Cardiology Problems   HTN (hypertension)   Relevant Orders   EKG 12-Lead (Completed)   Hyperlipidemia   Atrial fibrillation (HCC)   Relevant Orders   EKG 12-Lead (Completed)   CAD (coronary artery disease) - Primary   Relevant Orders   EKG 12-Lead (Completed)   Other Visit Diagnoses       Chronic systolic heart failure (HCC)       Relevant Orders   EKG 12-Lead (Completed)     Hyperlipidemia LDL goal <70         Nonischemic cardiomyopathy (HCC)       Relevant Orders   EKG 12-Lead (Completed)       Coronary disease with stable angina Stent placed 2019 left circumflex [employer] Synergy stent catheterization September 2023 for chest pain showing nonobstructive disease Recommend she increase Lipitor up to 40 mg daily  Xarelto in place of aspirin, on beta-blocker, statin Zetia presumably held given periodic emesis  Paroxysmal atrial fibrillation Maintaining normal sinus rhythm Xarelto 15 mg daily Restart metoprolol succinate at reduced dose 25 daily Thyroid managed by primary care, stable  Essential hypertension Blood pressure running high at home 150 up to 180 systolic Not on Lasix given frequent episodes of emesis and dehydration Will start losartan 50 daily, metoprolol succinate 25 daily For now we will try to avoid amlodipine if possible given prior history of leg swelling  Hyperlipidemia Lipitor 40 daily Not on Zetia  Chronic renal sufficiency Waxing waning renal function in the setting of hypovolemia  complicated by emesis episodes    Signed, Dossie Arbour, M.D., Ph.D. [facility] [place], [place] [phone]

## 2023-07-10 ENCOUNTER — Ambulatory Visit: Payer: Medicare HMO | Attending: Cardiovascular Disease | Admitting: Cardiovascular Disease

## 2023-07-10 ENCOUNTER — Encounter: Payer: Self-pay | Admitting: Cardiovascular Disease

## 2023-07-10 VITALS — BP 140/60 | HR 71 | Ht 62.0 in | Wt 168.0 lb

## 2023-07-10 DIAGNOSIS — I480 Paroxysmal atrial fibrillation: Secondary | ICD-10-CM | POA: Diagnosis not present

## 2023-07-10 DIAGNOSIS — I2510 Atherosclerotic heart disease of native coronary artery without angina pectoris: Secondary | ICD-10-CM

## 2023-07-10 DIAGNOSIS — I5022 Chronic systolic (congestive) heart failure: Secondary | ICD-10-CM | POA: Diagnosis not present

## 2023-07-10 DIAGNOSIS — I10 Essential (primary) hypertension: Secondary | ICD-10-CM

## 2023-07-10 DIAGNOSIS — I428 Other cardiomyopathies: Secondary | ICD-10-CM

## 2023-07-10 DIAGNOSIS — E785 Hyperlipidemia, unspecified: Secondary | ICD-10-CM

## 2023-07-10 DIAGNOSIS — E782 Mixed hyperlipidemia: Secondary | ICD-10-CM

## 2023-07-10 MED ORDER — LOSARTAN POTASSIUM 50 MG PO TABS
50.0000 mg | ORAL_TABLET | Freq: Every day | ORAL | 3 refills | Status: AC
Start: 2023-07-10 — End: ?

## 2023-07-10 MED ORDER — MECLIZINE HCL 25 MG PO TABS: 25.0000 mg | ORAL_TABLET | Freq: Four times a day (QID) | ORAL | 1 refills | Status: AC | PRN

## 2023-07-10 MED ORDER — METOPROLOL SUCCINATE ER 25 MG PO TB24
25.0000 mg | ORAL_TABLET | Freq: Every day | ORAL | 3 refills | Status: AC
Start: 2023-07-10 — End: ?

## 2023-07-10 NOTE — Patient Instructions (Addendum)
 Medication Instructions:  Please start losartan 50 mg daily Start metoprolol succinate 25 mg daily Monitor blood pressure, call with 2 weeks of numbers  Meclizine 25 mg q6 hrs as needed for vertigo  If you need a refill on your cardiac medications before your next appointment, please call your pharmacy.   Lab work: No new labs needed  Testing/Procedures: No new testing needed  Follow-Up: At [facility], you and your health needs are our priority.  As part of our continuing mission to provide you with exceptional heart care, we have created designated Provider Care Teams.  These Care Teams include your primary Cardiologist (physician) and Advanced Practice Providers (APPs -  Physician Assistants and Nurse Practitioners) who all work together to provide you with the care you need, when you need it.  You will need a follow up appointment in 3 months  Providers on your designated Care Team:   Nicolasa Ducking, NP Eula Listen, PA-C Cadence Fransico Michael, [place]  COVID-19 Vaccine Information can be found at: [email or web address] For questions related to vaccine distribution or appointments, please email [email or web address] or call [phone].

## 2023-08-10 ENCOUNTER — Telehealth: Payer: Self-pay | Admitting: Cardiovascular Disease

## 2023-08-10 NOTE — Telephone Encounter (Signed)
 Pt would like a c/b from nurse regarding BP reading. Please advise

## 2023-08-10 NOTE — Telephone Encounter (Signed)
 Patient had called in due to her blood pressures being significantly elevated.  Readings were as follows:  She was sick and persistently checked her blood pressures during that time. She started having double vision and she went to ED. She reports they scanned her brain and that she did not have a stroke.  She also had blurry vision and threw up.   235/149 206/97 188/92 147/110 169/83 177/84 168/82 165/76 200/113 201/102  23 rd 184/100 157/74 after meds  24 th 185/83  25 th 170/82  Today 209/91  3 different machines and it still remains high.  Dr. Gollan started back medications but were lower doses.   Reviewed signs and symptoms and instructed her to proceed to ED. She refused so was able to schedule her to come in tomorrow to see one of our APPs. She verbalized understanding and was agreeable with the plan. No further questions.

## 2023-08-11 ENCOUNTER — Ambulatory Visit: Attending: Cardiology | Admitting: Cardiology

## 2023-08-11 ENCOUNTER — Encounter: Payer: Self-pay | Admitting: Cardiology

## 2023-08-11 VITALS — BP 132/62 | HR 64 | Ht 62.0 in | Wt 170.6 lb

## 2023-08-11 DIAGNOSIS — N1831 Chronic kidney disease, stage 3a: Secondary | ICD-10-CM

## 2023-08-11 DIAGNOSIS — I2510 Atherosclerotic heart disease of native coronary artery without angina pectoris: Secondary | ICD-10-CM | POA: Diagnosis not present

## 2023-08-11 DIAGNOSIS — I480 Paroxysmal atrial fibrillation: Secondary | ICD-10-CM

## 2023-08-11 DIAGNOSIS — I10 Essential (primary) hypertension: Secondary | ICD-10-CM | POA: Diagnosis not present

## 2023-08-11 DIAGNOSIS — E782 Mixed hyperlipidemia: Secondary | ICD-10-CM | POA: Diagnosis not present

## 2023-08-11 DIAGNOSIS — E1122 Type 2 diabetes mellitus with diabetic chronic kidney disease: Secondary | ICD-10-CM

## 2023-08-11 NOTE — Progress Notes (Signed)
 Cardiology Office Note:  .   Date:  08/11/2023  ID:  Sarah Potts, DOB [birth year], MRN [id number] PCP: Lyle San, MD  [facility] Providers Cardiologist:  Belva Boyden, MD    History of Present Illness: .   Sarah Potts is a 89 y.o. female with a past medical history of paroxysmal atrial fibrillation, type 2 diabetes, hyperlipidemia, primary hypertension, CKD stage III, coronary disease status post PCI, HFpEF, asthma, and anal cancer status post colostomy who is here today to follow-up on elevated blood pressures.   Coronary disease with PCI to the left circumflex in December 2019 in the setting of a non-STEMI while in [place] .  Most recent left heart catheterization September 2015 in the setting of NSTEMI with EF of 40 to 25%.  Cath revealed 30% ostial RCA, 40% proximal RCA, 20% distal RCA, left circumflex mid to distal 10%, proximal left circumflex 40%, proximal to mid LAD 50%, ostial proximal LAD 40%.  Mild to moderate nonobstructive CAD with patent stents, moderate to severely calcified coronary arteries, suspected stress-induced cardiomyopathy.  Repeat echocardiogram in January 2024 revealed LVEF of 60 to 65%.  Longstanding history of atrial fibrillation diagnosed in 2016 in [place]  had been on chronic rivaroxaban , amiodarone  and metoprolol .   Patient was recently hospitalized at [facility] 3/16 - 07/01/2023 with a recent NSTEMI.  She presented to the hospital because of generalized weakness, dizziness, double vision and unsteady gait.  She does not have any chest pain or shortness of breath.  She was recently seen in the ED on 06/27/2023 for Was given a Z-Pak and doxycycline .  Her troponins were elevated to 79, 162, 115.  She was minutes hospitalization back to an acute NSTEMI was treated with IV heparin  drip for total of 48 hours.  Nuclear stress testing 3/15 showed normal LV perfusion, low risk study.  EF showed an LVEF of 55 to 60%, G1 DD, moderate LVH, mitral valve and aortic valve  calcifications without stenosis.  Rivaroxaban  was to be restarted at discharge.  She was considered stable and was discharged from facility on 07/01/2023.  She was last seen in clinic 07/10/2023 by Dr. Gollan with complaint of dizziness, shortness of breath with overexertion and twinges in the chest.  Her Lipitor was increased to 40 mg daily.  Rivaroxaban  15 milligrams daily was continued.  She was restarted on metoprolol  succinate 25 mg daily which was reduced dose.  She was also started on losartan  50 mg daily.  There were trying to avoid restarting her amlodipine  if possible given prior history of leg swelling.  Patient called into the nurse triage line 08/02/23 due to blood pressures being significantly elevated.  She had been sick and consistently checked her blood pressure during that time she was started to have double vision she went to the ED.  She reports they scanned her brain since she did not have a stroke but she also had blurry vision throughout.  Blood pressures were soft 235/149.  Patient stated she had checked her blood pressure on 3 different machines and still remain elevated.  She stated that Dr. Gollan had started back medications but at lower doses and she was scheduled for an appointment today.  She returns to clinic today stating that overall she has been doing fairly well.  She states that her blood pressure will be extremely elevated and she was unsure as to why she had had recent medication changes made during her hospitalization.  She states that she had visual changes and  then vomited.  She was unsure as to why medication she had been on for quite some time had been stopped.  She also states she recently had labs done by her PCP and was advised that she had an elevated potassium.  She has stated that she had blood work done earlier today but she was under the impression the only thing that was checked was an A1c.  When asked if anything was done about her elevated potassium she  states she was given a list of foods of not to eat.  She denies any chest pain shortness of breath or peripheral edema.  Denies any recent hospitalizations or visits to the emergency department.  ROS: 10 point review of systems has been reviewed and considered negative with the exception of what has been listed in HPI  Studies Reviewed: .       Lexiscan  MPI 06/30/2023   Normal pharmacologic myocardial perfusion stress test without evidence of significant ischemia or scar.   Left ventricular systolic function is normal (LVEF > 65%).   Coronary artery calcification/stent(s) and aortic atherosclerosis are noted on the attenuation correction CT.   This is a low-risk study.  2D echo 06/29/2023 1. Left ventricular ejection fraction, by estimation, is 55 to 60%. The  left ventricle has normal function. The left ventricle has no regional  wall motion abnormalities. There is moderate left ventricular hypertrophy.  Left ventricular diastolic  parameters are consistent with Grade I diastolic dysfunction (impaired  relaxation). The global longitudinal strain is abnormal.   2. Right ventricular systolic function is normal. The right ventricular  size is normal. There is normal pulmonary artery systolic pressure.   3. Left atrial size was mildly dilated.   4. The mitral valve is abnormal. No evidence of mitral valve  regurgitation. No evidence of mitral stenosis. Severe mitral annular  calcification.   5. The aortic valve is normal in structure. Aortic valve regurgitation is  not visualized. Aortic valve sclerosis/calcification is present, without  any evidence of aortic stenosis.   6. The inferior vena cava is normal in size with greater than 50%  respiratory variability, suggesting right atrial pressure of 3 mmHg.     Risk Assessment/Calculations:    CHA2DS2-VASc Score = 7   This indicates a 11.2% annual risk of stroke. The patient's score is based upon: CHF History: 1 HTN History: 1 Diabetes  History: 1 Stroke History: 0 Vascular Disease History: 1 Age Score: 2 Gender Score: 1            Physical Exam:   VS:  BP 132/62   Pulse 64   Ht 5\' 2"  (1.575 m)   Wt 170 lb 9.6 oz (77.4 kg)   SpO2 99%   BMI 31.20 kg/m    Wt Readings from Last 3 Encounters:  08/11/23 170 lb 9.6 oz (77.4 kg)  07/10/23 168 lb (76.2 kg)  06/24/23 166 lb (75.3 kg)    GEN: Well nourished, well developed in no acute distress NECK: No JVD; No carotid bruits CARDIAC: RRR, no murmurs, rubs, gallops RESPIRATORY:  Clear to auscultation without rales, wheezing or rhonchi  ABDOMEN: Soft, non-tender, non-distended EXTREMITIES:  No edema; No deformity   ASSESSMENT AND PLAN: .   Coronary artery disease with stable angina with a stent placement in 2019 the left circumflex and repeat catheterization in 2023 for chest pain showed nonobstructive disease.  She also during recent hospitalization underwent stress testing which revealed no ischemia.  She denies any chest pain or  anginal equivalents today.  She is continued on rivaroxaban  and lieu of aspirin  and atorvastatin  40 mg daily.   Primary hypertension blood pressure today of 132/62.  Blood pressure has been running extremely high at home upwards of 230s to 240s.  Detailed list and nursing note.  Today blood pressure is well-controlled.  She has been continued on losartan  50 mg daily and Toprol -XL 25 mg daily.  She was just recently restarted on her losartan  at her last visit with Dr. Gollan 07/10/2023.  Had avoided restarting her amlodipine  due to peripheral edema.  With recent labs at her PCP showed elevated potassium after being restarted on losartan  she is being sent for an additional BMP today she has been encouraged to continue to monitor pressures 1 to 2 hours postmedication administration.  Paroxysmal atrial fibrillation which she is maintaining sinus rhythm today.  She is continued on rivaroxaban  15 mg daily for CHA2DS2-VASc of at least 7 for stroke  prophylaxis.  She is also continued on Toprol -XL 25 mg daily and amiodarone  200 mg daily.  Checking mag level today as well.  Mixed hyperlipidemia with an LDL 43.  She is continued on atorvastatin  40 mg daily.  Chronic renal insufficiency with last serum creatinine of 1.3.  Has remained stable.  She has been encouraged to avoid NSAIDs.  Being sent for an updated BMP today.  Type 2 diabetes where she is continued on twice pioglitazone 30 mg daily.  Ongoing management per PCP.       Dispo: Patient return to clinic to see MD/APP in 4-6 weeks or sooner if needed for reevaluation of blood pressure  Signed, Belton Peplinski, NP

## 2023-08-11 NOTE — Patient Instructions (Signed)
 Medication Instructions:  Your physician recommends that you continue on your current medications as directed. Please refer to the Current Medication list given to you today.  *If you need a refill on your cardiac medications before your next appointment, please call your pharmacy*  Lab Work: Your provider would like for you to have following labs drawn today BMP,MAG.   If you have labs (blood work) drawn today and your tests are completely normal, you will receive your results only by: MyChart Message (if you have MyChart) OR A paper copy in the mail If you have any lab test that is abnormal or we need to change your treatment, we will call you to review the results.  Testing/Procedures: No test ordered today   Follow-Up: At [facility], you and your health needs are our priority.  As part of our continuing mission to provide you with exceptional heart care, our providers are all part of one team.  This team includes your primary Cardiologist (physician) and Advanced Practice Providers or APPs (Physician Assistants and Nurse Practitioners) who all work together to provide you with the care you need, when you need it.  Your next appointment:   4-6 week(s)  Provider:   You may see Timothy Gollan, MD or one of the following Advanced Practice Providers on your designated Care Team:   Laneta Pintos, NP Gildardo Labrador, PA-C Varney Gentleman, PA-C Cadence Springfield, PA-C Ronald Cockayne, NP Morey Ar, NP

## 2023-08-12 LAB — BASIC METABOLIC PANEL WITH GFR
BUN/Creatinine Ratio: 16 (ref 12–28)
BUN: 18 mg/dL (ref 8–27)
CO2: 18 mmol/L — ABNORMAL LOW (ref 20–29)
Calcium: 9.9 mg/dL (ref 8.7–10.3)
Chloride: 101 mmol/L (ref 96–106)
Creatinine, Ser: 1.15 mg/dL — ABNORMAL HIGH (ref 0.57–1.00)
Glucose: 184 mg/dL — ABNORMAL HIGH (ref 70–99)
Potassium: 5 mmol/L (ref 3.5–5.2)
Sodium: 139 mmol/L (ref 134–144)
eGFR: 46 mL/min/{1.73_m2} — ABNORMAL LOW (ref >59)

## 2023-08-12 LAB — MAGNESIUM: Magnesium: 1.3 mg/dL — ABNORMAL LOW (ref 1.6–2.3)

## 2023-08-13 NOTE — Progress Notes (Signed)
 Kidney function remains stable.  Potassium is slightly elevated.  Ensure not to take any potassium supplements and eat foods that are low in potassium in your diet.  Magnesium  level is low recommend starting magnesium  oxide 200 mg twice daily for the next week and repeat BMP and magnesium  level.

## 2023-08-14 ENCOUNTER — Other Ambulatory Visit: Payer: Self-pay | Admitting: *Deleted

## 2023-08-14 DIAGNOSIS — R790 Abnormal level of blood mineral: Secondary | ICD-10-CM

## 2023-08-14 MED ORDER — MAGNESIUM 200 MG PO TABS: 200.0000 mg | ORAL_TABLET | Freq: Two times a day (BID) | ORAL | 0 refills | Status: AC

## 2023-08-18 ENCOUNTER — Other Ambulatory Visit: Payer: Self-pay | Admitting: *Deleted

## 2023-08-18 DIAGNOSIS — D631 Anemia in chronic kidney disease: Secondary | ICD-10-CM

## 2023-08-19 ENCOUNTER — Encounter: Payer: Self-pay | Admitting: Oncology

## 2023-08-19 ENCOUNTER — Inpatient Hospital Stay: Attending: Oncology

## 2023-08-19 ENCOUNTER — Inpatient Hospital Stay: Admitting: Oncology

## 2023-08-19 VITALS — BP 121/50 | HR 64 | Temp 97.9°F | Resp 15 | Wt 167.0 lb

## 2023-08-19 DIAGNOSIS — N1831 Chronic kidney disease, stage 3a: Secondary | ICD-10-CM

## 2023-08-19 DIAGNOSIS — D509 Iron deficiency anemia, unspecified: Secondary | ICD-10-CM | POA: Diagnosis not present

## 2023-08-19 DIAGNOSIS — E538 Deficiency of other specified B group vitamins: Secondary | ICD-10-CM | POA: Diagnosis not present

## 2023-08-19 DIAGNOSIS — E1122 Type 2 diabetes mellitus with diabetic chronic kidney disease: Secondary | ICD-10-CM | POA: Insufficient documentation

## 2023-08-19 DIAGNOSIS — I129 Hypertensive chronic kidney disease with stage 1 through stage 4 chronic kidney disease, or unspecified chronic kidney disease: Secondary | ICD-10-CM | POA: Insufficient documentation

## 2023-08-19 DIAGNOSIS — Z85048 Personal history of other malignant neoplasm of rectum, rectosigmoid junction, and anus: Secondary | ICD-10-CM | POA: Insufficient documentation

## 2023-08-19 DIAGNOSIS — D631 Anemia in chronic kidney disease: Secondary | ICD-10-CM | POA: Diagnosis not present

## 2023-08-19 DIAGNOSIS — R911 Solitary pulmonary nodule: Secondary | ICD-10-CM

## 2023-08-19 LAB — CMP (CANCER CENTER ONLY)
ALT: 35 U/L (ref 0–44)
AST: 66 U/L — ABNORMAL HIGH (ref 15–41)
Albumin: 3.7 g/dL (ref 3.5–5.0)
Alkaline Phosphatase: 84 U/L (ref 38–126)
Anion gap: 9 (ref 5–15)
BUN: 27 mg/dL — ABNORMAL HIGH (ref 8–23)
CO2: 22 mmol/L (ref 22–32)
Calcium: 9.1 mg/dL (ref 8.9–10.3)
Chloride: 104 mmol/L (ref 98–111)
Creatinine: 1.38 mg/dL — ABNORMAL HIGH (ref 0.44–1.00)
GFR, Estimated: 37 mL/min — ABNORMAL LOW (ref >60)
Glucose, Bld: 168 mg/dL — ABNORMAL HIGH (ref 70–99)
Potassium: 4.4 mmol/L (ref 3.5–5.1)
Sodium: 135 mmol/L (ref 135–145)
Total Bilirubin: 0.6 mg/dL (ref 0.0–1.2)
Total Protein: 7.5 g/dL (ref 6.5–8.1)

## 2023-08-19 LAB — IRON AND TIBC
Iron: 59 ug/dL (ref 28–170)
Saturation Ratios: 17 % (ref 10.4–31.8)
TIBC: 340 ug/dL (ref 250–450)
UIBC: 281 ug/dL

## 2023-08-19 LAB — CBC WITH DIFFERENTIAL/PLATELET
Abs Immature Granulocytes: 0.03 10*3/uL (ref 0.00–0.07)
Basophils Absolute: 0 10*3/uL (ref 0.0–0.1)
Basophils Relative: 1 %
Eosinophils Absolute: 0.1 10*3/uL (ref 0.0–0.5)
Eosinophils Relative: 1 %
HCT: 36.1 % (ref 36.0–46.0)
Hemoglobin: 12 g/dL (ref 12.0–15.0)
Immature Granulocytes: 1 %
Lymphocytes Relative: 20 %
Lymphs Abs: 1.1 10*3/uL (ref 0.7–4.0)
MCH: 33.1 pg (ref 26.0–34.0)
MCHC: 33.2 g/dL (ref 30.0–36.0)
MCV: 99.7 fL (ref 80.0–100.0)
Monocytes Absolute: 0.4 10*3/uL (ref 0.1–1.0)
Monocytes Relative: 8 %
Neutro Abs: 3.7 10*3/uL (ref 1.7–7.7)
Neutrophils Relative %: 69 %
Platelets: 262 10*3/uL (ref 150–400)
RBC: 3.62 MIL/uL — ABNORMAL LOW (ref 3.87–5.11)
RDW: 13.9 % (ref 11.5–15.5)
WBC: 5.3 10*3/uL (ref 4.0–10.5)
nRBC: 0 % (ref 0.0–0.2)

## 2023-08-19 LAB — VITAMIN B12: Vitamin B-12: 430 pg/mL (ref 180–914)

## 2023-08-19 LAB — FERRITIN: Ferritin: 31 ng/mL (ref 11–307)

## 2023-08-19 NOTE — Progress Notes (Signed)
 Hematology/Oncology Progress note Telephone:(336) [phone] Fax:([phone]     CHIEF COMPLAINTS/REASON FOR VISIT:  Follow up  history of anal cancer, iron  deficiency anemia, B12 deficiency   ASSESSMENT & PLAN:   Anemia in stage 3a chronic kidney disease (HCC) Anemia of CKD and iron  deficiency anemia Recommend patient to follow up with GI for discussion of repeat colonoscopy.  Labs are reviewed and discussed with patient. Lab Results  Component Value Date   HGB 12.0 08/19/2023   TIBC 340 08/19/2023   IRONPCTSAT 17 08/19/2023   FERRITIN 31 08/19/2023    Hemoglobin stable.  Iron  panel stable. Hold off IV venofer .    B12 deficiency #Vitamin B12 deficiency, intrinsic factor antibody and antiparietal antibody were both negative.  Patient gets B12 injections via her primary care provider's office.  History of anal cancer #History of anal cancer in 2017, she had a concurrent chemoradiation. Patient is now 8 years after initial diagnosis and treatments. Imaging will be obtained if clinically indicated.  Lung nodule March 2025 CT scan showed stable RLL lung nodule 6 mm, unchanged comparing to 2023 likely benign.  Hold off additional imaging surveillance.   No orders of the defined types were placed in this encounter.  Follow up as needed  All questions were answered. The patient knows to call the clinic with any problems, questions or concerns.  Timmy Forbes, MD, PhD [facility] Hematology Oncology 08/19/2023     HISTORY OF PRESENTING ILLNESS:   TERICKA BEYLER is a  89 y.o.  female with PMH listed below was seen in consultation at the request of  Lyle San, MD  for evaluation of history of anal cancer  Patient reports history of anal cancer in 2017. I obtained previous oncology records and extensive medical record review was performed by me  Patient has noticed a lesion around her anus for about 2 years prior to seeking medical attention.  The lesion was mostly  exophytic and about 4 cm in greatest dimension.  The lesion was initially felt to be confined to the perianal skin. 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  Associated heavy inflammatory infiltrate present at the base of the tumor.  Tumor does not extend to inked deep surgical margin.  Patient was seen by radiation oncology, clinical staging was T2N0 M0 and received adjuvant radiation from 7/272017-11/11/2015.  11/22/2015, PET scan showed hypermetabolic activity in the anus consistent with patient's history of anal cancer with metastatic and right iliac chain/inguinal adenopathy.  02/05/2016.  Patient finished an additional radiation treatments with Dr. Phyllis Breeze with attention on just to the right inguinal nodes as well as the primary lesion.  05/30/2016, follow-up PET scan showed resolution of previously noted hypermetabolic inguinal lymph node adenopathy.  Physiological bowel uptake making it difficult to tell whether the residual uptake within the rectum is physiological or residual tumor.  No waning uptake is identified.  Continued follow-up is recommended.  03/17/2018, bone scan showed focal uptake identified involving the right first rib anteriorly at approximately the fifth rib. Patient's pain is primarily in her spine.  No point tenderness over her ribs. 03/24/2018, MRI cervical thoracic lumbar spine showed no metastatic lesion.  Patient has spondylosis.  Patient reports that she had some image scan done in 2020-not available in current medical records that we obtained..  No images done in 2021.  She recalls that she got chemotherapy.-No records available at this point.  01/07/2021, MRI abdomen pelvis with and without  contrast showed no evidence of local anal cancer recurrence.  No metastatic adenopathy in the pelvis or periaortic retroperitoneum.  Left lower quadrant colostomy without complication.  No  hepatic metastasis.  03/13/2021, patient establish care with gastroenterology.  Colonoscopy was recommended for work-up of iron  deficiency anemia.  Patient declined. Patient cannot tolerate oral iron  supplementation and stopped it.  Previously tolerated Venofer  treatments.  #Lung nodule, 10/05/2021, CT chest showed unchanged small nodules of bilateral lower lobes.  No new nodules.  Given the stability, most likely benign incidental.  INTERVAL HISTORY RADIE KALLAS is a 89 y.o. female who has above history reviewed by me today presents for follow up visit for anal cancer, lung nodule, vitamin B12 deficiency. Patient has been on monthly vitamin B12 injections at primary care doctor's office..  She denies rectal bleeding, rectal pain, or black stool.  Recent hospitalization in March due to NSTEMI.  She denies shortness of breath, chest pain  Review of Systems  Constitutional:  Negative for appetite change, chills, fatigue and fever.  HENT:   Negative for hearing loss and voice change.   Eyes:  Negative for eye problems.  Respiratory:  Negative for chest tightness and cough.   Cardiovascular:  Negative for chest pain.  Gastrointestinal:  Negative for abdominal distention, abdominal pain and blood in stool.  Endocrine: Negative for hot flashes.  Genitourinary:  Negative for difficulty urinating and frequency.   Musculoskeletal:  Negative for arthralgias.  Skin:  Negative for itching and rash.  Neurological:  Negative for extremity weakness.  Hematological:  Negative for adenopathy.  Psychiatric/Behavioral:  Negative for confusion.     MEDICAL HISTORY:  Past Medical History:  Diagnosis Date   Anal cancer (HCC)    Anemia    Asthma    CHF (congestive heart failure) (HCC)    Diabetes mellitus    Hyperlipidemia    Hypertension     SURGICAL HISTORY: Past Surgical History:  Procedure Laterality Date   BLADDER SURGERY     CARDIAC CATHETERIZATION     COLOSTOMY     CORONARY  ANGIOPLASTY WITH STENT PLACEMENT  03/25/2018   Stent to the LCX  2.50 mm x 16mm  [employer] REF [id number]   heart stent     LEFT HEART CATH AND CORONARY ANGIOGRAPHY N/A 12/23/2021   Procedure: LEFT HEART CATH AND CORONARY ANGIOGRAPHY;  Surgeon: Wenona Hamilton, MD;  Location: [facility] INVASIVE CV LAB;  Service: Cardiovascular;  Laterality: N/A;   TONSILLECTOMY     uterus surgery     removed    SOCIAL HISTORY: Social History   Socioeconomic History   Marital status: Single    Spouse name: Not on file   Number of children: Not on file   Years of education: Not on file   Highest education level: Not on file  Occupational History   Not on file  Tobacco Use   Smoking status: Never   Smokeless tobacco: Never  Vaping Use   Vaping status: Never Used  Substance and Sexual Activity   Alcohol use: No   Drug use: No   Sexual activity: Not on file  Other Topics Concern   Not on file  Social History Narrative   Not on file   Social Drivers of Health   Financial Resource Strain: Medium Risk (05/20/2023)   [facility] from [facility]   Overall Financial Resource Strain (CARDIA)    Difficulty of Paying Living Expenses: Somewhat hard  Food Insecurity: No Food  Insecurity (06/29/2023)   Hunger Vital Sign    Worried About Running Out of Food in the Last Year: Never true    Ran Out of Food in the Last Year: Never true  Transportation Needs: No Transportation Needs (06/29/2023)   PRAPARE - Administrator, Civil Service (Medical): No    Lack of Transportation (Non-Medical): No  Physical Activity: Not on file  Stress: Not on file  Social Connections: Moderately Integrated (06/29/2023)   Social Connection and Isolation Panel [NHANES]    Frequency of Communication with Friends and Family: More than three times a week    Frequency of Social Gatherings with Friends and Family: More than three times a week    Attends Religious Services:  More than 4 times per year    Active Member of [employer] or Organizations: Yes    Attends Banker Meetings: More than 4 times per year    Marital Status: Widowed  Intimate Partner Violence: Not At Risk (06/29/2023)   Humiliation, Afraid, Rape, and Kick questionnaire    Fear of Current or Ex-Partner: No    Emotionally Abused: No    Physically Abused: No    Sexually Abused: No    FAMILY HISTORY: Family History  Problem Relation Age of Onset   Cancer Mother    Breast cancer Mother    Heart disease Father    Heart attack Father    Cancer Sister    Heart disease Sister    Cervical cancer Sister    Heart disease Brother     ALLERGIES:  is allergic to levofloxacin, niacin, and niacin and related.  MEDICATIONS:  Current Outpatient Medications  Medication Sig Dispense Refill   acetaminophen  (TYLENOL ) 500 MG tablet Take 500 mg by mouth every 4 (four) hours as needed.     albuterol  (VENTOLIN  HFA) 108 (90 Base) MCG/ACT inhaler Inhale 1 puff into the lungs every 4 (four) hours as needed.     amiodarone  (PACERONE ) 200 MG tablet Take 1 tablet (200 mg total) by mouth daily. 90 tablet 3   atorvastatin  (LIPITOR) 40 MG tablet daily.     Cholecalciferol  25 MCG (1000 UT) tablet Take 4,000 Units by mouth daily. Pt takes 3 daily     fexofenadine (ALLEGRA) 60 MG tablet Take 60 mg by mouth daily.     levothyroxine  (SYNTHROID ) 25 MCG tablet Take 25 mcg by mouth daily.     losartan  (COZAAR ) 50 MG tablet Take 1 tablet (50 mg total) by mouth daily. 90 tablet 3   Magnesium  200 MG TABS Take 1 tablet (200 mg total) by mouth 2 (two) times daily for 7 days. 14 tablet 0   meclizine  (ANTIVERT ) 25 MG tablet Take 1 tablet (25 mg total) by mouth every 6 (six) hours as needed. 180 tablet 1   metFORMIN  (GLUCOPHAGE ) 1000 MG tablet Take 500 mg by mouth 2 (two) times daily with a meal.     metoprolol  succinate (TOPROL -XL) 25 MG 24 hr tablet Take 1 tablet (25 mg total) by mouth daily. Take with or immediately  following a meal. 90 tablet 3   montelukast  (SINGULAIR ) 10 MG tablet Take 10 mg by mouth at bedtime.       nitroGLYCERIN  (NITROSTAT ) 0.4 MG SL tablet Place 1 tablet (0.4 mg total) under the tongue every 5 (five) minutes as needed for chest pain. 20 tablet 12   Omega-3 Fatty Acids (FISH OIL) 1000 MG CAPS Take 1,000 mg by mouth in the morning and at bedtime.  omeprazole (PRILOSEC) 40 MG capsule Take 40 mg by mouth daily.       pioglitazone (ACTOS) 30 MG tablet Take 30 mg by mouth daily.     sertraline  (ZOLOFT ) 50 MG tablet Take 50 mg by mouth daily.     TRULICITY 1.5 MG/0.5ML SOPN Inject into the skin once a week.     XARELTO  15 MG TABS tablet TAKE 1 TABLET (15 MG TOTAL) BY MOUTH DAILY. 90 tablet 1   No current facility-administered medications for this visit.     PHYSICAL EXAMINATION: ECOG PERFORMANCE STATUS: 1 - Symptomatic but completely ambulatory Vitals:   08/19/23 1256  BP: (!) 121/50  Pulse: 64  Resp: 15  Temp: 97.9 F (36.6 C)  SpO2: 98%   Filed Weights   08/19/23 1256  Weight: 167 lb (75.8 kg)     Physical Exam Constitutional:      General: She is not in acute distress. HENT:     Head: Normocephalic and atraumatic.  Eyes:     General: No scleral icterus. Cardiovascular:     Rate and Rhythm: Normal rate and regular rhythm.  Pulmonary:     Effort: Pulmonary effort is normal. No respiratory distress.     Breath sounds: No wheezing.  Abdominal:     General: Bowel sounds are normal. There is no distension.     Palpations: Abdomen is soft.     Comments: Colostomy  Musculoskeletal:        General: No deformity. Normal range of motion.     Cervical back: Normal range of motion and neck supple.  Skin:    General: Skin is warm and dry.     Findings: No erythema or rash.  Neurological:     Mental Status: She is alert and oriented to person, place, and time. Mental status is at baseline.  Psychiatric:        Mood and Affect: Mood normal.     LABORATORY DATA:   I have reviewed the data as listed    Latest Ref Rng & Units 08/19/2023   12:23 PM 07/01/2023    4:42 AM 06/30/2023    1:48 AM  CBC  WBC 4.0 - 10.5 K/uL 5.3  5.0  4.9   Hemoglobin 12.0 - 15.0 g/dL 21.3  08.6  57.8   Hematocrit 36.0 - 46.0 % 36.1  35.3  31.9   Platelets 150 - 400 K/uL 262  239  218       Latest Ref Rng & Units 08/19/2023   12:23 PM 08/11/2023    3:03 PM 06/28/2023    6:11 PM  CMP  Glucose 70 - 99 mg/dL 469  629  528   BUN 8 - 23 mg/dL 27  18  24    Creatinine 0.44 - 1.00 mg/dL 4.13  2.44  0.10   Sodium 135 - 145 mmol/L 135  139  135   Potassium 3.5 - 5.1 mmol/L 4.4  5.0  3.9   Chloride 98 - 111 mmol/L 104  101  103   CO2 22 - 32 mmol/L 22  18  18    Calcium  8.9 - 10.3 mg/dL 9.1  9.9  9.3   Total Protein 6.5 - 8.1 g/dL 7.5   7.2   Total Bilirubin 0.0 - 1.2 mg/dL 0.6   1.1   Alkaline Phos 38 - 126 U/L 84   66   AST 15 - 41 U/L 66   107   ALT 0 - 44 U/L 35  64     Iron /TIBC/Ferritin/ %Sat    Component Value Date/Time   IRON  59 08/19/2023 1223   TIBC 340 08/19/2023 1223   FERRITIN 31 08/19/2023 1223   IRONPCTSAT 17 08/19/2023 1223      RADIOGRAPHIC STUDIES: I have personally reviewed the radiological images as listed and agreed with the findings in the report. No results found.

## 2023-08-19 NOTE — Assessment & Plan Note (Signed)
#  History of anal cancer in 2017, she had a concurrent chemoradiation. Patient is now 8 years after initial diagnosis and treatments. Imaging will be obtained if clinically indicated.

## 2023-08-19 NOTE — Assessment & Plan Note (Signed)
#  Vitamin B12 deficiency, intrinsic factor antibody and antiparietal antibody were both negative.  Patient gets B12 injections via her primary care provider's office.

## 2023-08-19 NOTE — Assessment & Plan Note (Addendum)
 March 2025 CT scan showed stable RLL lung nodule 6 mm, unchanged comparing to 2023 likely benign.  Hold off additional imaging surveillance.

## 2023-08-19 NOTE — Assessment & Plan Note (Addendum)
 Anemia of CKD and iron  deficiency anemia Recommend patient to follow up with GI for discussion of repeat colonoscopy.  Labs are reviewed and discussed with patient. Lab Results  Component Value Date   HGB 12.0 08/19/2023   TIBC 340 08/19/2023   IRONPCTSAT 17 08/19/2023   FERRITIN 31 08/19/2023    Hemoglobin stable.  Iron  panel stable. Hold off IV venofer .

## 2023-09-10 ENCOUNTER — Other Ambulatory Visit: Payer: Self-pay | Admitting: Cardiovascular Disease

## 2023-09-15 ENCOUNTER — Encounter: Payer: Self-pay | Admitting: Cardiology

## 2023-09-15 ENCOUNTER — Ambulatory Visit: Attending: Cardiology | Admitting: Cardiology

## 2023-09-15 VITALS — BP 108/40 | HR 64 | Ht 62.0 in | Wt 176.2 lb

## 2023-09-15 DIAGNOSIS — I255 Ischemic cardiomyopathy: Secondary | ICD-10-CM | POA: Diagnosis not present

## 2023-09-15 DIAGNOSIS — E1122 Type 2 diabetes mellitus with diabetic chronic kidney disease: Secondary | ICD-10-CM

## 2023-09-15 DIAGNOSIS — I10 Essential (primary) hypertension: Secondary | ICD-10-CM

## 2023-09-15 DIAGNOSIS — N1831 Chronic kidney disease, stage 3a: Secondary | ICD-10-CM

## 2023-09-15 DIAGNOSIS — I480 Paroxysmal atrial fibrillation: Secondary | ICD-10-CM | POA: Diagnosis not present

## 2023-09-15 DIAGNOSIS — I2510 Atherosclerotic heart disease of native coronary artery without angina pectoris: Secondary | ICD-10-CM | POA: Diagnosis not present

## 2023-09-15 DIAGNOSIS — E782 Mixed hyperlipidemia: Secondary | ICD-10-CM

## 2023-09-15 NOTE — Progress Notes (Signed)
 Cardiology Office Note   Date:  09/15/2023  ID:  Alpha, [name] 01-Feb-1933, MRN [id number] PCP: Sarah San, MD  [facility] Providers Cardiologist:  Sarah Boyden, MD     History of Present Illness Sarah Potts is a 89 y.o. female with a past medical history of paroxysmal atrial fibrillation, type 2 diabetes, hyperlipidemia, primary hypertension, CKD stage III, coronary disease status post PCI, chronic HFpEF, asthma, and anal cancer status post colostomy who is here today for follow-up.   Coronary disease with PCI to the left circumflex in December 2019 in the setting of a non-STEMI while in [place] .  Most recent left heart catheterization September 2015 in the setting of NSTEMI with EF of 40 to 25%.  Cath revealed 30% ostial RCA, 40% proximal RCA, 20% distal RCA, left circumflex mid to distal 10%, proximal left circumflex 40%, proximal to mid LAD 50%, ostial proximal LAD 40%.  Mild to moderate nonobstructive CAD with patent stents, moderate to severely calcified coronary arteries, suspected stress-induced cardiomyopathy.  Repeat echocardiogram in January 2024 revealed LVEF of 60 to 65%.  Longstanding history of atrial fibrillation diagnosed in 2016 in [place]  had been on chronic rivaroxaban , amiodarone  and metoprolol .  She was hospitalized at [facility] 3/16 - 07/01/2023 with a recent NSTEMI.  She presented to the hospital with generalized weakness, dizziness, double vision, and unsteady gait.  She was recently evaluated in the emergency department 07/02/2023 and was treated with a Z-Pak and doxycycline .  High-sensitivity troponins peaked at 162.  She was treated for an acute NSTEMI with IV heparin  for total of 48 hours.  She underwent nuclear stress testing 3/15 that showed normal LV perfusion was considered low risk study.  Echocardiogram revealed LVEF 55-60%, G1 DD, moderate LVH, mitral valve and aortic valve calcifications without stenosis.  Rivaroxaban  was to be restarted at discharge.   She was considered stable and discharged from the facility on 07/01/2023.  She was seen in clinic 07/10/2023 by Dr. Gollan with complaint of dizziness, shortness of breath with overexertion and twinges in her chest.  Lipitor was increased to 40 mg daily, rivaroxaban  15 mg was continued.  She was restarted on metoprolol  succinate 25 mg daily and losartan  50 mg daily.  Will avoid restarting amlodipine  given prior history of leg swelling.   She was last seen in clinic 08/11/2023.  She called the nurse triage line and due to significantly elevated blood pressures.  Overall she had been doing fairly well she had her blood pressure been extremely elevated and she was unsure as to why she had had recent medication changes made during hospitalization.  She states that she had visual changes and then vomited.  She recently had labs done by her PCP and was advised that she had an elevated potassium.  When asked if anything was done about her elevated potassium she states she was given a list of foods not to eat.  She denies any other associated symptoms.  She was sent for updated labs to evaluate previously elevated potassium level after restarting losartan .  Blood pressure was controlled so there were no other medication changes that were made at that time.  She returns clinic today stating that overall she has been doing fairly well from a cardiac perspective.  She has noted a decrease in her blood pressure in the afternoons after recently being started back on amlodipine  and gabapentin for neuropathy.  She states that she has been compliant with her current medication regimen without any undue side  effects.  She has not missed any doses of her rivaroxaban  denies any bleeding with no blood noted in her urine but she did notice just the other day that she had a smear of blood in her depends but nothing unusual.  She states that she has no longer being followed by oncology for anal cancer so she will keep a close eye on  if she has any further episodes of blood noted will follow-up if needed.  She stated she had 1 episode of shortness of breath coming from the parking lot into clinic today for appointment.  Denies any chest pain, peripheral edema or palpitations.  Denies any recent hospitalizations or visits to the emergency department.  ROS: 10 point review of systems has been reviewed and considered negative except ones were listed in the HPI  Studies Reviewed EKG Interpretation Date/Time:  Tuesday September 15 2023 14:24:38 EDT Ventricular Rate:  64 PR Interval:  208 QRS Duration:  142 QT Interval:  448 QTC Calculation: 462 R Axis:   -8  Text Interpretation: Normal sinus rhythm Left bundle branch block , intermittently noted on previous studies When compared with ECG of 10-Jul-2023 09:03, Otherwise no significant change Confirmed by Ronald Cockayne ([id number]) on 09/15/2023 2:33:36 PM    Lexiscan  MPI 06/30/2023   Normal pharmacologic myocardial perfusion stress test without evidence of significant ischemia or scar.   Left ventricular systolic function is normal (LVEF > 65%).   Coronary artery calcification/stent(s) and aortic atherosclerosis are noted on the attenuation correction CT.   This is a low-risk study.   2D echo 06/29/2023 1. Left ventricular ejection fraction, by estimation, is 55 to 60%. The  left ventricle has normal function. The left ventricle has no regional  wall motion abnormalities. There is moderate left ventricular hypertrophy.  Left ventricular diastolic  parameters are consistent with Grade I diastolic dysfunction (impaired  relaxation). The global longitudinal strain is abnormal.   2. Right ventricular systolic function is normal. The right ventricular  size is normal. There is normal pulmonary artery systolic pressure.   3. Left atrial size was mildly dilated.   4. The mitral valve is abnormal. No evidence of mitral valve  regurgitation. No evidence of mitral stenosis. Severe mitral  annular  calcification.   5. The aortic valve is normal in structure. Aortic valve regurgitation is  not visualized. Aortic valve sclerosis/calcification is present, without  any evidence of aortic stenosis.   6. The inferior vena cava is normal in size with greater than 50%  respiratory variability, suggesting right atrial pressure of 3 mmHg.   Risk Assessment/Calculations  CHA2DS2-VASc Score = 7   This indicates a 11.2% annual risk of stroke. The patient's score is based upon: CHF History: 1 HTN History: 1 Diabetes History: 1 Stroke History: 0 Vascular Disease History: 1 Age Score: 2 Gender Score: 1            Physical Exam VS:  BP (!) 108/40 (BP Location: Left Arm, Patient Position: Sitting, Cuff Size: Normal)   Pulse 64   Ht 5\' 2"  (1.575 m)   Wt 176 lb 4 oz (79.9 kg)   SpO2 97%   BMI 32.24 kg/m    Wt Readings from Last 3 Encounters:  09/15/23 176 lb 4 oz (79.9 kg)  08/19/23 167 lb (75.8 kg)  08/11/23 170 lb 9.6 oz (77.4 kg)    GEN: Well nourished, well developed in no acute distress NECK: No JVD; No carotid bruits CARDIAC: RRR, no murmurs, rubs, gallops RESPIRATORY:  Clear to auscultation without rales, wheezing or rhonchi  ABDOMEN: Soft, non-tender, non-distended EXTREMITIES:  No edema; No deformity   ASSESSMENT AND PLAN Coronary artery disease stable angina with last stent placement in 2019 to the left circumflex and repeat catheterization in 2023 for chest pain revealing nonobstructive disease.  EKG today reveals sinus rhythm at a rate of 64 with for screaming blood in the left bundle branch block.  Last stress testing during prior hospitalization revealed no ischemia or infarct.  She denies any anginal anginal equivalents today.  She is continue rivaroxaban  15 mg daily and lieu of aspirin  and atorvastatin  40 mg daily.  No further ischemic testing at this time.  Primary hypertension with blood pressure today 108/48.  Diastolic is slightly low today.  She has been  having blood pressures running upwards of 230s -240s.  She continues to monitor her pressures at home and she has been running 120s and 130s over upper 50s and low 60s.  She stated that she had recently been placed back on amlodipine  and gabapentin to help with her neuropathy is helping.  Discussed potential medication changes with lower blood pressure patient states that she would like to continue to monitor blood pressure at home close or before any medication changes being made.  Paroxysmal atrial fibrillation which she is maintaining sinus rhythm with a rate of 64 today.  She is continued on rivaroxaban  15 mg daily for CHA2DS2-VASc score of at least 7 for stroke prophylaxis.  She is also continued on Toprol -XL and amiodarone .  Mixed hyperlipidemia with last LDL remaining at goal.  She is continued on atorvastatin  40 mg daily.  Chronic renal insufficiency with last serum creatinine 1.38 which is stable at her baseline of 1.3.  She has been encouraged to avoid NSAIDs and maintain adequate hydration.  Type 2 diabetes she has been continued on pioglitazone with ongoing management per PCP.       Dispo: Patient return to clinic to see MD/APP in 3 months or sooner if needed  Signed, Aramis Weil, NP

## 2023-09-15 NOTE — Patient Instructions (Signed)
 Medication Instructions:  Your physician recommends that you continue on your current medications as directed. Please refer to the Current Medication list given to you today.   *If you need a refill on your cardiac medications before your next appointment, please call your pharmacy*  Lab Work: No labs ordered today  If you have labs (blood work) drawn today and your tests are completely normal, you will receive your results only by: MyChart Message (if you have MyChart) OR A paper copy in the mail If you have any lab test that is abnormal or we need to change your treatment, we will call you to review the results.  Testing/Procedures: No test ordered today   Follow-Up: At [facility], you and your health needs are our priority.  As part of our continuing mission to provide you with exceptional heart care, our providers are all part of one team.  This team includes your primary Cardiologist (physician) and Advanced Practice Providers or APPs (Physician Assistants and Nurse Practitioners) who all work together to provide you with the care you need, when you need it.  Your next appointment:   3 month(s)  Provider:   Timothy Gollan, MD or Ronald Cockayne, NP

## 2023-10-12 ENCOUNTER — Ambulatory Visit: Admitting: Student

## 2023-12-07 ENCOUNTER — Other Ambulatory Visit: Payer: Self-pay | Admitting: Cardiovascular Disease

## 2023-12-16 ENCOUNTER — Ambulatory Visit: Admitting: Cardiology

## 2023-12-25 NOTE — Progress Notes (Signed)
 History of Present Illness:   Sarah Potts is a 89 y.o. female here for persistent coughing, postnasal drainage, sneezing, and congestion.  She has a history of asthma, and takes albuterol  as needed.  She saw Dr. Nonnie on August 18.  She was given doxycycline , prednisone, and benzonatate .  She reports some interval improvement.  However, she reports return of symptoms over the last week.  She denies any fever or chills.  She denies any sick contacts.  She states that the benzonatate  does not work for her.  She does have chronic kidney disease.   Past Medical History:   Past Medical History:  Diagnosis Date  . Abdominal hernia    HH  . Arthritis   . Asthma (HHS-HCC)   . Breast tumor    benign  . Cerebrovascular disease   . Chickenpox   . Colon polyp   . Coronary artery disease   . Depression   . Diabetes (CMS/HHS-HCC)   . Dyslipidemia   . Gastritis   . H/O adenomatous polyp of colon   . History of bone density study 03/01/2007,02/10/2013  . History of cancer   . History of cataract   . History of myocardial infarction   . Hyperlipidemia   . Hypertension   . Internal hemorrhoids   . Sleep apnea     Past Surgical History:   Past Surgical History:  Procedure Laterality Date  . COLONOSCOPY  05/14/2000   Normal Colon  . COLONOSCOPY  08/18/2006   Adenomatous Polyp, [facility] (Sister)  . COLONOSCOPY  01/10/2010   Adenomatous Polyp, [facility] (Sister)  . COLONOSCOPY  03/02/2014   Adenomatous Polyps, [facility] (Sister): [facility] 02/2017; Recall Ltr mailed 01/09/2017 (dw)  . EGD  03/02/2014   No repeat per RTE (dw)  . CHOLECYSTECTOMY    . coronary stent    . EGD  11/23/2000,08/18/2006,01/10/2010  . Hysterectomy     17 years ago for uterine prolapse along with bladder tack.    Allergies:   Allergies  Allergen Reactions  . Levaquin [Levofloxacin] Nausea    Patient reports that she thinks that she could take this medication.   . Niacin Other (See Comments)    Hot, Flushed     Current Medications:   Prior to Admission medications  Medication Sig Taking? Last Dose  acetaminophen  (TYLENOL ) 500 MG tablet Take by mouth Yes PRN Not Currently Taking  albuterol  MDI, PROVENTIL , VENTOLIN , PROAIR , HFA 90 mcg/actuation inhaler Inhale 2 inhalations into the lungs every 4 (four) hours as needed for Wheezing Yes PRN Not Currently Taking  AMIOdarone  (PACERONE ) 200 MG tablet Take 200 mg by mouth once daily Yes Taking  amLODIPine  (NORVASC ) 5 MG tablet TAKE 1 TABLET BY MOUTH EVERY DAY Yes Taking  atorvastatin  (LIPITOR) 20 MG tablet Take 20 mg by mouth once daily Yes Taking  atropine 1 % ophthalmic solution USE 1 DROP IN THE LEFT EYE ONCE DAILY Yes Taking  cholecalciferol  (VITAMIN D3) 1000 unit tablet Take 3,000 Units by mouth once daily Yes Taking  dulaglutide (TRULICITY) 1.5 mg/0.5 mL subcutaneous pen injector INJECT 0.5 MLS (1.5 MG TOTAL) SUBCUTANEOUSLY EVERY 7 (SEVEN) DAYS Yes Taking  HYDROcodone -acetaminophen  (NORCO) 5-325 mg tablet Take 1 tablet by mouth every 6 (six) hours as needed for Pain Yes PRN Not Currently Taking  inhalational spacer (AEROCHAMBER) spacer Please instruct patient on use Yes Taking  levothyroxine  (SYNTHROID ) 25 MCG tablet TAKE 1 TABLET BY MOUTH ONCE DAILY TAKE ON AN EMPTY STOMACH WITH A GLASS OF WATER AT LEAST 30-60  MINUTES BEFORE BREAKFAST. Yes Taking  losartan  (COZAAR ) 50 MG tablet Take 50 mg by mouth once daily Yes Taking  meclizine  (ANTIVERT ) 25 mg tablet Take by mouth Yes PRN Not Currently Taking  metFORMIN  (GLUCOPHAGE ) 500 MG tablet TAKE 1 TABLET BY MOUTH TWICE A DAY WITH MEALS Yes Taking  metoprolol  SUCCinate (TOPROL -XL) 25 MG XL tablet TAKE 1 TABLET BY MOUTH DAILY. TAKE WITH OR IMMEDIATELY FOLLOWING A MEAL. Yes Taking  montelukast  (SINGULAIR ) 10 mg tablet take 1 tablet by mouth every day Yes Taking  nitroGLYcerin  (NITRODUR) 0.2 mg/hr patch PLACE 1 PATCH ONTO THE SKIN ONCE DAILY LEAVE PATCH(ES) ON FOR 12-14 HOURS, THEN REMOVE FOR 10-12 HOURS PRIOR  TO APPLYING THE NEXT PATCH. Yes PRN Not Currently Taking  nystatin (MYCOSTATIN) 100,000 unit/gram powder APPLY TOPICALLY TWICE A DAY Yes PRN Not Currently Taking  omega 3-dha-epa-fish oil (FISH OIL) 100-160-1,000 mg Cap Take 2 capsules by mouth 2 (two) times daily Yes Taking  omeprazole (PRILOSEC) 40 MG DR capsule TAKE 1 CAPSULE BY MOUTH EVERY DAY Yes Taking  pioglitazone (ACTOS) 30 MG tablet TAKE 1 TABLET BY MOUTH EVERY DAY Yes Taking  pregabalin  (LYRICA ) 100 MG capsule Take 1 capsule (100 mg total) by mouth 2 (two) times daily Yes Taking  rivaroxaban  (XARELTO ) 15 mg tablet Take 15 mg by mouth daily with breakfast Yes Taking  sertraline  (ZOLOFT ) 50 MG tablet Take 1 tablet (50 mg total) by mouth once daily Yes Taking  azithromycin  (ZITHROMAX ) 250 MG tablet 2 tabs on day one, then 1 tab daily x 4 days    dorzolamide-timoloL (COSOPT) 22.3-6.8 mg/mL ophthalmic solution ADMINISTER 1 DROP INTO LEFT EYE THREE TIMES DAILY. Patient not taking: Reported on 12/25/2023  Not Taking  FUROsemide (LASIX) 20 MG tablet Take 1 tablet (20 mg total) by mouth once daily for 3 days Patient not taking: Reported on 06/24/2023    gabapentin (NEURONTIN) 300 MG capsule Take 1 capsule (300 mg total) by mouth 2 (two) times daily Patient not taking: Reported on 11/30/2023    metoprolol  succinate (TOPROL -XL) 50 MG XL tablet Take 25 mg by mouth once daily Patient not taking: Reported on 12/25/2023  Not Taking  prednisoLONE acetate (PRED FORTE) 1 % ophthalmic suspension USE 1 DROP IN THE LEFT EYE 4 TIMES A DAY Patient not taking: Reported on 12/25/2023  Not Taking  predniSONE (DELTASONE) 20 MG tablet Take 2 tablets (40 mg total) by mouth once daily for 5 days    promethazine-dextromethorphan (PROMETHAZINE-DM) 6.25-15 mg/5 mL syrup Take 5 mLs by mouth every 8 (eight) hours as needed for Cough      Family History:   Family History  Problem Relation Name Age of Onset  . Hyperlipidemia (Elevated cholesterol) Mother    . High  blood pressure (Hypertension) Mother    . Myocardial Infarction (Heart attack) Mother         MI before age 67  . Breast cancer Mother    . Hyperlipidemia (Elevated cholesterol) Father    . High blood pressure (Hypertension) Father    . Myocardial Infarction (Heart attack) Father         MI before age 48  . Cancer Sister    . Uterine cancer Sister    . Colon cancer Sister    . Stroke Sister    . Heart disease Sister    . No Known Problems Sister    . High blood pressure (Hypertension) Brother    . Diabetes type II Brother    . Heart disease  Brother    . Diabetes type II Brother    . Arthritis Brother    . Heart disease Daughter    . Diabetes Daughter    . Gallbladder disease Daughter    . Heart disease Son    . Heart disease Son    . COPD Son    . Colon cancer Other Sibling   . Cancer Other Sibling        bladder cancer, uterine cancer  . Heart disease Other Sibling   . Diabetes Other Sibling     Social History:   Social History   Socioeconomic History  . Marital status: Single  Tobacco Use  . Smoking status: Never    Passive exposure: Never  . Smokeless tobacco: Never  Vaping Use  . Vaping status: Never Used  Substance and Sexual Activity  . Alcohol use: No    Alcohol/week: 0.0 standard drinks of alcohol  . Drug use: No  . Sexual activity: Defer   Social Drivers of Health   Financial Resource Strain: Low Risk  (10/19/2023)   Overall Financial Resource Strain (CARDIA)   . Difficulty of Paying Living Expenses: Not very hard  Food Insecurity: No Food Insecurity (10/19/2023)   Hunger Vital Sign   . Worried About Programme researcher, broadcasting/film/video in the Last Year: Never true   . Ran Out of Food in the Last Year: Never true  Transportation Needs: No Transportation Needs (10/19/2023)   PRAPARE - Transportation   . Lack of Transportation (Medical): No   . Lack of Transportation (Non-Medical): No  Social Connections: Moderately Integrated (06/29/2023)   Received from [facility]    Social Connection and Isolation Panel   . In a typical week, how many times do you talk on the phone with family, friends, or neighbors?: More than three times a week   . How often do you get together with friends or relatives?: More than three times a week   . How often do you attend church or religious services?: More than 4 times per year   . Do you belong to any clubs or organizations such as church groups, unions, fraternal or athletic groups, or school groups?: Yes   . How often do you attend meetings of the clubs or organizations you belong to?: More than 4 times per year   . Are you married, widowed, divorced, separated, never married, or living with a partner?: Widowed  Housing Stability: Low Risk  (10/19/2023)   Housing Stability Vital Sign   . Unable to Pay for Housing in the Last Year: No   . Number of Times Moved in the Last Year: 0   . Homeless in the Last Year: No    Review of Systems:   Review of Systems  Constitutional:  Negative for chills and fever.  HENT:  Positive for congestion, postnasal drip, rhinorrhea and sneezing. Negative for ear pain and sore throat.   Eyes:  Negative for pain.  Respiratory:  Positive for cough. Negative for shortness of breath and wheezing.   Gastrointestinal:  Negative for abdominal pain.  Skin:  Negative for rash.  Allergic/Immunologic: Positive for environmental allergies.  Neurological:  Negative for dizziness, light-headedness and headaches.  All other systems reviewed and are negative.   Vitals:   Vitals:   12/25/23 0938  BP: (!) 140/70  Pulse: 68  Temp: 36.7 C (98.1 F)  TempSrc: Oral  SpO2: 97%  Weight: 82.6 kg (182 lb)  Height: 157.5 cm (5' 2)  Body mass index is 33.29 kg/m.  Physical Exam:   Physical Exam Vitals and nursing note reviewed.  Constitutional:      General: She is not in acute distress.    Appearance: She is well-developed. She is not diaphoretic.  HENT:     Head: Normocephalic and atraumatic.      Right Ear: Hearing, tympanic membrane, ear canal and external ear normal.     Left Ear: Hearing, tympanic membrane, ear canal and external ear normal.     Nose: Congestion present.     Mouth/Throat:     Pharynx: Oropharynx is clear. Uvula midline. Postnasal drip present. No oropharyngeal exudate or posterior oropharyngeal erythema.     Tonsils: No tonsillar abscesses.  Eyes:     General: No scleral icterus.       Right eye: No discharge.        Left eye: No discharge.     Conjunctiva/sclera: Conjunctivae normal.     Pupils: Pupils are equal, round, and reactive to light.  Cardiovascular:     Rate and Rhythm: Normal rate and regular rhythm.     Heart sounds: No murmur heard.    No friction rub. No gallop.  Pulmonary:     Effort: Pulmonary effort is normal. No respiratory distress.     Breath sounds: Normal breath sounds. No wheezing.  Chest:     Chest wall: No tenderness.  Abdominal:     Comments:    Musculoskeletal:        General: Normal range of motion.     Cervical back: Normal range of motion and neck supple.  Lymphadenopathy:     Cervical: No cervical adenopathy.  Skin:    General: Skin is warm and dry.  Neurological:     Mental Status: She is alert and oriented to person, place, and time.  Psychiatric:        Behavior: Behavior normal.        Thought Content: Thought content normal.        Judgment: Judgment normal.     Assessment and Plan:  No results found for this visit on 12/25/23.  Diagnoses and all orders for this visit:  Acute upper respiratory infection  Asthma, unspecified asthma severity, unspecified whether complicated, unspecified whether persistent (HHS-HCC)  Acute cough  Other orders -     azithromycin  (ZITHROMAX ) 250 MG tablet; 2 tabs on day one, then 1 tab daily x 4 days -     promethazine-dextromethorphan (PROMETHAZINE-DM) 6.25-15 mg/5 mL syrup; Take 5 mLs by mouth every 8 (eight) hours as needed for Cough -     predniSONE (DELTASONE)  20 MG tablet; Take 2 tablets (40 mg total) by mouth once daily for 5 days    Patient Instructions  Take medications as directed. Return to care should your symptoms not improve, or please present to the nearest Emergency Department should your symptoms change or worsen in any way.   Portions of this note were created using dictation software and may contain typographical errors.   Patient received an After Visit Summary
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
## 2024-01-05 ENCOUNTER — Encounter: Payer: Self-pay | Admitting: Cardiology

## 2024-01-05 ENCOUNTER — Ambulatory Visit: Attending: Cardiology | Admitting: Cardiology

## 2024-01-05 VITALS — BP 108/50 | HR 61 | Ht 62.0 in | Wt 184.4 lb

## 2024-01-05 DIAGNOSIS — I255 Ischemic cardiomyopathy: Secondary | ICD-10-CM | POA: Diagnosis not present

## 2024-01-05 DIAGNOSIS — I480 Paroxysmal atrial fibrillation: Secondary | ICD-10-CM | POA: Diagnosis not present

## 2024-01-05 DIAGNOSIS — R0602 Shortness of breath: Secondary | ICD-10-CM

## 2024-01-05 DIAGNOSIS — I2510 Atherosclerotic heart disease of native coronary artery without angina pectoris: Secondary | ICD-10-CM | POA: Diagnosis not present

## 2024-01-05 DIAGNOSIS — N1831 Chronic kidney disease, stage 3a: Secondary | ICD-10-CM

## 2024-01-05 DIAGNOSIS — I10 Essential (primary) hypertension: Secondary | ICD-10-CM | POA: Diagnosis not present

## 2024-01-05 DIAGNOSIS — I5032 Chronic diastolic (congestive) heart failure: Secondary | ICD-10-CM

## 2024-01-05 DIAGNOSIS — E1122 Type 2 diabetes mellitus with diabetic chronic kidney disease: Secondary | ICD-10-CM

## 2024-01-05 DIAGNOSIS — E782 Mixed hyperlipidemia: Secondary | ICD-10-CM

## 2024-01-05 NOTE — Patient Instructions (Signed)
 Medication Instructions:  Your physician recommends that you continue on your current medications as directed. Please refer to the Current Medication list given to you today.   *If you need a refill on your cardiac medications before your next appointment, please call your pharmacy*  Lab Work: Your provider would like for you to have following labs drawn today BMP, BNP.   If you have labs (blood work) drawn today and your tests are completely normal, you will receive your results only by: MyChart Message (if you have MyChart) OR A paper copy in the mail If you have any lab test that is abnormal or we need to change your treatment, we will call you to review the results.  Testing/Procedures: No test ordered today   Follow-Up: At [facility], you and your health needs are our priority.  As part of our continuing mission to provide you with exceptional heart care, our providers are all part of one team.  This team includes your primary Cardiologist (physician) and Advanced Practice Providers or APPs (Physician Assistants and Nurse Practitioners) who all work together to provide you with the care you need, when you need it.  Your next appointment:   4 week(s)  Provider:   You may see Timothy Gollan, MD or one of the following Advanced Practice Providers on your designated Care Team:   Lonni Meager, NP Lesley Maffucci, PA-C Bernardino Bring, PA-C Cadence Lincoln, PA-C Tylene Lunch, NP Barnie Hila, NP

## 2024-01-05 NOTE — Progress Notes (Signed)
 Cardiology Office Note   Date:  01/05/2024  ID:  Sarah Potts, Sarah Potts 03-04-1933, MRN [id number] PCP: Valora Agent, MD  [facility] Providers Cardiologist:  Evalene Lunger, MD     History of Present Illness Sarah Potts is a 89 y.o. female with past medical history of paroxysmal atrial fibrillation, type 2 diabetes, hyperlipidemia, primary hypertension, CKD stage III, coronary artery disease status post PCI, chronic HFpEF, asthma, and anal cancer status post colostomy, who is here today for follow-up.   Coronary disease with PCI to the left circumflex in December 2019 in the setting of a non-STEMI while in [place] .  Most recent left heart catheterization September 2015 in the setting of NSTEMI with EF of 40 to 25%.  Cath revealed 30% ostial RCA, 40% proximal RCA, 20% distal RCA, left circumflex mid to distal 10%, proximal left circumflex 40%, proximal to mid LAD 50%, ostial proximal LAD 40%.  Mild to moderate nonobstructive CAD with patent stents, moderate to severely calcified coronary arteries, suspected stress-induced cardiomyopathy.  Repeat echocardiogram in January 2024 revealed LVEF of 60 to 65%.  Longstanding history of atrial fibrillation diagnosed in 2016 in [place]  had been on chronic rivaroxaban , amiodarone  and metoprolol .  She was hospitalized at [facility] 3/16 - 07/01/2023 with a recent NSTEMI.  She presented to the hospital with generalized weakness, dizziness, double vision, and unsteady gait.  She was recently evaluated in the emergency department 07/02/2023 and was treated with a Z-Pak and doxycycline .  High-sensitivity troponins peaked at 162.  She was treated for an acute NSTEMI with IV heparin  for total of 48 hours.  She underwent nuclear stress testing 3/15 that showed normal LV perfusion was considered low risk study.  Echocardiogram revealed LVEF 55-60%, G1 DD, moderate LVH, mitral valve and aortic valve calcifications without stenosis.  Rivaroxaban  was to be restarted at  discharge.  She was considered stable and discharged from the facility on 07/01/2023.  She was seen in clinic 07/10/2023 by Dr. Gollan with complaint of dizziness, shortness of breath with overexertion and twinges in her chest.  Lipitor was increased to 40 mg daily, rivaroxaban  15 mg was continued.  She was restarted on metoprolol  succinate 25 mg daily and losartan  50 mg daily.  Will avoid restarting amlodipine  given prior history of leg swelling.  She was seen in clinic in April 2025 and she had called the nurse triage line due to significantly elevated blood pressure.  Overall she had been doing well and her blood pressures been extremely elevated she was unsure as to why.  She did recently have medication changes made during her hospitalization.   She was last seen in clinic 09/15/2023 stating overall she was doing fairly well from a cardiac perspective.  She had noted a decrease in her blood pressure in the afternoon since recently being started back on amlodipine  and gabapentin for neuropathy.  She has been compliant with the remainder of her medications.  There were no other medication changes that were made or follow-up testing that was ordered.  She returns to clinic today stating overall for the cardiac perspective she has been okay.  She states that she has had a cough since about August that has been congested.  She has been on 2 rounds of antibiotics and prednisone with no relief.  PCR swab was negative for COVID/flu/RSV.  She states that she has had shortness of breath due to the cough.  States that she has been compliant with her current medication regimen.  Has not  missed any doses of her rivaroxaban  denies any bleeding with no blood noted in the stool or urine.  Denies any hospitalizations or visits to the emergency department.  ROS: 10 point review of systems has been reviewed and considered negative except was been listed in the HPI  Studies Reviewed EKG Interpretation Date/Time:  Tuesday  January 05 2024 13:43:37 EDT Ventricular Rate:  61 PR Interval:  216 QRS Duration:  138 QT Interval:  458 QTC Calculation: 461 R Axis:   -16  Text Interpretation: Sinus rhythm with 1st degree A-V block Left bundle branch block When compared with ECG of 15-Sep-2023 14:24, No significant change was found Confirmed by Gerard Frederick ([id number]) on 01/05/2024 1:48:43 PM    Lexiscan  MPI 06/30/2023   Normal pharmacologic myocardial perfusion stress test without evidence of significant ischemia or scar.   Left ventricular systolic function is normal (LVEF > 65%).   Coronary artery calcification/stent(s) and aortic atherosclerosis are noted on the attenuation correction CT.   This is a low-risk study.   2D echo 06/29/2023 1. Left ventricular ejection fraction, by estimation, is 55 to 60%. The  left ventricle has normal function. The left ventricle has no regional  wall motion abnormalities. There is moderate left ventricular hypertrophy.  Left ventricular diastolic  parameters are consistent with Grade I diastolic dysfunction (impaired  relaxation). The global longitudinal strain is abnormal.   2. Right ventricular systolic function is normal. The right ventricular  size is normal. There is normal pulmonary artery systolic pressure.   3. Left atrial size was mildly dilated.   4. The mitral valve is abnormal. No evidence of mitral valve  regurgitation. No evidence of mitral stenosis. Severe mitral annular  calcification.   5. The aortic valve is normal in structure. Aortic valve regurgitation is  not visualized. Aortic valve sclerosis/calcification is present, without  any evidence of aortic stenosis.   6. The inferior vena cava is normal in size with greater than 50%  respiratory variability, suggesting right atrial pressure of 3 mmHg.   Risk Assessment/Calculations  CHA2DS2-VASc Score = 7   This indicates a 11.2% annual risk of stroke. The patient's score is based upon: CHF History: 1 HTN  History: 1 Diabetes History: 1 Stroke History: 0 Vascular Disease History: 1 Age Score: 2 Gender Score: 1            Physical Exam VS:  BP (!) 108/50   Pulse 61   Ht 5' 2 (1.575 m)   Wt 184 lb 6.4 oz (83.6 kg)   SpO2 98%   BMI 33.73 kg/m        Wt Readings from Last 3 Encounters:  01/05/24 184 lb 6.4 oz (83.6 kg)  09/15/23 176 lb 4 oz (79.9 kg)  08/19/23 167 lb (75.8 kg)    GEN: Well nourished, well developed in no acute distress NECK: No JVD; No carotid bruits CARDIAC: RRR, no murmurs, rubs, gallops RESPIRATORY:  Clear to auscultation without rales, wheezing or rhonchi  ABDOMEN: Soft, non-tender, non-distended EXTREMITIES:  No edema; No deformity   ASSESSMENT AND PLAN HFimpEF and shortness of breath and cough since around August.  She tach noted in her weight but she states that her appetite has been increased secondary to the increased amount of steroids that she has been on the last month.  She has been sent for BNP today.  And a BMP to determine kidney function.  If her BNP is elevated she may need low-dose furosemide.  Previous echocardiogram completed March  2025 revealed LVEF 55 to 60%, no RWMA, moderate LVH, G1 DD, severe mitral annular calcification, and aortic valve sclerosis without stenosis.  Coronary artery disease with unstable angina with last stent placed in 2019 to the left circumflex and repeat catheterization in 2023 revealing nonobstructive disease.  He has remained chest pain-free today.  EKG today reveals sinus rhythm with for screening with rate of 61 with a chronic left bundle branch block.  She is continued on rivaroxaban  50 mg daily and lieu of aspirin  and atorvastatin  40 mg daily.  No further ischemic workup needed at this time.  Primary hypertension with a blood pressure today of 108/50.  Blood pressure has been stable.  She has not had swings of blood pressures being back on amlodipine  2.5 mg daily, losartan  50 mg daily and Toprol -XL 25 mg daily.  She  has been encouraged to continue to monitor pressures 1 to 2 hours postmedication administration at home as well.  Paroxysmal atrial fibrillation she is maintaining sinus rhythm on EKG today.  She is continued on rivaroxaban  50 mg daily for CHA2DS2-VASc score of at least 7 for stroke prophylaxis.  She is also continued on Toprol -XL and amiodarone .  Recent labs showed stable thyroid  function.  She has tolerated rivaroxaban  without issues of bleeding.  Chronic renal insufficiency CKD stage III she has been encouraged to maintain adequate hydration and continue to avoid NSAIDs.  She has been sent for an updated BMP today.  Mixed hyperlipidemia with last LDL 43 which remains at goal.  She is continued on atorvastatin  40 mg daily.  Type 2 diabetes with a hemoglobin A1c of 6.6.  Ongoing management per PCP.       Dispo: Patient to return to clinic to see MD/APP in 4 weeks or sooner for further evaluation if needed.  Signed, Zahriyah Joo, NP

## 2024-01-06 LAB — BASIC METABOLIC PANEL WITH GFR
BUN/Creatinine Ratio: 12 (ref 12–28)
BUN: 22 mg/dL (ref 8–27)
CO2: 21 mmol/L (ref 20–29)
Calcium: 9.2 mg/dL (ref 8.7–10.3)
Chloride: 104 mmol/L (ref 96–106)
Creatinine, Ser: 1.8 mg/dL — ABNORMAL HIGH (ref 0.57–1.00)
Glucose: 101 mg/dL — ABNORMAL HIGH (ref 70–99)
Potassium: 5.1 mmol/L (ref 3.5–5.2)
Sodium: 139 mmol/L (ref 134–144)
eGFR: 27 mL/min/1.73 — ABNORMAL LOW (ref >59)

## 2024-01-06 LAB — BRAIN NATRIURETIC PEPTIDE: BNP: 307.2 pg/mL — ABNORMAL HIGH (ref 0.0–100.0)

## 2024-01-07 ENCOUNTER — Ambulatory Visit: Payer: Self-pay | Admitting: Cardiology

## 2024-01-07 DIAGNOSIS — I10 Essential (primary) hypertension: Secondary | ICD-10-CM

## 2024-01-07 NOTE — Progress Notes (Signed)
 Kidney function slightly worsened than previously.  Recommend maintaining adequate hydration, avoiding NSAIDs. Recommend repeating BMP in 2 weeks. Increase could be related to recently being treated with steroids.

## 2024-01-13 ENCOUNTER — Emergency Department

## 2024-01-13 ENCOUNTER — Emergency Department
Admission: EM | Admit: 2024-01-13 | Discharge: 2024-01-13 | Disposition: A | Discharge status: Home / Self Care | Attending: Emergency Medicine | Admitting: Emergency Medicine

## 2024-01-13 ENCOUNTER — Encounter: Payer: Self-pay | Admitting: Intensive Care

## 2024-01-13 ENCOUNTER — Other Ambulatory Visit: Payer: Self-pay

## 2024-01-13 DIAGNOSIS — I2510 Atherosclerotic heart disease of native coronary artery without angina pectoris: Secondary | ICD-10-CM | POA: Insufficient documentation

## 2024-01-13 DIAGNOSIS — S0990XA Unspecified injury of head, initial encounter: Secondary | ICD-10-CM | POA: Insufficient documentation

## 2024-01-13 DIAGNOSIS — S39012A Strain of muscle, fascia and tendon of lower back, initial encounter: Secondary | ICD-10-CM | POA: Diagnosis not present

## 2024-01-13 DIAGNOSIS — W19XXXA Unspecified fall, initial encounter: Secondary | ICD-10-CM | POA: Insufficient documentation

## 2024-01-13 DIAGNOSIS — I5032 Chronic diastolic (congestive) heart failure: Secondary | ICD-10-CM | POA: Insufficient documentation

## 2024-01-13 DIAGNOSIS — E1122 Type 2 diabetes mellitus with diabetic chronic kidney disease: Secondary | ICD-10-CM | POA: Diagnosis not present

## 2024-01-13 DIAGNOSIS — I130 Hypertensive heart and chronic kidney disease with heart failure and stage 1 through stage 4 chronic kidney disease, or unspecified chronic kidney disease: Secondary | ICD-10-CM | POA: Diagnosis not present

## 2024-01-13 DIAGNOSIS — E1140 Type 2 diabetes mellitus with diabetic neuropathy, unspecified: Secondary | ICD-10-CM | POA: Diagnosis not present

## 2024-01-13 DIAGNOSIS — S161XXA Strain of muscle, fascia and tendon at neck level, initial encounter: Secondary | ICD-10-CM | POA: Diagnosis not present

## 2024-01-13 DIAGNOSIS — S3992XA Unspecified injury of lower back, initial encounter: Secondary | ICD-10-CM | POA: Diagnosis present

## 2024-01-13 DIAGNOSIS — Z85048 Personal history of other malignant neoplasm of rectum, rectosigmoid junction, and anus: Secondary | ICD-10-CM | POA: Diagnosis not present

## 2024-01-13 DIAGNOSIS — N1831 Chronic kidney disease, stage 3a: Secondary | ICD-10-CM | POA: Diagnosis not present

## 2024-01-13 MED ORDER — ACETAMINOPHEN ER 650 MG PO TBCR: 650.0000 mg | EXTENDED_RELEASE_TABLET | Freq: Three times a day (TID) | ORAL | 0 refills | Status: AC | PRN

## 2024-01-13 MED ORDER — LIDOCAINE 5 % EX PTCH
1.0000 | MEDICATED_PATCH | CUTANEOUS | Status: DC
  Administered 2024-01-13: 1 via TRANSDERMAL
  Filled 2024-01-13: qty 1

## 2024-01-13 MED ORDER — ACETAMINOPHEN 325 MG PO TABS
650.0000 mg | ORAL_TABLET | Freq: Once | ORAL | Status: AC
  Administered 2024-01-13: 650 mg via ORAL
  Filled 2024-01-13: qty 2

## 2024-01-13 MED ORDER — LIDOCAINE 5 % EX PTCH
1.0000 | MEDICATED_PATCH | Freq: Two times a day (BID) | CUTANEOUS | 0 refills | Status: DC
Start: 2024-01-13 — End: 2024-01-20

## 2024-01-13 NOTE — ED Provider Notes (Addendum)
 [facility] Provider Note    Event Date/Time   First MD Initiated Contact with Patient 01/13/24 1802     (approximate)   History   Back Pain and Fall    HPI  Sarah Potts is a 89 y.o. female    with a past medical history of neuropathy, spondylosis, elevated liver enzymes, B12 deficiency, chronic kidney disease, B12 deficiency coronary artery disease, anemia, hypertension, lumbar stenosis, A-fib,who presents to the ED complaining of fall. According to the patient, she fell this morning on her back.  She was unable to get up, her son came and helped her.  Patient was able to walk.  In the afternoon she laid down, back pain got worse.  Patient is taking blood thinners.     Patient Active Problem List   Diagnosis Date Noted   Dizziness 06/29/2023   HFrEF (heart failure with reduced ejection fraction) (HCC) 06/29/2023   Closed right tibial fracture 03/13/2023   Fall 03/06/2023   H/O adenomatous polyp of colon 09/29/2022   Stress-induced cardiomyopathy    Ischemic cardiomyopathy    NSTEMI (non-ST elevated myocardial infarction) (HCC) 12/20/2021   Atrial fibrillation, chronic (HCC) 12/20/2021   Type II diabetes mellitus with renal manifestations (HCC) 12/20/2021   Obesity with body mass index (BMI) of 30.0 to 39.9 12/20/2021   Chronic diastolic CHF (congestive heart failure) (HCC) 12/20/2021   UTI (urinary tract infection) 12/20/2021   Asthma 12/01/2021   Atrial fibrillation (HCC) 12/01/2021   Colostomy in place (HCC) 03/16/2021   Absolute anemia 01/09/2021   Lung nodule 01/09/2021   Anemia in stage 3a chronic kidney disease (HCC) 01/09/2021   B12 deficiency 01/09/2021   Stage 3a chronic kidney disease (HCC) 01/09/2021   Goals of care, counseling/discussion 12/18/2020   History of anal cancer 12/11/2020   Diarrhea 01/02/2014   Iron  deficiency anemia 01/02/2014   Bronchitis 12/15/2012   CAD (coronary artery disease) 11/13/2010   HTN (hypertension)  11/13/2010   Diabetes mellitus (HCC) 11/13/2010   Hyperlipidemia 11/13/2010    ROS: Patient currently denies any vision changes, tinnitus, difficulty speaking, facial droop, sore throat, chest pain, shortness of breath, abdominal pain, nausea/vomiting/diarrhea, dysuria, or weakness/numbness/paresthesias in any extremity   Physical Exam   Triage Vital Signs: ED Triage Vitals [01/13/24 1645]  Encounter Vitals Group     BP      Girls Systolic BP Percentile      Girls Diastolic BP Percentile      Boys Systolic BP Percentile      Boys Diastolic BP Percentile      Pulse      Resp      Temp 98.3 F (36.8 C)     Temp Source Oral     SpO2      Weight 182 lb (82.6 kg)     Height 5' 2 (1.575 m)     Head Circumference      Peak Flow      Pain Score 10     Pain Loc      Pain Education      Exclude from Growth Chart     Most recent vital signs: Vitals:   01/13/24 1645 01/13/24 1806  Temp: 98.3 F (36.8 C)   SpO2:  98%     Physical Exam Vitals and nursing note reviewed.  No blood pressure or heart rate respiratory rate during triage.  Patient was afebrile during triage  General:  Awake, no distress.  Head: Occipital scalp without lacerations, tender to palpation. CV:                  Good peripheral perfusion.  Resp:               Normal effort. no tachypnea Abd:                 No distention.  Soft nontender Other:              Shoulder: Trapezius muscle tender to palpation. Lumbar spine: Paraspinal muscles tender to palpation.  Bilateral negative SLR.  No signs of saddle anesthesia. No other signs of trauma. ED Results / Procedures / Treatments   Labs (all labs ordered are listed, but only abnormal results are displayed) Labs Reviewed - No data to display     RADIOLOGY I independently reviewed and interpreted imaging and agree with radiologists findings.      PROCEDURES:  Critical Care performed:   Procedures   MEDICATIONS ORDERED IN  ED: Medications  acetaminophen  (TYLENOL ) tablet 650 mg (has no administration in time range)  lidocaine  (LIDODERM ) 5 % 1 patch (has no administration in time range)      IMPRESSION / MDM / ASSESSMENT AND PLAN / ED COURSE  I reviewed the triage vital signs and the nursing notes.  Differential diagnosis includes, but is not limited to, fall, intracranial hemorrhage, lumbar fracture, lumbar subluxation  Patient's presentation is most consistent with acute complicated illness / injury requiring diagnostic workup.   [name] is a 89 y.o., female who was brought today by his son after having a fall this morning.  Patient denied loss of consciousness but she is taking blood thinners.  Physical exam there was tenderness to palpation in the occipital scalp without ecchymosis or hematomas.  Tenderness to palpation on her trapezius muscle, tenderness to palpation in paraspinal lumbar muscles.  No signs of saddle anesthesia.  No other signs of trauma.   Plan Acetaminophen  650 Lidocaine  patch Check vital signs Follow-up with orthopedic Patient's diagnosis is consistent with fall, trapezius spasm, lumbar strain. I independently reviewed and interpreted imaging and agree with radiologists findings ruling out intracranial hemorrhage, cervical fracture, lumbar fracture or subluxation.  Did not order labs.  I did review the patient's allergies and medications.The patient is in stable and satisfactory condition for discharge home  Patient will be discharged home with prescriptions for Tylenol , lidocaine  patch, Flexeril. Patient is to follow up with orthopedics  as needed or otherwise directed. Patient is given ED precautions to return to the ED for any worsening or new symptoms. Discussed plan of care with patient, answered all of patient's questions, Patient agreeable to plan of care. Advised patient to take medications according to the instructions on the label. Discussed possible side effects of new  medications. Patient verbalized understanding.  FINAL CLINICAL IMPRESSION(S) / ED DIAGNOSES   Final diagnoses:  Strain of lumbar region, initial encounter  Fall, initial encounter  Strain of neck muscle, initial encounter     Rx / DC Orders   ED Discharge Orders          Ordered    acetaminophen  (ACETAMINOPHEN  8 HOUR) 650 MG CR tablet  Every 8 hours PRN        01/13/24 1852    lidocaine  (LIDODERM ) 5 %  Every 12 hours        01/13/24 1852  Note:  This document was prepared using Dragon voice recognition software and may include unintentional dictation errors.   Janit Kast, PA-C 01/13/24 1853    Janit Kast, PA-C 01/13/24 1854    Waymond Lorelle Cummins, MD 01/13/24 [phone]

## 2024-01-13 NOTE — Discharge Instructions (Addendum)
 You have been diagnosed with fall, strain of the neck, lumbar strain.  You can take acetaminophen  650 mg 1 tablet by mouth every 8 hours.  Please apply 1 patch into your skin in the lumbar area every 12 hours.  You can apply ice pack on your back.  Please call orthopedics and make an appointment for a follow-up.  Please come back to ED or go to your PCP if you have new symptoms or symptoms worsen.

## 2024-01-13 NOTE — ED Triage Notes (Signed)
 Arrived by [facility] From home for unwitnessed fall this morning around 9AM. Hit head on wall. Denies LOC. C/o lower back pain   Takes blood thinner  EMS vitals: 117CBG 130/96 b/p 60-75HR 99% RA 20RR A&O x4

## 2024-01-15 ENCOUNTER — Emergency Department

## 2024-01-15 ENCOUNTER — Other Ambulatory Visit: Payer: Self-pay

## 2024-01-15 ENCOUNTER — Inpatient Hospital Stay
Admission: EM | Admit: 2024-01-15 | Discharge: 2024-01-20 | DRG: 552 | Disposition: A | Discharge status: Skilled Nursing Facility | Attending: Student in an Organized Health Care Education/Training Program | Admitting: Student in an Organized Health Care Education/Training Program

## 2024-01-15 DIAGNOSIS — K219 Gastro-esophageal reflux disease without esophagitis: Secondary | ICD-10-CM | POA: Diagnosis present

## 2024-01-15 DIAGNOSIS — M48061 Spinal stenosis, lumbar region without neurogenic claudication: Principal | ICD-10-CM | POA: Diagnosis present

## 2024-01-15 DIAGNOSIS — M545 Low back pain, unspecified: Secondary | ICD-10-CM | POA: Diagnosis present

## 2024-01-15 DIAGNOSIS — N1832 Chronic kidney disease, stage 3b: Secondary | ICD-10-CM | POA: Diagnosis present

## 2024-01-15 DIAGNOSIS — F32A Depression, unspecified: Secondary | ICD-10-CM | POA: Diagnosis present

## 2024-01-15 DIAGNOSIS — M4807 Spinal stenosis, lumbosacral region: Secondary | ICD-10-CM | POA: Diagnosis present

## 2024-01-15 DIAGNOSIS — Z881 Allergy status to other antibiotic agents status: Secondary | ICD-10-CM

## 2024-01-15 DIAGNOSIS — M5440 Lumbago with sciatica, unspecified side: Principal | ICD-10-CM

## 2024-01-15 DIAGNOSIS — Z8249 Family history of ischemic heart disease and other diseases of the circulatory system: Secondary | ICD-10-CM

## 2024-01-15 DIAGNOSIS — Z85048 Personal history of other malignant neoplasm of rectum, rectosigmoid junction, and anus: Secondary | ICD-10-CM

## 2024-01-15 DIAGNOSIS — T380X5A Adverse effect of glucocorticoids and synthetic analogues, initial encounter: Secondary | ICD-10-CM | POA: Diagnosis present

## 2024-01-15 DIAGNOSIS — Z7901 Long term (current) use of anticoagulants: Secondary | ICD-10-CM

## 2024-01-15 DIAGNOSIS — Z79890 Hormone replacement therapy (postmenopausal): Secondary | ICD-10-CM

## 2024-01-15 DIAGNOSIS — M549 Dorsalgia, unspecified: Secondary | ICD-10-CM | POA: Diagnosis not present

## 2024-01-15 DIAGNOSIS — I2510 Atherosclerotic heart disease of native coronary artery without angina pectoris: Secondary | ICD-10-CM | POA: Diagnosis present

## 2024-01-15 DIAGNOSIS — Z888 Allergy status to other drugs, medicaments and biological substances status: Secondary | ICD-10-CM

## 2024-01-15 DIAGNOSIS — E1122 Type 2 diabetes mellitus with diabetic chronic kidney disease: Secondary | ICD-10-CM | POA: Diagnosis present

## 2024-01-15 DIAGNOSIS — I130 Hypertensive heart and chronic kidney disease with heart failure and stage 1 through stage 4 chronic kidney disease, or unspecified chronic kidney disease: Secondary | ICD-10-CM | POA: Diagnosis present

## 2024-01-15 DIAGNOSIS — I480 Paroxysmal atrial fibrillation: Secondary | ICD-10-CM | POA: Diagnosis present

## 2024-01-15 DIAGNOSIS — Z79899 Other long term (current) drug therapy: Secondary | ICD-10-CM

## 2024-01-15 DIAGNOSIS — M5441 Lumbago with sciatica, right side: Secondary | ICD-10-CM | POA: Diagnosis not present

## 2024-01-15 DIAGNOSIS — E785 Hyperlipidemia, unspecified: Secondary | ICD-10-CM | POA: Diagnosis present

## 2024-01-15 DIAGNOSIS — Z7985 Long-term (current) use of injectable non-insulin antidiabetic drugs: Secondary | ICD-10-CM

## 2024-01-15 DIAGNOSIS — R5381 Other malaise: Secondary | ICD-10-CM | POA: Diagnosis present

## 2024-01-15 DIAGNOSIS — Z933 Colostomy status: Secondary | ICD-10-CM

## 2024-01-15 DIAGNOSIS — G8929 Other chronic pain: Secondary | ICD-10-CM | POA: Diagnosis present

## 2024-01-15 DIAGNOSIS — F419 Anxiety disorder, unspecified: Secondary | ICD-10-CM | POA: Diagnosis present

## 2024-01-15 DIAGNOSIS — R2689 Other abnormalities of gait and mobility: Secondary | ICD-10-CM

## 2024-01-15 DIAGNOSIS — Z955 Presence of coronary angioplasty implant and graft: Secondary | ICD-10-CM

## 2024-01-15 DIAGNOSIS — M5386 Other specified dorsopathies, lumbar region: Secondary | ICD-10-CM | POA: Insufficient documentation

## 2024-01-15 DIAGNOSIS — Z66 Do not resuscitate: Secondary | ICD-10-CM | POA: Diagnosis present

## 2024-01-15 DIAGNOSIS — E1165 Type 2 diabetes mellitus with hyperglycemia: Secondary | ICD-10-CM | POA: Diagnosis present

## 2024-01-15 DIAGNOSIS — I5032 Chronic diastolic (congestive) heart failure: Secondary | ICD-10-CM | POA: Diagnosis present

## 2024-01-15 DIAGNOSIS — J45909 Unspecified asthma, uncomplicated: Secondary | ICD-10-CM | POA: Diagnosis present

## 2024-01-15 DIAGNOSIS — E871 Hypo-osmolality and hyponatremia: Secondary | ICD-10-CM | POA: Diagnosis present

## 2024-01-15 DIAGNOSIS — W19XXXA Unspecified fall, initial encounter: Secondary | ICD-10-CM | POA: Diagnosis present

## 2024-01-15 DIAGNOSIS — R52 Pain, unspecified: Secondary | ICD-10-CM

## 2024-01-15 LAB — BASIC METABOLIC PANEL WITH GFR
Anion gap: 7 (ref 5–15)
BUN: 14 mg/dL (ref 8–23)
CO2: 24 mmol/L (ref 22–32)
Calcium: 8.8 mg/dL — ABNORMAL LOW (ref 8.9–10.3)
Chloride: 105 mmol/L (ref 98–111)
Creatinine, Ser: 1.23 mg/dL — ABNORMAL HIGH (ref 0.44–1.00)
GFR, Estimated: 42 mL/min — ABNORMAL LOW (ref >60)
Glucose, Bld: 128 mg/dL — ABNORMAL HIGH (ref 70–99)
Potassium: 4.1 mmol/L (ref 3.5–5.1)
Sodium: 136 mmol/L (ref 135–145)

## 2024-01-15 LAB — CBC WITH DIFFERENTIAL/PLATELET
Abs Immature Granulocytes: 0.04 K/uL (ref 0.00–0.07)
Basophils Absolute: 0 K/uL (ref 0.0–0.1)
Basophils Relative: 1 %
Eosinophils Absolute: 0.2 K/uL (ref 0.0–0.5)
Eosinophils Relative: 2 %
HCT: 32.6 % — ABNORMAL LOW (ref 36.0–46.0)
Hemoglobin: 10.6 g/dL — ABNORMAL LOW (ref 12.0–15.0)
Immature Granulocytes: 1 %
Lymphocytes Relative: 14 %
Lymphs Abs: 0.9 K/uL (ref 0.7–4.0)
MCH: 31.3 pg (ref 26.0–34.0)
MCHC: 32.5 g/dL (ref 30.0–36.0)
MCV: 96.2 fL (ref 80.0–100.0)
Monocytes Absolute: 0.5 K/uL (ref 0.1–1.0)
Monocytes Relative: 7 %
Neutro Abs: 4.9 K/uL (ref 1.7–7.7)
Neutrophils Relative %: 75 %
Platelets: 211 K/uL (ref 150–400)
RBC: 3.39 MIL/uL — ABNORMAL LOW (ref 3.87–5.11)
RDW: 15.7 % — ABNORMAL HIGH (ref 11.5–15.5)
WBC: 6.6 K/uL (ref 4.0–10.5)
nRBC: 0 % (ref 0.0–0.2)

## 2024-01-15 LAB — HEMOGLOBIN A1C
Hgb A1c MFr Bld: 6.7 % — ABNORMAL HIGH (ref 4.8–5.6)
Mean Plasma Glucose: 145.59 mg/dL

## 2024-01-15 LAB — GLUCOSE, CAPILLARY: Glucose-Capillary: 285 mg/dL — ABNORMAL HIGH (ref 70–99)

## 2024-01-15 LAB — URINALYSIS, ROUTINE W REFLEX MICROSCOPIC
Bilirubin Urine: NEGATIVE
Glucose, UA: NEGATIVE mg/dL
Hgb urine dipstick: NEGATIVE
Ketones, ur: NEGATIVE mg/dL
Leukocytes,Ua: NEGATIVE
Nitrite: NEGATIVE
Protein, ur: NEGATIVE mg/dL
Specific Gravity, Urine: 1.008 (ref 1.005–1.030)
pH: 5 (ref 5.0–8.0)

## 2024-01-15 MED ORDER — SERTRALINE HCL 50 MG PO TABS
50.0000 mg | ORAL_TABLET | Freq: Every day | ORAL | Status: DC
  Administered 2024-01-16 – 2024-01-20 (×5): 50 mg via ORAL
  Filled 2024-01-15 (×5): qty 1

## 2024-01-15 MED ORDER — FENTANYL CITRATE PF 50 MCG/ML IJ SOSY
50.0000 ug | PREFILLED_SYRINGE | Freq: Once | INTRAMUSCULAR | Status: AC
  Administered 2024-01-15: 50 ug via INTRAMUSCULAR
  Filled 2024-01-15: qty 1

## 2024-01-15 MED ORDER — ONDANSETRON 4 MG PO TBDP
4.0000 mg | ORAL_TABLET | Freq: Once | ORAL | Status: AC
  Administered 2024-01-15: 4 mg via ORAL
  Filled 2024-01-15: qty 1

## 2024-01-15 MED ORDER — ATORVASTATIN CALCIUM 20 MG PO TABS
40.0000 mg | ORAL_TABLET | Freq: Every day | ORAL | Status: DC
  Administered 2024-01-16 – 2024-01-19 (×4): 40 mg via ORAL
  Filled 2024-01-15 (×6): qty 2

## 2024-01-15 MED ORDER — PREDNISONE 20 MG PO TABS
40.0000 mg | ORAL_TABLET | Freq: Every day | ORAL | Status: DC
  Administered 2024-01-16 – 2024-01-19 (×4): 40 mg via ORAL
  Filled 2024-01-15 (×4): qty 2

## 2024-01-15 MED ORDER — SENNOSIDES-DOCUSATE SODIUM 8.6-50 MG PO TABS
1.0000 | ORAL_TABLET | Freq: Every evening | ORAL | Status: DC | PRN
  Administered 2024-01-16: 1 via ORAL
  Filled 2024-01-15 (×2): qty 1

## 2024-01-15 MED ORDER — RIVAROXABAN 15 MG PO TABS
15.0000 mg | ORAL_TABLET | Freq: Every day | ORAL | Status: DC
  Administered 2024-01-16 – 2024-01-19 (×4): 15 mg via ORAL
  Filled 2024-01-15 (×6): qty 1

## 2024-01-15 MED ORDER — ALBUTEROL SULFATE (2.5 MG/3ML) 0.083% IN NEBU: 3.0000 mL | INHALATION_SOLUTION | RESPIRATORY_TRACT | Status: DC | PRN

## 2024-01-15 MED ORDER — PANTOPRAZOLE SODIUM 40 MG PO TBEC
40.0000 mg | DELAYED_RELEASE_TABLET | Freq: Every day | ORAL | Status: DC
  Administered 2024-01-16 – 2024-01-20 (×5): 40 mg via ORAL
  Filled 2024-01-15 (×5): qty 1

## 2024-01-15 MED ORDER — AMLODIPINE BESYLATE 5 MG PO TABS
2.5000 mg | ORAL_TABLET | Freq: Every day | ORAL | Status: DC
  Administered 2024-01-16 – 2024-01-20 (×5): 2.5 mg via ORAL
  Filled 2024-01-15 (×5): qty 1

## 2024-01-15 MED ORDER — LOSARTAN POTASSIUM 50 MG PO TABS
50.0000 mg | ORAL_TABLET | Freq: Every day | ORAL | Status: DC
Start: 2024-01-15 — End: 2024-01-20
  Administered 2024-01-15 – 2024-01-20 (×6): 50 mg via ORAL
  Filled 2024-01-15 (×6): qty 1

## 2024-01-15 MED ORDER — OXYCODONE HCL 5 MG PO TABS
5.0000 mg | ORAL_TABLET | Freq: Four times a day (QID) | ORAL | Status: DC | PRN
  Administered 2024-01-17 – 2024-01-20 (×6): 5 mg via ORAL
  Filled 2024-01-15 (×7): qty 1

## 2024-01-15 MED ORDER — DEXAMETHASONE SODIUM PHOSPHATE 10 MG/ML IJ SOLN
10.0000 mg | Freq: Once | INTRAMUSCULAR | Status: AC
  Administered 2024-01-15: 10 mg via INTRAVENOUS
  Filled 2024-01-15: qty 1

## 2024-01-15 MED ORDER — ONDANSETRON HCL 4 MG PO TABS: 4.0000 mg | ORAL_TABLET | Freq: Four times a day (QID) | ORAL | Status: DC | PRN

## 2024-01-15 MED ORDER — MORPHINE SULFATE (PF) 4 MG/ML IV SOLN
4.0000 mg | Freq: Once | INTRAVENOUS | Status: AC
  Administered 2024-01-15: 4 mg via INTRAVENOUS
  Filled 2024-01-15: qty 1

## 2024-01-15 MED ORDER — AMIODARONE HCL 200 MG PO TABS
200.0000 mg | ORAL_TABLET | Freq: Every day | ORAL | Status: DC
  Administered 2024-01-16 – 2024-01-20 (×5): 200 mg via ORAL
  Filled 2024-01-15 (×5): qty 1

## 2024-01-15 MED ORDER — ACETAMINOPHEN 325 MG PO TABS: 650.0000 mg | ORAL_TABLET | Freq: Three times a day (TID) | ORAL | Status: DC | PRN

## 2024-01-15 MED ORDER — MONTELUKAST SODIUM 10 MG PO TABS
10.0000 mg | ORAL_TABLET | Freq: Every day | ORAL | Status: DC
  Administered 2024-01-16 – 2024-01-19 (×4): 10 mg via ORAL
  Filled 2024-01-15 (×4): qty 1

## 2024-01-15 MED ORDER — INSULIN ASPART 100 UNIT/ML IJ SOLN
0.0000 [IU] | Freq: Three times a day (TID) | INTRAMUSCULAR | Status: DC
  Administered 2024-01-16: 2 [IU] via SUBCUTANEOUS
  Administered 2024-01-16: 5 [IU] via SUBCUTANEOUS
  Administered 2024-01-16: 2 [IU] via SUBCUTANEOUS
  Administered 2024-01-17: 3 [IU] via SUBCUTANEOUS
  Administered 2024-01-17: 1 [IU] via SUBCUTANEOUS
  Administered 2024-01-17 – 2024-01-18 (×2): 2 [IU] via SUBCUTANEOUS
  Administered 2024-01-18 (×2): 5 [IU] via SUBCUTANEOUS
  Administered 2024-01-19: 1 [IU] via SUBCUTANEOUS
  Administered 2024-01-19: 3 [IU] via SUBCUTANEOUS
  Administered 2024-01-19: 7 [IU] via SUBCUTANEOUS
  Administered 2024-01-20 (×2): 1 [IU] via SUBCUTANEOUS
  Filled 2024-01-15 (×14): qty 1

## 2024-01-15 MED ORDER — MECLIZINE HCL 25 MG PO TABS
25.0000 mg | ORAL_TABLET | Freq: Four times a day (QID) | ORAL | Status: DC | PRN
Start: 2024-01-15 — End: 2024-01-20

## 2024-01-15 MED ORDER — CAPSAICIN 0.025 % EX CREA
TOPICAL_CREAM | Freq: Two times a day (BID) | CUTANEOUS | Status: DC
  Filled 2024-01-15: qty 1

## 2024-01-15 MED ORDER — ONDANSETRON HCL 4 MG/2ML IJ SOLN: 4.0000 mg | Freq: Four times a day (QID) | INTRAMUSCULAR | Status: DC | PRN

## 2024-01-15 MED ORDER — LEVOTHYROXINE SODIUM 25 MCG PO TABS
25.0000 ug | ORAL_TABLET | Freq: Every day | ORAL | Status: DC
  Administered 2024-01-16 – 2024-01-20 (×5): 25 ug via ORAL
  Filled 2024-01-15 (×5): qty 1

## 2024-01-15 MED ORDER — METOPROLOL SUCCINATE ER 25 MG PO TB24
25.0000 mg | ORAL_TABLET | Freq: Every day | ORAL | Status: DC
  Administered 2024-01-15 – 2024-01-20 (×6): 25 mg via ORAL
  Filled 2024-01-15 (×6): qty 1

## 2024-01-15 MED ORDER — LORATADINE 10 MG PO TABS
10.0000 mg | ORAL_TABLET | Freq: Every day | ORAL | Status: DC
  Administered 2024-01-15 – 2024-01-19 (×5): 10 mg via ORAL
  Filled 2024-01-15 (×5): qty 1

## 2024-01-15 MED ORDER — GABAPENTIN 300 MG PO CAPS: 300.0000 mg | ORAL_CAPSULE | Freq: Two times a day (BID) | ORAL | Status: DC

## 2024-01-15 MED ORDER — BISACODYL 5 MG PO TBEC: 5.0000 mg | DELAYED_RELEASE_TABLET | Freq: Every day | ORAL | Status: DC | PRN

## 2024-01-15 MED ORDER — HYDROMORPHONE HCL 1 MG/ML IJ SOLN
0.5000 mg | INTRAMUSCULAR | Status: DC | PRN
  Administered 2024-01-15 – 2024-01-19 (×13): 1 mg via INTRAVENOUS
  Filled 2024-01-15 (×14): qty 1

## 2024-01-15 NOTE — ED Provider Notes (Signed)
 [facility] Provider Note    Event Date/Time   First MD Initiated Contact with Patient 01/15/24 1130     (approximate)   History   Fall   HPI  Sarah Potts is a 89 y.o. female history of hypertension, hyperlipidemia, diabetes, asthma, CHF presents emergency department with continued low back pain.  Patient was seen here 2 days ago for a fall.  Had CTs of her head, C-spine, and lumbar spine.  All were negative for fracture, however patient states that she is hurting more on the right side and is unable to stand without difficulty.  Her son states she was not able to get out of bed without help.  Here in the ED she has not been able to stand on the right side secondary to pain      Physical Exam   Triage Vital Signs: ED Triage Vitals  Encounter Vitals Group     BP 01/15/24 1055 (!) 185/68     Girls Systolic BP Percentile --      Girls Diastolic BP Percentile --      Boys Systolic BP Percentile --      Boys Diastolic BP Percentile --      Pulse Rate 01/15/24 1055 69     Resp 01/15/24 1055 18     Temp 01/15/24 1055 98.3 F (36.8 C)     Temp Source 01/15/24 1055 Oral     SpO2 01/15/24 1055 100 %     Weight 01/15/24 1056 182 lb (82.6 kg)     Height 01/15/24 1056 5' 2 (1.575 m)     Head Circumference --      Peak Flow --      Pain Score 01/15/24 1100 10     Pain Loc --      Pain Education --      Exclude from Growth Chart --     Most recent vital signs: Vitals:   01/15/24 1055  BP: (!) 185/68  Pulse: 69  Resp: 18  Temp: 98.3 F (36.8 C)  SpO2: 100%     General: Awake, no distress.   CV:  Good peripheral perfusion Resp:  Normal effort. Abd:  No distention.   Other:  Lumbar spine mildly tender, right hip mildly tender, left hip mildly tender, patient unable to stand, neurovascular intact   ED Results / Procedures / Treatments   Labs (all labs ordered are listed, but only abnormal results are displayed) Labs Reviewed  BASIC  METABOLIC PANEL WITH GFR - Abnormal; Notable for the following components:      Result Value   Glucose, Bld 128 (*)    Creatinine, Ser 1.23 (*)    Calcium  8.8 (*)    GFR, Estimated 42 (*)    All other components within normal limits  CBC WITH DIFFERENTIAL/PLATELET - Abnormal; Notable for the following components:   RBC 3.39 (*)    Hemoglobin 10.6 (*)    HCT 32.6 (*)    RDW 15.7 (*)    All other components within normal limits  URINALYSIS, ROUTINE W REFLEX MICROSCOPIC - Abnormal; Notable for the following components:   Color, Urine YELLOW (*)    APPearance CLEAR (*)    All other components within normal limits     EKG     RADIOLOGY X-ray of bilateral hips and pelvis MRI lumbar spine    PROCEDURES:   Procedures  Critical Care:  no Chief Complaint  Patient presents with   Fall  MEDICATIONS ORDERED IN ED: Medications  fentaNYL  (SUBLIMAZE ) injection 50 mcg (50 mcg Intramuscular Given 01/15/24 1212)  ondansetron  (ZOFRAN -ODT) disintegrating tablet 4 mg (4 mg Oral Given 01/15/24 1214)  dexamethasone  (DECADRON ) injection 10 mg (10 mg Intravenous Given 01/15/24 1628)  morphine  (PF) 4 MG/ML injection 4 mg (4 mg Intravenous Given 01/15/24 1628)     IMPRESSION / MDM / ASSESSMENT AND PLAN / ED COURSE  I reviewed the triage vital signs and the nursing notes.                              Differential diagnosis includes, but is not limited to, inadequate pain control, fracture, contusion, nerve impingement, decreased mobility secondary to fall  Patient's presentation is most consistent with acute illness / injury with system symptoms.    Medications given: Fentanyl  50 mcg IM, Zofran  4 mg ODT, Decadron  10 mg IV, morphine  4 mg IV  MRI lumbar spine shows spondylitic changes and some edema that they think is more to do with endplate degenerative changes.  I did independently review and interpret the radiologist reading  Labs CBC metabolic panel  CBC and metabolic panel  appear to be in patient's normal trend, no acute abnormality noted  UA still pending  UA reassuring  Attempted to have the patient stand again after second dose of pain medication, patient unable to sit up without severe pain, cannot stand.  Will admit to hospital  Consult hospitalist for admission for pain control and decreased mobility  Spoke with Dr. Laurita, he will be admitting the patient.,  She is in stable condition at this time.  No red flags to further workup in the ED.      FINAL CLINICAL IMPRESSION(S) / ED DIAGNOSES   Final diagnoses:  Acute midline low back pain with sciatica, sciatica laterality unspecified  Inadequate pain control  Decreased functional mobility     Rx / DC Orders   ED Discharge Orders     None        Note:  This document was prepared using Dragon voice recognition software and may include unintentional dictation errors.    Gasper Devere ORN, PA-C 01/15/24 1752    Dorothyann Drivers, MD 01/15/24 [phone]

## 2024-01-15 NOTE — ED Triage Notes (Signed)
 Pt to ED for c/o pain from fall that occurred on Wednesday. Pt states she was seen here and discharged with pain medication, states it is not working and states she has pain and numbness in both legs.

## 2024-01-15 NOTE — H&P (Signed)
 History and Physical    Sarah Potts FMW:[id number] DOB: [birth year] DOA: 01/15/2024  PCP: Stanton Lynwood FALCON, MD (Confirm with patient/family/NH records and if not entered, this has to be entered at [facility] point of entry) Patient coming from: Home  I have personally briefly reviewed patient's old medical records in [facility]  Chief Complaint: Back and leg pain, fall  HPI: Sarah Potts is a 89 y.o. female with medical history significant of lumbar stenosis, sciatica, HTN, PAF on Xarelto , CAD status post stenting, chronic HFpEF, IIDM, CKD stage IIIb, anxiety/depression, presented with uncontrolled back pain and right leg pain.  Patient has a chronic ambulation impairment using roller walker to ambulate, chronically she has known lumbar stenosis and sciatica mainly on the right side of lower back and right leg, for which she has been following with neurosurgery and getting steroid injection periodically and last time she received steroid injection was about 1 month ago.  After the last time she received injection her back pain and leg pain improved briefly then last week she started to have severe back pain shooting down to the back of the right leg and right calf worsening with ambulation, and she fell 3 days ago and started to have worsening of right sided back pain/leg pain.  She came to ED the same day and CT scan was negative for fracture or dislocation patient sent home.  Last few days however patient has had persistent severe back pain and leg pain and had to come back to ER today.  She denied any trouble with urinating or bowel movement.  ED Course: Afebrile nontachycardic blood pressure 180/60 with saturation 100% on room air.  MRI of lumbar spine showed moderate central canal stenosis in L3-4, L2-L3 and foraminal stenosis in L5-S1, L4-L5 which appears to be new favored to represent degenerative endplate findings.  Review of Systems: As per HPI otherwise 14 point review of systems  negative.    Past Medical History:  Diagnosis Date   Anal cancer (HCC)    Anemia    Asthma    CHF (congestive heart failure) (HCC)    Diabetes mellitus    Hyperlipidemia    Hypertension     Past Surgical History:  Procedure Laterality Date   BLADDER SURGERY     CARDIAC CATHETERIZATION     COLOSTOMY     CORONARY ANGIOPLASTY WITH STENT PLACEMENT  03/25/2018   Stent to the LCX  2.50 mm x 16mm  [employer] REF [id number]   heart stent     LEFT HEART CATH AND CORONARY ANGIOGRAPHY N/A 12/23/2021   Procedure: LEFT HEART CATH AND CORONARY ANGIOGRAPHY;  Surgeon: Darron Deatrice LABOR, MD;  Location: [facility] INVASIVE CV LAB;  Service: Cardiovascular;  Laterality: N/A;   TONSILLECTOMY     uterus surgery     removed     reports that she has never smoked. She has never used smokeless tobacco. She reports that she does not drink alcohol and does not use drugs.  Allergies  Allergen Reactions   Levofloxacin Nausea Only    Patient reports that she thinks that she could take this medication.  Patient reports that she thinks that she could take this medication.     Patient reports that she thinks that she could take this medication.   Niacin Other (See Comments)    Hot, Flushed  Pt states she get real red and hot.    Hot, Flushed   Niacin And Related Other (See  Comments)    Pt states she get real red and hot.    Family History  Problem Relation Age of Onset   Cancer Mother    Breast cancer Mother    Heart disease Father    Heart attack Father    Cancer Sister    Heart disease Sister    Cervical cancer Sister    Heart disease Brother      Prior to Admission medications   Medication Sig Start Date End Date Taking? Authorizing Provider  acetaminophen  (ACETAMINOPHEN  8 HOUR) 650 MG CR tablet Take 1 tablet (650 mg total) by mouth every 8 (eight) hours as needed for pain. 01/13/24   Janit Kast, PA-C  albuterol  (VENTOLIN  HFA) 108 (90 Base) MCG/ACT  inhaler Inhale 1 puff into the lungs every 4 (four) hours as needed. 11/29/22   [provider]  amiodarone  (PACERONE ) 200 MG tablet TAKE 1 TABLET BY MOUTH EVERY DAY 09/11/23   Gollan, Timothy J, MD  amLODipine  (NORVASC ) 2.5 MG tablet Take 2.5 mg by mouth daily. 07/25/23   [provider]  atorvastatin  (LIPITOR) 40 MG tablet TAKE 1 TABLET (40 MG TOTAL) BY MOUTH IN THE MORNING. FOR CHOLESTEROL. 12/07/23   Gollan, Timothy J, MD  Cholecalciferol  25 MCG (1000 UT) tablet Take 4,000 Units by mouth daily. Pt takes 3 daily    [provider]  fexofenadine (ALLEGRA) 60 MG tablet Take 60 mg by mouth daily. 12/18/22   [provider]  gabapentin (NEURONTIN) 300 MG capsule Take 1 capsule by mouth 2 (two) times daily. 09/10/23 09/09/24  [provider]  levothyroxine  (SYNTHROID ) 25 MCG tablet Take 25 mcg by mouth daily.    [provider]  lidocaine  (LIDODERM ) 5 % Place 1 patch onto the skin every 12 (twelve) hours for 5 days. Remove & Discard patch within 12 hours or as directed by MD 01/13/24 01/18/24  Janit Kast, PA-C  losartan  (COZAAR ) 50 MG tablet Take 1 tablet (50 mg total) by mouth daily. 07/10/23   Gollan, Timothy J, MD  meclizine  (ANTIVERT ) 25 MG tablet Take 1 tablet (25 mg total) by mouth every 6 (six) hours as needed. 07/10/23   Gollan, Timothy J, MD  metoprolol  succinate (TOPROL -XL) 25 MG 24 hr tablet Take 1 tablet (25 mg total) by mouth daily. Take with or immediately following a meal. 07/10/23   Gollan, Timothy J, MD  montelukast  (SINGULAIR ) 10 MG tablet Take 10 mg by mouth at bedtime.      [provider]  nitroGLYCERIN  (NITROSTAT ) 0.4 MG SL tablet Place 1 tablet (0.4 mg total) under the tongue every 5 (five) minutes as needed for chest pain. 12/24/21   Patel, Sona, MD  Omega-3 Fatty Acids (FISH OIL) 1000 MG CAPS Take 1,000 mg by mouth in the morning and at bedtime.    [provider]  omeprazole (PRILOSEC) 40 MG capsule Take 40 mg by  mouth daily.      [provider]  sertraline  (ZOLOFT ) 50 MG tablet Take 50 mg by mouth daily. 08/12/22   [provider]  TRULICITY 1.5 MG/0.5ML SOPN Inject into the skin once a week. 12/16/20   [provider]  XARELTO  15 MG TABS tablet TAKE 1 TABLET (15 MG TOTAL) BY MOUTH DAILY. 06/01/23   Gollan, Timothy J, MD    Physical Exam: Vitals:   01/15/24 1055 01/15/24 1056 01/15/24 1103  BP: (!) 185/68    Pulse: 69    Resp: 18    Temp: 98.3 F (36.8  C)    TempSrc: Oral    SpO2: 100%    Weight:  82.6 kg 82.6 kg  Height:  5' 2 (1.575 m) 5' 2 (1.575 m)    Constitutional: NAD, calm, comfortable Vitals:   01/15/24 1055 01/15/24 1056 01/15/24 1103  BP: (!) 185/68    Pulse: 69    Resp: 18    Temp: 98.3 F (36.8 C)    TempSrc: Oral    SpO2: 100%    Weight:  82.6 kg 82.6 kg  Height:  5' 2 (1.575 m) 5' 2 (1.575 m)   Eyes: PERRL, lids and conjunctivae normal ENMT: Mucous membranes are moist. Posterior pharynx clear of any exudate or lesions.Normal dentition.  Neck: normal, supple, no masses, no thyromegaly Respiratory: clear to auscultation bilaterally, no wheezing, no crackles. Normal respiratory effort. No accessory muscle use.  Cardiovascular: Regular rate and rhythm, no murmurs / rubs / gallops. No extremity edema. 2+ pedal pulses. No carotid bruits.  Abdomen: no tenderness, no masses palpated. No hepatosplenomegaly. Bowel sounds positive.  Musculoskeletal: no clubbing / cyanosis. No joint deformity upper and lower extremities. Good ROM, no contractures. Normal muscle tone.  Positive straight leg test on right side with pain trigger with flexion about 30 degree of leg elevation Skin: no rashes, lesions, ulcers. No induration Neurologic: CN 2-12 grossly intact. Sensation intact, DTR normal. Strength 5/5 in all 4.  Psychiatric: Normal judgment and insight. Alert and oriented x 3. Normal mood.     Labs on Admission: I have personally reviewed following labs  and imaging studies  CBC: Recent Labs  Lab 01/15/24 1651  WBC 6.6  NEUTROABS 4.9  HGB 10.6*  HCT 32.6*  MCV 96.2  PLT 211   Basic Metabolic Panel: Recent Labs  Lab 01/15/24 1651  NA 136  K 4.1  CL 105  CO2 24  GLUCOSE 128*  BUN 14  CREATININE 1.23*  CALCIUM  8.8*   GFR: Estimated Creatinine Clearance: 30.9 mL/min (A) (by C-G formula based on SCr of 1.23 mg/dL (H)). Liver Function Tests: No results for input(s): AST, ALT, ALKPHOS, BILITOT, PROT, ALBUMIN in the last 168 hours. No results for input(s): LIPASE, AMYLASE in the last 168 hours. No results for input(s): AMMONIA in the last 168 hours. Coagulation Profile: No results for input(s): INR, PROTIME in the last 168 hours. Cardiac Enzymes: No results for input(s): CKTOTAL, CKMB, CKMBINDEX, TROPONINI in the last 168 hours. BNP (last 3 results) No results for input(s): PROBNP in the last 8760 hours. HbA1C: No results for input(s): HGBA1C in the last 72 hours. CBG: No results for input(s): GLUCAP in the last 168 hours. Lipid Profile: No results for input(s): CHOL, HDL, LDLCALC, TRIG, CHOLHDL, LDLDIRECT in the last 72 hours. Thyroid  Function Tests: No results for input(s): TSH, T4TOTAL, FREET4, T3FREE, THYROIDAB in the last 72 hours. Anemia Panel: No results for input(s): VITAMINB12, FOLATE, FERRITIN, TIBC, IRON , RETICCTPCT in the last 72 hours. Urine analysis:    Component Value Date/Time   COLORURINE YELLOW (A) 01/15/2024 1739   APPEARANCEUR CLEAR (A) 01/15/2024 1739   LABSPEC 1.008 01/15/2024 1739   PHURINE 5.0 01/15/2024 1739   GLUCOSEU NEGATIVE 01/15/2024 1739   HGBUR NEGATIVE 01/15/2024 1739   BILIRUBINUR NEGATIVE 01/15/2024 1739   KETONESUR NEGATIVE 01/15/2024 1739   PROTEINUR NEGATIVE 01/15/2024 1739   NITRITE NEGATIVE 01/15/2024 1739   LEUKOCYTESUR NEGATIVE 01/15/2024 1739    Radiological Exams on Admission: MR LUMBAR SPINE WO  CONTRAST Result Date: 01/15/2024 EXAM: MRI LUMBAR SPINE 01/15/2024 03:23:00  PM TECHNIQUE: Multiplanar multisequence MRI of the lumbar spine was performed without the administration of intravenous contrast. COMPARISON: None available. CLINICAL HISTORY: Lumbar radiculopathy, trauma from fall, pain and numbness in both legs. FINDINGS: BONES AND ALIGNMENT: Normal alignment except for 4 mm degenerative anterolisthesis at L3-L4 and mild dextroconvex lumbar scoliosis. Chronic 50 percent anterior compression fracture at L3, not changed from previous. Small subcortical bands of edema anteriorly along the inferior endplate of L4 and posteriorly along the superior endplate of L5 are new compared to the prior exam. Given the subtlety of the findings on inversion recovery weighted images, I favored degenerative endplate findings over early/subtle endplate fractures, although the pattern is unusual. Adjacent intervertebral disc appears normal. SPINAL CORD: The conus terminates normally at the T12-L1 level. SOFT TISSUES: No paraspinal mass. Nonspecific mild presacral edema. T12-L1: Unremarkable. L1-L2: No impingement. Disc bulge and small central disc protrusion. L2-L3: Moderate left eccentric central stenosis and moderate left foraminal stenosis due to left paracentral and lateral recess disc protrusion, intervertebral spurring, facet arthropathy. Similar to prior. L3-L4: Moderate central stenosis due to disc bulge, disc uncovering, facet arthropathy, and ligamentum flavum redundancy. Mildly worsened from prior. L4-L5: Moderate right and borderline left foraminal stenosis due to disc bulge, intervertebral spurring, right foraminal disc protrusion, and facet arthropathy. Similar to prior. L5-S1: Moderate bilateral foraminal stenosis due to disc bulge, right foraminal disc protrusion, and facet arthropathy. Similar to prior. IMPRESSION: 1. Moderate central stenosis at L3-4, mildly worsened from prior. 2. Moderate left eccentric  central stenosis and moderate left foraminal stenosis at L2-3, similar to prior. 3. Moderate bilateral foraminal stenosis at L5-S1, similar to prior. 4. Moderate right foraminal stenosis at L4-5, similar to prior. 5. Small subcortical bands of edema at L4 and L5 endplates, new compared to prior exam, favored to represent degenerative endplate findings. 6. Chronic 50% anterior compression fracture at L3, unchanged. Electronically signed by: Ryan Salvage MD 01/15/2024 03:40 PM EDT RP Workstation: [id number]   DG HIPS BILAT WITH PELVIS MIN 5 VIEWS Result Date: 01/15/2024 EXAM: 5 OR MORE VIEW(S) XRAY OF THE BILATERAL HIP 01/15/2024 12:52:39 PM COMPARISON: None available. CLINICAL HISTORY: Fall. FINDINGS: BONES AND JOINTS: No acute fracture or focal osseous lesion. The hip joint is maintained. Mild spurring of both femoral heads and acetabulum with suspected chondrocalcinosis along the acetabular labrum bilaterally. Degenerative spurring of the pubis. Bony demineralization. SOFT TISSUES: Atheromatous vascular calcifications. LUMBAR SPINE: Interbody spurring at L4-L5. IMPRESSION: 1. No acute abnormalities. 2. Mild degenerative spurring of both femoral heads and acetabula with suspected bilateral acetabular labral chondrocalcinosis. 3. Degenerative spurring of the pubic symphysis. 4. Interbody degenerative spurring at L4-5. Electronically signed by: Ryan Salvage MD 01/15/2024 01:16 PM EDT RP Workstation: [id number]    EKG: None  Assessment/Plan Principal Problem:   Back pain Active Problems:   Fall   Sciatica associated with disorder of lumbar spine  (please populate well all problems here in Problem List. (For example, if patient is on BP meds at home and you resume or decide to hold them, it is a problem that needs to be her. Same for CAD, COPD, HLD and so on)  Acute on chronic ambulation impairment Worsening of right leg sciatica Chronic lumbar stenosis on multiple levels -No focal  neurological deficit - MRI of lumbar spine showed mostly chronic changes and some worsening L4-L5 endplate degeneration probably responsible for worsening of neurological pain.  Discussed with patient and family regarding pain control regimen: For today we will use IV pain meds alternated with p.o.  oxycodone , hopefully can wean patient off IV pain meds within 24 hours so that she can go home.  Patient also agreed with short course of p.o. steroid.  Will also send PT evaluation. -Outpatient follow-up with neurosurgery for steroid injections.  Patient understand that she is not a candidate for any surgical intervention given her age and comorbidities. - Continue gabapentin -  Chronic HFpEF HTN - Euvolemic - Continue current regimen amlodipine  losartan  and metoprolol   PAF - In sinus rhythm - Continue amiodarone , continue Xarelto   IIDM - As patient will be on steroid, will put her on SSI  GERD - Continue PPI  CKD stage IIIb - Euvolemic creatinine level stable -Xarelto  as renal dosed  DVT prophylaxis: Xarelto  Code Status: DNR Family Communication: Son at bedside Disposition Plan: Expect less than 2 midnight hospital stay Consults called: None Admission status: MedSurg observation   Cort ONEIDA Mana MD [facility] Pager [phone]  01/15/2024, 6:07 PM

## 2024-01-16 DIAGNOSIS — M5386 Other specified dorsopathies, lumbar region: Secondary | ICD-10-CM

## 2024-01-16 DIAGNOSIS — W19XXXA Unspecified fall, initial encounter: Secondary | ICD-10-CM | POA: Diagnosis not present

## 2024-01-16 DIAGNOSIS — M5441 Lumbago with sciatica, right side: Secondary | ICD-10-CM | POA: Diagnosis not present

## 2024-01-16 LAB — COMPREHENSIVE METABOLIC PANEL WITH GFR
ALT: 24 U/L (ref 0–44)
AST: 49 U/L — ABNORMAL HIGH (ref 15–41)
Albumin: 2.7 g/dL — ABNORMAL LOW (ref 3.5–5.0)
Alkaline Phosphatase: 58 U/L (ref 38–126)
Anion gap: 12 (ref 5–15)
BUN: 21 mg/dL (ref 8–23)
CO2: 22 mmol/L (ref 22–32)
Calcium: 8.6 mg/dL — ABNORMAL LOW (ref 8.9–10.3)
Chloride: 97 mmol/L — ABNORMAL LOW (ref 98–111)
Creatinine, Ser: 1.28 mg/dL — ABNORMAL HIGH (ref 0.44–1.00)
GFR, Estimated: 40 mL/min — ABNORMAL LOW (ref >60)
Glucose, Bld: 182 mg/dL — ABNORMAL HIGH (ref 70–99)
Potassium: 5 mmol/L (ref 3.5–5.1)
Sodium: 131 mmol/L — ABNORMAL LOW (ref 135–145)
Total Bilirubin: 1.5 mg/dL — ABNORMAL HIGH (ref 0.0–1.2)
Total Protein: 6.5 g/dL (ref 6.5–8.1)

## 2024-01-16 LAB — CBC WITH DIFFERENTIAL/PLATELET
Abs Immature Granulocytes: 0.07 K/uL (ref 0.00–0.07)
Basophils Absolute: 0 K/uL (ref 0.0–0.1)
Basophils Relative: 0 %
Eosinophils Absolute: 0 K/uL (ref 0.0–0.5)
Eosinophils Relative: 0 %
HCT: 30.4 % — ABNORMAL LOW (ref 36.0–46.0)
Hemoglobin: 9.9 g/dL — ABNORMAL LOW (ref 12.0–15.0)
Immature Granulocytes: 1 %
Lymphocytes Relative: 9 %
Lymphs Abs: 0.7 K/uL (ref 0.7–4.0)
MCH: 31.6 pg (ref 26.0–34.0)
MCHC: 32.6 g/dL (ref 30.0–36.0)
MCV: 97.1 fL (ref 80.0–100.0)
Monocytes Absolute: 0.6 K/uL (ref 0.1–1.0)
Monocytes Relative: 8 %
Neutro Abs: 6.3 K/uL (ref 1.7–7.7)
Neutrophils Relative %: 82 %
Platelets: 245 K/uL (ref 150–400)
RBC: 3.13 MIL/uL — ABNORMAL LOW (ref 3.87–5.11)
RDW: 15.7 % — ABNORMAL HIGH (ref 11.5–15.5)
WBC: 7.7 K/uL (ref 4.0–10.5)
nRBC: 0 % (ref 0.0–0.2)

## 2024-01-16 LAB — GLUCOSE, CAPILLARY
Glucose-Capillary: 173 mg/dL — ABNORMAL HIGH (ref 70–99)
Glucose-Capillary: 192 mg/dL — ABNORMAL HIGH (ref 70–99)
Glucose-Capillary: 266 mg/dL — ABNORMAL HIGH (ref 70–99)
Glucose-Capillary: 226 mg/dL — ABNORMAL HIGH (ref 70–99)

## 2024-01-16 NOTE — Progress Notes (Signed)
 PROGRESS NOTE    Sarah Potts  FMW:[id number] DOB: 31-Aug-1932 DOA: 01/15/2024 PCP: Stanton Lynwood FALCON, MD  Chief Complaint  Patient presents with   [facility] Course:  Sarah Potts is a 89 y.o. female with medical history significant of lumbar stenosis, sciatica, HTN, PAF on Xarelto , CAD status post stenting, chronic HFpEF, IIDM, CKD stage IIIb, anxiety/depression, presented with uncontrolled back pain and right leg pain. Patient has chronic pain causing ambulation impairment, she uses a rolling walker typically.  She receives periodic steroid injections with the last injection 1 month prior to arrival.  She presented to the ED for acute worsening and CT scan was negative for fracture or dislocation so she was discharged home.  She is a difficulty controlling her pain so she return to the ED on 10/3.  She was admitted for pain management and physical therapy.  Subjective: Patient reports her pain has been persistent today.  Not yet up with physical therapy.  She does endorse some improvement with the pain medications but reports they do not provide consistent relief throughout the day.  He has required inpatient rehab in the past and is amendable to this if recommended on this admission.   Objective: Vitals:   01/15/24 2012 01/16/24 0345 01/16/24 0532 01/16/24 0719  BP: (!) 158/62 (!) 142/59 (!) 112/59 (!) 137/57  Pulse: 71 67 65 63  Resp: 20 18 18 16   Temp: 98.8 F (37.1 C) 98.7 F (37.1 C) 98.6 F (37 C) 98.2 F (36.8 C)  TempSrc: Oral Oral    SpO2: 97% 93% 95% 94%  Weight:      Height:        Intake/Output Summary (Last 24 hours) at 01/16/2024 1322 Last data filed at 01/16/2024 0500 Gross per 24 hour  Intake 240 ml  Output 350 ml  Net -110 ml   Filed Weights   01/15/24 1056 01/15/24 1103  Weight: 82.6 kg 82.6 kg    Examination: General exam: Appears calm and comfortable, NAD  Respiratory system: No work of breathing, symmetric chest wall  expansion Cardiovascular system: S1 & S2 heard, RRR.  Gastrointestinal system: Abdomen is nondistended, soft and nontender.  Neuro: Alert and oriented.  Psychiatry: Demonstrates appropriate judgement and insight. Mood & affect appropriate for situation.   Assessment & Plan:  Principal Problem:   Back pain Active Problems:   Fall   Sciatica associated with disorder of lumbar spine   Acute on chronic back pain Ambulation impairment Worsening right leg sciatica Chronic lumbar stenosis multiple levels - No focal neurologic deficit - MRI lumbar spine: Mostly chronic changes, worsening L4-L5 endplate degeneration - Continue with current pain regimen - PT/OT, may benefit from rehab course versus home health - Continue outpatient follow-up with neurosurgery for steroid injections - Patient is not a candidate for surgical intervention given age and comorbidities  Chronic heart failure preserved EF Hypertension - Currently euvolemic - Continue home meds, titrate as needed  Paroxysmal atrial fibrillation - On Amio and Xarelto .  Continue.  Diabetes - On sliding scale insulin  for now given steroid therapy.  Anticipate worsening hyperglycemia.  Monitor closely and titrate as needed Hemoglobin A1c 6.7%  GERD - Continue PPI  CKD stage IIIb - Creatinine appears to be near baseline at this time - Continue with current meds  Hyponatremia - Monitor closely  Elevated LFTs - AST 49, total bili 1.5.  No clear etiology for this.  Repeat CMP in a.m.  DVT prophylaxis: Renally dosed Xarelto   Code Status: Limited: Do not attempt resuscitation (DNR) -DNR-LIMITED -Do Not Intubate/DNI  Disposition:  Observation for pain control and PT  Consultants:    Procedures:    Antimicrobials:  Anti-infectives (From admission, onward)    None       Data Reviewed: I have personally reviewed following labs and imaging studies CBC: Recent Labs  Lab 01/15/24 1651 01/16/24 1015  WBC 6.6  7.7  NEUTROABS 4.9 6.3  HGB 10.6* 9.9*  HCT 32.6* 30.4*  MCV 96.2 97.1  PLT 211 245   Basic Metabolic Panel: Recent Labs  Lab 01/15/24 1651 01/16/24 1015  NA 136 131*  K 4.1 5.0  CL 105 97*  CO2 24 22  GLUCOSE 128* 182*  BUN 14 21  CREATININE 1.23* 1.28*  CALCIUM  8.8* 8.6*   GFR: Estimated Creatinine Clearance: 29.7 mL/min (A) (by C-G formula based on SCr of 1.28 mg/dL (H)). Liver Function Tests: Recent Labs  Lab 01/16/24 1015  AST 49*  ALT 24  ALKPHOS 58  BILITOT 1.5*  PROT 6.5  ALBUMIN 2.7*   CBG: Recent Labs  Lab 01/15/24 2036 01/16/24 0727 01/16/24 1135  GLUCAP 285* 173* 192*    No results found for this or any previous visit (from the past 240 hours).   Radiology Studies: MR LUMBAR SPINE WO CONTRAST Result Date: 01/15/2024 EXAM: MRI LUMBAR SPINE 01/15/2024 03:23:00 PM TECHNIQUE: Multiplanar multisequence MRI of the lumbar spine was performed without the administration of intravenous contrast. COMPARISON: None available. CLINICAL HISTORY: Lumbar radiculopathy, trauma from fall, pain and numbness in both legs. FINDINGS: BONES AND ALIGNMENT: Normal alignment except for 4 mm degenerative anterolisthesis at L3-L4 and mild dextroconvex lumbar scoliosis. Chronic 50 percent anterior compression fracture at L3, not changed from previous. Small subcortical bands of edema anteriorly along the inferior endplate of L4 and posteriorly along the superior endplate of L5 are new compared to the prior exam. Given the subtlety of the findings on inversion recovery weighted images, I favored degenerative endplate findings over early/subtle endplate fractures, although the pattern is unusual. Adjacent intervertebral disc appears normal. SPINAL CORD: The conus terminates normally at the T12-L1 level. SOFT TISSUES: No paraspinal mass. Nonspecific mild presacral edema. T12-L1: Unremarkable. L1-L2: No impingement. Disc bulge and small central disc protrusion. L2-L3: Moderate left eccentric  central stenosis and moderate left foraminal stenosis due to left paracentral and lateral recess disc protrusion, intervertebral spurring, facet arthropathy. Similar to prior. L3-L4: Moderate central stenosis due to disc bulge, disc uncovering, facet arthropathy, and ligamentum flavum redundancy. Mildly worsened from prior. L4-L5: Moderate right and borderline left foraminal stenosis due to disc bulge, intervertebral spurring, right foraminal disc protrusion, and facet arthropathy. Similar to prior. L5-S1: Moderate bilateral foraminal stenosis due to disc bulge, right foraminal disc protrusion, and facet arthropathy. Similar to prior. IMPRESSION: 1. Moderate central stenosis at L3-4, mildly worsened from prior. 2. Moderate left eccentric central stenosis and moderate left foraminal stenosis at L2-3, similar to prior. 3. Moderate bilateral foraminal stenosis at L5-S1, similar to prior. 4. Moderate right foraminal stenosis at L4-5, similar to prior. 5. Small subcortical bands of edema at L4 and L5 endplates, new compared to prior exam, favored to represent degenerative endplate findings. 6. Chronic 50% anterior compression fracture at L3, unchanged. Electronically signed by: Ryan Salvage MD 01/15/2024 03:40 PM EDT RP Workstation: [id number]   DG HIPS BILAT WITH PELVIS MIN 5 VIEWS Result Date: 01/15/2024 EXAM: 5 OR MORE VIEW(S) XRAY OF THE BILATERAL HIP 01/15/2024 12:52:39 PM COMPARISON: None available. CLINICAL HISTORY:  Fall. FINDINGS: BONES AND JOINTS: No acute fracture or focal osseous lesion. The hip joint is maintained. Mild spurring of both femoral heads and acetabulum with suspected chondrocalcinosis along the acetabular labrum bilaterally. Degenerative spurring of the pubis. Bony demineralization. SOFT TISSUES: Atheromatous vascular calcifications. LUMBAR SPINE: Interbody spurring at L4-L5. IMPRESSION: 1. No acute abnormalities. 2. Mild degenerative spurring of both femoral heads and acetabula with  suspected bilateral acetabular labral chondrocalcinosis. 3. Degenerative spurring of the pubic symphysis. 4. Interbody degenerative spurring at L4-5. Electronically signed by: Ryan Salvage MD 01/15/2024 01:16 PM EDT RP Workstation: [id number]    Scheduled Meds:  amiodarone   200 mg Oral Daily   amLODipine   2.5 mg Oral Daily   atorvastatin   40 mg Oral Daily   capsaicin   Topical BID   insulin  aspart  0-9 Units Subcutaneous TID WC   levothyroxine   25 mcg Oral Daily   loratadine  10 mg Oral Daily   losartan   50 mg Oral Daily   metoprolol  succinate  25 mg Oral Daily   montelukast   10 mg Oral QHS   pantoprazole   40 mg Oral Daily   predniSONE  40 mg Oral Q breakfast   Rivaroxaban   15 mg Oral Q supper   sertraline   50 mg Oral Daily   Continuous Infusions:   LOS: 0 days  MDM: Patient is high risk for one or more organ failure.  They necessitate ongoing hospitalization for continued IV therapies and subsequent lab monitoring. Total time spent interpreting labs and vitals, reviewing the medical record, coordinating care amongst consultants and care team members, directly assessing and discussing care with the patient and/or family: 55 min  Isaiyah Feldhaus, DO [facility]  To contact the attending physician between 7A-7P please use Epic Chat. To contact the covering physician during after hours 7P-7A, please review Amion.  01/16/2024, 1:22 PM   *This document has been created with the assistance of dictation software. Please excuse typographical errors. *

## 2024-01-16 NOTE — Evaluation (Addendum)
 Physical Therapy Evaluation Patient Details Name: Sarah Potts MRN: [id number] DOB: May 20, 1932 Today's Date: 01/16/2024  History of Present Illness  Sarah Potts is a 89 y.o. female with medical history significant of lumbar stenosis, sciatica, HTN, PAF on Xarelto , CAD status post stenting, chronic HFpEF, IIDM, CKD stage IIIb, anxiety/depression, presented with uncontrolled back pain and right leg pain. She fell 3 days prior to admission after having worsening of her pain. She came to the ED at that point where there was a negative CT scan for fracture or dislocation and she was sent home. She returned because of continued pain in the r leg and low back and was admitted this time. MRI of lumbar spine showed mostly chronic changes and some worsening L4-L5 endplate degeneration   Clinical Impression  Patient received asleep in bed but woke up easily and was agreeable to PT evaluation. She was alert and oriented and able to provide detailed history. She lives alone in an apartment with elevator entry with family available to help PRN. She ambulates with RW short community distances and had recent fall that lead to this hospitalization. She is largely independent with ADLs and IADLs including managing her ostomy bag independently (which requires prolonged standing without UE support). Her son helps with grocery shopping sometimes. She has a lift chair and her bed is elevated from the ground. Her bed is also adjustable so the head of bed can be elevated. Upon PT evaluation, she completed bed mobility without physical assist using bed-rail and elevated bed. She was taught log roll technique for comfort.  She performed sit to stand with supervision and CGA from slightly elevated surface and stand to sit with supervision to lower surface, both using RW for UE support. She ambulated ~ 95 feet with RW and supervision with slow speed, short step length, and slightly stooped posture. She reported pain especially  with transitions and prolonged standing that reduced her activity tolerance and ability to stand without UE support. She was unable to reach her feet to doff her socks. Patient demonstrates a large decrease in functional independence and she needs a higher level of care than baseline to manage ADLs and mobility. She would benefit from continued PT <3 hours a day to help restore her function and allow her to return home with the ability to safely manage ADLs and mobility while living alone.  Patient would benefit from skilled physical therapy to address impairments and functional limitations (see PT Problem List below) to work towards stated goals and return to PLOF or maximal functional independence.        If plan is discharge home, recommend the following: A little help with walking and/or transfers;A little help with bathing/dressing/bathroom;Assistance with cooking/housework;Assist for transportation;Help with stairs or ramp for entrance   Can travel by private vehicle   Yes    Equipment Recommendations BSC/3in1  Recommendations for Other Services       Functional Status Assessment Patient has had a recent decline in their functional status and demonstrates the ability to make significant improvements in function in a reasonable and predictable amount of time.     Precautions / Restrictions Precautions Precautions: Fall Restrictions Weight Bearing Restrictions Per Provider Order: No      Mobility  Bed Mobility Overal bed mobility: Needs Assistance Bed Mobility: Supine to Sit, Sit to Supine     Supine to sit: Used rails, HOB elevated, Supervision Sit to supine: Supervision   General bed mobility comments: cuing for log roll, pt  using railings to help roll and has difficulty pushing up to sit. States she has an adjustable head of bed at home.    Transfers Overall transfer level: Needs assistance Equipment used: Rolling walker (2 wheels) Transfers: Sit to/from Stand Sit to  Stand: From elevated surface, Supervision, Contact guard assist           General transfer comment: Completed sit <> stand at edge of bed to RW. Bed slightly elevated when she went to stand and at lowest setting when going to sit. She states she has a lift chair and elevated bed at home.    Ambulation/Gait Ambulation/Gait assistance: Supervision Gait Distance (Feet): 95 Feet Assistive device: Rolling walker (2 wheels)   Gait velocity: decreased     General Gait Details: Ambluated with RW with short step length and slightly stooped posture.  Stairs            Wheelchair Mobility     Tilt Bed    Modified Rankin (Stroke Patients Only)       Balance Overall balance assessment: Needs assistance Sitting-balance support: Bilateral upper extremity supported Sitting balance-Leahy Scale: Fair     Standing balance support: Reliant on assistive device for balance, Bilateral upper extremity supported, During functional activity Standing balance-Leahy Scale: Fair                               Pertinent Vitals/Pain Pain Assessment Pain Assessment: 0-10 Pain Score: 8  Pain Location: low back, B buttocks and legs R > L feel numb and painful Pain Descriptors / Indicators: Discomfort Pain Intervention(s): Limited activity within patient's tolerance, Monitored during session, Repositioned    Home Living Family/patient expects to be discharged to:: Private residence Living Arrangements: Alone Available Help at Discharge: Available PRN/intermittently;Family Type of Home: Apartment Home Access: Elevator       Home Layout: One level Home Equipment: Rollator (4 wheels);Cane - single point;Tub bench;Grab bars - toilet;Grab bars - tub/shower;Hand held shower head;Rolling Walker (2 wheels);Lift chair Additional Comments: adjustable bed    Prior Function Prior Level of Function : Needs assist             Mobility Comments: Ambulatory with RW for household and  limited community distances; + driving; fall leading to this hospitalization is her only fall this year ADLs Comments: Pt largely IND in B/IADL. Son assists her with grocery shopping. She manages a colostomy bag independently and must stand while doing this.     Extremity/Trunk Assessment   Upper Extremity Assessment Upper Extremity Assessment: Generalized weakness    Lower Extremity Assessment Lower Extremity Assessment: Generalized weakness    Cervical / Trunk Assessment Cervical / Trunk Assessment: Normal  Communication   Communication Communication: Impaired Factors Affecting Communication: Hearing impaired    Cognition Arousal: Alert Behavior During Therapy: WFL for tasks assessed/performed   PT - Cognitive impairments: No apparent impairments                         Following commands: Intact (pt very directive towards PT and refuses cues at times)       Cueing Cueing Techniques: Verbal cues     General Comments      Exercises Other Exercises Other Exercises: pt educated on log roll technique for bed mobility, role of PT In acute care setting, discharge reccomendations, safe mobility.   Assessment/Plan    PT Assessment Patient needs continued PT services  PT Problem List Decreased strength;Decreased mobility;Decreased range of motion;Decreased activity tolerance;Decreased balance;Pain       PT Treatment Interventions DME instruction;Therapeutic exercise;Gait training;Balance training;Neuromuscular re-education;Functional mobility training    PT Goals (Current goals can be found in the Care Plan section)  Acute Rehab PT Goals Patient Stated Goal: to go to rehab to get stronger before going home so she can independently manage her ostomy PT Goal Formulation: With patient Time For Goal Achievement: 01/30/24 Potential to Achieve Goals: Good    Frequency Min 3X/week     Co-evaluation               AM-PAC PT 6 Clicks Mobility  Outcome  Measure Help needed turning from your back to your side while in a flat bed without using bedrails?: A Little Help needed moving from lying on your back to sitting on the side of a flat bed without using bedrails?: A Little Help needed moving to and from a bed to a chair (including a wheelchair)?: A Little Help needed standing up from a chair using your arms (e.g., wheelchair or bedside chair)?: A Little Help needed to walk in hospital room?: A Little Help needed climbing 3-5 steps with a railing? : A Lot 6 Click Score: 17    End of Session Equipment Utilized During Treatment: Gait belt Activity Tolerance: Patient tolerated treatment well;Patient limited by pain Patient left: in bed;with call bell/phone within reach;with bed alarm set Nurse Communication: Mobility status PT Visit Diagnosis: Other abnormalities of gait and mobility (R26.89);Difficulty in walking, not elsewhere classified (R26.2);History of falling (Z91.81);Muscle weakness (generalized) (M62.81)    Time: 8387-8371 PT Time Calculation (min) (ACUTE ONLY): 16 min   Charges:   PT Evaluation $PT Eval Moderate Complexity: 1 Mod   PT General Charges $$ ACUTE PT VISIT: 1 Visit         Camie R. Juli, PT, DPT, Cert. MDT 01/16/24, 5:10 PM

## 2024-01-16 NOTE — Progress Notes (Signed)
 PT Cancellation Note  Patient Details Name: Sarah Potts MRN: [id number] DOB: 1932-05-23   Cancelled Treatment:    Reason Eval/Treat Not Completed: Patient declined, no reason specified (attempted PT evaluation. pt not agreeable at this time stating she is going to finish eating first. Will re-attempt at later time/date as able)   Camie SAUNDERS. Juli, PT, DPT, Cert. MDT 01/16/24, 1:53 PM

## 2024-01-16 NOTE — Plan of Care (Signed)
   Problem: Education: Goal: Knowledge of General Education information will improve Description Including pain rating scale, medication(s)/side effects and non-pharmacologic comfort measures Outcome: Progressing

## 2024-01-16 NOTE — Evaluation (Signed)
 Occupational Therapy Evaluation Patient Details Name: Sarah Potts MRN: [id number] DOB: 07-05-1932 Today's Date: 01/16/2024   History of Present Illness   Sarah Potts is a 89 y.o. female with medical history significant of lumbar stenosis, sciatica, HTN, PAF on Xarelto , CAD status post stenting, chronic HFpEF, IIDM, CKD stage IIIb, anxiety/depression, presented with uncontrolled back pain and right leg pain. She fell 3 days prior to admission after having worsening of her pain. She came to the ED at that point where there was a negative CT scan for fracture or dislocation and she was sent home. She returned because of continued pain in the r leg and low back and was admitted this time. MRI of lumbar spine showed mostly chronic changes and some worsening L4-L5 endplate degeneration     Clinical Impressions Sarah Potts presents with generalized weakness, limited endurance, and pain. Prior to a fall at home earlier this week, pt has been largely IND, living alone, driving, managing B/IADL. During today's evaluation, pt endorses 10/10 pain with any movement. She is able to perform bed mobility w/o assistance but with high level of pain. She transfers sit<>stand ambulates with RW w/ CGA, close SUPV, demonstrating fair dynamic standing balance. She requires Mod-Max A for toileting hygiene and lower body dressing. Pt is far from her baseline level of fxl mobility and requires supervision/assistance with all OOB tasks. Recommend ongoing OT services while hospitalized, with DC plan that will allow her 5 days rehab per week, <3 hrs/day, to prevent falls and to support return to PLOF.     If plan is discharge home, recommend the following:   A little help with walking and/or transfers;A lot of help with bathing/dressing/bathroom;Assist for transportation;Assistance with cooking/housework     Functional Status Assessment   Patient has had a recent decline in their functional status and demonstrates the  ability to make significant improvements in function in a reasonable and predictable amount of time.     Equipment Recommendations   None recommended by OT     Recommendations for Other Services         Precautions/Restrictions   Precautions Precautions: Fall Restrictions Weight Bearing Restrictions Per Provider Order: No     Mobility Bed Mobility Overal bed mobility: Needs Assistance Bed Mobility: Supine to Sit, Sit to Supine     Supine to sit: Contact guard Sit to supine: Contact guard assist   General bed mobility comments: Pt able to perform w/o physical assist from therapist, although obviously painful for her and required extra time and effort    Transfers Overall transfer level: Needs assistance Equipment used: Rolling walker (2 wheels) Transfers: Sit to/from Stand Sit to Stand: Contact guard assist, From elevated surface                  Balance Overall balance assessment: Needs assistance Sitting-balance support: Bilateral upper extremity supported Sitting balance-Leahy Scale: Fair     Standing balance support: Reliant on assistive device for balance, Bilateral upper extremity supported, During functional activity Standing balance-Leahy Scale: Fair                             ADL either performed or assessed with clinical judgement   ADL Overall ADL's : Needs assistance/impaired     Grooming: Wash/dry hands;Wash/dry face;Standing;Supervision/safety Grooming Details (indicate cue type and reason): able to wash up at sink with brief periods of no UE support on sink or RW  Lower Body Dressing: Maximal assistance Lower Body Dressing Details (indicate cue type and reason): Required Max A for donning socks. Pt reports she is normally able to do this task INDly, but she can not bend far enough down this date. Toilet Transfer: Contact guard assist;Comfort height toilet;Rolling walker (2 wheels)   Toileting- Clothing  Manipulation and Hygiene: Moderate assistance;Supervision/safety Toileting - Clothing Manipulation Details (indicate cue type and reason): SUPV for ostomy care; Mod A for donning new Depends             Vision         Perception         Praxis         Pertinent Vitals/Pain Pain Assessment Pain Assessment: 0-10 Pain Score: 10-Worst pain ever Pain Location: back Pain Descriptors / Indicators: Aching, Grimacing, Guarding, Moaning Pain Intervention(s): Repositioned, Premedicated before session, Limited activity within patient's tolerance, Monitored during session     Extremity/Trunk Assessment Upper Extremity Assessment Upper Extremity Assessment: Overall WFL for tasks assessed   Lower Extremity Assessment Lower Extremity Assessment: Generalized weakness       Communication Communication Communication: No apparent difficulties   Cognition Arousal: Alert Behavior During Therapy: WFL for tasks assessed/performed Cognition: No apparent impairments                                       Cueing  General Comments          Exercises Other Exercises Other Exercises: Educ re: PoC, DC recs, importance of OOB mobility   Shoulder Instructions      Home Living Family/patient expects to be discharged to:: Private residence Living Arrangements: Alone Available Help at Discharge: Available PRN/intermittently;Family Type of Home: Apartment Home Access: Elevator     Home Layout: One level     Bathroom Shower/Tub: Chief Strategy Officer: Handicapped height     Home Equipment: Rollator (4 wheels);Cane - single point;Tub bench;Grab bars - toilet;Grab bars - tub/shower;Hand held shower head          Prior Functioning/Environment Prior Level of Function : Needs assist             Mobility Comments: Ambulatory with RW for household and limited community distances; + driving; fall leading to this hospitalization is her only fall this  year ADLs Comments: Pt largely IND in B/IADL. Son assists her with grocery shopping    OT Problem List: Decreased strength;Decreased range of motion;Decreased activity tolerance;Impaired balance (sitting and/or standing);Pain   OT Treatment/Interventions: Self-care/ADL training;Therapeutic exercise;Patient/family education;Balance training;Energy conservation;Therapeutic activities;DME and/or AE instruction      OT Goals(Current goals can be found in the care plan section)   Acute Rehab OT Goals Patient Stated Goal: to be out of pain OT Goal Formulation: With patient Time For Goal Achievement: 01/30/24 Potential to Achieve Goals: Good ADL Goals Pt Will Perform Lower Body Dressing: with modified independence;sit to/from stand;sitting/lateral leans Pt Will Transfer to Toilet: with modified independence;ambulating (w/ RW) Pt Will Perform Toileting - Clothing Manipulation and hygiene: with modified independence;sitting/lateral leans;sit to/from stand (including managing Depends and ostomy care) Additional ADL Goal #1: Pt will perform bed mobility with Mod I   OT Frequency:  Min 2X/week    Co-evaluation              AM-PAC OT 6 Clicks Daily Activity     Outcome Measure Help from another person eating meals?: None  Help from another person taking care of personal grooming?: A Little Help from another person toileting, which includes using toliet, bedpan, or urinal?: A Lot Help from another person bathing (including washing, rinsing, drying)?: A Lot Help from another person to put on and taking off regular upper body clothing?: A Little Help from another person to put on and taking off regular lower body clothing?: A Lot 6 Click Score: 16   End of Session Equipment Utilized During Treatment: Rolling walker (2 wheels)  Activity Tolerance: Patient tolerated treatment well Patient left: in bed;with call bell/phone within reach;with bed alarm set  OT Visit Diagnosis:  Unsteadiness on feet (R26.81);Other abnormalities of gait and mobility (R26.89);Muscle weakness (generalized) (M62.81);Pain                Time: 8591-8549 OT Time Calculation (min): 42 min Charges:  OT General Charges $OT Visit: 1 Visit OT Evaluation $OT Eval Moderate Complexity: 1 Mod OT Treatments $Self Care/Home Management : 38-52 mins Suzen Hock, PhD, MS, OTR/L 01/16/24, 3:29 PM

## 2024-01-16 NOTE — Care Management Obs Status (Signed)
 MEDICARE OBSERVATION STATUS NOTIFICATION   Patient Details  Name: Sarah Potts MRN: [id number] Date of Birth: 1932/08/25   Medicare Observation Status Notification Given:  Yes    [name] 01/16/2024, 1:00 PM

## 2024-01-17 DIAGNOSIS — W19XXXA Unspecified fall, initial encounter: Secondary | ICD-10-CM | POA: Diagnosis not present

## 2024-01-17 DIAGNOSIS — M5386 Other specified dorsopathies, lumbar region: Secondary | ICD-10-CM | POA: Diagnosis not present

## 2024-01-17 DIAGNOSIS — M5441 Lumbago with sciatica, right side: Secondary | ICD-10-CM | POA: Diagnosis not present

## 2024-01-17 LAB — COMPREHENSIVE METABOLIC PANEL WITH GFR
ALT: 24 U/L (ref 0–44)
AST: 33 U/L (ref 15–41)
Albumin: 2.8 g/dL — ABNORMAL LOW (ref 3.5–5.0)
Alkaline Phosphatase: 53 U/L (ref 38–126)
Anion gap: 13 (ref 5–15)
BUN: 29 mg/dL — ABNORMAL HIGH (ref 8–23)
CO2: 24 mmol/L (ref 22–32)
Calcium: 9.3 mg/dL (ref 8.9–10.3)
Chloride: 99 mmol/L (ref 98–111)
Creatinine, Ser: 1.33 mg/dL — ABNORMAL HIGH (ref 0.44–1.00)
GFR, Estimated: 38 mL/min — ABNORMAL LOW (ref >60)
Glucose, Bld: 145 mg/dL — ABNORMAL HIGH (ref 70–99)
Potassium: 4.5 mmol/L (ref 3.5–5.1)
Sodium: 136 mmol/L (ref 135–145)
Total Bilirubin: 0.7 mg/dL (ref 0.0–1.2)
Total Protein: 6.6 g/dL (ref 6.5–8.1)

## 2024-01-17 LAB — CBC WITH DIFFERENTIAL/PLATELET
Abs Immature Granulocytes: 0.07 K/uL (ref 0.00–0.07)
Basophils Absolute: 0 K/uL (ref 0.0–0.1)
Basophils Relative: 0 %
Eosinophils Absolute: 0.1 K/uL (ref 0.0–0.5)
Eosinophils Relative: 1 %
HCT: 30.2 % — ABNORMAL LOW (ref 36.0–46.0)
Hemoglobin: 10.1 g/dL — ABNORMAL LOW (ref 12.0–15.0)
Immature Granulocytes: 1 %
Lymphocytes Relative: 13 %
Lymphs Abs: 1 K/uL (ref 0.7–4.0)
MCH: 31.9 pg (ref 26.0–34.0)
MCHC: 33.4 g/dL (ref 30.0–36.0)
MCV: 95.3 fL (ref 80.0–100.0)
Monocytes Absolute: 0.5 K/uL (ref 0.1–1.0)
Monocytes Relative: 7 %
Neutro Abs: 5.9 K/uL (ref 1.7–7.7)
Neutrophils Relative %: 78 %
Platelets: 277 K/uL (ref 150–400)
RBC: 3.17 MIL/uL — ABNORMAL LOW (ref 3.87–5.11)
RDW: 15.4 % (ref 11.5–15.5)
WBC: 7.6 K/uL (ref 4.0–10.5)
nRBC: 0 % (ref 0.0–0.2)

## 2024-01-17 LAB — GLUCOSE, CAPILLARY
Glucose-Capillary: 153 mg/dL — ABNORMAL HIGH (ref 70–99)
Glucose-Capillary: 122 mg/dL — ABNORMAL HIGH (ref 70–99)
Glucose-Capillary: 246 mg/dL — ABNORMAL HIGH (ref 70–99)
Glucose-Capillary: 257 mg/dL — ABNORMAL HIGH (ref 70–99)

## 2024-01-17 NOTE — Progress Notes (Signed)
 PROGRESS NOTE    WAYNE BRUNKER  FMW:[id number] DOB: Nov 20, 1932 DOA: 01/15/2024 PCP: Stanton Lynwood FALCON, MD  Chief Complaint  Patient presents with   [facility] Course:  Sarah Potts is a 89 y.o. female with medical history significant of lumbar stenosis, sciatica, HTN, PAF on Xarelto , CAD status post stenting, chronic HFpEF, IIDM, CKD stage IIIb, anxiety/depression, presented with uncontrolled back pain and right leg pain. Patient has chronic pain causing ambulation impairment, she uses a rolling walker typically.  She receives periodic steroid injections with the last injection 1 month prior to arrival.  She presented to the ED for acute worsening and CT scan was negative for fracture or dislocation so she was discharged home.  She is a difficulty controlling her pain so she return to the ED on 10/3.  She was admitted for pain management and physical therapy.  Subjective: Patient still having significant pain.  Difficulty with ambulation.  Difficulty getting comfortable even in bed.  Currently propped up with multiple different pillows and is requiring assistance from RNs for bed movement.  Objective: Vitals:   01/16/24 1538 01/16/24 2139 01/17/24 0414 01/17/24 0717  BP: 131/60 (!) 145/62 (!) 125/54 (!) 180/67  Pulse: 64 64 62 60  Resp: 16 18 18 16   Temp: 98.3 F (36.8 C) 98.1 F (36.7 C) 98.5 F (36.9 C) 97.6 F (36.4 C)  TempSrc:    Oral  SpO2: 92% 98% 97% 97%  Weight:      Height:        Intake/Output Summary (Last 24 hours) at 01/17/2024 1257 Last data filed at 01/17/2024 1100 Gross per 24 hour  Intake 340 ml  Output 2400 ml  Net -2060 ml   Filed Weights   01/15/24 1056 01/15/24 1103  Weight: 82.6 kg 82.6 kg    Examination: General exam: Appears calm and appears uncomfortable, NAD  Respiratory system: No work of breathing, symmetric chest wall expansion Cardiovascular system: S1 & S2 heard, RRR.  Gastrointestinal system: Abdomen is nondistended, soft and  nontender.  Neuro: Alert and oriented.  Psychiatry: Demonstrates appropriate judgement and insight. Mood & affect appropriate for situation.   Assessment & Plan:  Principal Problem:   Back pain Active Problems:   Fall   Sciatica associated with disorder of lumbar spine   Acute on chronic back pain Ambulation impairment Worsening right leg sciatica Chronic lumbar stenosis multiple levels - No focal neurologic deficit - MRI lumbar spine: Mostly chronic changes, worsening L4-L5 endplate degeneration - Continue with current pain regimen, currently requiring IV pain meds - Continue with PT/OT.  Currently patient is requiring assistance for all ADLs.  Would benefit from SNF - TOC consulted to assist in SNF planning. - Neurosurgery follow-up outpatient for steroid injections, unfortunately not a surgical candidate given age and comorbidities  Chronic heart failure preserved EF Hypertension - Currently euvolemic - Continue home meds, titrate as needed  Paroxysmal atrial fibrillation - On Amio and Xarelto .  Continue.  Diabetes - On sliding scale insulin  for now given steroid therapy - Continue to monitor glucose closely.  Currently at goal.  Acute worsening secondary to steroid therapy. - Hemoglobin A1c 6.7%.  At baseline I do not anticipate she will require insulin .    GERD - Continue PPI  CKD stage IIIb - Creatinine appears to be near baseline at this time - Continue with current meds  Hyponatremia - Monitor closely  Elevated LFTs - AST 49, total bili 1.5.  No clear etiology for  this.  Repeat CMP shows complete resolution.  DVT prophylaxis: Renally dosed Xarelto    Code Status: Limited: Do not attempt resuscitation (DNR) -DNR-LIMITED -Do Not Intubate/DNI  Disposition:  Remains in house for pain control and PT, will require SNF at DC  Consultants:    Procedures:    Antimicrobials:  Anti-infectives (From admission, onward)    None       Data Reviewed: I have  personally reviewed following labs and imaging studies CBC: Recent Labs  Lab 01/15/24 1651 01/16/24 1015 01/17/24 0948  WBC 6.6 7.7 7.6  NEUTROABS 4.9 6.3 5.9  HGB 10.6* 9.9* 10.1*  HCT 32.6* 30.4* 30.2*  MCV 96.2 97.1 95.3  PLT 211 245 277   Basic Metabolic Panel: Recent Labs  Lab 01/15/24 1651 01/16/24 1015 01/17/24 0948  NA 136 131* 136  K 4.1 5.0 4.5  CL 105 97* 99  CO2 24 22 24   GLUCOSE 128* 182* 145*  BUN 14 21 29*  CREATININE 1.23* 1.28* 1.33*  CALCIUM  8.8* 8.6* 9.3   GFR: Estimated Creatinine Clearance: 28.6 mL/min (A) (by C-G formula based on SCr of 1.33 mg/dL (H)). Liver Function Tests: Recent Labs  Lab 01/16/24 1015 01/17/24 0948  AST 49* 33  ALT 24 24  ALKPHOS 58 53  BILITOT 1.5* 0.7  PROT 6.5 6.6  ALBUMIN 2.7* 2.8*   CBG: Recent Labs  Lab 01/16/24 1135 01/16/24 1635 01/16/24 2112 01/17/24 0722 01/17/24 1211  GLUCAP 192* 266* 226* 153* 122*    No results found for this or any previous visit (from the past 240 hours).   Radiology Studies: MR LUMBAR SPINE WO CONTRAST Result Date: 01/15/2024 EXAM: MRI LUMBAR SPINE 01/15/2024 03:23:00 PM TECHNIQUE: Multiplanar multisequence MRI of the lumbar spine was performed without the administration of intravenous contrast. COMPARISON: None available. CLINICAL HISTORY: Lumbar radiculopathy, trauma from fall, pain and numbness in both legs. FINDINGS: BONES AND ALIGNMENT: Normal alignment except for 4 mm degenerative anterolisthesis at L3-L4 and mild dextroconvex lumbar scoliosis. Chronic 50 percent anterior compression fracture at L3, not changed from previous. Small subcortical bands of edema anteriorly along the inferior endplate of L4 and posteriorly along the superior endplate of L5 are new compared to the prior exam. Given the subtlety of the findings on inversion recovery weighted images, I favored degenerative endplate findings over early/subtle endplate fractures, although the pattern is unusual. Adjacent  intervertebral disc appears normal. SPINAL CORD: The conus terminates normally at the T12-L1 level. SOFT TISSUES: No paraspinal mass. Nonspecific mild presacral edema. T12-L1: Unremarkable. L1-L2: No impingement. Disc bulge and small central disc protrusion. L2-L3: Moderate left eccentric central stenosis and moderate left foraminal stenosis due to left paracentral and lateral recess disc protrusion, intervertebral spurring, facet arthropathy. Similar to prior. L3-L4: Moderate central stenosis due to disc bulge, disc uncovering, facet arthropathy, and ligamentum flavum redundancy. Mildly worsened from prior. L4-L5: Moderate right and borderline left foraminal stenosis due to disc bulge, intervertebral spurring, right foraminal disc protrusion, and facet arthropathy. Similar to prior. L5-S1: Moderate bilateral foraminal stenosis due to disc bulge, right foraminal disc protrusion, and facet arthropathy. Similar to prior. IMPRESSION: 1. Moderate central stenosis at L3-4, mildly worsened from prior. 2. Moderate left eccentric central stenosis and moderate left foraminal stenosis at L2-3, similar to prior. 3. Moderate bilateral foraminal stenosis at L5-S1, similar to prior. 4. Moderate right foraminal stenosis at L4-5, similar to prior. 5. Small subcortical bands of edema at L4 and L5 endplates, new compared to prior exam, favored to represent degenerative endplate  findings. 6. Chronic 50% anterior compression fracture at L3, unchanged. Electronically signed by: Ryan Salvage MD 01/15/2024 03:40 PM EDT RP Workstation: [id number]    Scheduled Meds:  amiodarone   200 mg Oral Daily   amLODipine   2.5 mg Oral Daily   atorvastatin   40 mg Oral Daily   capsaicin   Topical BID   insulin  aspart  0-9 Units Subcutaneous TID WC   levothyroxine   25 mcg Oral Daily   loratadine  10 mg Oral Daily   losartan   50 mg Oral Daily   metoprolol  succinate  25 mg Oral Daily   montelukast   10 mg Oral QHS   pantoprazole   40 mg Oral  Daily   predniSONE  40 mg Oral Q breakfast   Rivaroxaban   15 mg Oral Q supper   sertraline   50 mg Oral Daily   Continuous Infusions:   LOS: 0 days  MDM: Patient is high risk for one or more organ failure.  They necessitate ongoing hospitalization for continued IV therapies and subsequent lab monitoring. Total time spent interpreting labs and vitals, reviewing the medical record, coordinating care amongst consultants and care team members, directly assessing and discussing care with the patient and/or family: 55 min  Zayli Villafuerte, DO [facility]  To contact the attending physician between 7A-7P please use Epic Chat. To contact the covering physician during after hours 7P-7A, please review Amion.  01/17/2024, 12:57 PM   *This document has been created with the assistance of dictation software. Please excuse typographical errors. *

## 2024-01-17 NOTE — Plan of Care (Signed)
  Problem: Skin Integrity: Goal: Risk for impaired skin integrity will decrease Outcome: Progressing   Problem: Pain Managment: Goal: General experience of comfort will improve and/or be controlled Outcome: Progressing   Problem: Elimination: Goal: Will not experience complications related to bowel motility Outcome: Progressing

## 2024-01-17 NOTE — Plan of Care (Signed)
  Problem: Coping: Goal: Ability to adjust to condition or change in health will improve Outcome: Progressing   Problem: Metabolic: Goal: Ability to maintain appropriate glucose levels will improve Outcome: Progressing   Problem: Education: Goal: Knowledge of General Education information will improve Description: Including pain rating scale, medication(s)/side effects and non-pharmacologic comfort measures Outcome: Progressing   Problem: Activity: Goal: Risk for activity intolerance will decrease Outcome: Progressing   Problem: Coping: Goal: Level of anxiety will decrease Outcome: Progressing

## 2024-01-18 DIAGNOSIS — F32A Depression, unspecified: Secondary | ICD-10-CM | POA: Diagnosis present

## 2024-01-18 DIAGNOSIS — I480 Paroxysmal atrial fibrillation: Secondary | ICD-10-CM | POA: Diagnosis present

## 2024-01-18 DIAGNOSIS — G8929 Other chronic pain: Secondary | ICD-10-CM | POA: Diagnosis present

## 2024-01-18 DIAGNOSIS — M48061 Spinal stenosis, lumbar region without neurogenic claudication: Secondary | ICD-10-CM | POA: Diagnosis present

## 2024-01-18 DIAGNOSIS — Z955 Presence of coronary angioplasty implant and graft: Secondary | ICD-10-CM | POA: Diagnosis not present

## 2024-01-18 DIAGNOSIS — E1165 Type 2 diabetes mellitus with hyperglycemia: Secondary | ICD-10-CM | POA: Diagnosis present

## 2024-01-18 DIAGNOSIS — Z7985 Long-term (current) use of injectable non-insulin antidiabetic drugs: Secondary | ICD-10-CM | POA: Diagnosis not present

## 2024-01-18 DIAGNOSIS — I5032 Chronic diastolic (congestive) heart failure: Secondary | ICD-10-CM | POA: Diagnosis present

## 2024-01-18 DIAGNOSIS — I2510 Atherosclerotic heart disease of native coronary artery without angina pectoris: Secondary | ICD-10-CM | POA: Diagnosis present

## 2024-01-18 DIAGNOSIS — N1832 Chronic kidney disease, stage 3b: Secondary | ICD-10-CM | POA: Diagnosis present

## 2024-01-18 DIAGNOSIS — M545 Low back pain, unspecified: Secondary | ICD-10-CM | POA: Diagnosis present

## 2024-01-18 DIAGNOSIS — M5386 Other specified dorsopathies, lumbar region: Secondary | ICD-10-CM | POA: Diagnosis not present

## 2024-01-18 DIAGNOSIS — Z66 Do not resuscitate: Secondary | ICD-10-CM | POA: Diagnosis present

## 2024-01-18 DIAGNOSIS — Z7901 Long term (current) use of anticoagulants: Secondary | ICD-10-CM | POA: Diagnosis not present

## 2024-01-18 DIAGNOSIS — M5441 Lumbago with sciatica, right side: Secondary | ICD-10-CM | POA: Diagnosis present

## 2024-01-18 DIAGNOSIS — E785 Hyperlipidemia, unspecified: Secondary | ICD-10-CM | POA: Diagnosis present

## 2024-01-18 DIAGNOSIS — K219 Gastro-esophageal reflux disease without esophagitis: Secondary | ICD-10-CM | POA: Diagnosis present

## 2024-01-18 DIAGNOSIS — T380X5A Adverse effect of glucocorticoids and synthetic analogues, initial encounter: Secondary | ICD-10-CM | POA: Diagnosis present

## 2024-01-18 DIAGNOSIS — E1122 Type 2 diabetes mellitus with diabetic chronic kidney disease: Secondary | ICD-10-CM | POA: Diagnosis present

## 2024-01-18 DIAGNOSIS — E871 Hypo-osmolality and hyponatremia: Secondary | ICD-10-CM | POA: Diagnosis present

## 2024-01-18 DIAGNOSIS — Z933 Colostomy status: Secondary | ICD-10-CM | POA: Diagnosis not present

## 2024-01-18 DIAGNOSIS — I130 Hypertensive heart and chronic kidney disease with heart failure and stage 1 through stage 4 chronic kidney disease, or unspecified chronic kidney disease: Secondary | ICD-10-CM | POA: Diagnosis present

## 2024-01-18 DIAGNOSIS — J45909 Unspecified asthma, uncomplicated: Secondary | ICD-10-CM | POA: Diagnosis present

## 2024-01-18 DIAGNOSIS — R5381 Other malaise: Secondary | ICD-10-CM | POA: Diagnosis present

## 2024-01-18 DIAGNOSIS — W19XXXA Unspecified fall, initial encounter: Secondary | ICD-10-CM | POA: Diagnosis present

## 2024-01-18 DIAGNOSIS — Z79890 Hormone replacement therapy (postmenopausal): Secondary | ICD-10-CM | POA: Diagnosis not present

## 2024-01-18 DIAGNOSIS — M549 Dorsalgia, unspecified: Secondary | ICD-10-CM | POA: Diagnosis present

## 2024-01-18 DIAGNOSIS — Z8249 Family history of ischemic heart disease and other diseases of the circulatory system: Secondary | ICD-10-CM | POA: Diagnosis not present

## 2024-01-18 LAB — GLUCOSE, CAPILLARY
Glucose-Capillary: 161 mg/dL — ABNORMAL HIGH (ref 70–99)
Glucose-Capillary: 273 mg/dL — ABNORMAL HIGH (ref 70–99)
Glucose-Capillary: 297 mg/dL — ABNORMAL HIGH (ref 70–99)
Glucose-Capillary: 268 mg/dL — ABNORMAL HIGH (ref 70–99)
Glucose-Capillary: 250 mg/dL — ABNORMAL HIGH (ref 70–99)

## 2024-01-18 NOTE — Plan of Care (Signed)
  Problem: Coping: Goal: Ability to adjust to condition or change in health will improve Outcome: Progressing   Problem: Metabolic: Goal: Ability to maintain appropriate glucose levels will improve Outcome: Progressing   Problem: Clinical Measurements: Goal: Will remain free from infection Outcome: Progressing   Problem: Coping: Goal: Level of anxiety will decrease Outcome: Progressing   Problem: Pain Managment: Goal: General experience of comfort will improve and/or be controlled Outcome: Progressing

## 2024-01-18 NOTE — Progress Notes (Signed)
 PROGRESS NOTE    Sarah Potts  FMW:[id number] DOB: Dec 26, 1932 DOA: 01/15/2024 PCP: Stanton Lynwood FALCON, MD  Chief Complaint  Patient presents with   [facility] Course:  Sarah Potts is a 89 y.o. female with medical history significant of lumbar stenosis, sciatica, HTN, PAF on Xarelto , CAD status post stenting, chronic HFpEF, IIDM, CKD stage IIIb, anxiety/depression, presented with uncontrolled back pain and right leg pain. Patient has chronic pain causing ambulation impairment, she uses a rolling walker typically.  She receives periodic steroid injections with the last injection 1 month prior to arrival.  She presented to the ED for acute worsening and CT scan was negative for fracture or dislocation so she was discharged home.  She is a difficulty controlling her pain so she return to the ED on 10/3.  She was admitted for pain management and physical therapy.  Subjective: Patient reports some improvement in her pain today.  She was able to work a bit better with physical therapy  Objective: Vitals:   01/17/24 1619 01/17/24 1953 01/18/24 0411 01/18/24 0736  BP: (!) 142/58 (!) 139/55 (!) 163/52 (!) 144/60  Pulse: 63 62 61 (!) 59  Resp: 16 18 18 16   Temp: 98.1 F (36.7 C) 98.1 F (36.7 C) 97.6 F (36.4 C) 97.9 F (36.6 C)  TempSrc:      SpO2: 91% 95% 97% 97%  Weight:      Height:        Intake/Output Summary (Last 24 hours) at 01/18/2024 1410 Last data filed at 01/18/2024 0300 Gross per 24 hour  Intake --  Output 700 ml  Net -700 ml   Filed Weights   01/15/24 1056 01/15/24 1103  Weight: 82.6 kg 82.6 kg    Examination: General exam: Appears calm and comfortable Respiratory system: No work of breathing, symmetric chest wall expansion Cardiovascular system: S1 & S2 heard, RRR.  Gastrointestinal system: Abdomen is nondistended, soft and nontender.  Neuro: Alert and oriented.  Psychiatry: Demonstrates appropriate judgement and insight. Mood & affect appropriate for  situation.   Assessment & Plan:  Principal Problem:   Back pain Active Problems:   Fall   Sciatica associated with disorder of lumbar spine   Acute on chronic low back pain   Acute on chronic back pain Ambulation impairment Worsening right leg sciatica Chronic lumbar stenosis multiple levels - No focal neurologic deficit - MRI lumbar spine: Mostly chronic changes, worsening L4-L5 endplate degeneration - Continue with current pain regimen, requiring IV pain meds.  Will continue to taper as tolerated - Continue with PT/OT.  Currently requiring assistance for all ADLs - TOC consulted for SNF placement. - Neurosurgery follow-up outpatient for steroid injections, unfortunately not a surgical candidate given age and comorbidities  Chronic heart failure preserved EF Hypertension - Currently euvolemic - Continue home meds, titrate as needed  Paroxysmal atrial fibrillation - On Amio and Xarelto .  Continue.  Diabetes - On sliding scale insulin  for now given steroid therapy - Continue to monitor glucose closely.  Currently at goal.  Acute worsening secondary to steroid therapy. - Hemoglobin A1c 6.7%.  At baseline I do not anticipate she will require insulin .    GERD - Continue PPI  CKD stage IIIb - Creatinine appears to be near baseline at this time - Continue with current meds  Hyponatremia - Monitor closely  Elevated LFTs - AST 49, total bili 1.5.  No clear etiology for this.  Repeat CMP shows complete resolution.  DVT prophylaxis:  Renally dosed Xarelto    Code Status: Limited: Do not attempt resuscitation (DNR) -DNR-LIMITED -Do Not Intubate/DNI  Disposition:  Remains in house for pain control and PT, will require SNF at DC.  TOC consulted to assist in arrangements  Consultants:    Procedures:    Antimicrobials:  Anti-infectives (From admission, onward)    None       Data Reviewed: I have personally reviewed following labs and imaging studies CBC: Recent Labs   Lab 01/15/24 1651 01/16/24 1015 01/17/24 0948  WBC 6.6 7.7 7.6  NEUTROABS 4.9 6.3 5.9  HGB 10.6* 9.9* 10.1*  HCT 32.6* 30.4* 30.2*  MCV 96.2 97.1 95.3  PLT 211 245 277   Basic Metabolic Panel: Recent Labs  Lab 01/15/24 1651 01/16/24 1015 01/17/24 0948  NA 136 131* 136  K 4.1 5.0 4.5  CL 105 97* 99  CO2 24 22 24   GLUCOSE 128* 182* 145*  BUN 14 21 29*  CREATININE 1.23* 1.28* 1.33*  CALCIUM  8.8* 8.6* 9.3   GFR: Estimated Creatinine Clearance: 28.6 mL/min (A) (by C-G formula based on SCr of 1.33 mg/dL (H)). Liver Function Tests: Recent Labs  Lab 01/16/24 1015 01/17/24 0948  AST 49* 33  ALT 24 24  ALKPHOS 58 53  BILITOT 1.5* 0.7  PROT 6.5 6.6  ALBUMIN 2.7* 2.8*   CBG: Recent Labs  Lab 01/17/24 1211 01/17/24 1615 01/17/24 2034 01/18/24 0739 01/18/24 1147  GLUCAP 122* 246* 257* 161* 273*    No results found for this or any previous visit (from the past 240 hours).   Radiology Studies: No results found.   Scheduled Meds:  amiodarone   200 mg Oral Daily   amLODipine   2.5 mg Oral Daily   atorvastatin   40 mg Oral Daily   capsaicin   Topical BID   insulin  aspart  0-9 Units Subcutaneous TID WC   levothyroxine   25 mcg Oral Daily   loratadine  10 mg Oral Daily   losartan   50 mg Oral Daily   metoprolol  succinate  25 mg Oral Daily   montelukast   10 mg Oral QHS   pantoprazole   40 mg Oral Daily   predniSONE  40 mg Oral Q breakfast   Rivaroxaban   15 mg Oral Q supper   sertraline   50 mg Oral Daily   Continuous Infusions:   LOS: 0 days  MDM: Patient is high risk for one or more organ failure.  They necessitate ongoing hospitalization for continued IV therapies and subsequent lab monitoring. Total time spent interpreting labs and vitals, reviewing the medical record, coordinating care amongst consultants and care team members, directly assessing and discussing care with the patient and/or family: 55 min  Lian Pounds, DO [facility]  To contact the  attending physician between 7A-7P please use Epic Chat. To contact the covering physician during after hours 7P-7A, please review Amion.  01/18/2024, 2:10 PM   *This document has been created with the assistance of dictation software. Please excuse typographical errors. *

## 2024-01-19 DIAGNOSIS — M545 Low back pain, unspecified: Secondary | ICD-10-CM | POA: Diagnosis not present

## 2024-01-19 DIAGNOSIS — G8929 Other chronic pain: Secondary | ICD-10-CM

## 2024-01-19 LAB — GLUCOSE, CAPILLARY
Glucose-Capillary: 126 mg/dL — ABNORMAL HIGH (ref 70–99)
Glucose-Capillary: 207 mg/dL — ABNORMAL HIGH (ref 70–99)
Glucose-Capillary: 327 mg/dL — ABNORMAL HIGH (ref 70–99)
Glucose-Capillary: 300 mg/dL — ABNORMAL HIGH (ref 70–99)

## 2024-01-19 MED ORDER — HYDROMORPHONE HCL 1 MG/ML IJ SOLN
0.5000 mg | INTRAMUSCULAR | Status: DC | PRN
  Administered 2024-01-20: 0.5 mg via INTRAVENOUS
  Filled 2024-01-19: qty 0.5

## 2024-01-19 NOTE — TOC Initial Note (Signed)
 Transition of Care [facility]) - Initial/Assessment Note    Patient Details  Name: Sarah Potts MRN: [id number] Date of Birth: 07/18/1932  [facility] CM/SW Contact:    Alvaro Louder, LCSW Phone Number: 01/19/2024, 10:31 AM  Clinical Narrative:        Per Chart review patient from Home. PCP is Lynwood Hunt LCSWA Faxed out information to SNF's in [place]. LCSWA will present Facilities to patient at the bedside.          TOC to follow for discharge       Patient Goals and CMS Choice            Expected Discharge Plan and Services                                              Prior Living Arrangements/Services                       Activities of Daily Living      Permission Sought/Granted                  Emotional Assessment              Admission diagnosis:  Back pain [M54.9] Inadequate pain control [R52] Decreased functional mobility [R26.89] Acute midline low back pain with sciatica, sciatica laterality unspecified [M54.40] Acute on chronic low back pain [M54.50, G89.29] Patient Active Problem List   Diagnosis Date Noted   Acute on chronic low back pain 01/18/2024   Sciatica associated with disorder of lumbar spine 01/15/2024   Back pain 01/15/2024   Dizziness 06/29/2023   HFrEF (heart failure with reduced ejection fraction) (HCC) 06/29/2023   Closed right tibial fracture 03/13/2023   Fall 03/06/2023   H/O adenomatous polyp of colon 09/29/2022   Stress-induced cardiomyopathy    Ischemic cardiomyopathy    NSTEMI (non-ST elevated myocardial infarction) (HCC) 12/20/2021   Atrial fibrillation, chronic (HCC) 12/20/2021   Type II diabetes mellitus with renal manifestations (HCC) 12/20/2021   Obesity with body mass index (BMI) of 30.0 to 39.9 12/20/2021   Chronic diastolic CHF (congestive heart failure) (HCC) 12/20/2021   UTI (urinary tract infection) 12/20/2021   Asthma 12/01/2021   Atrial fibrillation (HCC)  12/01/2021   Colostomy in place (HCC) 03/16/2021   Absolute anemia 01/09/2021   Lung nodule 01/09/2021   Anemia in stage 3a chronic kidney disease (HCC) 01/09/2021   B12 deficiency 01/09/2021   Stage 3a chronic kidney disease (HCC) 01/09/2021   Goals of care, counseling/discussion 12/18/2020   History of anal cancer 12/11/2020   Diarrhea 01/02/2014   Iron  deficiency anemia 01/02/2014   Bronchitis 12/15/2012   CAD (coronary artery disease) 11/13/2010   HTN (hypertension) 11/13/2010   Diabetes mellitus (HCC) 11/13/2010   Hyperlipidemia 11/13/2010   PCP:  Hunt Lynwood FALCON, MD Pharmacy:   [facility] [phone] [place], [place] - [address] [place] [address] Phone: [phone] Fax: [phone]  [facility] [phone] - [place], [place] - [address] [place] [address] Phone: [phone] Fax: [phone]     Social Drivers of Health (SDOH) Social History: SDOH Screenings   Food Insecurity: No Food Insecurity (01/16/2024)  Housing: Low Risk  (01/16/2024)  Transportation Needs: No Transportation Needs (01/16/2024)  Utilities: Not At Risk (01/16/2024)  Financial Resource Strain:  Low Risk  (10/19/2023)   Received from [facility]  Social Connections: Socially Isolated (01/16/2024)  Tobacco Use: Low Risk  (01/15/2024)   SDOH Interventions:     Readmission Risk Interventions     No data to display

## 2024-01-19 NOTE — [place] FL2 (Signed)
 [place]  MEDICAID FL2 LEVEL OF CARE FORM     IDENTIFICATION  Patient Name: Sarah Potts Birthdate: 15-Mar-1933 Sex: female Admission Date (Current Location): 01/15/2024  [facility] and [place] Number:  Chiropodist and Address:  [facility], [address] [place], [place] [address]      Provider Number: [id number]  Attending Physician Name and Address:  Leesa Kast, DO  Relative Name and Phone Number:       Current Level of Care: Hospital Recommended Level of Care: Skilled Nursing Facility Prior Approval Number:    Date Approved/Denied:   PASRR Number:    Discharge Plan: SNF    Current Diagnoses: Patient Active Problem List   Diagnosis Date Noted   Acute on chronic low back pain 01/18/2024   Sciatica associated with disorder of lumbar spine 01/15/2024   Back pain 01/15/2024   Dizziness 06/29/2023   HFrEF (heart failure with reduced ejection fraction) (HCC) 06/29/2023   Closed right tibial fracture 03/13/2023   Fall 03/06/2023   H/O adenomatous polyp of colon 09/29/2022   Stress-induced cardiomyopathy    Ischemic cardiomyopathy    NSTEMI (non-ST elevated myocardial infarction) (HCC) 12/20/2021   Atrial fibrillation, chronic (HCC) 12/20/2021   Type II diabetes mellitus with renal manifestations (HCC) 12/20/2021   Obesity with body mass index (BMI) of 30.0 to 39.9 12/20/2021   Chronic diastolic CHF (congestive heart failure) (HCC) 12/20/2021   UTI (urinary tract infection) 12/20/2021   Asthma 12/01/2021   Atrial fibrillation (HCC) 12/01/2021   Colostomy in place (HCC) 03/16/2021   Absolute anemia 01/09/2021   Lung nodule 01/09/2021   Anemia in stage 3a chronic kidney disease (HCC) 01/09/2021   B12 deficiency 01/09/2021   Stage 3a chronic kidney disease (HCC) 01/09/2021   Goals of care, counseling/discussion 12/18/2020   History of anal cancer 12/11/2020   Diarrhea 01/02/2014   Iron  deficiency anemia 01/02/2014    Bronchitis 12/15/2012   CAD (coronary artery disease) 11/13/2010   HTN (hypertension) 11/13/2010   Diabetes mellitus (HCC) 11/13/2010   Hyperlipidemia 11/13/2010    Orientation RESPIRATION BLADDER Height & Weight     Self, Time, Situation, Place    Continent Weight: 182 lb (82.6 kg) Height:  5' 2 (157.5 cm)  BEHAVIORAL SYMPTOMS/MOOD NEUROLOGICAL BOWEL NUTRITION STATUS      Continent Diet (Heart)  AMBULATORY STATUS COMMUNICATION OF NEEDS Skin   Extensive Assist Verbally Normal                       Personal Care Assistance Level of Assistance  Bathing, Dressing, Feeding Bathing Assistance: Limited assistance Feeding assistance: Independent Dressing Assistance: Limited assistance     Functional Limitations Info  Sight, Hearing, Speech Sight Info: Impaired Hearing Info: Adequate Speech Info: Adequate    SPECIAL CARE FACTORS FREQUENCY  PT (By licensed PT), OT (By licensed OT)     PT Frequency: 5x/week OT Frequency: 5x/week            Contractures      Additional Factors Info  Code Status, Allergies Code Status Info: DNR Limited Allergies Info: Levofloxacin, Niacin, Niacin And Related           Current Medications (01/19/2024):  This is the current hospital active medication list Current Facility-Administered Medications  Medication Dose Route Frequency Provider Last Rate Last Admin   acetaminophen  (TYLENOL ) tablet 650 mg  650 mg Oral Q8H PRN Laurita Cort DASEN, MD       albuterol  (PROVENTIL ) (2.5  MG/3ML) 0.083% nebulizer solution 3 mL  3 mL Inhalation Q4H PRN Laurita Manor T, MD       amiodarone  (PACERONE ) tablet 200 mg  200 mg Oral Daily Laurita Manor T, MD   200 mg at 01/19/24 0841   amLODipine  (NORVASC ) tablet 2.5 mg  2.5 mg Oral Daily Laurita Manor T, MD   2.5 mg at 01/19/24 0841   atorvastatin  (LIPITOR) tablet 40 mg  40 mg Oral Daily Laurita Manor T, MD   40 mg at 01/18/24 2108   bisacodyl (DULCOLAX) EC tablet 5 mg  5 mg Oral Daily PRN Laurita Manor DASEN, MD        capsaicin (ZOSTRIX) 0.025 % cream   Topical BID Laurita Manor DASEN, MD   Given at 01/18/24 2109   HYDROmorphone (DILAUDID) injection 0.5-1 mg  0.5-1 mg Intravenous Q2H PRN Laurita Manor T, MD   1 mg at 01/19/24 9157   insulin  aspart (novoLOG ) injection 0-9 Units  0-9 Units Subcutaneous TID WC Laurita Manor DASEN, MD   1 Units at 01/19/24 9160   levothyroxine  (SYNTHROID ) tablet 25 mcg  25 mcg Oral Daily Laurita Manor T, MD   25 mcg at 01/19/24 0552   loratadine (CLARITIN) tablet 10 mg  10 mg Oral Daily Zhang, Ping T, MD   10 mg at 01/18/24 2108   losartan  (COZAAR ) tablet 50 mg  50 mg Oral Daily Zhang, Ping T, MD   50 mg at 01/19/24 0840   meclizine  (ANTIVERT ) tablet 25 mg  25 mg Oral Q6H PRN Laurita Manor DASEN, MD       metoprolol  succinate (TOPROL -XL) 24 hr tablet 25 mg  25 mg Oral Daily Zhang, Ping T, MD   25 mg at 01/19/24 9158   montelukast  (SINGULAIR ) tablet 10 mg  10 mg Oral QHS Zhang, Ping T, MD   10 mg at 01/18/24 2108   ondansetron  (ZOFRAN ) tablet 4 mg  4 mg Oral Q6H PRN Laurita Manor DASEN, MD       Or   ondansetron  (ZOFRAN ) injection 4 mg  4 mg Intravenous Q6H PRN Laurita Manor T, MD       oxyCODONE  (Oxy IR/ROXICODONE ) immediate release tablet 5 mg  5 mg Oral Q6H PRN Laurita Manor T, MD   5 mg at 01/19/24 0344   pantoprazole  (PROTONIX ) EC tablet 40 mg  40 mg Oral Daily Laurita Manor T, MD   40 mg at 01/19/24 0840   predniSONE (DELTASONE) tablet 40 mg  40 mg Oral Q breakfast Laurita Manor T, MD   40 mg at 01/19/24 0840   Rivaroxaban  (XARELTO ) tablet 15 mg  15 mg Oral Q supper Laurita Manor T, MD   15 mg at 01/18/24 1730   senna-docusate (Senokot-S) tablet 1 tablet  1 tablet Oral QHS PRN Laurita Manor DASEN, MD   1 tablet at 01/16/24 2102   sertraline  (ZOLOFT ) tablet 50 mg  50 mg Oral Daily Zhang, Ping T, MD   50 mg at 01/19/24 0840     Discharge Medications: Please see discharge summary for a list of discharge medications.  Relevant Imaging Results:  Relevant Lab Results:   Additional Information SSN:  [id number]  Alvaro Louder, LCSW

## 2024-01-19 NOTE — Inpatient Diabetes Management (Signed)
 Inpatient Diabetes Program Recommendations  AACE/ADA: New Consensus Statement on Inpatient Glycemic Control   Target Ranges:  Prepandial:   less than 140 mg/dL      [facility] postprandial:   less than 180 mg/dL (1-2 hours)      Critically ill patients:  140 - 180 mg/dL    Latest Reference Range & Units 01/18/24 07:39 01/18/24 11:47 01/18/24 15:22 01/18/24 16:30 01/18/24 20:22 01/19/24 08:03 01/19/24 11:33  Glucose-Capillary 70 - 99 mg/dL 838 (H) 726 (H) 702 (H) 268 (H) 250 (H) 126 (H) 207 (H)    Review of Glycemic Control  Diabetes history: DM2 Outpatient Diabetes medications: Actos 30 mg daily, Metformin  500 mg BID, Trulicity 1.5 mg Qweek Current orders for Inpatient glycemic control: Novolog  0-9 units TID; Prednisone 40 mg QAM  Inpatient Diabetes Program Recommendations:    Insulin : If steroids are continued, please consider ordering Novolog  2 units TID with meals for meal coverage if patient eats at least 50% of meals.  Thanks, Earnie Gainer, RN, MSN, CDCES Diabetes Coordinator Inpatient Diabetes Program [phone] (Team Pager from 8am to 5pm)

## 2024-01-19 NOTE — TOC Progression Note (Signed)
 Transition of Care [facility]) - Progression Note    Patient Details  Name: Sarah Potts MRN: [id number] Date of Birth: May 25, 1932  [facility] of [facility]) CM/SW Contact  Zeeshan Korte  [place], [place] Phone Number: 01/19/2024, 4:33 PM  Clinical Narrative:   Patient Auth approved for her to [facility] when medically ready. Approved Certification#:[id number]. Plan is to admit tomorrow.   TOC to follow for discharge                      Expected Discharge Plan and Services                                               Social Drivers of Health (SDOH) Interventions SDOH Screenings   Food Insecurity: No Food Insecurity (01/16/2024)  Housing: Low Risk  (01/16/2024)  Transportation Needs: No Transportation Needs (01/16/2024)  Utilities: Not At Risk (01/16/2024)  Financial Resource Strain: Low Risk  (10/19/2023)   Received from [facility]  Social Connections: Socially Isolated (01/16/2024)  Tobacco Use: Low Risk  (01/15/2024)    Readmission Risk Interventions     No data to display

## 2024-01-19 NOTE — Progress Notes (Signed)
 Physical Therapy Treatment Patient Details Name: Sarah Potts MRN: [id number] DOB: 1932/11/27 Today's Date: 01/19/2024   History of Present Illness Sarah Potts is a 89 y.o. female with medical history significant of lumbar stenosis, sciatica, HTN, PAF on Xarelto , CAD status post stenting, chronic HFpEF, IIDM, CKD stage IIIb, anxiety/depression, presented with uncontrolled back pain and right leg pain. She fell 3 days prior to admission after having worsening of her pain. She came to the ED at that point where there was a negative CT scan for fracture or dislocation and she was sent home. She returned because of continued pain in the r leg and low back and was admitted this time. MRI of lumbar spine showed mostly chronic changes and some worsening L4-L5 endplate degeneration    PT Comments  Pt ready for session.  She stated she was up earlier today but is agreeable to gait.  Premedicated.  She is able to get to EOB with inc time and bed features.  Stands and walks self selected gait distance of 100' motivated to try to walk further.  Gait is generally steady but after 70' pain increases and she takes a short seated rest in staxi chair in hallway.  She only sits briefly then opts to walk back to bed and return to supine.  She struggles with frequent self initiated rest breaks but is able to return to bed with mod a for LE's for comfort.   If plan is discharge home, recommend the following: A little help with walking and/or transfers;A little help with bathing/dressing/bathroom;Assistance with cooking/housework;Assist for transportation;Help with stairs or ramp for entrance   Can travel by private vehicle        Equipment Recommendations  BSC/3in1    Recommendations for Other Services       Precautions / Restrictions Precautions Precautions: Fall Restrictions Weight Bearing Restrictions Per Provider Order: No     Mobility  Bed Mobility Overal bed mobility: Needs Assistance Bed  Mobility: Supine to Sit, Sit to Supine     Supine to sit: Min assist, HOB elevated, Used rails Sit to supine: Mod assist, HOB elevated, Used rails   General bed mobility comments: increased assist for LE's due to pain more than weakness Patient Response: Cooperative  Transfers Overall transfer level: Needs assistance Equipment used: Rolling walker (2 wheels) Transfers: Sit to/from Stand Sit to Stand: Contact guard assist, Min assist, From elevated surface                Ambulation/Gait Ambulation/Gait assistance: Contact guard assist Gait Distance (Feet): 100 Feet Assistive device: Rolling walker (2 wheels) Gait Pattern/deviations: Step-through pattern, Decreased step length - right, Decreased step length - left, Trunk flexed Gait velocity: decreased     General Gait Details: generally steady, pain limited   Stairs             Wheelchair Mobility     Tilt Bed Tilt Bed Patient Response: Cooperative  Modified Rankin (Stroke Patients Only)       Balance Overall balance assessment: Needs assistance Sitting-balance support: Bilateral upper extremity supported Sitting balance-Leahy Scale: Good     Standing balance support: Reliant on assistive device for balance, Bilateral upper extremity supported, During functional activity Standing balance-Leahy Scale: Fair Standing balance comment: pain limited                            Communication Communication Communication: Impaired Factors Affecting Communication: Hearing impaired  Cognition Arousal:  Alert Behavior During Therapy: WFL for tasks assessed/performed   PT - Cognitive impairments: No apparent impairments                         Following commands: Intact      Cueing Cueing Techniques: Verbal cues  Exercises      General Comments        Pertinent Vitals/Pain Pain Assessment Pain Assessment: Faces Faces Pain Scale: Hurts whole lot Pain Location: low back, B  buttocks and legs R > L feel numb and painful Pain Descriptors / Indicators: Discomfort Pain Intervention(s): Limited activity within patient's tolerance, Monitored during session, Repositioned    Home Living                          Prior Function            PT Goals (current goals can now be found in the care plan section) Progress towards PT goals: Progressing toward goals    Frequency    Min 3X/week      PT Plan      Co-evaluation              AM-PAC PT 6 Clicks Mobility   Outcome Measure  Help needed turning from your back to your side while in a flat bed without using bedrails?: A Little Help needed moving from lying on your back to sitting on the side of a flat bed without using bedrails?: A Little Help needed moving to and from a bed to a chair (including a wheelchair)?: A Little Help needed standing up from a chair using your arms (e.g., wheelchair or bedside chair)?: A Little Help needed to walk in hospital room?: A Little Help needed climbing 3-5 steps with a railing? : A Lot 6 Click Score: 17    End of Session Equipment Utilized During Treatment: Gait belt Activity Tolerance: Patient tolerated treatment well;Patient limited by pain Patient left: in bed;with call bell/phone within reach;with bed alarm set Nurse Communication: Mobility status PT Visit Diagnosis: Other abnormalities of gait and mobility (R26.89);Difficulty in walking, not elsewhere classified (R26.2);History of falling (Z91.81);Muscle weakness (generalized) (M62.81)     Time: 9061-8997 PT Time Calculation (min) (ACUTE ONLY): 24 min  Charges:    $Gait Training: 23-37 mins PT General Charges $$ ACUTE PT VISIT: 1 Visit                   Lauraine Gills, PTA 01/19/24, 10:09 AM

## 2024-01-19 NOTE — Plan of Care (Signed)
  Problem: Fluid Volume: Goal: Ability to maintain a balanced intake and output will improve Outcome: Progressing   Problem: Metabolic: Goal: Ability to maintain appropriate glucose levels will improve Outcome: Progressing   

## 2024-01-19 NOTE — Progress Notes (Signed)
 PROGRESS NOTE    [name]  FMW:[id number] DOB: 04-18-1932 DOA: 01/15/2024 PCP: Stanton Lynwood FALCON, MD  Chief Complaint  Patient presents with   [facility] Course:  Sarah Potts is a 89 y.o. female with medical history significant of lumbar stenosis, sciatica, HTN, PAF on Xarelto , CAD status post stenting, chronic HFpEF, IIDM, CKD stage IIIb, anxiety/depression, presented with uncontrolled back pain and right leg pain. Patient has chronic pain causing ambulation impairment, she uses a rolling walker typically.  She receives periodic steroid injections with the last injection 1 month prior to arrival.  She presented to the ED for acute worsening and CT scan was negative for fracture or dislocation so she was discharged home.  She is a difficulty controlling her pain so she return to the ED on 10/3.  She was admitted for pain management and physical therapy.  She did well with physical therapy but remains too debilitated to return home unassisted.  She is now pending SNF placement  Subjective: No acute complaints today.  Patient's pain is better controlled.  She continues to do well with PT team  Objective: Vitals:   01/18/24 2004 01/19/24 0354 01/19/24 0621 01/19/24 0736  BP: (!) 171/63 (!) 190/78 (!) 172/62 (!) 152/59  Pulse: 63 63 63 (!) 59  Resp: 18 18  15   Temp: 98 F (36.7 C) (!) 97.5 F (36.4 C)  98 F (36.7 C)  TempSrc:      SpO2: 99% 100%  97%  Weight:      Height:        Intake/Output Summary (Last 24 hours) at 01/19/2024 1509 Last data filed at 01/19/2024 1300 Gross per 24 hour  Intake 360 ml  Output 2300 ml  Net -1940 ml   Filed Weights   01/15/24 1056 01/15/24 1103  Weight: 82.6 kg 82.6 kg    Examination: General exam: Appears calm and comfortable Respiratory system: No work of breathing, symmetric chest wall expansion Cardiovascular system: S1 & S2 heard, RRR.  Gastrointestinal system: Abdomen is nondistended, soft and nontender.  Neuro: Alert  and oriented.  Psychiatry: Demonstrates appropriate judgement and insight. Mood & affect appropriate for situation.   Assessment & Plan:  Principal Problem:   Back pain Active Problems:   Fall   Sciatica associated with disorder of lumbar spine   Acute on chronic low back pain   Acute on chronic back pain Ambulation impairment Worsening right leg sciatica Chronic lumbar stenosis multiple levels - No focal neurologic deficit - MRI lumbar spine: Mostly chronic changes, worsening L4-L5 endplate degeneration - Continue with current pain regimen, requiring IV pain meds.  Will continue to taper as tolerated.  Discontinue prednisone today - Continue with PT/OT.  Currently requiring assistance for all ADLs - Pending SNF placement - Neurosurgery follow-up outpatient for steroid injections, unfortunately not a surgical candidate given age and comorbidities  Chronic heart failure preserved EF Hypertension - Currently euvolemic - Continue home meds, titrate as needed  Paroxysmal atrial fibrillation - On Amio and Xarelto .  Continue.  Diabetes - On sliding scale insulin  for now given steroid therapy - Continue to monitor glucose closely.  Currently at goal.  Acute worsening secondary to steroid therapy. - Discontinued prednisone today - Hemoglobin A1c 6.7%.  At baseline I do not anticipate she will require insulin .    GERD - Continue PPI  CKD stage IIIb - Creatinine appears to be near baseline at this time - Continue with current meds  Hyponatremia -  Monitor closely  Elevated LFTs - AST 49, total bili 1.5.  No clear etiology for this.  Repeat CMP shows complete resolution.  DVT prophylaxis: Renally dosed Xarelto    Code Status: Limited: Do not attempt resuscitation (DNR) -DNR-LIMITED -Do Not Intubate/DNI  Disposition:  Remains in house for pain control and PT, will require SNF at DC.  TOC consulted to assist in arrangements  Consultants:    Procedures:    Antimicrobials:   Anti-infectives (From admission, onward)    None       Data Reviewed: I have personally reviewed following labs and imaging studies CBC: Recent Labs  Lab 01/15/24 1651 01/16/24 1015 01/17/24 0948  WBC 6.6 7.7 7.6  NEUTROABS 4.9 6.3 5.9  HGB 10.6* 9.9* 10.1*  HCT 32.6* 30.4* 30.2*  MCV 96.2 97.1 95.3  PLT 211 245 277   Basic Metabolic Panel: Recent Labs  Lab 01/15/24 1651 01/16/24 1015 01/17/24 0948  NA 136 131* 136  K 4.1 5.0 4.5  CL 105 97* 99  CO2 24 22 24   GLUCOSE 128* 182* 145*  BUN 14 21 29*  CREATININE 1.23* 1.28* 1.33*  CALCIUM  8.8* 8.6* 9.3   GFR: Estimated Creatinine Clearance: 28.6 mL/min (A) (by C-G formula based on SCr of 1.33 mg/dL (H)). Liver Function Tests: Recent Labs  Lab 01/16/24 1015 01/17/24 0948  AST 49* 33  ALT 24 24  ALKPHOS 58 53  BILITOT 1.5* 0.7  PROT 6.5 6.6  ALBUMIN 2.7* 2.8*   CBG: Recent Labs  Lab 01/18/24 1522 01/18/24 1630 01/18/24 2022 01/19/24 0803 01/19/24 1133  GLUCAP 297* 268* 250* 126* 207*    No results found for this or any previous visit (from the past 240 hours).   Radiology Studies: No results found.   Scheduled Meds:  amiodarone   200 mg Oral Daily   amLODipine   2.5 mg Oral Daily   atorvastatin   40 mg Oral Daily   capsaicin   Topical BID   insulin  aspart  0-9 Units Subcutaneous TID WC   levothyroxine   25 mcg Oral Daily   loratadine  10 mg Oral Daily   losartan   50 mg Oral Daily   metoprolol  succinate  25 mg Oral Daily   montelukast   10 mg Oral QHS   pantoprazole   40 mg Oral Daily   predniSONE  40 mg Oral Q breakfast   Rivaroxaban   15 mg Oral Q supper   sertraline   50 mg Oral Daily   Continuous Infusions:   LOS: 1 day  MDM: Patient is high risk for one or more organ failure.  They necessitate ongoing hospitalization for continued IV therapies and subsequent lab monitoring. Total time spent interpreting labs and vitals, reviewing the medical record, coordinating care amongst consultants  and care team members, directly assessing and discussing care with the patient and/or family: 55 min  Laniyah Rosenwald, DO [facility]  To contact the attending physician between 7A-7P please use Epic Chat. To contact the covering physician during after hours 7P-7A, please review Amion.  01/19/2024, 3:09 PM   *This document has been created with the assistance of dictation software. Please excuse typographical errors. *

## 2024-01-19 NOTE — Progress Notes (Signed)
 Occupational Therapy Treatment Patient Details Name: Sarah Potts MRN: [id number] DOB: [birth year] Today's Date: 01/19/2024   History of present illness Sarah Potts is a 89 y.o. female with medical history significant of lumbar stenosis, sciatica, HTN, PAF on Xarelto , CAD status post stenting, chronic HFpEF, IIDM, CKD stage IIIb, anxiety/depression, presented with uncontrolled back pain and right leg pain. She fell 3 days prior to admission after having worsening of her pain. She came to the ED at that point where there was a negative CT scan for fracture or dislocation and she was sent home. She returned because of continued pain in the r leg and low back and was admitted this time. MRI of lumbar spine showed mostly chronic changes and some worsening L4-L5 endplate degeneration   OT comments  Chart reviewed to date, pt greeted semi supine in bed, agreeable to OT tx session targeting improving functional activity tolerance in prep for ADL tasks. Pt continues to present with functional limitations especially affecting her ability to perform dressing and toileting tasks. Please see further details below. OT will continue to follow.       If plan is discharge home, recommend the following:  A little help with walking and/or transfers;A lot of help with bathing/dressing/bathroom;Assist for transportation;Assistance with cooking/housework   Equipment Recommendations  Other (comment) (defer to next venue of care)    Recommendations for Other Services      Precautions / Restrictions Precautions Precautions: Fall Recall of Precautions/Restrictions: Intact Restrictions Weight Bearing Restrictions Per Provider Order: No       Mobility Bed Mobility Overal bed mobility: Needs Assistance Bed Mobility: Rolling, Sidelying to Sit, Sit to Sidelying Rolling: Min assist   Supine to sit: Min assist, HOB elevated, Used rails   Sit to sidelying: Max assist      Transfers Overall transfer  level: Needs assistance Equipment used: Rolling walker (2 wheels) Transfers: Sit to/from Stand Sit to Stand: Contact guard assist                 Balance Overall balance assessment: Needs assistance Sitting-balance support: Bilateral upper extremity supported Sitting balance-Leahy Scale: Good     Standing balance support: Reliant on assistive device for balance, Bilateral upper extremity supported, During functional activity Standing balance-Leahy Scale: Fair                             ADL either performed or assessed with clinical judgement   ADL Overall ADL's : Needs assistance/impaired     Grooming: Wash/dry hands;Supervision/safety;Sitting               Lower Body Dressing: Maximal assistance Lower Body Dressing Details (indicate cue type and reason): doff/donn new brief Toilet Transfer: Contact guard assist;BSC/3in1;Rolling walker (2 wheels)   Toileting- Clothing Manipulation and Hygiene: Minimal assistance Toileting - Clothing Manipulation Details (indicate cue type and reason): MIN A for ostomy care in sitting (pt normally stands)     Functional mobility during ADLs: Contact guard assist;Rolling walker (2 wheels);Minimal assistance (approx 15' two attempts)      Extremity/Trunk Assessment              Vision       Perception     Praxis     Communication Communication Communication: Impaired Factors Affecting Communication: Hearing impaired   Cognition Arousal: Alert Behavior During Therapy: WFL for tasks assessed/performed Cognition: No apparent impairments  Following commands: Intact        Cueing   Cueing Techniques: Verbal cues  Exercises Other Exercises Other Exercises: edu re role of OT, role of rehab, discharge recommendations    Shoulder Instructions       General Comments vss on RA    Pertinent Vitals/ Pain       Pain Assessment Pain Assessment: 0-10 Pain  Score: 10-Worst pain ever Pain Location: Low back Pain Descriptors / Indicators: Discomfort Pain Intervention(s): Limited activity within patient's tolerance, Monitored during session, Premedicated before session, Repositioned  Home Living                                          Prior Functioning/Environment              Frequency  Min 2X/week        Progress Toward Goals  OT Goals(current goals can now be found in the care plan section)  Progress towards OT goals: Progressing toward goals  Acute Rehab OT Goals Time For Goal Achievement: 01/30/24  Plan      Co-evaluation                 AM-PAC OT 6 Clicks Daily Activity     Outcome Measure   Help from another person eating meals?: None Help from another person taking care of personal grooming?: A Little Help from another person toileting, which includes using toliet, bedpan, or urinal?: A Lot Help from another person bathing (including washing, rinsing, drying)?: A Lot Help from another person to put on and taking off regular upper body clothing?: A Little Help from another person to put on and taking off regular lower body clothing?: A Lot 6 Click Score: 16    End of Session Equipment Utilized During Treatment: Rolling walker (2 wheels)  OT Visit Diagnosis: Unsteadiness on feet (R26.81);Other abnormalities of gait and mobility (R26.89);Muscle weakness (generalized) (M62.81);Pain   Activity Tolerance Patient tolerated treatment well   Patient Left in bed;with call bell/phone within reach;with bed alarm set   Nurse Communication          Time: 8650-8584 OT Time Calculation (min): 26 min  Charges: OT General Charges $OT Visit: 1 Visit OT Treatments $Self Care/Home Management : 8-22 mins $Therapeutic Activity: 8-22 mins  Therisa Sheffield, OTD OTR/L  01/19/24, 2:23 PM

## 2024-01-20 LAB — GLUCOSE, CAPILLARY
Glucose-Capillary: 123 mg/dL — ABNORMAL HIGH (ref 70–99)
Glucose-Capillary: 132 mg/dL — ABNORMAL HIGH (ref 70–99)

## 2024-01-20 MED ORDER — PREGABALIN 50 MG PO CAPS
100.0000 mg | ORAL_CAPSULE | Freq: Two times a day (BID) | ORAL | Status: DC
Start: 2024-01-20 — End: 2024-01-20
  Administered 2024-01-20: 100 mg via ORAL
  Filled 2024-01-20: qty 2

## 2024-01-20 MED ORDER — OXYCODONE HCL 5 MG PO TABS: 5.0000 mg | ORAL_TABLET | Freq: Four times a day (QID) | ORAL | 0 refills | Status: AC | PRN

## 2024-01-20 MED ORDER — SENNA 8.6 MG PO TABS
1.0000 | ORAL_TABLET | Freq: Every day | ORAL | Status: DC
  Administered 2024-01-20: 8.6 mg via ORAL
  Filled 2024-01-20: qty 1

## 2024-01-20 MED ORDER — POLYETHYLENE GLYCOL 3350 17 G PO PACK
17.0000 g | PACK | Freq: Two times a day (BID) | ORAL | Status: DC
  Administered 2024-01-20: 17 g via ORAL
  Filled 2024-01-20: qty 1

## 2024-01-20 MED ORDER — POLYETHYLENE GLYCOL 3350 17 G PO PACK: 17.0000 g | PACK | Freq: Two times a day (BID) | ORAL | Status: AC

## 2024-01-20 MED ORDER — BISACODYL 5 MG PO TBEC: 5.0000 mg | DELAYED_RELEASE_TABLET | Freq: Every day | ORAL | Status: AC | PRN

## 2024-01-20 NOTE — Discharge Summary (Signed)
 Physician Discharge Summary  Patient: Sarah Potts FMW:[id number] DOB: 15-Jan-1933   Code Status: Limited: Do not attempt resuscitation (DNR) -DNR-LIMITED -Do Not Intubate/DNI  Admit date: 01/15/2024 Discharge date: 01/20/2024 Disposition: Skilled nursing facility, PT, OT, nurse aid, and RN PCP: Stanton Lynwood FALCON, MD  Recommendations for Outpatient Follow-up:  Follow up with PCP within 1-2 weeks Regarding general hospital follow up and preventative care  Discharge Diagnoses:  Principal Problem:   Back pain Active Problems:   Fall   Sciatica associated with disorder of lumbar spine   Acute on chronic low back pain  Brief Hospital Course Summary: Sarah Potts is a 89 y.o. female with medical history significant of lumbar stenosis, sciatica, HTN, PAF on Xarelto , CAD status post stenting, chronic HFpEF, IIDM, CKD stage IIIb, anxiety/depression, presented with uncontrolled back pain and right leg pain. Patient has chronic pain causing ambulation impairment, she uses a rolling walker typically.  She receives periodic steroid injections with the last injection 1 month prior to arrival.  She presented to the ED for acute worsening and CT scan was negative for fracture or dislocation so she was discharged home.  She had difficulty controlling her pain so she return to the ED on 10/3.  MRI lumbar spine: Mostly chronic changes, worsening L4-L5 endplate degeneration. She was admitted for pain management and physical therapy.  She did well with physical therapy but remains too debilitated to return home unassisted.  She is now recommended to go to SNF. Pain management has been ongoing and is now treated adequately with oral agents for discharge to SNF.   Acute on chronic back pain Ambulation impairment Worsening right leg sciatica Chronic lumbar stenosis multiple levels - No focal neurologic deficit - MRI lumbar spine: Mostly chronic changes, worsening L4-L5 endplate degeneration - Continue with  current pain regimen, requiring IV pain meds.  Will continue to taper as tolerated.  S/p prednisone taper. - Continue with PT/OT.  Currently requiring assistance for all ADLs - Neurosurgery follow-up outpatient for steroid injections, unfortunately not a surgical candidate given age and comorbidities   Chronic heart failure preserved EF Hypertension - Currently euvolemic - Continue home meds, titrate as needed   Paroxysmal atrial fibrillation - On Amio and Xarelto .  Continue.   Diabetes - On sliding scale insulin  inpatient given hyperglycemia with steroid therapy but transitioned back to home oral therapy now that steroids have been discontinued.  - Continue to monitor glucose closely.  Acute worsening secondary to steroid therapy. - Hemoglobin A1c 6.7%.  At baseline I do not anticipate she will require insulin .     GERD - Continue PPI   CKD stage IIIb - Creatinine appears to be near baseline at this time - Continue with current meds   Hyponatremia - Monitor closely   Elevated LFTs - AST 49, total bili 1.5.  No clear etiology for this.  Repeat CMP shows complete resolution.  All other chronic conditions were treated with home medications.    Discharge Condition: Stable, improved Recommended discharge diet: Regular healthy diet 3x per week colostomy changes  Consultations: None   Procedures/Studies: None   Allergies as of 01/20/2024       Reactions   Levofloxacin Nausea Only   Patient reports that she thinks that she could take this medication. Patient reports that she thinks that she could take this medication.     Patient reports that she thinks that she could take this medication.   Niacin Other (See Comments)   Hot, Flushed  Pt states she get real red and hot.    Hot, Flushed   Niacin And Related Other (See Comments)   Pt states she get real red and hot.        Medication List     STOP taking these medications    lidocaine  5 % Commonly known as:  Lidoderm        TAKE these medications    acetaminophen  650 MG CR tablet Commonly known as: Acetaminophen  8 Hour Take 1 tablet (650 mg total) by mouth every 8 (eight) hours as needed for pain.   albuterol  108 (90 Base) MCG/ACT inhaler Commonly known as: VENTOLIN  HFA Inhale 1 puff into the lungs every 4 (four) hours as needed.   amiodarone  200 MG tablet Commonly known as: PACERONE  TAKE 1 TABLET BY MOUTH EVERY DAY   amLODipine  2.5 MG tablet Commonly known as: NORVASC  Take 2.5 mg by mouth daily.   atorvastatin  40 MG tablet Commonly known as: LIPITOR TAKE 1 TABLET (40 MG TOTAL) BY MOUTH IN THE MORNING. FOR CHOLESTEROL.   bisacodyl 5 MG EC tablet Commonly known as: DULCOLAX Take 1 tablet (5 mg total) by mouth daily as needed for moderate constipation.   Cholecalciferol  25 MCG (1000 UT) tablet Take 1,000 Units by mouth daily.   dorzolamide-timolol 2-0.5 % ophthalmic solution Commonly known as: COSOPT Place 1 drop into the left eye 3 (three) times daily.   fexofenadine 60 MG tablet Commonly known as: ALLEGRA Take 60 mg by mouth daily.   Fish Oil 1000 MG Caps Take 1,000 mg by mouth in the morning and at bedtime.   levothyroxine  25 MCG tablet Commonly known as: SYNTHROID  Take 25 mcg by mouth daily.   losartan  50 MG tablet Commonly known as: COZAAR  Take 1 tablet (50 mg total) by mouth daily.   meclizine  25 MG tablet Commonly known as: ANTIVERT  Take 1 tablet (25 mg total) by mouth every 6 (six) hours as needed.   metFORMIN  500 MG tablet Commonly known as: GLUCOPHAGE  Take 1 tablet by mouth 2 (two) times daily with a meal.   metoprolol  succinate 25 MG 24 hr tablet Commonly known as: TOPROL -XL Take 1 tablet (25 mg total) by mouth daily. Take with or immediately following a meal.   montelukast  10 MG tablet Commonly known as: SINGULAIR  Take 10 mg by mouth at bedtime.   nitroGLYCERIN  0.4 MG SL tablet Commonly known as: NITROSTAT  Place 1 tablet (0.4 mg total) under  the tongue every 5 (five) minutes as needed for chest pain.   omeprazole 40 MG capsule Commonly known as: PRILOSEC Take 40 mg by mouth daily.   oxyCODONE  5 MG immediate release tablet Commonly known as: Oxy IR/ROXICODONE  Take 1 tablet (5 mg total) by mouth every 6 (six) hours as needed for up to 5 days for moderate pain (pain score 4-6).   pioglitazone 30 MG tablet Commonly known as: ACTOS Take 30 mg by mouth daily.   polyethylene glycol 17 g packet Commonly known as: MIRALAX / GLYCOLAX Take 17 g by mouth 2 (two) times daily.   pregabalin  100 MG capsule Commonly known as: LYRICA  Take 100 mg by mouth 2 (two) times daily.   sertraline  50 MG tablet Commonly known as: ZOLOFT  Take 50 mg by mouth daily.   Trulicity 1.5 MG/0.5ML Soaj Generic drug: Dulaglutide Inject into the skin once a week.   Xarelto  15 MG Tabs tablet Generic drug: Rivaroxaban  TAKE 1 TABLET (15 MG TOTAL) BY MOUTH DAILY.  Contact information for after-discharge care     Destination     [facility] [place], [place] [place]   Service: Skilled Nursing Contact information: [address] [place]  [address] [phone]                     Subjective   Pt reports having continued pain on Right lower back radiating down leg. Needs colostomy bag changes 3x/week.   All questions and concerns were addressed at time of discharge.  Objective  Blood pressure (!) 146/66, pulse (!) 59, temperature 97.7 F (36.5 C), resp. rate 18, height 5' 2 (1.575 m), weight 82.6 kg, SpO2 98%.   General: Pt is alert, awake, not in acute distress Cardiovascular: RRR, S1/S2 +, no rubs, no gallops Respiratory: CTA bilaterally, no wheezing, no rhonchi Abdominal: Soft, NT, ND, bowel sounds + Extremities: no edema, no cyanosis  The results of significant diagnostics from this hospitalization (including imaging, microbiology, ancillary and laboratory) are listed below for reference.   Imaging  studies: MR LUMBAR SPINE WO CONTRAST Result Date: 01/15/2024 EXAM: MRI LUMBAR SPINE 01/15/2024 03:23:00 PM TECHNIQUE: Multiplanar multisequence MRI of the lumbar spine was performed without the administration of intravenous contrast. COMPARISON: None available. CLINICAL HISTORY: Lumbar radiculopathy, trauma from fall, pain and numbness in both legs. FINDINGS: BONES AND ALIGNMENT: Normal alignment except for 4 mm degenerative anterolisthesis at L3-L4 and mild dextroconvex lumbar scoliosis. Chronic 50 percent anterior compression fracture at L3, not changed from previous. Small subcortical bands of edema anteriorly along the inferior endplate of L4 and posteriorly along the superior endplate of L5 are new compared to the prior exam. Given the subtlety of the findings on inversion recovery weighted images, I favored degenerative endplate findings over early/subtle endplate fractures, although the pattern is unusual. Adjacent intervertebral disc appears normal. SPINAL CORD: The conus terminates normally at the T12-L1 level. SOFT TISSUES: No paraspinal mass. Nonspecific mild presacral edema. T12-L1: Unremarkable. L1-L2: No impingement. Disc bulge and small central disc protrusion. L2-L3: Moderate left eccentric central stenosis and moderate left foraminal stenosis due to left paracentral and lateral recess disc protrusion, intervertebral spurring, facet arthropathy. Similar to prior. L3-L4: Moderate central stenosis due to disc bulge, disc uncovering, facet arthropathy, and ligamentum flavum redundancy. Mildly worsened from prior. L4-L5: Moderate right and borderline left foraminal stenosis due to disc bulge, intervertebral spurring, right foraminal disc protrusion, and facet arthropathy. Similar to prior. L5-S1: Moderate bilateral foraminal stenosis due to disc bulge, right foraminal disc protrusion, and facet arthropathy. Similar to prior. IMPRESSION: 1. Moderate central stenosis at L3-4, mildly worsened from prior.  2. Moderate left eccentric central stenosis and moderate left foraminal stenosis at L2-3, similar to prior. 3. Moderate bilateral foraminal stenosis at L5-S1, similar to prior. 4. Moderate right foraminal stenosis at L4-5, similar to prior. 5. Small subcortical bands of edema at L4 and L5 endplates, new compared to prior exam, favored to represent degenerative endplate findings. 6. Chronic 50% anterior compression fracture at L3, unchanged. Electronically signed by: Ryan Salvage MD 01/15/2024 03:40 PM EDT RP Workstation: [id number]   DG HIPS BILAT WITH PELVIS MIN 5 VIEWS Result Date: 01/15/2024 EXAM: 5 OR MORE VIEW(S) XRAY OF THE BILATERAL HIP 01/15/2024 12:52:39 PM COMPARISON: None available. CLINICAL HISTORY: Fall. FINDINGS: BONES AND JOINTS: No acute fracture or focal osseous lesion. The hip joint is maintained. Mild spurring of both femoral heads and acetabulum with suspected chondrocalcinosis along the acetabular labrum bilaterally. Degenerative spurring of the pubis. Bony demineralization. SOFT TISSUES: Atheromatous vascular  calcifications. LUMBAR SPINE: Interbody spurring at L4-L5. IMPRESSION: 1. No acute abnormalities. 2. Mild degenerative spurring of both femoral heads and acetabula with suspected bilateral acetabular labral chondrocalcinosis. 3. Degenerative spurring of the pubic symphysis. 4. Interbody degenerative spurring at L4-5. Electronically signed by: Ryan Salvage MD 01/15/2024 01:16 PM EDT RP Workstation: [id number]   CT Cervical Spine Wo Contrast Result Date: 01/13/2024 CLINICAL DATA:  Un witnessed fall, hit head EXAM: CT CERVICAL SPINE WITHOUT CONTRAST TECHNIQUE: Multidetector CT imaging of the cervical spine was performed without intravenous contrast. Multiplanar CT image reconstructions were also generated. RADIATION DOSE REDUCTION: This exam was performed according to the departmental dose-optimization program which includes automated exposure control, adjustment of the mA  and/or kV according to patient size and/or use of iterative reconstruction technique. COMPARISON:  03/06/2023 FINDINGS: Alignment: Stable straightening of cervical spine due to multilevel degenerative changes. Mild degenerative retrolisthesis of C4 relative to C3 unchanged. Skull base and vertebrae: No acute fracture. No primary bone lesion or focal pathologic process. Soft tissues and spinal canal: No prevertebral fluid or swelling. No visible canal hematoma. Disc levels: Stable multilevel spondylosis and facet hypertrophy, most pronounced at C5-6 and C6-7. Upper chest: Airway is patent. Visualized portions of the lung apices are clear. Other: Reconstructed images demonstrate no additional findings. IMPRESSION: 1. Stable multilevel cervical degenerative changes. No acute cervical spine fracture. Electronically Signed   By: Ozell Daring M.D.   On: 01/13/2024 17:52   CT Head Wo Contrast Result Date: 01/13/2024 CLINICAL DATA:  I witnessed fall this morning, anticoagulated EXAM: CT HEAD WITHOUT CONTRAST TECHNIQUE: Contiguous axial images were obtained from the base of the skull through the vertex without intravenous contrast. RADIATION DOSE REDUCTION: This exam was performed according to the departmental dose-optimization program which includes automated exposure control, adjustment of the mA and/or kV according to patient size and/or use of iterative reconstruction technique. COMPARISON:  06/28/2023 FINDINGS: Brain: No acute infarct or hemorrhage. Lateral ventricles and midline structures are unremarkable. No acute extra-axial fluid collections. No mass effect. Vascular: No hyperdense vessel or unexpected calcification. Skull: Normal. Negative for fracture or focal lesion. Sinuses/Orbits: No acute finding. Other: None. IMPRESSION: 1. No acute intracranial process. Electronically Signed   By: Ozell Daring M.D.   On: 01/13/2024 17:48   CT Lumbar Spine Wo Contrast Result Date: 01/13/2024 EXAM: CT OF THE LUMBAR  SPINE WITHOUT CONTRAST 01/13/2024 05:21:09 PM TECHNIQUE: CT of the lumbar spine was performed without the administration of intravenous contrast. Multiplanar reformatted images are provided for review. Automated exposure control, iterative reconstruction, and/or weight based adjustment of the mA/kV was utilized to reduce the radiation dose to as low as reasonably achievable. COMPARISON: 07/30/2022 CLINICAL HISTORY: Low back pain, trauma. Unwitnessed fall this morning. Hit head on wall. Denies LOC. Takes blood thinner. FINDINGS: BONES AND ALIGNMENT: Normal vertebral body heights, except for L3. Unchanged chronic superior endplate compression fracture at L3. No acute fracture or acute subluxation identified. Mild dextroconvex lumbar scoliosis. The lowest lumbar type non-weightbearing vertebra is labeled as L5. DEGENERATIVE CHANGES: L1-L2: Borderline right foraminal stenosis due to disc osteophyte complex. L2-L3: Moderate central stenosis and moderate left foraminal stenosis due to disc osteophyte complex and facet arthropathy. L3-L4: Moderate central stenosis and moderate left foraminal stenosis due to disc osteophyte complex and facet arthropathy. L4-L5: Moderate bilateral foraminal stenosis due to disc osteophyte complex and facet arthropathy. L5-S1: Moderate bilateral foraminal stenosis due to disc osteophyte complex. SOFT TISSUES: Systemic atherosclerosis is present, including the aorta and iliac arteries. Umbilical hernia contains  adipose tissue. Left ostomy site noted. No acute abnormality. IMPRESSION: 1. No acute fracture or acute subluxation. 2. Moderate central canal stenosis at L2-L3 and L3-L4. 3. Moderate left foraminal stenosis at L2-L3 and L3-L4. 4. Moderate bilateral foraminal stenosis at L4-L5 and L5-S1. 5. Unchanged chronic superior endplate compression fracture at L3. Electronically signed by: Ryan Salvage MD 01/13/2024 05:36 PM EDT RP Workstation: [id number]    Labs: Basic Metabolic  Panel: Recent Labs  Lab 01/15/24 1651 01/16/24 1015 01/17/24 0948  NA 136 131* 136  K 4.1 5.0 4.5  CL 105 97* 99  CO2 24 22 24   GLUCOSE 128* 182* 145*  BUN 14 21 29*  CREATININE 1.23* 1.28* 1.33*  CALCIUM  8.8* 8.6* 9.3   CBC: Recent Labs  Lab 01/15/24 1651 01/16/24 1015 01/17/24 0948  WBC 6.6 7.7 7.6  NEUTROABS 4.9 6.3 5.9  HGB 10.6* 9.9* 10.1*  HCT 32.6* 30.4* 30.2*  MCV 96.2 97.1 95.3  PLT 211 245 277   Microbiology: Results for orders placed or performed during the hospital encounter of 12/20/21  Urine Culture     Status: None   Collection Time: 12/20/21  5:37 PM   Specimen: Urine, Suprapubic  Result Value Ref Range Status   Specimen Description   Final    URINE, SUPRAPUBIC Performed at [facility], [address], [place], [place] [address]    Special Requests   Final    NONE Performed at [facility], [address] [place], [place] [address]    Culture   Final    NO GROWTH Performed at [facility], [address] [place], [place] [address]    Report Status 12/22/2021 FINAL  Final    Time coordinating discharge: Over 30 minutes  Marien LITTIE Piety, MD  [facility] 01/20/2024, 9:22 AM

## 2024-01-20 NOTE — Progress Notes (Incomplete)
 PROGRESS NOTE    [name]  FMW:[id number] DOB: 04-13-1933 DOA: 01/15/2024 PCP: Stanton Lynwood FALCON, MD  Chief Complaint  Patient presents with   [facility] Course:  Sarah Potts is a 89 y.o. female with medical history significant of lumbar stenosis, sciatica, HTN, PAF on Xarelto , CAD status post stenting, chronic HFpEF, IIDM, CKD stage IIIb, anxiety/depression, presented with uncontrolled back pain and right leg pain. Patient has chronic pain causing ambulation impairment, she uses a rolling walker typically.  She receives periodic steroid injections with the last injection 1 month prior to arrival.  She presented to the ED for acute worsening and CT scan was negative for fracture or dislocation so she was discharged home.  She is a difficulty controlling her pain so she return to the ED on 10/3.  She was admitted for pain management and physical therapy.  She did well with physical therapy but remains too debilitated to return home unassisted.  She is now pending SNF placement  Subjective: No acute complaints today.  Patient's pain is better controlled.  She continues to do well with PT team  Objective: Vitals:   01/19/24 0736 01/19/24 1521 01/19/24 1945 01/20/24 0500  BP: (!) 152/59 (!) 151/48 (!) 156/58 (!) 169/66  Pulse: (!) 59 (!) 58 60 (!) 59  Resp: 15 16 16 17   Temp: 98 F (36.7 C) 98 F (36.7 C) 97.8 F (36.6 C) 97.7 F (36.5 C)  TempSrc:      SpO2: 97% 93% 95% 96%  Weight:      Height:        Intake/Output Summary (Last 24 hours) at 01/20/2024 0749 Last data filed at 01/20/2024 0521 Gross per 24 hour  Intake 600 ml  Output 1400 ml  Net -800 ml   Filed Weights   01/15/24 1056 01/15/24 1103  Weight: 82.6 kg 82.6 kg    Examination: General exam: Appears calm and comfortable Respiratory system: No work of breathing, symmetric chest wall expansion Cardiovascular system: S1 & S2 heard, RRR.  Gastrointestinal system: Abdomen is nondistended, soft and  nontender.  Neuro: Alert and oriented.  Psychiatry: Demonstrates appropriate judgement and insight. Mood & affect appropriate for situation.   Assessment & Plan:  Principal Problem:   Back pain Active Problems:   Fall   Sciatica associated with disorder of lumbar spine   Acute on chronic low back pain  {Tip this will not be part of the note when signed Body mass index is 33.29 kg/m. , ,  (Optional):26781} Acute on chronic back pain Ambulation impairment Worsening right leg sciatica Chronic lumbar stenosis multiple levels - No focal neurologic deficit - MRI lumbar spine: Mostly chronic changes, worsening L4-L5 endplate degeneration - Continue with current pain regimen, requiring IV pain meds.  Will continue to taper as tolerated.  Discontinue prednisone today - Continue with PT/OT.  Currently requiring assistance for all ADLs - Pending SNF placement - Neurosurgery follow-up outpatient for steroid injections, unfortunately not a surgical candidate given age and comorbidities  Chronic heart failure preserved EF Hypertension - Currently euvolemic - Continue home meds, titrate as needed  Paroxysmal atrial fibrillation - On Amio and Xarelto .  Continue.  Diabetes - On sliding scale insulin  for now given steroid therapy - Continue to monitor glucose closely.  Currently at goal.  Acute worsening secondary to steroid therapy. - Discontinued prednisone today - Hemoglobin A1c 6.7%.  At baseline I do not anticipate she will require insulin .    GERD -  Continue PPI  CKD stage IIIb - Creatinine appears to be near baseline at this time - Continue with current meds  Hyponatremia - Monitor closely  Elevated LFTs - AST 49, total bili 1.5.  No clear etiology for this.  Repeat CMP shows complete resolution.  DVT prophylaxis: Renally dosed Xarelto    Code Status: Limited: Do not attempt resuscitation (DNR) -DNR-LIMITED -Do Not Intubate/DNI  Disposition:  Remains in house for pain  control and PT, will require SNF at DC.  TOC consulted to assist in arrangements  Consultants:    Procedures:    Antimicrobials:  Anti-infectives (From admission, onward)    None       Data Reviewed: I have personally reviewed following labs and imaging studies CBC: Recent Labs  Lab 01/15/24 1651 01/16/24 1015 01/17/24 0948  WBC 6.6 7.7 7.6  NEUTROABS 4.9 6.3 5.9  HGB 10.6* 9.9* 10.1*  HCT 32.6* 30.4* 30.2*  MCV 96.2 97.1 95.3  PLT 211 245 277   Basic Metabolic Panel: Recent Labs  Lab 01/15/24 1651 01/16/24 1015 01/17/24 0948  NA 136 131* 136  K 4.1 5.0 4.5  CL 105 97* 99  CO2 24 22 24   GLUCOSE 128* 182* 145*  BUN 14 21 29*  CREATININE 1.23* 1.28* 1.33*  CALCIUM  8.8* 8.6* 9.3   GFR: Estimated Creatinine Clearance: 28.6 mL/min (A) (by C-G formula based on SCr of 1.33 mg/dL (H)). Liver Function Tests: Recent Labs  Lab 01/16/24 1015 01/17/24 0948  AST 49* 33  ALT 24 24  ALKPHOS 58 53  BILITOT 1.5* 0.7  PROT 6.5 6.6  ALBUMIN 2.7* 2.8*   CBG: Recent Labs  Lab 01/18/24 2022 01/19/24 0803 01/19/24 1133 01/19/24 1612 01/19/24 2109  GLUCAP 250* 126* 207* 327* 300*    No results found for this or any previous visit (from the past 240 hours).   Radiology Studies: No results found.   Scheduled Meds:  amiodarone   200 mg Oral Daily   amLODipine   2.5 mg Oral Daily   atorvastatin   40 mg Oral Daily   capsaicin   Topical BID   insulin  aspart  0-9 Units Subcutaneous TID WC   levothyroxine   25 mcg Oral Daily   loratadine  10 mg Oral Daily   losartan   50 mg Oral Daily   metoprolol  succinate  25 mg Oral Daily   montelukast   10 mg Oral QHS   pantoprazole   40 mg Oral Daily   Rivaroxaban   15 mg Oral Q supper   sertraline   50 mg Oral Daily   Continuous Infusions:   LOS: 2 days  MDM: Patient is high risk for one or more organ failure.  They necessitate ongoing hospitalization for continued IV therapies and subsequent lab monitoring. Total time spent  interpreting labs and vitals, reviewing the medical record, coordinating care amongst consultants and care team members, directly assessing and discussing care with the patient and/or family: 55 min  Bellamie Turney LITTIE Piety, DO [facility]  To contact the attending physician between 7A-7P please use Epic Chat. To contact the covering physician during after hours 7P-7A, please review Amion.  01/20/2024, 7:49 AM   *This document has been created with the assistance of dictation software. Please excuse typographical errors. *

## 2024-01-20 NOTE — Plan of Care (Signed)

## 2024-01-20 NOTE — Plan of Care (Signed)
   Problem: Education: Goal: Ability to describe self-care measures that may prevent or decrease complications (Diabetes Survival Skills Education) will improve Outcome: Progressing

## 2024-01-20 NOTE — Discharge Planning (Signed)
 Discharge given to [facility], all questions answered. Patient packed, IV removed Patient took all belongings with her

## 2024-02-05 ENCOUNTER — Ambulatory Visit: Attending: Cardiology | Admitting: Cardiology

## 2024-02-05 NOTE — Progress Notes (Deleted)
 Cardiology Office Note   Date:  02/05/2024  ID:  Sarah, Potts [birth year], MRN [id number] PCP: Valora Lynwood FALCON, MD  [facility] Providers Cardiologist:  Evalene Lunger, MD { Click to update primary MD,subspecialty MD or APP then REFRESH:1}    History of Present Illness Sarah Potts is a 89 y.o. female with past medical history of paroxysmal atrial fibrillation, type 2 diabetes, hyperlipidemia, primary pretension, CKD stage III, coronary disease status post PCI, chronic HFpEF, asthma, and anal cancer status post colostomy, who is here today for follow-up after recent hospital discharge.   Coronary disease with PCI to the left circumflex in December 2019 in the setting of a non-STEMI while in [place] .  Most recent left heart catheterization September 2015 in the setting of NSTEMI with EF of 40 to 25%.  Cath revealed 30% ostial RCA, 40% proximal RCA, 20% distal RCA, left circumflex mid to distal 10%, proximal left circumflex 40%, proximal to mid LAD 50%, ostial proximal LAD 40%.  Mild to moderate nonobstructive CAD with patent stents, moderate to severely calcified coronary arteries, suspected stress-induced cardiomyopathy.  Repeat echocardiogram in January 2024 revealed LVEF of 60 to 65%.  Longstanding history of atrial fibrillation diagnosed in 2016 in [place]  had been on chronic rivaroxaban , amiodarone  and metoprolol .  She was hospitalized at [facility] 3/16 - 07/01/2023 with a recent NSTEMI.  She presented to the hospital with generalized weakness, dizziness, double vision, and unsteady gait.  She was recently evaluated in the emergency department 07/02/2023 and was treated with a Z-Pak and doxycycline .  High-sensitivity troponins peaked at 162.  She was treated for an acute NSTEMI with IV heparin  for total of 48 hours.  She underwent nuclear stress testing 3/15 that showed normal LV perfusion was considered low risk study.  Echocardiogram revealed LVEF 55-60%, G1 DD, moderate LVH, mitral  valve and aortic valve calcifications without stenosis.  Rivaroxaban  was to be restarted at discharge.  She was considered stable and discharged from the facility on 07/01/2023.  She was seen in clinic 07/10/2023 by Dr. Gollan with complaint of dizziness, shortness of breath with overexertion and twinges in her chest.  Lipitor was increased to 40 mg daily, rivaroxaban  15 mg was continued.  She was restarted on metoprolol  succinate 25 mg daily and losartan  50 mg daily.  Will avoid restarting amlodipine  given prior history of leg swelling.  She was seen in clinic in April 2025 and she had called the nurse triage line due to significantly elevated blood pressure.  Overall she had been doing well and her blood pressures been extremely elevated she was unsure as to why.  She did recently have medication changes made during her hospitalization.  She was last seen in clinic 01/04/2024 stating overall cardiac perspective she was doing well.  She had a cough since about August and had been congested.  She had previously been on 2 rounds of antibiotics and prednisone with no relief.  PCR swabs negative for COVID/flu/RSV.  She continued to have shortness of breath due to cough.  She was sent for updated labs.  There were no medication changes that were made and she was to return in 4 weeks for further evaluation.   Patient was recently hospitalized from 10/3 - 01/20/2024 and [facility] status post fall with ongoing complaints of back pain and sciatica associated with disorder of the lumbar spine.  She had chronic pain causing issues with ambulation.  She typically uses a rollator.  She receives periodic steroid injection with  last in just 1 month prior to arrival to the emergency department.  She presented to the emergency department for acute worsening pain after a fall.  CT scan was negative for fracture or dislocation and she was discharged home.  She had difficulty controlling her pain at home so she returned to the  emergency department.  MRI of the lumbar spine revealed mostly chronic changes with worsening L4-L5 endplate degeneration.  She was admitted for pain management and physical therapy.  She did well with physical therapy but remained debilitated to return home unassisted.  She was recommended to SNF.  Pain management was ongoing and she was treated adequately with oral agents.  She returns clinic today  ROS: 10 point review of system was reviewed and considered negative except what was listed in HPI  Studies Reviewed      Lexiscan  MPI 06/30/2023   Normal pharmacologic myocardial perfusion stress test without evidence of significant ischemia or scar.   Left ventricular systolic function is normal (LVEF > 65%).   Coronary artery calcification/stent(s) and aortic atherosclerosis are noted on the attenuation correction CT.   This is a low-risk study.   2D echo 06/29/2023 1. Left ventricular ejection fraction, by estimation, is 55 to 60%. The  left ventricle has normal function. The left ventricle has no regional  wall motion abnormalities. There is moderate left ventricular hypertrophy.  Left ventricular diastolic  parameters are consistent with Grade I diastolic dysfunction (impaired  relaxation). The global longitudinal strain is abnormal.   2. Right ventricular systolic function is normal. The right ventricular  size is normal. There is normal pulmonary artery systolic pressure.   3. Left atrial size was mildly dilated.   4. The mitral valve is abnormal. No evidence of mitral valve  regurgitation. No evidence of mitral stenosis. Severe mitral annular  calcification.   5. The aortic valve is normal in structure. Aortic valve regurgitation is  not visualized. Aortic valve sclerosis/calcification is present, without  any evidence of aortic stenosis.   6. The inferior vena cava is normal in size with greater than 50%  respiratory variability, suggesting right atrial pressure of 3 mmHg.     Risk Assessment/Calculations  CHA2DS2-VASc Score = 7  {Confirm score is correct.  If not, click here to update score.  REFRESH note.  :1} This indicates a 11.2% annual risk of stroke. The patient's score is based upon: CHF History: 1 HTN History: 1 Diabetes History: 1 Stroke History: 0 Vascular Disease History: 1 Age Score: 2 Gender Score: 1   {This patient has a significant risk of stroke if diagnosed with atrial fibrillation.  Please consider VKA or DOAC agent for anticoagulation if the bleeding risk is acceptable.   You can also use the SmartPhrase .[email or web address] for documentation.   :[id number]} No BP recorded.  {Refresh Note OR Click here to enter BP  :1}***       Physical Exam VS:  There were no vitals taken for this visit.       Wt Readings from Last 3 Encounters:  01/15/24 182 lb (82.6 kg)  01/13/24 182 lb (82.6 kg)  01/05/24 184 lb 6.4 oz (83.6 kg)    GEN: Well nourished, well developed in no acute distress NECK: No JVD; No carotid bruits CARDIAC: ***RRR, no murmurs, rubs, gallops RESPIRATORY:  Clear to auscultation without rales, wheezing or rhonchi  ABDOMEN: Soft, non-tender, non-distended EXTREMITIES:  No edema; No deformity   ASSESSMENT AND PLAN HFimpEF Coronary artery disease with stable angina with  last ablation in 2019 to the left circumflex with a repeat catheterization in 2023 revealing nonobstructive disease. Primary hypertension Paroxysmal atrial fibrillation CKD stage III Mixed hyperlipidemia Type 2 diabetes    {Are you ordering a CV Procedure (e.g. stress test, cath, DCCV, TEE, etc)?   Press F2        :[id number]}  Dispo: ***  Signed, Keylin Ferryman, NP

## 2024-02-08 ENCOUNTER — Encounter: Payer: Self-pay | Admitting: Cardiology

## 2024-02-08 ENCOUNTER — Other Ambulatory Visit: Payer: Self-pay | Admitting: Family Medicine

## 2024-02-08 DIAGNOSIS — M5416 Radiculopathy, lumbar region: Secondary | ICD-10-CM

## 2024-02-09 ENCOUNTER — Ambulatory Visit

## 2024-02-10 ENCOUNTER — Ambulatory Visit
Admission: RE | Admit: 2024-02-10 | Discharge: 2024-02-10 | Disposition: A | Discharge status: Home / Self Care | Source: Ambulatory Visit | Attending: Family Medicine | Admitting: Family Medicine

## 2024-02-10 DIAGNOSIS — M5416 Radiculopathy, lumbar region: Secondary | ICD-10-CM | POA: Insufficient documentation

## 2024-02-16 ENCOUNTER — Encounter: Payer: Self-pay | Admitting: Family Medicine

## 2024-02-16 ENCOUNTER — Other Ambulatory Visit: Payer: Self-pay | Admitting: Family Medicine

## 2024-02-16 DIAGNOSIS — S32040G Wedge compression fracture of fourth lumbar vertebra, subsequent encounter for fracture with delayed healing: Secondary | ICD-10-CM

## 2024-02-18 ENCOUNTER — Inpatient Hospital Stay: Admission: RE | Admit: 2024-02-18 | Discharge: 2024-02-18 | Attending: Family Medicine | Admitting: Family Medicine

## 2024-02-18 ENCOUNTER — Telehealth: Payer: Self-pay | Admitting: Cardiovascular Disease

## 2024-02-18 DIAGNOSIS — S32040G Wedge compression fracture of fourth lumbar vertebra, subsequent encounter for fracture with delayed healing: Secondary | ICD-10-CM

## 2024-02-18 HISTORY — PX: IR RADIOLOGIST EVAL & MGMT: IMG5224

## 2024-02-18 NOTE — Telephone Encounter (Signed)
   Pre-operative Risk Assessment    Patient Name: Sarah Potts  DOB: October 29, 1932 MRN: [id number]   Date of last office visit: unknown Date of next office visit: unknown   Request for Surgical Clearance    Procedure:  Kyphoplasty  Date of Surgery:  Clearance TBD                                Surgeon:  TBD Surgeon's Group or Practice Name:  [facility] Phone number:  [phone] Fax number:  [phone]   Type of Clearance Requested:   - Pharmacy:  Hold Rivaroxaban  (Xarelto ) hold for 2 doses   Type of Anesthesia:  Not Indicated   Additional requests/questions:    Bonney Berwyn LELON Jackson   02/18/2024, 4:36 PM

## 2024-02-18 NOTE — Consult Note (Addendum)
 Chief Complaint: Patient was seen in consultation today for low back pain at the request of Meeler,Whitney L  Referring Physician(s): Meeler,Whitney L  History of Present Illness: Sarah Potts is a 89 y.o. female Who was in her usual state of health until she suffered a fall from standing at the beginning of October.  She presented to the emergency department and an initial MRI was obtained on 01/15/2024.  This demonstrated no convincing evidence of fracture at the time.  Patient was treated conservatively and ultimately underwent physical therapy and rehabilitation but continued to develop worsening lower back pain.  Ultimately, a second MRI was obtained on 02/10/2024 and demonstrated acute to subacute compression fractures at L4 with approximately 40% height loss and a superior endplate compression fracture of L5 without significant height loss.  Patient has been undergoing continuous rest and conservative management with oxycodone .    Despite several weeks of conservative management, she continues to have severe 9 out of 10 pain and remains significantly disabled.  She scored a 20 out of 24 on the [employer] disability questionnaire.  Her symptoms are quite significant and she is anxious to undergo any treatment that could alleviate her symptoms.  Past Medical History:  Diagnosis Date   Anal cancer (HCC)    Anemia    Asthma    CHF (congestive heart failure) (HCC)    Diabetes mellitus    Hyperlipidemia    Hypertension     Past Surgical History:  Procedure Laterality Date   BLADDER SURGERY     CARDIAC CATHETERIZATION     COLOSTOMY     CORONARY ANGIOPLASTY WITH STENT PLACEMENT  03/25/2018   Stent to the LCX  2.50 mm x 16mm  [facility] REF [id number]   heart stent     IR RADIOLOGIST EVAL & MGMT  02/18/2024   LEFT HEART CATH AND CORONARY ANGIOGRAPHY N/A 12/23/2021   Procedure: LEFT HEART CATH AND CORONARY ANGIOGRAPHY;  Surgeon: Darron Deatrice LABOR, MD;  Location: [facility] INVASIVE CV LAB;  Service: Cardiovascular;  Laterality: N/A;   TONSILLECTOMY     uterus surgery     removed    Allergies: Levofloxacin, Niacin, and Niacin and related  Medications: Prior to Admission medications   Medication Sig Start Date End Date Taking? Authorizing Provider  acetaminophen  (ACETAMINOPHEN  8 HOUR) 650 MG CR tablet Take 1 tablet (650 mg total) by mouth every 8 (eight) hours as needed for pain. 01/13/24   Janit Kast, PA-C  albuterol  (VENTOLIN  HFA) 108 (90 Base) MCG/ACT inhaler Inhale 1 puff into the lungs every 4 (four) hours as needed. 11/29/22   [provider]  amiodarone  (PACERONE ) 200 MG tablet TAKE 1 TABLET BY MOUTH EVERY DAY 09/11/23   Gollan, Timothy J, MD  amLODipine  (NORVASC ) 2.5 MG tablet Take 2.5 mg by mouth daily. 07/25/23   [provider]  atorvastatin  (LIPITOR) 40 MG tablet TAKE 1 TABLET (40 MG TOTAL) BY MOUTH IN THE MORNING. FOR CHOLESTEROL. 12/07/23   Gollan, Timothy J, MD  bisacodyl (DULCOLAX) 5 MG EC tablet Take 1 tablet (5 mg total) by mouth daily as needed for moderate constipation. 01/20/24   Lenon Marien CROME, MD  Cholecalciferol  25 MCG (1000 UT) tablet Take 1,000 Units by mouth daily.    [provider]  dorzolamide-timolol (COSOPT) 2-0.5 % ophthalmic solution Place 1 drop into the left eye 3 (three) times daily.    [provider]  fexofenadine (ALLEGRA) 60 MG tablet Take 60 mg  by mouth daily. 12/18/22   [provider]  levothyroxine  (SYNTHROID ) 25 MCG tablet Take 25 mcg by mouth daily.    [provider]  losartan  (COZAAR ) 50 MG tablet Take 1 tablet (50 mg total) by mouth daily. 07/10/23   Gollan, Timothy J, MD  meclizine  (ANTIVERT ) 25 MG tablet Take 1 tablet (25 mg total) by mouth every 6 (six) hours as needed. 07/10/23   Gollan, Timothy J, MD  metFORMIN  (GLUCOPHAGE ) 500 MG tablet Take 1 tablet by mouth 2 (two) times daily with a meal. 10/29/15   [provider]  metoprolol  succinate (TOPROL -XL) 25 MG 24 hr tablet Take 1 tablet (25 mg total) by mouth daily. Take with or immediately following a meal. 07/10/23   Gollan, Timothy J, MD  montelukast  (SINGULAIR ) 10 MG tablet Take 10 mg by mouth at bedtime.      [provider]  nitroGLYCERIN  (NITROSTAT ) 0.4 MG SL tablet Place 1 tablet (0.4 mg total) under the tongue every 5 (five) minutes as needed for chest pain. 12/24/21   Patel, Sona, MD  Omega-3 Fatty Acids (FISH OIL) 1000 MG CAPS Take 1,000 mg by mouth in the morning and at bedtime.    [provider]  omeprazole (PRILOSEC) 40 MG capsule Take 40 mg by mouth daily.      [provider]  pioglitazone (ACTOS) 30 MG tablet Take 30 mg by mouth daily.    [provider]  polyethylene glycol (MIRALAX / GLYCOLAX) 17 g packet Take 17 g by mouth 2 (two) times daily. 01/20/24   Lenon Marien CROME, MD  pregabalin  (LYRICA ) 100 MG capsule Take 100 mg by mouth 2 (two) times daily. 12/28/23 12/27/24  [provider]  sertraline  (ZOLOFT ) 50 MG tablet Take 50 mg by mouth daily. 08/12/22   [provider]  TRULICITY 1.5 MG/0.5ML SOPN Inject into the skin once a week. 12/16/20   [provider]  XARELTO  15 MG TABS tablet TAKE 1 TABLET (15 MG TOTAL) BY MOUTH DAILY. 06/01/23   Gollan, Timothy J, MD     Family History  Problem Relation Age of Onset   Cancer Mother    Breast cancer Mother    Heart disease Father    Heart attack Father    Cancer Sister    Heart disease Sister    Cervical cancer Sister    Heart disease Brother     Social History   Socioeconomic History   Marital status: Single    Spouse name: Not on file   Number of children: Not on file   Years of education: Not on file   Highest education level: Not on file  Occupational History   Not on file  Tobacco Use   Smoking status: Never   Smokeless tobacco: Never  Vaping Use   Vaping status: Never Used  Substance and Sexual Activity    Alcohol use: No   Drug use: No   Sexual activity: Not on file  Other Topics Concern   Not on file  Social History Narrative   Not on file   Social Drivers of Health   Financial Resource Strain: Low Risk  (10/19/2023)   Received from [facility]   Overall Financial Resource Strain (CARDIA)    Difficulty of Paying Living Expenses: Not very hard  Food Insecurity: No Food Insecurity (01/16/2024)   Hunger Vital Sign    Worried About Running Out of Food in the Last Year: Never true    Ran  Out of Food in the Last Year: Never true  Transportation Needs: No Transportation Needs (01/16/2024)   PRAPARE - Administrator, Civil Service (Medical): No    Lack of Transportation (Non-Medical): No  Physical Activity: Not on file  Stress: Not on file  Social Connections: Socially Isolated (01/16/2024)   Social Connection and Isolation Panel    Frequency of Communication with Friends and Family: Never    Frequency of Social Gatherings with Friends and Family: Never    Attends Religious Services: Never    Database Administrator or Organizations: No    Attends Banker Meetings: Never    Marital Status: Widowed   Review of Systems: A 12 point ROS discussed and pertinent positives are indicated in the HPI above.  All other systems are negative.  Review of Systems  Vital Signs: BP (!) 172/76 (BP Location: Left Arm, Patient Position: Sitting, Cuff Size: Normal)   Pulse 67   Temp 97.8 F (36.6 C) (Oral)   Resp 16   Wt 81.6 kg   SpO2 97%   BMI 32.92 kg/m   Advance Care Plan: The advanced care plan/surrogate decision maker was discussed at the time of visit and the patient did not wish to discuss or was not able to name a surrogate decision maker or provide an advance care plan.    Physical Exam Constitutional:      General: She is not in acute distress.    Appearance: Normal appearance. She is obese.  HENT:     Head: Normocephalic and atraumatic.   Eyes:     General: No scleral icterus. Cardiovascular:     Rate and Rhythm: Normal rate.  Pulmonary:     Effort: Pulmonary effort is normal.  Abdominal:     General: There is no distension.     Tenderness: There is no abdominal tenderness. There is no guarding.  Musculoskeletal:       Back:     Comments: TTP at L4 and L5  Skin:    General: Skin is warm and dry.  Neurological:     Mental Status: She is alert and oriented to person, place, and time.  Psychiatric:        Mood and Affect: Mood normal.        Behavior: Behavior normal.       Imaging: IR Radiologist Eval & Mgmt Result Date: 02/18/2024 EXAM: NEW PATIENT OFFICE VISIT CHIEF COMPLAINT: SEE NOTE IN EPIC HISTORY OF PRESENT ILLNESS: SEE NOTE IN EPIC REVIEW OF SYSTEMS: SEE NOTE IN EPIC PHYSICAL EXAMINATION: SEE NOTE IN EPIC ASSESSMENT AND PLAN: SEE NOTE IN EPIC Electronically Signed   By: Wilkie Lent M.D.   On: 02/18/2024 13:48   MR LUMBAR SPINE WO CONTRAST Result Date: 02/10/2024 EXAM: MRI LUMBAR SPINE 02/10/2024 05:52:00 PM TECHNIQUE: Multiplanar multisequence MRI of the lumbar spine was performed without the administration of intravenous contrast. COMPARISON: Prior MRI 10/32/2005 and CT scan 01/13/2024. CLINICAL HISTORY: FINDINGS: BONES AND ALIGNMENT: Stable degenerative lumbar spondylosis with multilevel disc disease and facet disease. Stable degenerative retrolisthesis of L4. Acute or subacute compression fractures involving L4 and the superior endplate of L5. Estimated 40% compression deformity at L4 with minimal retropulsion. There is also a remote compression fracture of L3. Bone marrow signal is unremarkable. SPINAL CORD: The conus medullaris terminates at T12-L1. SOFT TISSUES: No significant paraspinal or retroperitoneal findings. T12-L1: Mild facet disease but no disc protrusions, spinal or foraminal stenosis. Minimal left lateral recess encroachment. L1-L2:  Stable mild diffuse bulging annulus and facet disease  with flattening of the ventral thecal sac. Mild left foraminal encroachment but no significant spinal or foraminal stenosis. L2-L3: Stable advanced degenerative disc disease and facet disease with a partially calcified disc protrusion. Stable mass effect on the ventral thecal sac with moderate spinal and bilateral lateral recess stenosis. Left-sided disc osteophyte complex with extraforaminal encroachment on the left L3 nerve root. L3-L4: No significant disc herniation. Stable mild-to-moderate multifactorial spinal and bilateral lateral recess stenosis . L4-L5: Unchanged moderate multifactorial spinal and lateral recess stenosis. No significant foraminal stenosis. L5-S1: Stable bulging annulus and osteophytic ridging with stable mild bilateral lateral recess stenosis and foraminal stenosis. No significant spinal stenosis. IMPRESSION: 1. Stable acute or subacute compression fractures at L4 with approximately 40% height loss and minimal retropulsion 2. Superior endplate fracture of L5. 3. Stable degenerative lumbar spondylosis with multilevel disc and facet disease, including moderate spinal and lateral recess stenosis at L2-3 and L4-5, and mild bilateral lateral recess and foraminal stenosis at L5-S1. Electronically signed by: Maude Stammer MD 02/10/2024 06:50 PM EDT RP Workstation: [id number]    Labs:  CBC: Recent Labs    08/19/23 1223 01/15/24 1651 01/16/24 1015 01/17/24 0948  WBC 5.3 6.6 7.7 7.6  HGB 12.0 10.6* 9.9* 10.1*  HCT 36.1 32.6* 30.4* 30.2*  PLT 262 211 245 277    COAGS: Recent Labs    06/24/23 1942 06/29/23 0125 06/29/23 0125 06/29/23 0841 06/29/23 1700 06/30/23 0148 06/30/23 1226  INR 1.2 1.8*  --   --   --   --   --   APTT  --  40*   < > 69* 120* 112* 73*   < > = values in this interval not displayed.    BMP: Recent Labs    08/19/23 1223 01/05/24 1419 01/15/24 1651 01/16/24 1015 01/17/24 0948  NA 135 139 136 131* 136  K 4.4 5.1 4.1 5.0 4.5  CL 104 104 105  97* 99  CO2 22 21 24 22 24   GLUCOSE 168* 101* 128* 182* 145*  BUN 27* 22 14 21  29*  CALCIUM  9.1 9.2 8.8* 8.6* 9.3  CREATININE 1.38* 1.80* 1.23* 1.28* 1.33*  GFRNONAA 37*  --  42* 40* 38*    LIVER FUNCTION TESTS: Recent Labs    06/28/23 1811 08/19/23 1223 01/16/24 1015 01/17/24 0948  BILITOT 1.1 0.6 1.5* 0.7  AST 107* 66* 49* 33  ALT 64* 35 24 24  ALKPHOS 66 84 58 53  PROT 7.2 7.5 6.5 6.6  ALBUMIN 3.6 3.7 2.7* 2.8*    TUMOR MARKERS: No results for input(s): AFPTM, CEA, CA199, CHROMGRNA in the last 8760 hours.  Assessment & Plan:   Patient has suffered subacute osteoporotic fracture of the L4 and L5 vertebra.   History and exam have demonstrated the following:  Acute/Subacute fracture by imaging dated 02/10/24, Pain on exam concordant with level of fracture, Failure of conservative therapy and pain refractory to narcotic pain mediation, and Significant disability on the [employer] Disability Questionnaire with 20/24 positive symptoms, reflecting significant impact/impairment of (ADLs)   ICD-10-CM Codes that Support Medical Necessity (welshblog.at.[email or web address]?[email or web address])  M80.08XA    Age-related osteoporosis with current pathological fracture, vertebra(e), initial encounter for fracture   Plan:  L4 and L5 vertebral body augmentation with balloon kyphoplasty  Post-procedure disposition: outpatient DRI-A (next Tuesday if possible) Medication holds: Xarelto   The patient has suffered a fracture of the L4 and L5 vertebral bodies. It is recommended that patients aged 23 years  or older be evaluated for possible testing or treatment of osteoporosis. A copy of this consult report is sent to the patient's referring physician.     Total time spent on today's visit was over  40 Minutes including both face-to-face time and non face-to-face time, personally spent on review of chart (including labs and relevant imaging), discussing  further workup and treatment options, referral to specialist if needed, reviewing outside records if pertinent, answering patient questions, and coordinating care regarding osteoporotic compression fractures of L4 and L5 as well as management strategy.     Electronically Signed: Wilkie POUR Seng Larch 02/18/2024, 2:01 PM

## 2024-02-19 NOTE — Progress Notes (Signed)
 [facility] - Physiatry  PROCEDURE: 1. A Right S1 transforaminal epidural injection under fluoroscopic guidance. 2. Use of fluoroscopic guidance was required to ensure adequate delivery of medication into the epidural space and for proper needle placement. 3. The patient was monitored with continuous pulse oximetry throughout the procedure.   DIAGNOSIS/INDICATION FOR PROCEDURE: The patient is a pleasant 89 year old female followed by [facility] for acute on chronic low back pain with radiation into the right lateral thigh to the calf.  Clinically her symptoms are most consistent with a lumbosacral radiculitis.  MRI from [facility] dated 02/10/2024 both images and report reviewed today. COMPARISON: Prior MRI 10/32/2005 and CT scan 01/13/2024.  FINDINGS:  BONES AND ALIGNMENT: Stable degenerative lumbar spondylosis with multilevel disc disease and facet disease. Stable degenerative retrolisthesis of L4. Acute or subacute compression fractures involving L4 and the superior endplate of L5. Estimated 40% compression deformity at L4 with minimal retropulsion. There is also a remote compression fracture of L3. Bone marrow signal is unremarkable.  SPINAL CORD: The conus medullaris terminates at T12-L1.  SOFT TISSUES: No significant paraspinal or retroperitoneal findings.   T12-L1: Mild facet disease but no disc protrusions, spinal or foraminal stenosis. Minimal left lateral recess encroachment.   L1-L2: Stable mild diffuse bulging annulus and facet disease with flattening of the  ventral thecal sac. Mild left foraminal encroachment but no significant spinal or foraminal stenosis.   L2-L3: Stable advanced degenerative disc disease and facet disease with a partially calcified disc protrusion. Stable mass effect on the ventral thecal sac with moderate spinal and bilateral lateral recess stenosis. Left-sided disc osteophyte complex with extraforaminal encroachment on the  left L3 nerve root.   L3-L4: No significant disc herniation. Stable mild-to-moderate multifactorial spinal and bilateral lateral recess stenosis .   L4-L5: Unchanged moderate multifactorial spinal and lateral recess stenosis. No significant foraminal stenosis.   L5-S1: Stable bulging annulus and osteophytic ridging with stable mild bilateral lateral recess stenosis and foraminal stenosis. No significant spinal stenosis.   IMPRESSION:  1. Stable acute or subacute compression fractures at L4 with approximately 40%  height loss and minimal retropulsion  2. Superior endplate fracture of L5.  3. Stable degenerative lumbar spondylosis with multilevel disc and facet  disease, including moderate spinal and lateral recess stenosis at L2-3 and  L4-5, and mild bilateral lateral recess and foraminal stenosis at L5-S1.   Electronically signed by: Maude Stammer MD 02/10/2024 06:50 PM EDT RP   She has diabetes mellitus.  Hemoglobin A1c on 01/15/2024 was 6.7.  Her pain is rated 8/10.  Procedures: 02/19/2024: Right S1 transforaminal ESI (dexamethasone  10 mg) 12/15/2023: RFA to the left L4-5 and L5-S1 facet joints (50% relief) 11/04/2023: RFA to the right L4-5 and L5-S1 facet joints (50% relief) 03/04/2023: RFA to the left L4-5 and L5-S1 facet joints (85% relief) 02/25/2023: RFA to the right L4-5 and L5-S1 facet joints (80% relief) 02/11/2023: MBB to the bilateral L4-5 and L5-S1 facet joints (8/10 to 0/10) 01/27/2023: MBB to the bilateral L4-5 and L5-S1 facet joints (8/10 to 0/10) 06/23/2022: Right L5-S1 and right S1 transforaminal ESI (40% relief, dexamethasone  10 mg) 05/20/2022: Bilateral L3-4 transforaminal ESI (no relief, dexamethasone  8 mg) 02/06/2022: Bilateral L3-4 transforaminal ESI (70% relief , dexamethasone  8 mg) 08/22/2021: Left knee injection (good relief) 07/31/2021: Bilateral L3-4 transforaminal ESI (70% relief, dexamethasone  8 mg)   IMPRESSION/RESULTS: Patient tolerated the  procedure well without any complications.  We used a 3.5 inch needle and 2% lidocaine .  She has  poor tolerance for the prone position.  Consideration was made for right L5-S1 transforaminal ESI.  Upon review of the chart in March 2024 a right L5-S1 transforaminal ESI yielded contrast breading mostly peripherally and therefore we elected to do a right S1 transforaminal ESI today.  I would recommend low volume.  Immediately after the procedure she reported 70% relief.  She will follow-up with Whitney in 3 to 4 weeks.       INFORMED CONSENT: The patient understood the potential risks and benefits of the procedure, which were explained to the patient prior to the procedure.  The patient read and signed the consent stating complete understanding of the information, and wished to proceed with the procedure, there were no barriers to understanding.  Ample time was given for any questions to be answered prior to the procedure. The risks of the procedure were explained including, but not limited to the risk of bleeding and/or infection into the spinal area, intervertebral discs, or epidural space; nerve injury; nerve irritation; reaction to medications; etc.  The patient denied any history of bleeding disorders, allergies to medications used or other medical contraindications to the procedure.  No promises were given to any expected outcome.   PROCEDURE: A time out was performed by physician and assistant.  Correct patient, procedure, plan of care, site, position, and equipment were verified.  The patient was sterilely prepped with a triple scrub of betadine solution and draped in the prone position.  Careful attention was paid to aseptic technique throughout the procedure.  Then, the Right S1 foramen was identified under fluoroscopic guidance.  The overlying skin and subcutaneous tissues were anesthetized with approximately 2 cc of 1% Xylocaine .  A 25 gauge spinal needle was inserted down into the foramen.   Approximately 2 cc of Omnipaque -240 mgI/mL contrast was infiltrated under real time-fluoroscopy demonstrating satisfactory spread along the exiting spinal nerve and into the epidural space without vascular uptake.  No aspirate was noted.  Spot films were taken.  A solution containing 1.0 cc of dexamethasone  sodium phosphate 10 mg/cc and 0.5 cc of 2% preservative-free lidocaine  was slowly infiltrated around the spinal nerve and into the epidural space under real-time fluoroscopy.  There was good contrast washout.  The needle was removed.  There were no complications.   DISCHARGE SUMMARY: The patient was monitored post-injection for 30 minutes in the recovery area and remained stable without evidence of complications.  After the procedure, the patient ambulated in and from the office without difficulty.  The patient was discharged with discharge instructions in stable condition.  Patient was instructed to contact us  with any problems.  Procedure fluoro time: 20 seconds. Fluoroscopy dose: 12.85 mGy.  This is below the dose threshold #1 (5000 mGy).    BENJAMIN CHARLES CHASNIS, DO

## 2024-02-19 NOTE — Telephone Encounter (Signed)
 Tylene,  You saw this patient on 01/07/2024. Per protocol we request that you comment on his cardiac risk to proceed with Kyphoplasty on TBD since it has been less than 2 months since evaluated in the office. Please send your comment to P CV Pre-Op Pool.  Thank you, Lamarr Satterfield DNP, ANP, AACC.

## 2024-02-19 NOTE — Telephone Encounter (Signed)
 Pharmacy please advise on holding Xarelto  prior to Kyphoplasty scheduled for TBD. Last labs 01/17/2024. Thank you.

## 2024-02-19 NOTE — Telephone Encounter (Signed)
 They have requested pharmacy clearance only.  Xarelto  recommendations per pharmacy.

## 2024-02-25 ENCOUNTER — Other Ambulatory Visit: Payer: Self-pay | Admitting: Interventional Radiology

## 2024-02-25 DIAGNOSIS — M8008XA Age-related osteoporosis with current pathological fracture, vertebra(e), initial encounter for fracture: Secondary | ICD-10-CM

## 2024-02-25 NOTE — Telephone Encounter (Signed)
 2nd urgent request sent per fax

## 2024-02-25 NOTE — Telephone Encounter (Signed)
 Patient with diagnosis of atrial fibrillation  on Xarelto  for anticoagulation.    Procedure:  Kyphoplasty   Date of Surgery:  Clearance TBD     CHA2DS2-VASc Score = 7   This indicates a 11.2% annual risk of stroke. The patient's score is based upon: CHF History: 1 HTN History: 1 Diabetes History: 1 Stroke History: 0 Vascular Disease History: 1 Age Score: 2 Gender Score: 1   CrCl 38 Platelet count 277  Patient has not had an Afib/aflutter ablation in the last 3 months, DCCV within the last 4 weeks or a watchman implanted in the last 45 days   Per office protocol, patient can hold Xarelto  for 3 days prior to procedure.   (3 day hold for all spinal procedures)  Patient will not need bridging with Lovenox (enoxaparin) around procedure.  **This guidance is not considered finalized until pre-operative APP has relayed final recommendations.**

## 2024-02-25 NOTE — Telephone Encounter (Signed)
     Primary Cardiologist: Timothy Gollan, MD  Clinical pharmacist have reviewed patient's chart and made the following recommendations for , Sarah Potts   Patient with diagnosis of atrial fibrillation  on Xarelto  for anticoagulation.     Procedure:  Kyphoplasty   Date of Surgery:  Clearance TBD       CHA2DS2-VASc Score = 7   This indicates a 11.2% annual risk of stroke. The patient's score is based upon: CHF History: 1 HTN History: 1 Diabetes History: 1 Stroke History: 0 Vascular Disease History: 1 Age Score: 2 Gender Score: 1   CrCl 38 Platelet count 277   Patient has not had an Afib/aflutter ablation in the last 3 months, DCCV within the last 4 weeks or a watchman implanted in the last 45 days    Per office protocol, patient can hold Xarelto  for 3 days prior to procedure.   (3 day hold for all spinal procedures)   Patient will not need bridging with Lovenox (enoxaparin) around procedure.  I will route this recommendation to the requesting party via Epic fax function and remove from pre-op pool.  Please call with questions.  Josefa CHRISTELLA. [name] NP-C     02/25/2024, 3:54 PM [facility] [address] [place], [place] [address] Office [phone]

## 2024-03-04 NOTE — Discharge Instructions (Signed)

## 2024-03-07 ENCOUNTER — Telehealth: Payer: Self-pay

## 2024-03-08 ENCOUNTER — Ambulatory Visit
Admission: RE | Admit: 2024-03-08 | Discharge: 2024-03-08 | Disposition: A | Discharge status: Home / Self Care | Source: Ambulatory Visit | Attending: Interventional Radiology | Admitting: Interventional Radiology

## 2024-03-08 ENCOUNTER — Other Ambulatory Visit: Payer: Self-pay | Admitting: Interventional Radiology

## 2024-03-08 ENCOUNTER — Ambulatory Visit
Admission: RE | Admit: 2024-03-08 | Discharge: 2024-03-08 | Disposition: A | Source: Ambulatory Visit | Attending: Interventional Radiology | Admitting: Interventional Radiology

## 2024-03-08 DIAGNOSIS — M8008XA Age-related osteoporosis with current pathological fracture, vertebra(e), initial encounter for fracture: Secondary | ICD-10-CM

## 2024-03-08 HISTORY — PX: IR KYPHO LUMBAR INC FX REDUCE BONE BX UNI/BIL CANNULATION INC/IMAGING: IMG5519

## 2024-03-08 HISTORY — PX: IR KYPHO EA ADDL LEVEL THORACIC OR LUMBAR: IMG5520

## 2024-03-08 MED ORDER — ACETAMINOPHEN 10 MG/ML IV SOLN: 1000.0000 mg | Freq: Once | INTRAVENOUS | Status: DC

## 2024-03-08 MED ORDER — NALOXONE HCL 0.4 MG/ML IJ SOLN
INTRAMUSCULAR | Status: AC | PRN
Start: 2024-03-08 — End: 2024-03-08
  Administered 2024-03-08: 0.2 mg via INTRAVENOUS

## 2024-03-08 MED ORDER — SODIUM CHLORIDE 0.9 % IV SOLN: INTRAVENOUS | Status: DC

## 2024-03-08 MED ORDER — FLUMAZENIL 0.5 MG/5ML IV SOLN
INTRAVENOUS | Status: AC | PRN
  Administered 2024-03-08: 0.2 mg via INTRAVENOUS

## 2024-03-08 MED ORDER — MIDAZOLAM HCL (PF) 2 MG/2ML IJ SOLN
1.0000 mg | INTRAMUSCULAR | Status: DC | PRN
  Administered 2024-03-08 (×4): 1 mg via INTRAVENOUS

## 2024-03-08 MED ORDER — CEFAZOLIN SODIUM-DEXTROSE 2-4 GM/100ML-% IV SOLN
2.0000 g | INTRAVENOUS | Status: AC
  Administered 2024-03-08: 2 g via INTRAVENOUS

## 2024-03-08 MED ORDER — FENTANYL CITRATE (PF) 50 MCG/ML IJ SOSY
25.0000 ug | PREFILLED_SYRINGE | INTRAMUSCULAR | Status: DC | PRN
  Administered 2024-03-08: 25 ug via INTRAVENOUS
  Administered 2024-03-08: 50 ug via INTRAVENOUS
  Administered 2024-03-08: 25 ug via INTRAVENOUS
  Administered 2024-03-08: 50 ug via INTRAVENOUS
  Administered 2024-03-08 (×2): 25 ug via INTRAVENOUS

## 2024-03-08 MED ORDER — LIDOCAINE HCL (PF) 1 % IJ SOLN
20.0000 mL | Freq: Once | INTRAMUSCULAR | Status: AC
  Administered 2024-03-08: 20 mL via INTRADERMAL

## 2024-03-08 MED ORDER — LIDOCAINE-EPINEPHRINE 1 %-1:100000 IJ SOLN
10.0000 mL | Freq: Once | INTRAMUSCULAR | Status: AC
  Administered 2024-03-08: 10 mL via INTRADERMAL

## 2024-03-08 NOTE — Progress Notes (Signed)
 Patient ID: Sarah Potts, female   DOB: 1932-12-19, 89 y.o.   MRN: [id number]  Pt recovering in nurses station. Dr. Karalee in to see pt. Pt alert and speaking in complete sentences. Pt ambulated to restroom with walker, steady gait.

## 2024-03-09 ENCOUNTER — Observation Stay
Admission: EM | Admit: 2024-03-09 | Discharge: 2024-03-14 | Disposition: A | Discharge status: Home / Self Care | Attending: Emergency Medicine | Admitting: Emergency Medicine

## 2024-03-09 ENCOUNTER — Other Ambulatory Visit: Payer: Self-pay

## 2024-03-09 ENCOUNTER — Emergency Department

## 2024-03-09 DIAGNOSIS — E66811 Obesity, class 1: Secondary | ICD-10-CM | POA: Insufficient documentation

## 2024-03-09 DIAGNOSIS — I480 Paroxysmal atrial fibrillation: Secondary | ICD-10-CM | POA: Diagnosis not present

## 2024-03-09 DIAGNOSIS — E871 Hypo-osmolality and hyponatremia: Secondary | ICD-10-CM | POA: Diagnosis not present

## 2024-03-09 DIAGNOSIS — I2510 Atherosclerotic heart disease of native coronary artery without angina pectoris: Secondary | ICD-10-CM | POA: Diagnosis not present

## 2024-03-09 DIAGNOSIS — M25551 Pain in right hip: Secondary | ICD-10-CM | POA: Diagnosis not present

## 2024-03-09 DIAGNOSIS — Z9889 Other specified postprocedural states: Secondary | ICD-10-CM | POA: Diagnosis not present

## 2024-03-09 DIAGNOSIS — Z6832 Body mass index (BMI) 32.0-32.9, adult: Secondary | ICD-10-CM | POA: Insufficient documentation

## 2024-03-09 DIAGNOSIS — M545 Low back pain, unspecified: Secondary | ICD-10-CM | POA: Insufficient documentation

## 2024-03-09 DIAGNOSIS — G8929 Other chronic pain: Secondary | ICD-10-CM | POA: Diagnosis present

## 2024-03-09 DIAGNOSIS — E1165 Type 2 diabetes mellitus with hyperglycemia: Secondary | ICD-10-CM | POA: Insufficient documentation

## 2024-03-09 DIAGNOSIS — E538 Deficiency of other specified B group vitamins: Secondary | ICD-10-CM | POA: Diagnosis not present

## 2024-03-09 DIAGNOSIS — M549 Dorsalgia, unspecified: Principal | ICD-10-CM

## 2024-03-09 DIAGNOSIS — E1129 Type 2 diabetes mellitus with other diabetic kidney complication: Secondary | ICD-10-CM | POA: Diagnosis present

## 2024-03-09 DIAGNOSIS — I10 Essential (primary) hypertension: Secondary | ICD-10-CM | POA: Diagnosis not present

## 2024-03-09 DIAGNOSIS — M5416 Radiculopathy, lumbar region: Secondary | ICD-10-CM

## 2024-03-09 LAB — BASIC METABOLIC PANEL WITH GFR
Anion gap: 10 (ref 5–15)
BUN: 13 mg/dL (ref 8–23)
CO2: 28 mmol/L (ref 22–32)
Calcium: 9 mg/dL (ref 8.9–10.3)
Chloride: 99 mmol/L (ref 98–111)
Creatinine, Ser: 1.02 mg/dL — ABNORMAL HIGH (ref 0.44–1.00)
GFR, Estimated: 52 mL/min — ABNORMAL LOW (ref >60)
Glucose, Bld: 136 mg/dL — ABNORMAL HIGH (ref 70–99)
Potassium: 4 mmol/L (ref 3.5–5.1)
Sodium: 137 mmol/L (ref 135–145)

## 2024-03-09 LAB — CBC WITH DIFFERENTIAL/PLATELET
Abs Immature Granulocytes: 0.06 K/uL (ref 0.00–0.07)
Basophils Absolute: 0 K/uL (ref 0.0–0.1)
Basophils Relative: 0 %
Eosinophils Absolute: 0.1 K/uL (ref 0.0–0.5)
Eosinophils Relative: 1 %
HCT: 29.5 % — ABNORMAL LOW (ref 36.0–46.0)
Hemoglobin: 9.5 g/dL — ABNORMAL LOW (ref 12.0–15.0)
Immature Granulocytes: 1 %
Lymphocytes Relative: 13 %
Lymphs Abs: 1 K/uL (ref 0.7–4.0)
MCH: 31.1 pg (ref 26.0–34.0)
MCHC: 32.2 g/dL (ref 30.0–36.0)
MCV: 96.7 fL (ref 80.0–100.0)
Monocytes Absolute: 0.6 K/uL (ref 0.1–1.0)
Monocytes Relative: 8 %
Neutro Abs: 5.7 K/uL (ref 1.7–7.7)
Neutrophils Relative %: 77 %
Platelets: 181 K/uL (ref 150–400)
RBC: 3.05 MIL/uL — ABNORMAL LOW (ref 3.87–5.11)
RDW: 15.7 % — ABNORMAL HIGH (ref 11.5–15.5)
WBC: 7.4 K/uL (ref 4.0–10.5)
nRBC: 0 % (ref 0.0–0.2)

## 2024-03-09 LAB — GLUCOSE, CAPILLARY: Glucose-Capillary: 149 mg/dL — ABNORMAL HIGH (ref 70–99)

## 2024-03-09 MED ORDER — INSULIN ASPART 100 UNIT/ML IJ SOLN
0.0000 [IU] | Freq: Every day | INTRAMUSCULAR | Status: DC
  Administered 2024-03-12: 2 [IU] via SUBCUTANEOUS
  Filled 2024-03-09: qty 2

## 2024-03-09 MED ORDER — AMIODARONE HCL 200 MG PO TABS
200.0000 mg | ORAL_TABLET | Freq: Every day | ORAL | Status: DC
  Administered 2024-03-10 – 2024-03-14 (×5): 200 mg via ORAL
  Filled 2024-03-09 (×5): qty 1

## 2024-03-09 MED ORDER — TRAZODONE HCL 50 MG PO TABS
25.0000 mg | ORAL_TABLET | Freq: Every evening | ORAL | Status: DC | PRN
  Filled 2024-03-09: qty 1

## 2024-03-09 MED ORDER — ACETAMINOPHEN 650 MG RE SUPP: 650.0000 mg | Freq: Four times a day (QID) | RECTAL | Status: DC | PRN

## 2024-03-09 MED ORDER — RIVAROXABAN 15 MG PO TABS
15.0000 mg | ORAL_TABLET | Freq: Every day | ORAL | Status: DC
  Administered 2024-03-10 – 2024-03-13 (×4): 15 mg via ORAL
  Filled 2024-03-09 (×5): qty 1

## 2024-03-09 MED ORDER — INSULIN ASPART 100 UNIT/ML IJ SOLN
0.0000 [IU] | Freq: Three times a day (TID) | INTRAMUSCULAR | Status: DC
  Administered 2024-03-10 – 2024-03-11 (×3): 3 [IU] via SUBCUTANEOUS
  Administered 2024-03-11: 2 [IU] via SUBCUTANEOUS
  Administered 2024-03-11 – 2024-03-12 (×2): 3 [IU] via SUBCUTANEOUS
  Administered 2024-03-12: 8 [IU] via SUBCUTANEOUS
  Administered 2024-03-12: 2 [IU] via SUBCUTANEOUS
  Administered 2024-03-13: 3 [IU] via SUBCUTANEOUS
  Administered 2024-03-13: 5 [IU] via SUBCUTANEOUS
  Administered 2024-03-13: 8 [IU] via SUBCUTANEOUS
  Administered 2024-03-14: 3 [IU] via SUBCUTANEOUS
  Filled 2024-03-09: qty 3
  Filled 2024-03-09: qty 8
  Filled 2024-03-09: qty 3
  Filled 2024-03-09: qty 2
  Filled 2024-03-09: qty 3
  Filled 2024-03-09: qty 5
  Filled 2024-03-09: qty 3
  Filled 2024-03-09: qty 8
  Filled 2024-03-09 (×2): qty 3
  Filled 2024-03-09: qty 2
  Filled 2024-03-09: qty 3

## 2024-03-09 MED ORDER — PREGABALIN 50 MG PO CAPS
100.0000 mg | ORAL_CAPSULE | Freq: Two times a day (BID) | ORAL | Status: DC
  Administered 2024-03-09 – 2024-03-14 (×10): 100 mg via ORAL
  Filled 2024-03-09 (×10): qty 2

## 2024-03-09 MED ORDER — ONDANSETRON HCL 4 MG/2ML IJ SOLN: 4.0000 mg | Freq: Four times a day (QID) | INTRAMUSCULAR | Status: DC | PRN

## 2024-03-09 MED ORDER — METOPROLOL SUCCINATE ER 25 MG PO TB24
25.0000 mg | ORAL_TABLET | Freq: Every day | ORAL | Status: DC
  Administered 2024-03-10 – 2024-03-14 (×5): 25 mg via ORAL
  Filled 2024-03-09 (×5): qty 1

## 2024-03-09 MED ORDER — HYDROMORPHONE HCL 1 MG/ML IJ SOLN
1.0000 mg | Freq: Once | INTRAMUSCULAR | Status: AC
  Administered 2024-03-09: 1 mg via INTRAVENOUS
  Filled 2024-03-09: qty 1

## 2024-03-09 MED ORDER — AMLODIPINE BESYLATE 5 MG PO TABS
2.5000 mg | ORAL_TABLET | Freq: Every day | ORAL | Status: DC
  Administered 2024-03-10 – 2024-03-14 (×5): 2.5 mg via ORAL
  Filled 2024-03-09 (×5): qty 1

## 2024-03-09 MED ORDER — HYDROMORPHONE HCL 1 MG/ML IJ SOLN
0.5000 mg | INTRAMUSCULAR | Status: AC
  Administered 2024-03-09: 0.5 mg via INTRAVENOUS
  Filled 2024-03-09: qty 0.5

## 2024-03-09 MED ORDER — ACETAMINOPHEN 325 MG PO TABS
650.0000 mg | ORAL_TABLET | Freq: Four times a day (QID) | ORAL | Status: DC | PRN
  Administered 2024-03-09 – 2024-03-13 (×3): 650 mg via ORAL
  Filled 2024-03-09 (×3): qty 2

## 2024-03-09 MED ORDER — PANTOPRAZOLE SODIUM 40 MG PO TBEC
40.0000 mg | DELAYED_RELEASE_TABLET | Freq: Every day | ORAL | Status: DC
  Administered 2024-03-10 – 2024-03-14 (×5): 40 mg via ORAL
  Filled 2024-03-09 (×5): qty 1

## 2024-03-09 MED ORDER — MORPHINE SULFATE (PF) 2 MG/ML IV SOLN
2.0000 mg | INTRAVENOUS | Status: DC | PRN
  Administered 2024-03-09 – 2024-03-10 (×3): 2 mg via INTRAVENOUS
  Filled 2024-03-09 (×3): qty 1

## 2024-03-09 MED ORDER — HYDROMORPHONE HCL 1 MG/ML IJ SOLN
0.5000 mg | INTRAMUSCULAR | Status: AC
  Administered 2024-03-09: 0.5 mg via INTRAVENOUS

## 2024-03-09 MED ORDER — ATORVASTATIN CALCIUM 20 MG PO TABS
40.0000 mg | ORAL_TABLET | Freq: Every day | ORAL | Status: DC
  Administered 2024-03-10 – 2024-03-14 (×5): 40 mg via ORAL
  Filled 2024-03-09 (×5): qty 2

## 2024-03-09 MED ORDER — ONDANSETRON HCL 4 MG PO TABS: 4.0000 mg | ORAL_TABLET | Freq: Four times a day (QID) | ORAL | Status: DC | PRN

## 2024-03-09 MED ORDER — OXYCODONE HCL 5 MG PO TABS
5.0000 mg | ORAL_TABLET | ORAL | Status: DC | PRN
  Administered 2024-03-09 – 2024-03-11 (×3): 5 mg via ORAL
  Filled 2024-03-09 (×3): qty 1

## 2024-03-09 MED ORDER — ALBUTEROL SULFATE (2.5 MG/3ML) 0.083% IN NEBU: 2.5000 mg | INHALATION_SOLUTION | RESPIRATORY_TRACT | Status: DC | PRN

## 2024-03-09 MED ORDER — HYDROMORPHONE HCL 1 MG/ML IJ SOLN
0.5000 mg | INTRAMUSCULAR | Status: DC
  Filled 2024-03-09: qty 0.5

## 2024-03-09 NOTE — H&P (Addendum)
 History and Physical  Sarah Potts FMW:[id number] DOB: 05/20/1932 DOA: 03/09/2024  PCP: Valora Lynwood FALCON, MD   Chief Complaint: Right hip pain  HPI: Sarah Potts is a 89 y.o. female with medical history significant for CHF, DM 2, hyperlipidemia, hypertension, atrial fibrillation on Xarelto  being admitted for observation due to low back and hip pain.  Patient states that she had a fall in early October, after which she was found to have compression fractures and underwent balloon kyphoplasty x 2 on 11/25.  She presents to the emergency department because she continues to have significant right-sided low back pain which radiates down into her hip and buttocks.  She denies any fevers, chest pain, cough, nausea, vomiting.  States that this pain that she has today in the emergency department is exactly the same pain that she has had since the beginning of October.  Over time, with rehab and therapy the pain had improved somewhat, but yesterday after the procedure her pain is worse.  She lives on her own and despite taking oxycodone  at home the pain is not tolerable when she is trying to ambulate.  When at rest, she is comfortable.  She denies any bowel or bladder incontinence or any other new symptoms.  Review of Systems: Please see HPI for pertinent positives and negatives. A complete 10 system review of systems are otherwise negative.  Past Medical History:  Diagnosis Date   Anal cancer (HCC)    Anemia    Asthma    CHF (congestive heart failure) (HCC)    Diabetes mellitus    Hyperlipidemia    Hypertension    Past Surgical History:  Procedure Laterality Date   BLADDER SURGERY     CARDIAC CATHETERIZATION     COLOSTOMY     CORONARY ANGIOPLASTY WITH STENT PLACEMENT  03/25/2018   Stent to the LCX  2.50 mm x 16mm  [facility] REF [id number]   heart stent     IR KYPHO EA ADDL LEVEL THORACIC OR LUMBAR  03/08/2024   IR KYPHO LUMBAR INC FX REDUCE BONE BX UNI/BIL  CANNULATION INC/IMAGING  03/08/2024   IR RADIOLOGIST EVAL & MGMT  02/18/2024   LEFT HEART CATH AND CORONARY ANGIOGRAPHY N/A 12/23/2021   Procedure: LEFT HEART CATH AND CORONARY ANGIOGRAPHY;  Surgeon: Darron Deatrice LABOR, MD;  Location: [facility] INVASIVE CV LAB;  Service: Cardiovascular;  Laterality: N/A;   TONSILLECTOMY     uterus surgery     removed   Social History:  reports that she has never smoked. She has never used smokeless tobacco. She reports that she does not drink alcohol and does not use drugs.  Allergies  Allergen Reactions   Levofloxacin Nausea Only    Patient reports that she thinks that she could take this medication.  Patient reports that she thinks that she could take this medication.     Patient reports that she thinks that she could take this medication.   Niacin Other (See Comments)    Hot, Flushed  Pt states she get real red and hot.    Hot, Flushed   Niacin And Related Other (See Comments)    Pt states she get real red and hot.    Family History  Problem Relation Age of Onset   Cancer Mother    Breast cancer Mother    Heart disease Father    Heart attack Father    Cancer Sister    Heart disease Sister    Cervical cancer  Sister    Heart disease Brother      Prior to Admission medications   Medication Sig Start Date End Date Taking? Authorizing Provider  acetaminophen  (ACETAMINOPHEN  8 HOUR) 650 MG CR tablet Take 1 tablet (650 mg total) by mouth every 8 (eight) hours as needed for pain. 01/13/24   Janit Kast, PA-C  albuterol  (VENTOLIN  HFA) 108 (90 Base) MCG/ACT inhaler Inhale 1 puff into the lungs every 4 (four) hours as needed. 11/29/22   [provider]  amiodarone  (PACERONE ) 200 MG tablet TAKE 1 TABLET BY MOUTH EVERY DAY 09/11/23   Gollan, Timothy J, MD  amLODipine  (NORVASC ) 2.5 MG tablet Take 2.5 mg by mouth daily. 07/25/23   [provider]  atorvastatin  (LIPITOR) 40 MG tablet TAKE 1 TABLET (40 MG TOTAL) BY MOUTH IN THE MORNING. FOR  CHOLESTEROL. 12/07/23   Gollan, Timothy J, MD  bisacodyl  (DULCOLAX) 5 MG EC tablet Take 1 tablet (5 mg total) by mouth daily as needed for moderate constipation. 01/20/24   Lenon Marien CROME, MD  Cholecalciferol  25 MCG (1000 UT) tablet Take 1,000 Units by mouth daily.    [provider]  dorzolamide-timolol (COSOPT) 2-0.5 % ophthalmic solution Place 1 drop into the left eye 3 (three) times daily.    [provider]  fexofenadine (ALLEGRA) 60 MG tablet Take 60 mg by mouth daily. 12/18/22   [provider]  levothyroxine  (SYNTHROID ) 25 MCG tablet Take 25 mcg by mouth daily.    [provider]  losartan  (COZAAR ) 50 MG tablet Take 1 tablet (50 mg total) by mouth daily. 07/10/23   Gollan, Timothy J, MD  meclizine  (ANTIVERT ) 25 MG tablet Take 1 tablet (25 mg total) by mouth every 6 (six) hours as needed. 07/10/23   Gollan, Timothy J, MD  metFORMIN  (GLUCOPHAGE ) 500 MG tablet Take 1 tablet by mouth 2 (two) times daily with a meal. 10/29/15   [provider]  metoprolol  succinate (TOPROL -XL) 25 MG 24 hr tablet Take 1 tablet (25 mg total) by mouth daily. Take with or immediately following a meal. 07/10/23   Gollan, Timothy J, MD  montelukast  (SINGULAIR ) 10 MG tablet Take 10 mg by mouth at bedtime.      [provider]  nitroGLYCERIN  (NITROSTAT ) 0.4 MG SL tablet Place 1 tablet (0.4 mg total) under the tongue every 5 (five) minutes as needed for chest pain. 12/24/21   Patel, Sona, MD  Omega-3 Fatty Acids (FISH OIL) 1000 MG CAPS Take 1,000 mg by mouth in the morning and at bedtime.    [provider]  omeprazole (PRILOSEC) 40 MG capsule Take 40 mg by mouth daily.      [provider]  pioglitazone (ACTOS) 30 MG tablet Take 30 mg by mouth daily.    [provider]  polyethylene glycol (MIRALAX  / GLYCOLAX ) 17 g packet Take 17 g by mouth 2 (two) times daily. 01/20/24   Lenon Marien CROME, MD  pregabalin  (LYRICA ) 100 MG capsule Take 100 mg by  mouth 2 (two) times daily. 12/28/23 12/27/24  [provider]  sertraline  (ZOLOFT ) 50 MG tablet Take 50 mg by mouth daily. 08/12/22   [provider]  TRULICITY 1.5 MG/0.5ML SOPN Inject into the skin once a week. 12/16/20   [provider]  XARELTO  15 MG TABS tablet TAKE 1 TABLET (15 MG TOTAL) BY MOUTH DAILY. 06/01/23   Gollan, Timothy J, MD    Physical Exam: BP (!) 180/68 (BP Location: Right Arm)   Pulse 73  Temp 98.3 F (36.8 C) (Oral)   Resp 15   Ht 5' 2 (1.575 m)   Wt 81.6 kg   SpO2 96%   BMI 32.92 kg/m  General:  Alert, oriented, calm, in no acute distress  Eyes: EOMI, clear conjuctivae, white sclerea Neck: supple, no masses, trachea mildline  Cardiovascular: RRR, no murmurs or rubs, no peripheral edema  Respiratory: clear to auscultation bilaterally, no wheezes, no crackles  Abdomen: soft, nontender, nondistended, normal bowel tones heard  Skin: dry, no rashes  Musculoskeletal: no joint effusions, normal range of motion except for very limited right hip flexion limited by pain Psychiatric: appropriate affect, normal speech  Neurologic: extraocular muscles intact, clear speech, moving all extremities with intact sensorium         Labs on Admission:  Basic Metabolic Panel: Recent Labs  Lab 03/09/24 1702  NA 137  K 4.0  CL 99  CO2 28  GLUCOSE 136*  BUN 13  CREATININE 1.02*  CALCIUM  9.0   Liver Function Tests: No results for input(s): AST, ALT, ALKPHOS, BILITOT, PROT, ALBUMIN in the last 168 hours. No results for input(s): LIPASE, AMYLASE in the last 168 hours. No results for input(s): AMMONIA in the last 168 hours. CBC: Recent Labs  Lab 03/09/24 1702  WBC 7.4  NEUTROABS 5.7  HGB 9.5*  HCT 29.5*  MCV 96.7  PLT 181   Cardiac Enzymes: No results for input(s): CKTOTAL, CKMB, CKMBINDEX, TROPONINI in the last 168 hours. BNP (last 3 results) Recent Labs    06/24/23 1617 01/05/24 1419  BNP 102.7* 307.2*     ProBNP (last 3 results) No results for input(s): PROBNP in the last 8760 hours.  CBG: No results for input(s): GLUCAP in the last 168 hours.  Radiological Exams on Admission: DG HIP UNILAT WITH PELVIS 2-3 VIEWS RIGHT Result Date: 03/09/2024 EXAM: 2 OR MORE VIEW(S) XRAY OF THE PELVIS AND RIGHT HIP 03/09/2024 12:02:00 PM COMPARISON: 01/15/2024 CLINICAL HISTORY: Pain FINDINGS: BONES AND JOINTS: SI joints are symmetric. No acute fracture. Bilateral hips demonstrate normal alignment. Mild degenerative changes of the right hip. Degenerative changes of the pubic symphysis similar to prior enthesopathy of the iliac crests more pronounced on the right. LUMBAR SPINE: Degenerative changes of the lower lumbar spine. Prior L4 and L5 vertebral augmentation. SOFT TISSUES: Vascular calcifications. IMPRESSION: 1. No acute abnormality of the right hip. 2. Mild degenerative changes of the right hip. Electronically signed by: Donnice Mania MD 03/09/2024 12:51 PM EST RP Workstation: [id number]   IR KYPHO EA ADDL LEVEL THORACIC OR LUMBAR Result Date: 03/08/2024 CLINICAL DATA:  89 year old female with highly symptomatic osteoporotic compression fractures of the L4 and L5 vertebral bodies. She has failed conservative management in therefore presents for cement augmentation with balloon kyphoplasty. EXAM: FLUOROSCOPIC GUIDED KYPHOPLASTY OF THE L4 VERTEBRAL BODY FLUOROSCOPIC GUIDED KYPHOPLASTY OF THE L5 VERTEBRAL BODY COMPARISON:  MRI lumbar spine 02/10/2024 MEDICATIONS: As antibiotic prophylaxis, 1 g Ancef  was ordered pre-procedure and administered intravenously within 1 hour of incision. 1000 mg of acetaminophen  was administered intravenously following the procedure by the radiology nurse. ANESTHESIA/SEDATION: Moderate (conscious) sedation was employed during this procedure. A total of Versed  4 mg and Fentanyl  200 mcg 0.2 mg Narcan  and 0.2 mg romazicon  was administered intravenously by the Radiology nurse. Moderate  Sedation Time: 45 minutes. The patient's level of consciousness and vital signs were monitored continuously by radiology nursing throughout the procedure under my direct supervision. FLUOROSCOPY TIME:  Radiation exposure index: 104.1 mGy, air kerma COMPLICATIONS: None immediate.  PROCEDURE: The procedure, risks (including but not limited to bleeding, infection, organ damage), benefits, and alternatives were explained to the patient. Questions regarding the procedure were encouraged and answered. The patient understands and consents to the procedure. The patient was placed prone on the fluoroscopic table. The skin overlying the lumbar region was then prepped and draped in the usual sterile fashion. Maximal barrier sterile technique was utilized including caps, mask, sterile gowns, sterile gloves, sterile drape, hand hygiene and skin antiseptic. Intravenous Fentanyl  and Versed  were administered as conscious sedation during continuous cardiorespiratory monitoring by the radiology RN. L4 The right pedicle at L4 was then infiltrated with 1% lidocaine  followed by the advancement of a Kyphon trocar needle through the left pedicle into the posterior one-third of the vertebral body. Subsequently, the osteo drill was advanced to the anterior third of the vertebral body. Imaging confirms crossing of the midline allowing for a unipedicular access. The osteo drill was retracted. Through the working cannula, a Kyphon inflatable bone tamp 15 x 2.5 was advanced and positioned with the distal marker approximately 5 mm from the anterior aspect of the cortex. Appropriate positioning was confirmed on the AP projection. At this time, the balloon was expanded using contrast via a Kyphon inflation syringe device via micro tubing. Inflations were continued until there was near apposition with the superior end plate. At this time, methylmethacrylate mixture was reconstituted in the Kyphon bone mixing device system. This was then loaded into  the delivery mechanism, attached to Kyphon cement delivery system. The balloon was deflated and removed followed by the instillation of methylmethacrylate mixture with excellent filling in the AP and lateral projections. No extravasation was noted in the disk spaces or posteriorly into the spinal canal. No epidural venous contamination was seen. L5 The left pedicle at L5 was then infiltrated with 1% lidocaine  followed by the advancement of a Kyphon trocar needle through the left pedicle into the posterior one-third of the vertebral body. Subsequently, the osteo drill was advanced to the anterior third of the vertebral body. Imaging demonstrates that the balloon stays in the left aspect of the vertebral body therefore we will require a bipedicular access at L5. The osteo drill was retracted. Through the working cannula, a Kyphon inflatable bone tamp 15 x 2.5 was advanced and positioned with the distal marker approximately 5 mm from the anterior aspect of the cortex. Appropriate positioning was confirmed on the AP projection. At this time, the balloon was expanded using contrast via a Kyphon inflation syringe device via micro tubing. In similar fashion, the right L5 pedicle was infiltrated with 1% lidocaine  followed by the advancement of a second Kyphon trocar needle through the right pedicle into the posterior third of the vertebral body. Subsequently, the osteo drill was coaxially advanced to the anterior right third. The osteo drill was exchanged for a Kyphon inflatable bone tamp 15 x 2.5, advanced to the 5 mm of the anterior aspect of the cortex. The balloon was then expanded using contrast as above. Inflations were continued until there was near apposition with the superior end plate. At this time, methylmethacrylate mixture was reconstituted in the Kyphon bone mixing device system. This was then loaded into the delivery mechanism, attached to Kyphon bone fillers. The balloons were deflated and removed followed  by the instillation of methylmethacrylate mixture with excellent filling in the AP and lateral projections. No extravasation was noted in the disk spaces or posteriorly into the spinal canal. No epidural venous contamination was seen. The working cannulae  and the bone filler were then retrieved and removed. Hemostasis was achieved with manual compression. The patient tolerated the procedure well without immediate postprocedural complication. IMPRESSION: 1. Technically successful unipedicular L4 vertebral body augmentation using balloon kyphoplasty. 2. Technically successful bipedicular L5 vertebral body augmentation using balloon kyphoplasty. 3. Per CMS PQRS reporting requirements (PQRS Measure 24): Given the patient's age of greater than 50 and the fracture site (hip, distal radius, or spine), the patient should be tested for osteoporosis using DXA, and the appropriate treatment considered based on the DXA results. Electronically Signed   By: Wilkie Lent M.D.   On: 03/08/2024 12:06   IR KYPHO LUMBAR INC FX REDUCE BONE BX UNI/BIL CANNULATION INC/IMAGING Result Date: 03/08/2024 CLINICAL DATA:  89 year old female with highly symptomatic osteoporotic compression fractures of the L4 and L5 vertebral bodies. She has failed conservative management in therefore presents for cement augmentation with balloon kyphoplasty. EXAM: FLUOROSCOPIC GUIDED KYPHOPLASTY OF THE L4 VERTEBRAL BODY FLUOROSCOPIC GUIDED KYPHOPLASTY OF THE L5 VERTEBRAL BODY COMPARISON:  MRI lumbar spine 02/10/2024 MEDICATIONS: As antibiotic prophylaxis, 1 g Ancef  was ordered pre-procedure and administered intravenously within 1 hour of incision. 1000 mg of acetaminophen  was administered intravenously following the procedure by the radiology nurse. ANESTHESIA/SEDATION: Moderate (conscious) sedation was employed during this procedure. A total of Versed  4 mg and Fentanyl  200 mcg 0.2 mg Narcan  and 0.2 mg romazicon  was administered intravenously by the  Radiology nurse. Moderate Sedation Time: 45 minutes. The patient's level of consciousness and vital signs were monitored continuously by radiology nursing throughout the procedure under my direct supervision. FLUOROSCOPY TIME:  Radiation exposure index: 104.1 mGy, air kerma COMPLICATIONS: None immediate. PROCEDURE: The procedure, risks (including but not limited to bleeding, infection, organ damage), benefits, and alternatives were explained to the patient. Questions regarding the procedure were encouraged and answered. The patient understands and consents to the procedure. The patient was placed prone on the fluoroscopic table. The skin overlying the lumbar region was then prepped and draped in the usual sterile fashion. Maximal barrier sterile technique was utilized including caps, mask, sterile gowns, sterile gloves, sterile drape, hand hygiene and skin antiseptic. Intravenous Fentanyl  and Versed  were administered as conscious sedation during continuous cardiorespiratory monitoring by the radiology RN. L4 The right pedicle at L4 was then infiltrated with 1% lidocaine  followed by the advancement of a Kyphon trocar needle through the left pedicle into the posterior one-third of the vertebral body. Subsequently, the osteo drill was advanced to the anterior third of the vertebral body. Imaging confirms crossing of the midline allowing for a unipedicular access. The osteo drill was retracted. Through the working cannula, a Kyphon inflatable bone tamp 15 x 2.5 was advanced and positioned with the distal marker approximately 5 mm from the anterior aspect of the cortex. Appropriate positioning was confirmed on the AP projection. At this time, the balloon was expanded using contrast via a Kyphon inflation syringe device via micro tubing. Inflations were continued until there was near apposition with the superior end plate. At this time, methylmethacrylate mixture was reconstituted in the Kyphon bone mixing device system.  This was then loaded into the delivery mechanism, attached to Kyphon cement delivery system. The balloon was deflated and removed followed by the instillation of methylmethacrylate mixture with excellent filling in the AP and lateral projections. No extravasation was noted in the disk spaces or posteriorly into the spinal canal. No epidural venous contamination was seen. L5 The left pedicle at L5 was then infiltrated with 1% lidocaine  followed by the  advancement of a Kyphon trocar needle through the left pedicle into the posterior one-third of the vertebral body. Subsequently, the osteo drill was advanced to the anterior third of the vertebral body. Imaging demonstrates that the balloon stays in the left aspect of the vertebral body therefore we will require a bipedicular access at L5. The osteo drill was retracted. Through the working cannula, a Kyphon inflatable bone tamp 15 x 2.5 was advanced and positioned with the distal marker approximately 5 mm from the anterior aspect of the cortex. Appropriate positioning was confirmed on the AP projection. At this time, the balloon was expanded using contrast via a Kyphon inflation syringe device via micro tubing. In similar fashion, the right L5 pedicle was infiltrated with 1% lidocaine  followed by the advancement of a second Kyphon trocar needle through the right pedicle into the posterior third of the vertebral body. Subsequently, the osteo drill was coaxially advanced to the anterior right third. The osteo drill was exchanged for a Kyphon inflatable bone tamp 15 x 2.5, advanced to the 5 mm of the anterior aspect of the cortex. The balloon was then expanded using contrast as above. Inflations were continued until there was near apposition with the superior end plate. At this time, methylmethacrylate mixture was reconstituted in the Kyphon bone mixing device system. This was then loaded into the delivery mechanism, attached to Kyphon bone fillers. The balloons were  deflated and removed followed by the instillation of methylmethacrylate mixture with excellent filling in the AP and lateral projections. No extravasation was noted in the disk spaces or posteriorly into the spinal canal. No epidural venous contamination was seen. The working cannulae and the bone filler were then retrieved and removed. Hemostasis was achieved with manual compression. The patient tolerated the procedure well without immediate postprocedural complication. IMPRESSION: 1. Technically successful unipedicular L4 vertebral body augmentation using balloon kyphoplasty. 2. Technically successful bipedicular L5 vertebral body augmentation using balloon kyphoplasty. 3. Per CMS PQRS reporting requirements (PQRS Measure 24): Given the patient's age of greater than 50 and the fracture site (hip, distal radius, or spine), the patient should be tested for osteoporosis using DXA, and the appropriate treatment considered based on the DXA results. Electronically Signed   By: Wilkie Lent M.D.   On: 03/08/2024 12:06   Assessment/Plan WILDER AMODEI is a 89 y.o. female with medical history significant for CHF, DM 2, hyperlipidemia, hypertension, atrial fibrillation on Xarelto  being admitted for observation due to low back and hip pain.   Right hip/low back pain-likely continued discomfort and mild radiculopathy from recent injury and associated compression fracture, now exacerbated after intervention.  No alarm symptoms such as incontinence, weakness, etc. -Observation admission -Pain control -PT/OT evaluation -TOC consult, as patient may benefit from placement  Hypertension-continue home amlodipine , Toprol -XL  Paroxysmal A-fib-continue metoprolol , Xarelto , amiodarone   Type 2 diabetes-not insulin -dependent -Carb modified diet -Moderate dose sliding scale  GERD-omeprazole    Code Status: Full Code-patient was previously DNR/DNI, but after further explanation today patient elects to be full code.   Her son Ozell was present for this conversation as well.  Consults called: None  Admission status: Observation  Time spent: 49 minutes  Wynelle Dreier CHRISTELLA Gail MD [facility] Pager [phone]  If 7PM-7AM, please contact night-coverage [email or web address] Password [facility]  03/09/2024, 6:11 PM

## 2024-03-09 NOTE — ED Triage Notes (Signed)
 First Nurse Note:  Pt via [facility] from home. Pt c/o back pain. Pt had back surgery here yesterday. Reports she is having breakthrough pain. EMS report 50 mcg of Fentanyl  IM, no recent falls or injury. VSS. Pt is A&Ox4 and NAD.

## 2024-03-09 NOTE — ED Provider Notes (Signed)
 [facility] Emergency Department Provider Note     Event Date/Time   First MD Initiated Contact with Patient 03/09/24 1010     (approximate)   History   Back Pain   HPI  Sarah Potts is a 89 y.o. female with a history of lumbar compression fracture s/p kyphoplasty (L4/L5), CKD, DM type II, diabetic neuropathy, iron  deficiency anemia, B12 deficiency, who presents to the ED accompanied by her adult son.  Patient presents 1 day status post outpatient kyphoplasty procedure performed by Dr. Lynwood Null.  Patient discharged in no acute distress.  She reports that out of recovery, she experienced right sided radicular pain primarily at her groin region when she ambulated to the bathroom.  Patient discharged to care of her family member home yesterday.  She denies any prescription medicines for pain at that time.  She presents to the ED today for evaluation of acute on chronic pain and disability related to being unable to stand and bear weight to her legs.  She gives a report of a mechanical fall on Friday, 4 days before her procedure, but denies any evaluation for injuries at that time.  By her report, she had been ambulating without difficulty prior to the procedure.  No reports of any bladder or bowel incontinence, foot drop, or saddle anesthesia.  Patient presents to the ED via EMS from home.  She received a dose of fentanyl  IM and route.  She rates her pain at a 6 out of 10 currently.   Physical Exam   Triage Vital Signs: ED Triage Vitals  Encounter Vitals Group     BP 03/09/24 0945 (!) 171/65     Girls Systolic BP Percentile --      Girls Diastolic BP Percentile --      Boys Systolic BP Percentile --      Boys Diastolic BP Percentile --      Pulse Rate 03/09/24 0945 71     Resp 03/09/24 0945 16     Temp 03/09/24 0945 99.1 F (37.3 C)     Temp Source 03/09/24 0945 Oral     SpO2 03/09/24 0945 96 %     Weight 03/09/24 0948 180 lb (81.6 kg)     Height  03/09/24 0948 5' 2 (1.575 m)     Head Circumference --      Peak Flow --      Pain Score 03/09/24 0945 7     Pain Loc --      Pain Education --      Exclude from Growth Chart --     Most recent vital signs: Vitals:   03/09/24 1029 03/09/24 1532  BP:  (!) 180/68  Pulse:  73  Resp:  15  Temp:  98.3 F (36.8 C)  SpO2: 96% 96%    General Awake, no distress. NAD HEENT NCAT. PERRL. EOMI. No rhinorrhea. Mucous membranes are moist.  CV:  Good peripheral perfusion.  No CCE distally RESP:  Normal effort.  ABD:  No distention.  Soft and nontender.  LLQ colostomy noted MSK:  Patient actively able to flex and extend the legs bilaterally at the hip.  Right hip flexion resulted in pain localized to the right groin region.  Normal foot dorsiflexion and plantarflexion exam. NEURO: Cranial nerves II to XII grossly intact.  Normal LE DTRs bilaterally.   ED Results / Procedures / Treatments   Labs (all labs ordered are listed, but only abnormal results are displayed)  Labs Reviewed  BASIC METABOLIC PANEL WITH GFR - Abnormal; Notable for the following components:      Result Value   Glucose, Bld 136 (*)    Creatinine, Ser 1.02 (*)    GFR, Estimated 52 (*)    All other components within normal limits  CBC WITH DIFFERENTIAL/PLATELET - Abnormal; Notable for the following components:   RBC 3.05 (*)    Hemoglobin 9.5 (*)    HCT 29.5 (*)    RDW 15.7 (*)    All other components within normal limits  URINALYSIS, ROUTINE W REFLEX MICROSCOPIC  BASIC METABOLIC PANEL WITH GFR  CBC     EKG   RADIOLOGY  I personally viewed and evaluated these images as part of my medical decision making, as well as reviewing the written report by the radiologist.  ED Provider Interpretation: No acute fracture or dislocation  DG HIP UNILAT WITH PELVIS 2-3 VIEWS RIGHT Result Date: 03/09/2024 EXAM: 2 OR MORE VIEW(S) XRAY OF THE PELVIS AND RIGHT HIP 03/09/2024 12:02:00 PM COMPARISON: 01/15/2024 CLINICAL  HISTORY: Pain FINDINGS: BONES AND JOINTS: SI joints are symmetric. No acute fracture. Bilateral hips demonstrate normal alignment. Mild degenerative changes of the right hip. Degenerative changes of the pubic symphysis similar to prior enthesopathy of the iliac crests more pronounced on the right. LUMBAR SPINE: Degenerative changes of the lower lumbar spine. Prior L4 and L5 vertebral augmentation. SOFT TISSUES: Vascular calcifications. IMPRESSION: 1. No acute abnormality of the right hip. 2. Mild degenerative changes of the right hip. Electronically signed by: Donnice Mania MD 03/09/2024 12:51 PM EST RP Workstation: [id number]     PROCEDURES:  Critical Care performed: No  Procedures   MEDICATIONS ORDERED IN ED: Medications  acetaminophen  (TYLENOL ) tablet 650 mg (has no administration in time range)    Or  acetaminophen  (TYLENOL ) suppository 650 mg (has no administration in time range)  traZODone  (DESYREL ) tablet 25 mg (has no administration in time range)  ondansetron  (ZOFRAN ) tablet 4 mg (has no administration in time range)    Or  ondansetron  (ZOFRAN ) injection 4 mg (has no administration in time range)  albuterol  (PROVENTIL ) (2.5 MG/3ML) 0.083% nebulizer solution 2.5 mg (has no administration in time range)  insulin  aspart (novoLOG ) injection 0-15 Units (has no administration in time range)  insulin  aspart (novoLOG ) injection 0-5 Units (has no administration in time range)  oxyCODONE  (Oxy IR/ROXICODONE ) immediate release tablet 5 mg (has no administration in time range)  amiodarone  (PACERONE ) tablet 200 mg (has no administration in time range)  amLODipine  (NORVASC ) tablet 2.5 mg (has no administration in time range)  atorvastatin  (LIPITOR) tablet 40 mg (has no administration in time range)  metoprolol  succinate (TOPROL -XL) 24 hr tablet 25 mg (has no administration in time range)  Rivaroxaban  (XARELTO ) tablet 15 mg (has no administration in time range)  pantoprazole  (PROTONIX ) EC tablet  40 mg (has no administration in time range)  pregabalin  (LYRICA ) capsule 100 mg (has no administration in time range)  HYDROmorphone  (DILAUDID ) injection 0.5 mg (0.5 mg Intravenous Given 03/09/24 1140)  HYDROmorphone  (DILAUDID ) injection 0.5 mg (0.5 mg Intravenous Given 03/09/24 1358)  HYDROmorphone  (DILAUDID ) injection 1 mg (1 mg Intravenous Given 03/09/24 1535)     IMPRESSION / MDM / ASSESSMENT AND PLAN / ED COURSE  I reviewed the triage vital signs and the nursing notes.                              Differential diagnosis includes,  but is not limited to, postop pain, postop infection, radiculopathy, acute on chronic low back pain, decreased mobility, deconditioning  Patient's presentation is most consistent with acute complicated illness / injury requiring diagnostic workup.  Patient's diagnosis is consistent with acute on chronic low back pain and intractable back/hip pain status post lumbar kyphoplasty.  Patient presents endorsing significant pain to disability after her two-level kyphoplasty performed yesterday.  She discharged from the hospital, with some acute pain by her report.  Since that time, patient has not been able to transfer out of her bed onto her buttocks without significant pain on the right hip.  No reports of any bladder or bowel incontinence, foot drop, or saddle anesthesias.  Patient treated in the ED for her acute on chronic pain with multiple doses of IV Dilaudid .  She was unable to achieve enough pain control to transition from supine to sitting bedside.  I discussed the options for admission with the patient along with her adult son who is at bedside.  They are both agreeable to the concern for the patient's decreased mobility and acute pain at this time.  It is unclear whether the symptoms represent a postop complication.  Patient with no signs of any nerve compression, cauda equina, or spinal cord injury.  Patient will be admitted to the hospital service for ongoing  pain control.  She likely be evaluated by the [facility] team with PT/OT, and social work.  Patient may benefit from transfer to skilled nursing facility versus assisted living based on her response to treatment in the interim.  Patient is agreeable to the plan of admission at this time.   ----------------------------------------- 5:42 PM on 03/09/2024 -----------------------------------------  Spoke with Dr. [facility]): He will admit the patient to the hospital service as discussed for acute intractable pain status post two-level lumbar kyphoplasty.   FINAL CLINICAL IMPRESSION(S) / ED DIAGNOSES   Final diagnoses:  Intractable back pain  Status post kyphoplasty  Acute on chronic low back pain     Rx / DC Orders   ED Discharge Orders     None        Note:  This document was prepared using Dragon voice recognition software and may include unintentional dictation errors.    Loyd Candida LULLA Aldona, PA-C 03/09/24 [place]    Claudene Rover, MD 03/10/24 [phone]

## 2024-03-10 DIAGNOSIS — M545 Low back pain, unspecified: Secondary | ICD-10-CM

## 2024-03-10 DIAGNOSIS — M25551 Pain in right hip: Secondary | ICD-10-CM | POA: Diagnosis not present

## 2024-03-10 DIAGNOSIS — E1122 Type 2 diabetes mellitus with diabetic chronic kidney disease: Secondary | ICD-10-CM

## 2024-03-10 DIAGNOSIS — I10 Essential (primary) hypertension: Secondary | ICD-10-CM

## 2024-03-10 DIAGNOSIS — N1831 Chronic kidney disease, stage 3a: Secondary | ICD-10-CM

## 2024-03-10 DIAGNOSIS — G8929 Other chronic pain: Secondary | ICD-10-CM

## 2024-03-10 DIAGNOSIS — E66811 Obesity, class 1: Secondary | ICD-10-CM | POA: Insufficient documentation

## 2024-03-10 LAB — BASIC METABOLIC PANEL WITH GFR
Anion gap: 10 (ref 5–15)
BUN: 15 mg/dL (ref 8–23)
CO2: 25 mmol/L (ref 22–32)
Calcium: 8.9 mg/dL (ref 8.9–10.3)
Chloride: 99 mmol/L (ref 98–111)
Creatinine, Ser: 1.04 mg/dL — ABNORMAL HIGH (ref 0.44–1.00)
GFR, Estimated: 51 mL/min — ABNORMAL LOW (ref >60)
Glucose, Bld: 146 mg/dL — ABNORMAL HIGH (ref 70–99)
Potassium: 4.1 mmol/L (ref 3.5–5.1)
Sodium: 134 mmol/L — ABNORMAL LOW (ref 135–145)

## 2024-03-10 LAB — GLUCOSE, CAPILLARY
Glucose-Capillary: 116 mg/dL — ABNORMAL HIGH (ref 70–99)
Glucose-Capillary: 154 mg/dL — ABNORMAL HIGH (ref 70–99)
Glucose-Capillary: 158 mg/dL — ABNORMAL HIGH (ref 70–99)
Glucose-Capillary: 119 mg/dL — ABNORMAL HIGH (ref 70–99)

## 2024-03-10 LAB — CBC
HCT: 27.2 % — ABNORMAL LOW (ref 36.0–46.0)
Hemoglobin: 8.9 g/dL — ABNORMAL LOW (ref 12.0–15.0)
MCH: 31.3 pg (ref 26.0–34.0)
MCHC: 32.7 g/dL (ref 30.0–36.0)
MCV: 95.8 fL (ref 80.0–100.0)
Platelets: 182 K/uL (ref 150–400)
RBC: 2.84 MIL/uL — ABNORMAL LOW (ref 3.87–5.11)
RDW: 15.6 % — ABNORMAL HIGH (ref 11.5–15.5)
WBC: 6.4 K/uL (ref 4.0–10.5)
nRBC: 0 % (ref 0.0–0.2)

## 2024-03-10 MED ORDER — LOSARTAN POTASSIUM 50 MG PO TABS
50.0000 mg | ORAL_TABLET | Freq: Every day | ORAL | Status: DC
  Administered 2024-03-10 – 2024-03-14 (×5): 50 mg via ORAL
  Filled 2024-03-10 (×5): qty 1

## 2024-03-10 MED ORDER — SERTRALINE HCL 50 MG PO TABS
50.0000 mg | ORAL_TABLET | Freq: Every day | ORAL | Status: DC
  Administered 2024-03-10 – 2024-03-14 (×5): 50 mg via ORAL
  Filled 2024-03-10 (×5): qty 1

## 2024-03-10 MED ORDER — LEVOTHYROXINE SODIUM 25 MCG PO TABS
25.0000 ug | ORAL_TABLET | Freq: Every day | ORAL | Status: DC
  Administered 2024-03-10 – 2024-03-14 (×5): 25 ug via ORAL
  Filled 2024-03-10 (×5): qty 1

## 2024-03-10 MED ORDER — CYCLOBENZAPRINE HCL 10 MG PO TABS
5.0000 mg | ORAL_TABLET | Freq: Three times a day (TID) | ORAL | Status: DC
  Administered 2024-03-10 – 2024-03-14 (×13): 5 mg via ORAL
  Filled 2024-03-10 (×13): qty 1

## 2024-03-10 NOTE — Care Management Obs Status (Signed)
 MEDICARE OBSERVATION STATUS NOTIFICATION   Patient Details  Name: Sarah Potts MRN: [id number] Date of Birth: 1933/03/10   Medicare Observation Status Notification Given:  Yes    [name] 03/10/2024, 3:36 PM

## 2024-03-10 NOTE — Plan of Care (Signed)
  Problem: Skin Integrity: Goal: Risk for impaired skin integrity will decrease Outcome: Progressing   Problem: Pain Managment: Goal: General experience of comfort will improve and/or be controlled Outcome: Progressing   Problem: Safety: Goal: Ability to remain free from injury will improve Outcome: Progressing   Problem: Skin Integrity: Goal: Risk for impaired skin integrity will decrease Outcome: Progressing

## 2024-03-10 NOTE — Hospital Course (Addendum)
 Sarah Potts is a 89 y.o. female with medical history significant for CHF, DM 2, hyperlipidemia, hypertension, atrial fibrillation on Xarelto  being admitted for observation due to low back and hip pain.  Patient states that she had a fall in early October, after which she was found to have compression fractures and underwent balloon kyphoplasty x 2 on 11/25.  She presents to the emergency department because she continues to have significant right-sided low back pain which radiates down into her hip and buttocks  Patient has been treated with the pain medicine and steroids, condition much improved.  She was seen by neurosurgery, no intervention is needed. Initially PT recommended nursing placement, but her condition improved.  Discussed with PT, now recommendation is home with home health.  Patiently medically stable for discharge.

## 2024-03-10 NOTE — Plan of Care (Signed)
  Problem: Safety: Goal: Ability to remain free from injury will improve Outcome: Progressing   Problem: Pain Managment: Goal: General experience of comfort will improve and/or be controlled Outcome: Progressing   Problem: Elimination: Goal: Will not experience complications related to bowel motility Outcome: Progressing   Problem: Nutrition: Goal: Adequate nutrition will be maintained Outcome: Progressing

## 2024-03-10 NOTE — Progress Notes (Signed)
  Progress Note   Patient: Sarah Potts FMW:[id number] DOB: 03-20-1933 DOA: 03/09/2024     0 DOS: the patient was seen and examined on 03/10/2024   Brief hospital course: DORALEE KOCAK is a 89 y.o. female with medical history significant for CHF, DM 2, hyperlipidemia, hypertension, atrial fibrillation on Xarelto  being admitted for observation due to low back and hip pain.  Patient states that she had a fall in early October, after which she was found to have compression fractures and underwent balloon kyphoplasty x 2 on 11/25.  She presents to the emergency department because she continues to have significant right-sided low back pain which radiates down into her hip and buttocks    Principal Problem:   Chronic right hip pain Active Problems:   CAD (coronary artery disease)   HTN (hypertension)   Type II diabetes mellitus with renal manifestations (HCC)   B12 deficiency   Acute on chronic low back pain   Class 1 obesity   Assessment and Plan:  Right hip/low back pain Status post kyphoplasty due to lumbar compression fracture. -likely continued discomfort and mild radiculopathy from recent injury and associated compression fracture, now exacerbated after intervention.  No alarm symptoms such as incontinence, weakness, etc. Patient treated with oral pain medicine, Lyrica .  Added Flexeril . Patient still have significant today, will continue to monitor, planning discharge home with pain medicine.   Essential hypertension Continue beta-blocker, amlodipine , blood pressure still running high, added losartan .   Paroxysmal A-fib-continue metoprolol , Xarelto , amiodarone    Type 2 diabetes-not insulin -dependent -Carb modified diet -Moderate dose sliding scale      Subjective:  Patient still complaining of low back pain, leg pain radiating to the back of the leg.  Physical Exam: Vitals:   03/09/24 2057 03/09/24 2210 03/10/24 0510 03/10/24 0739  BP: (!) 190/82 134/67 (!) 141/62 (!)  147/57  Pulse: 85 78 73 71  Resp: 17  17 17   Temp: 98.4 F (36.9 C)  98.9 F (37.2 C) 98.2 F (36.8 C)  TempSrc:    Oral  SpO2: 98% 98% 97% 97%  Weight: 81.6 kg     Height: 5' 2 (1.575 m)      General exam: Appears calm and comfortable  Respiratory system: Clear to auscultation. Respiratory effort normal. Cardiovascular system: S1 & S2 heard, RRR. No JVD, murmurs, rubs, gallops or clicks. No pedal edema. Gastrointestinal system: Abdomen is nondistended, soft and nontender. No organomegaly or masses felt. Normal bowel sounds heard. Central nervous system: Alert and oriented. No focal neurological deficits. Extremities: Symmetric 5 x 5 power. Skin: No rashes, lesions or ulcers Psychiatry: Judgement and insight appear normal. Mood & affect appropriate.    Data Reviewed:  Lab results reviewed.  Family Communication: None  Disposition: Status is: Observation      Time spent: 35 minutes  Author: Murvin Mana, MD 03/10/2024 12:07 PM  For on call review [email or web address].

## 2024-03-10 NOTE — Progress Notes (Signed)
 PT Cancellation Note  Patient Details Name: Sarah Potts MRN: [id number] DOB: 1932-07-09   Cancelled Treatment:    Reason Eval/Treat Not Completed: Pain limiting ability to participate (Chart reviewed, RN consulted. Pt reports 10/10 pain on arrival, RLE, unable to attempt any mobility for assessment.) Per chart oxy received 12:45, attempt made at 14:25. RN made aware. Niece/nephew at bedside. Will continue to follow and attempt evaluation at later date/time.  2:36 PM, 03/10/24 Peggye JAYSON Linear, PT, DPT Physical Therapist - [facility]  [phone] (ASCOM)     Jovane Foutz C 03/10/2024, 2:35 PM

## 2024-03-10 NOTE — Plan of Care (Signed)

## 2024-03-11 ENCOUNTER — Observation Stay

## 2024-03-11 DIAGNOSIS — M25551 Pain in right hip: Secondary | ICD-10-CM | POA: Diagnosis not present

## 2024-03-11 DIAGNOSIS — I25118 Atherosclerotic heart disease of native coronary artery with other forms of angina pectoris: Secondary | ICD-10-CM

## 2024-03-11 DIAGNOSIS — G8929 Other chronic pain: Secondary | ICD-10-CM | POA: Diagnosis not present

## 2024-03-11 DIAGNOSIS — I10 Essential (primary) hypertension: Secondary | ICD-10-CM | POA: Diagnosis not present

## 2024-03-11 LAB — GLUCOSE, CAPILLARY
Glucose-Capillary: 136 mg/dL — ABNORMAL HIGH (ref 70–99)
Glucose-Capillary: 142 mg/dL — ABNORMAL HIGH (ref 70–99)
Glucose-Capillary: 169 mg/dL — ABNORMAL HIGH (ref 70–99)
Glucose-Capillary: 172 mg/dL — ABNORMAL HIGH (ref 70–99)
Glucose-Capillary: 131 mg/dL — ABNORMAL HIGH (ref 70–99)

## 2024-03-11 MED ORDER — OXYCODONE HCL 5 MG PO TABS
10.0000 mg | ORAL_TABLET | ORAL | Status: DC | PRN
  Administered 2024-03-11 – 2024-03-14 (×7): 10 mg via ORAL
  Filled 2024-03-11 (×7): qty 2

## 2024-03-11 NOTE — Evaluation (Signed)
 Physical Therapy Evaluation Patient Details Name: Sarah Potts MRN: [id number] DOB: Dec 20, 1932 Today's Date: 03/11/2024  History of Present Illness  Sarah Potts is a 89 y.o. female with medical history significant for CHF, DM 2, hyperlipidemia, hypertension, atrial fibrillation on Xarelto  being admitted for observation due to low back and hip pain.  Patient states that she had a fall in early October, after which she was found to have compression fractures and underwent balloon kyphoplasty x 2 on 11/25.  She presents to the emergency department because she continues to have significant right-sided low back pain which radiates down into her hip and buttocks.   Clinical Impression  Patient received in bed. She is pleasant and agrees to PT/OT assessment. Reports little to no pain at rest. Received morphine  prior to session. Requires mod +2 for rolling and side lying to sit and is very painful. Reports 10/10 once seated edge of bed. She is able to stand with min +2 assist and ambulated 6 feet with RW and min +2. She moves well with RW, but is very pain limited. Patient will continue to benefit from skilled PT to improve functional independence.          If plan is discharge home, recommend the following: A lot of help with walking and/or transfers;A lot of help with bathing/dressing/bathroom   Can travel by private vehicle   No    Equipment Recommendations None recommended by PT  Recommendations for Other Services       Functional Status Assessment Patient has had a recent decline in their functional status and demonstrates the ability to make significant improvements in function in a reasonable and predictable amount of time.     Precautions / Restrictions Precautions Precautions: Fall Recall of Precautions/Restrictions: Intact Restrictions Weight Bearing Restrictions Per Provider Order: No      Mobility  Bed Mobility Overal bed mobility: Needs Assistance Bed Mobility:  Rolling, Sidelying to Sit Rolling: Mod assist Sidelying to sit: Mod assist, +2 for physical assistance, Used rails, HOB elevated            Transfers Overall transfer level: Needs assistance Equipment used: Rolling walker (2 wheels) Transfers: Sit to/from Stand Sit to Stand: Min assist                Ambulation/Gait Ambulation/Gait assistance: Editor, Commissioning (Feet): 6 Feet Assistive device: Rolling walker (2 wheels) Gait Pattern/deviations: Step-to pattern, Decreased step length - right, Decreased step length - left, Decreased stride length Gait velocity: decr     General Gait Details: very painful with mobility, however good motivation and required little assistance  Stairs            Wheelchair Mobility     Tilt Bed    Modified Rankin (Stroke Patients Only)       Balance Overall balance assessment: Modified Independent                                           Pertinent Vitals/Pain Pain Assessment Pain Assessment: 0-10 Pain Score: 10-Worst pain ever    Home Living Family/patient expects to be discharged to:: Private residence Living Arrangements: Alone Available Help at Discharge: Family Type of Home: House Home Access: Ramped entrance       Home Layout: One level Home Equipment: Agricultural Consultant (2 wheels)      Prior Function Prior Level of Function :  Needs assist             Mobility Comments: Patient has not been able to get around since Kypho due to pain. Used walker prior       Extremity/Trunk Assessment   Upper Extremity Assessment Upper Extremity Assessment: Defer to OT evaluation    Lower Extremity Assessment Lower Extremity Assessment: Generalized weakness    Cervical / Trunk Assessment Cervical / Trunk Assessment: Back Surgery (recent kypho with a lot of pain since.)  Communication   Communication Communication: Impaired Factors Affecting Communication: Hearing impaired     Cognition Arousal: Alert Behavior During Therapy: WFL for tasks assessed/performed   PT - Cognitive impairments: No apparent impairments                         Following commands: Intact       Cueing Cueing Techniques: Verbal cues     General Comments      Exercises     Assessment/Plan    PT Assessment Patient needs continued PT services  PT Problem List Decreased activity tolerance;Pain;Decreased mobility       PT Treatment Interventions DME instruction;Gait training;Functional mobility training;Therapeutic activities;Therapeutic exercise;Patient/family education    PT Goals (Current goals can be found in the Care Plan section)  Acute Rehab PT Goals Patient Stated Goal: improve pain PT Goal Formulation: With patient Time For Goal Achievement: 03/24/24 Potential to Achieve Goals: Fair    Frequency Min 2X/week     Co-evaluation PT/OT/SLP Co-Evaluation/Treatment: Yes Reason for Co-Treatment: For patient/therapist safety;Necessary to address cognition/behavior during functional activity;To address functional/ADL transfers PT goals addressed during session: Mobility/safety with mobility;Balance;Proper use of DME         AM-PAC PT 6 Clicks Mobility  Outcome Measure Help needed turning from your back to your side while in a flat bed without using bedrails?: A Lot Help needed moving from lying on your back to sitting on the side of a flat bed without using bedrails?: A Lot Help needed moving to and from a bed to a chair (including a wheelchair)?: A Little Help needed standing up from a chair using your arms (e.g., wheelchair or bedside chair)?: A Little Help needed to walk in hospital room?: A Lot Help needed climbing 3-5 steps with a railing? : A Lot 6 Click Score: 14    End of Session   Activity Tolerance: Patient limited by pain Patient left: in chair;with call bell/phone within reach;with chair alarm set Nurse Communication: Mobility status PT  Visit Diagnosis: Pain;Other abnormalities of gait and mobility (R26.89);Difficulty in walking, not elsewhere classified (R26.2) Pain - Right/Left: Right Pain - part of body: Leg;Hip    Time: 9050-8994 PT Time Calculation (min) (ACUTE ONLY): 16 min   Charges:   PT Evaluation $PT Eval Low Complexity: 1 Low   PT General Charges $$ ACUTE PT VISIT: 1 Visit         Shonnie Poudrier, PT, GCS 03/11/24,10:49 AM

## 2024-03-11 NOTE — Plan of Care (Signed)
  Problem: Safety: Goal: Ability to remain free from injury will improve Outcome: Progressing   Problem: Pain Managment: Goal: General experience of comfort will improve and/or be controlled Outcome: Progressing   Problem: Elimination: Goal: Will not experience complications related to bowel motility Outcome: Progressing   Problem: Nutrition: Goal: Adequate nutrition will be maintained Outcome: Progressing

## 2024-03-11 NOTE — [place] FL2 (Signed)
 [place]  MEDICAID FL2 LEVEL OF CARE FORM     IDENTIFICATION  Patient Name: Sarah Potts Birthdate: 06/02/1932 Sex: female Admission Date (Current Location): 03/09/2024  [facility] and [place] Number:  Chiropodist and Address:  [facility], [address], [place], [place] [address]      Provider Number: [id number]  Attending Physician Name and Address:  Laurita Pillion, MD  Relative Name and Phone Number:       Current Level of Care: Hospital Recommended Level of Care: Skilled Nursing Facility Prior Approval Number:    Date Approved/Denied:   PASRR Number: [id number]  Discharge Plan: SNF    Current Diagnoses: Patient Active Problem List   Diagnosis Date Noted   Class 1 obesity 03/10/2024   Chronic right hip pain 03/09/2024   Acute on chronic low back pain 01/18/2024   Sciatica associated with disorder of lumbar spine 01/15/2024   Back pain 01/15/2024   Dizziness 06/29/2023   HFrEF (heart failure with reduced ejection fraction) (HCC) 06/29/2023   Closed right tibial fracture 03/13/2023   Fall 03/06/2023   H/O adenomatous polyp of colon 09/29/2022   Stress-induced cardiomyopathy    Ischemic cardiomyopathy    NSTEMI (non-ST elevated myocardial infarction) (HCC) 12/20/2021   Atrial fibrillation, chronic (HCC) 12/20/2021   Type II diabetes mellitus with renal manifestations (HCC) 12/20/2021   Obesity with body mass index (BMI) of 30.0 to 39.9 12/20/2021   Chronic diastolic CHF (congestive heart failure) (HCC) 12/20/2021   UTI (urinary tract infection) 12/20/2021   Asthma 12/01/2021   Atrial fibrillation (HCC) 12/01/2021   Colostomy in place (HCC) 03/16/2021   Absolute anemia 01/09/2021   Lung nodule 01/09/2021   Anemia in stage 3a chronic kidney disease (HCC) 01/09/2021   B12 deficiency 01/09/2021   Stage 3a chronic kidney disease (HCC) 01/09/2021   Goals of care, counseling/discussion 12/18/2020   History of anal cancer  12/11/2020   Diarrhea 01/02/2014   Iron  deficiency anemia 01/02/2014   Bronchitis 12/15/2012   CAD (coronary artery disease) 11/13/2010   HTN (hypertension) 11/13/2010   Diabetes mellitus (HCC) 11/13/2010   Hyperlipidemia 11/13/2010    Orientation RESPIRATION BLADDER Height & Weight     Self, Time, Situation, Place  Normal Continent Weight: 81.6 kg Height:  5' 2 (157.5 cm)  BEHAVIORAL SYMPTOMS/MOOD NEUROLOGICAL BOWEL NUTRITION STATUS   (None)  (None) Colostomy    AMBULATORY STATUS COMMUNICATION OF NEEDS Skin   Limited Assist Verbally Surgical wounds (vertebral column surgical incision, with skin glue, CDI)                       Personal Care Assistance Level of Assistance  Bathing, Feeding, Dressing Bathing Assistance: Maximum assistance Feeding assistance: Limited assistance Dressing Assistance: Maximum assistance     Functional Limitations Info  Sight, Hearing, Speech Sight Info: Adequate Hearing Info: Impaired Speech Info: Adequate    SPECIAL CARE FACTORS FREQUENCY  PT (By licensed PT), OT (By licensed OT)     PT Frequency: 5x week OT Frequency: 5x week            Contractures Contractures Info: Not present    Additional Factors Info  Code Status, Allergies Code Status Info: Full Code Allergies Info: levofloxacix, niacin, niacin related products           Current Medications (03/11/2024):  This is the current hospital active medication list Current Facility-Administered Medications  Medication Dose Route Frequency Provider Last Rate Last Admin   acetaminophen  (  TYLENOL ) tablet 650 mg  650 mg Oral Q6H PRN Zella, Mir M, MD   650 mg at 03/09/24 2121   Or   acetaminophen  (TYLENOL ) suppository 650 mg  650 mg Rectal Q6H PRN Zella, Mir M, MD       albuterol  (PROVENTIL ) (2.5 MG/3ML) 0.083% nebulizer solution 2.5 mg  2.5 mg Nebulization Q2H PRN Zella, Mir M, MD       amiodarone  (PACERONE ) tablet 200 mg  200 mg Oral Daily Ikramullah, Mir M,  MD   200 mg at 03/11/24 9073   amLODipine  (NORVASC ) tablet 2.5 mg  2.5 mg Oral Daily Zella, Mir M, MD   2.5 mg at 03/11/24 9071   atorvastatin  (LIPITOR) tablet 40 mg  40 mg Oral Daily Ikramullah, Mir M, MD   40 mg at 03/11/24 9073   cyclobenzaprine  (FLEXERIL ) tablet 5 mg  5 mg Oral TID Zhang, Dekui, MD   5 mg at 03/11/24 9071   insulin  aspart (novoLOG ) injection 0-15 Units  0-15 Units Subcutaneous TID WC Zella Mir M, MD   3 Units at 03/11/24 1421   insulin  aspart (novoLOG ) injection 0-5 Units  0-5 Units Subcutaneous QHS Zella, Mir M, MD       levothyroxine  (SYNTHROID ) tablet 25 mcg  25 mcg Oral Daily Zhang, Dekui, MD   25 mcg at 03/11/24 9493   losartan  (COZAAR ) tablet 50 mg  50 mg Oral Daily Zhang, Dekui, MD   50 mg at 03/11/24 9071   metoprolol  succinate (TOPROL -XL) 24 hr tablet 25 mg  25 mg Oral Daily Ikramullah, Mir M, MD   25 mg at 03/11/24 0926   morphine  (PF) 2 MG/ML injection 2 mg  2 mg Intravenous Q4H PRN Mansy, Jan A, MD   2 mg at 03/10/24 1635   ondansetron  (ZOFRAN ) tablet 4 mg  4 mg Oral Q6H PRN Zella Katha HERO, MD       Or   ondansetron  (ZOFRAN ) injection 4 mg  4 mg Intravenous Q6H PRN Zella, Mir M, MD       oxyCODONE  (Oxy IR/ROXICODONE ) immediate release tablet 10 mg  10 mg Oral Q4H PRN Zhang, Dekui, MD   10 mg at 03/11/24 1422   pantoprazole  (PROTONIX ) EC tablet 40 mg  40 mg Oral Daily Ikramullah, Mir M, MD   40 mg at 03/11/24 9071   pregabalin  (LYRICA ) capsule 100 mg  100 mg Oral BID Zella, Mir M, MD   100 mg at 03/11/24 9071   Rivaroxaban  (XARELTO ) tablet 15 mg  15 mg Oral Daily Ikramullah, Mir M, MD   15 mg at 03/11/24 9071   sertraline  (ZOLOFT ) tablet 50 mg  50 mg Oral Daily Zhang, Dekui, MD   50 mg at 03/11/24 9071   traZODone  (DESYREL ) tablet 25 mg  25 mg Oral QHS PRN Zella Katha HERO, MD         Discharge Medications: Please see discharge summary for a list of discharge medications.  Relevant Imaging Results:  Relevant Lab  Results:   Additional Information SSN: [id number]  Shasta DELENA Daring, RN

## 2024-03-11 NOTE — Progress Notes (Signed)
  Progress Note   Patient: Sarah Potts FMW:[id number] DOB: 01/27/1933 DOA: 03/09/2024     0 DOS: the patient was seen and examined on 03/11/2024   Brief hospital course: Sarah Potts is a 89 y.o. female with medical history significant for CHF, DM 2, hyperlipidemia, hypertension, atrial fibrillation on Xarelto  being admitted for observation due to low back and hip pain.  Patient states that she had a fall in early October, after which she was found to have compression fractures and underwent balloon kyphoplasty x 2 on 11/25.  She presents to the emergency department because she continues to have significant right-sided low back pain which radiates down into her hip and buttocks    Principal Problem:   Chronic right hip pain Active Problems:   CAD (coronary artery disease)   HTN (hypertension)   Type II diabetes mellitus with renal manifestations (HCC)   B12 deficiency   Acute on chronic low back pain   Class 1 obesity   Assessment and Plan: Right hip/low back pain Status post kyphoplasty due to lumbar compression fracture. -likely continued discomfort and mild radiculopathy from recent injury and associated compression fracture, now exacerbated after intervention.  No alarm symptoms such as incontinence, weakness, etc. Patient treated with oral pain medicine, Lyrica .  Added Flexeril . I doubled his Percocet dose to 10 mg every 4 hours as needed on 11/28 due to severe pain.  Also consult neurosurgery. Patient also seen by PT/OT, may need short-term rehab.   Essential hypertension Continue beta-blocker, amlodipine , blood pressure still running high, added losartan .   Paroxysmal A-fib-continue metoprolol , Xarelto , amiodarone    Type 2 diabetes-not insulin -dependent -Carb modified diet -Moderate dose sliding scale  Class I obesity with BMI 32.92. Diet and excise.    Subjective:  Patient still complaining of severe lower back pain, thigh pain, radiated to the posterior  leg.  Physical Exam: Vitals:   03/10/24 1530 03/10/24 2137 03/11/24 0527 03/11/24 0825  BP: (!) 155/54 (!) 155/60 (!) 162/61 (!) 180/62  Pulse: 73 71 68 71  Resp: 17 17 17 17   Temp: 98.4 F (36.9 C) 98.9 F (37.2 C) 98.4 F (36.9 C) 98.3 F (36.8 C)  TempSrc: Oral     SpO2: 97% 96% 95% 95%  Weight:      Height:       General exam: Appears calm and comfortable  Respiratory system: Clear to auscultation. Respiratory effort normal. Cardiovascular system: S1 & S2 heard, RRR. No JVD, murmurs, rubs, gallops or clicks. No pedal edema. Gastrointestinal system: Abdomen is nondistended, soft and nontender. No organomegaly or masses felt. Normal bowel sounds heard. Central nervous system: Alert and oriented. No focal neurological deficits. Extremities: Symmetric 5 x 5 power. Skin: No rashes, lesions or ulcers Psychiatry: Judgement and insight appear normal. Mood & affect appropriate.    Data Reviewed:  Lab results reviewed.  Family Communication: Not able to reach son  Disposition: Status is: Observation Patient may need a nursing home placement.     Time spent: 35 minutes  Author: Murvin Mana, MD 03/11/2024 10:17 AM  For on call review [email or web address].

## 2024-03-11 NOTE — Evaluation (Signed)
 Occupational Therapy Evaluation Patient Details Name: Sarah Potts MRN: [id number] DOB: 1933-04-12 Today's Date: 03/11/2024   History of Present Illness   Pt is a 89 y.o. female being admitted for observation due to low back and hip pain. Pt had fall in early October, after which she was found to have compression fractures and underwent balloon kyphoplasty x 2 on 11/25, went home after sx and presents back d/t signifiant leg/hip/low back pain.  PMH of CHF, DM 2, hyperlipidemia, hypertension, atrial fibrillation     Clinical Impressions Pt was seen for OT evaluation this date. PTA, pt resides in a one level home with ramped entry alone. Her son assists with grocery shopping and pt is typically independent otherwise and managing ostomy on her own. Since her back surgery on 11/25 she has been unable to manage any tasks d/t significant pain. Pt presents with deficits in strength, balance and pain management, affecting safe and optimal ADL completion. Pt currently requires Mod A x2 for bed mobility via log roll technique d/t pain. Able to donn slip on slippers with supervision at EOB, stood and pivoted to recliner with Min A x2 for safety with reports of 10/10 pain. Pain resides once seated/resting. Declined further activity at this time d/t pain, was premedicated with morphine  prior to session. Notified MD and case management of DC recommendations.  Pt would benefit from skilled OT services to address noted impairments and functional limitations to maximize safety and independence while minimizing future risk of falls, injury, and readmission. Do anticipate the need for follow up OT services upon acute hospital DC.      If plan is discharge home, recommend the following:   A little help with walking and/or transfers;A lot of help with bathing/dressing/bathroom;Assistance with cooking/housework;Assist for transportation;Help with stairs or ramp for entrance     Functional Status Assessment    Patient has had a recent decline in their functional status and demonstrates the ability to make significant improvements in function in a reasonable and predictable amount of time.     Equipment Recommendations   Other (comment) (defer to next venue)     Recommendations for Other Services         Precautions/Restrictions   Precautions Precautions: Fall Recall of Precautions/Restrictions: Intact Restrictions Weight Bearing Restrictions Per Provider Order: No     Mobility Bed Mobility Overal bed mobility: Needs Assistance Bed Mobility: Rolling, Sidelying to Sit Rolling: Mod assist Sidelying to sit: Mod assist, +2 for physical assistance, Used rails, HOB elevated       General bed mobility comments: log roll technique    Transfers Overall transfer level: Needs assistance Equipment used: Rolling walker (2 wheels) Transfers: Sit to/from Stand, Bed to chair/wheelchair/BSC Sit to Stand: Min assist, +2 safety/equipment     Step pivot transfers: Min assist, +2 safety/equipment     General transfer comment: very pain limited with movement, required +2 assist for safety to move from bed to chair      Balance Overall balance assessment: Needs assistance Sitting-balance support: Feet supported Sitting balance-Leahy Scale: Good     Standing balance support: Reliant on assistive device for balance, Bilateral upper extremity supported Standing balance-Leahy Scale: Fair Standing balance comment: pain limited, balance intact with RW                           ADL either performed or assessed with clinical judgement   ADL Overall ADL's : Needs assistance/impaired  Lower Body Dressing: Contact guard assist;Sitting/lateral leans Lower Body Dressing Details (indicate cue type and reason): to donn slip on slippers Toilet Transfer: Minimal assistance;Stand-pivot;Rolling walker (2 wheels);+2 for safety/equipment Toilet Transfer Details  (indicate cue type and reason): simulated bed to recliner         Functional mobility during ADLs: Rolling walker (2 wheels);Minimal assistance;+2 for safety/equipment General ADL Comments: pain limited     Vision         Perception         Praxis         Pertinent Vitals/Pain Pain Assessment Pain Assessment: 0-10 Pain Score: 10-Worst pain ever Pain Location: leg/back Pain Intervention(s): Monitored during session, Repositioned     Extremity/Trunk Assessment Upper Extremity Assessment Upper Extremity Assessment: Generalized weakness;Overall [facility] for tasks assessed   Lower Extremity Assessment Lower Extremity Assessment: Generalized weakness   Cervical / Trunk Assessment Cervical / Trunk Assessment: Back Surgery   Communication Communication Communication: Impaired Factors Affecting Communication: Hearing impaired   Cognition Arousal: Alert Behavior During Therapy: WFL for tasks assessed/performed                                 Following commands: Intact       Cueing  General Comments   Cueing Techniques: Verbal cues  severely limited by pain and reports RLE numbness-messaged MD about neurosurg consult   Exercises Other Exercises Other Exercises: Edu on role of OT in acute setting, back precautions and log roll technique.   Shoulder Instructions      Home Living Family/patient expects to be discharged to:: Private residence Living Arrangements: Alone Available Help at Discharge: Family Type of Home: House Home Access: Ramped entrance     Home Layout: One level               Home Equipment: Agricultural [employer] (2 wheels)          Prior Functioning/Environment Prior Level of Function : Needs assist             Mobility Comments: Patient has not been able to get around since Kypho due to pain. Used walker prior ADLs Comments: IND in ADL/IADL. Son assists her with grocery shopping. She manages a colostomy bag  independently and must stand while doing this.    OT Problem List: Decreased strength;Decreased activity tolerance;Impaired balance (sitting and/or standing);Pain   OT Treatment/Interventions: Self-care/ADL training;Therapeutic exercise;Therapeutic activities;DME and/or AE instruction;Patient/family education;Balance training      OT Goals(Current goals can be found in the care plan section)   Acute Rehab OT Goals Patient Stated Goal: improve pain OT Goal Formulation: With patient Time For Goal Achievement: 03/25/24 Potential to Achieve Goals: Good ADL Goals Pt Will Perform Grooming: with supervision;standing Pt Will Perform Lower Body Dressing: with supervision;sitting/lateral leans;with adaptive equipment;sit to/from stand Pt Will Transfer to Toilet: with supervision;ambulating   OT Frequency:  Min 2X/week    Co-evaluation PT/OT/SLP Co-Evaluation/Treatment: Yes Reason for Co-Treatment: For patient/therapist safety;Necessary to address cognition/behavior during functional activity;To address functional/ADL transfers PT goals addressed during session: Mobility/safety with mobility;Balance;Proper use of DME        AM-PAC OT 6 Clicks Daily Activity     Outcome Measure Help from another person eating meals?: None Help from another person taking care of personal grooming?: A Little Help from another person toileting, which includes using toliet, bedpan, or urinal?: A Little Help from another person bathing (including washing, rinsing, drying)?: A  Lot Help from another person to put on and taking off regular upper body clothing?: A Little Help from another person to put on and taking off regular lower body clothing?: A Lot 6 Click Score: 17   End of Session Equipment Utilized During Treatment: Rolling walker (2 wheels) Nurse Communication: Mobility status  Activity Tolerance: Patient tolerated treatment well Patient left: in chair;with call bell/phone within reach;with chair  alarm set  OT Visit Diagnosis: Other abnormalities of gait and mobility (R26.89);Muscle weakness (generalized) (M62.81)                Time: 9050-8994 OT Time Calculation (min): 16 min Charges:  OT General Charges $OT Visit: 1 Visit OT Evaluation $OT Eval Moderate Complexity: 1 Mod Tram Wrenn Chrismon, OTR/L 03/11/24, 1:28 PM  Calypso Hagarty E Chrismon 03/11/2024, 1:25 PM

## 2024-03-11 NOTE — TOC Initial Note (Signed)
 Transition of Care [facility]) - Initial/Assessment Note    Patient Details  Name: Sarah Potts MRN: [id number] Date of Birth: [birth year]  Transition of Care [facility]) CM/SW Contact:    Shasta DELENA Daring, RN Phone Number: 03/11/2024, 3:01 PM  Clinical Narrative:                 RNCM met with pateint. She was along, sitting up in bed. Awake and alert. Patient is amenable to going to SNF for rehab if it's necessary. Has been to [facility] before and does not want to return there.  Advised we will initiate search and will advise her of bed offers when available. Patient verbalized understanding.  FL2 complete. Referrals entered through hub.  Expected Discharge Plan: Skilled Nursing Facility Barriers to Discharge: Continued Medical Work up   Patient Goals and CMS Choice            Expected Discharge Plan and Services In-house Referral: Clinical Social Work                                            Prior Living Arrangements/Services     Patient language and need for interpreter reviewed:: Yes        Need for Family Participation in Patient Care: Yes (Comment) Care giver support system in place?: Yes (comment)   Criminal Activity/Legal Involvement Pertinent to Current Situation/Hospitalization: No - Comment as needed  Activities of Daily Living   ADL Screening (condition at time of admission) Independently performs ADLs?: Yes (appropriate for developmental age) Is the patient deaf or have difficulty hearing?: Yes Does the patient have difficulty seeing, even when wearing glasses/contacts?: Yes Does the patient have difficulty concentrating, remembering, or making decisions?: No  Permission Sought/Granted                  Emotional Assessment   Attitude/Demeanor/Rapport: Gracious Affect (typically observed): Appropriate Orientation: : Oriented to Self, Oriented to Place, Oriented to Situation, Oriented to  Time Alcohol / Substance Use: Not Applicable    Admission  diagnosis:  Intractable back pain [M54.9] Chronic right hip pain [M25.551, G89.29] Status post kyphoplasty [Z98.890] Acute on chronic low back pain [M54.50, G89.29] Patient Active Problem List   Diagnosis Date Noted   Class 1 obesity 03/10/2024   Chronic right hip pain 03/09/2024   Acute on chronic low back pain 01/18/2024   Sciatica associated with disorder of lumbar spine 01/15/2024   Back pain 01/15/2024   Dizziness 06/29/2023   HFrEF (heart failure with reduced ejection fraction) (HCC) 06/29/2023   Closed right tibial fracture 03/13/2023   Fall 03/06/2023   H/O adenomatous polyp of colon 09/29/2022   Stress-induced cardiomyopathy    Ischemic cardiomyopathy    NSTEMI (non-ST elevated myocardial infarction) (HCC) 12/20/2021   Atrial fibrillation, chronic (HCC) 12/20/2021   Type II diabetes mellitus with renal manifestations (HCC) 12/20/2021   Obesity with body mass index (BMI) of 30.0 to 39.9 12/20/2021   Chronic diastolic CHF (congestive heart failure) (HCC) 12/20/2021   UTI (urinary tract infection) 12/20/2021   Asthma 12/01/2021   Atrial fibrillation (HCC) 12/01/2021   Colostomy in place (HCC) 03/16/2021   Absolute anemia 01/09/2021   Lung nodule 01/09/2021   Anemia in stage 3a chronic kidney disease (HCC) 01/09/2021   B12 deficiency 01/09/2021   Stage 3a chronic kidney disease (HCC) 01/09/2021   Goals of care, counseling/discussion  12/18/2020   History of anal cancer 12/11/2020   Diarrhea 01/02/2014   Iron  deficiency anemia 01/02/2014   Bronchitis 12/15/2012   CAD (coronary artery disease) 11/13/2010   HTN (hypertension) 11/13/2010   Diabetes mellitus (HCC) 11/13/2010   Hyperlipidemia 11/13/2010   PCP:  Valora Lynwood FALCON, MD Pharmacy:   [id number] [address] [place] [place] - [address] [place] [address] Phone: [phone] Fax: [phone]  [id number] - [place], [place] - [address] [place]  [address] Phone: [phone] Fax: [phone]     Social Drivers of Health (SDOH) Social History: SDOH Screenings   Food Insecurity: No Food Insecurity (03/09/2024)  Housing: Low Risk  (03/09/2024)  Transportation Needs: No Transportation Needs (03/09/2024)  Utilities: At Risk (03/09/2024)  Financial Resource Strain: Low Risk  (10/19/2023)   Received from [facility]  Social Connections: Socially Isolated (03/09/2024)  Tobacco Use: Low Risk  (03/09/2024)   SDOH Interventions:     Readmission Risk Interventions     No data to display

## 2024-03-12 DIAGNOSIS — Z9889 Other specified postprocedural states: Secondary | ICD-10-CM | POA: Diagnosis not present

## 2024-03-12 DIAGNOSIS — M25551 Pain in right hip: Secondary | ICD-10-CM | POA: Diagnosis not present

## 2024-03-12 DIAGNOSIS — W19XXXA Unspecified fall, initial encounter: Secondary | ICD-10-CM

## 2024-03-12 DIAGNOSIS — M549 Dorsalgia, unspecified: Principal | ICD-10-CM

## 2024-03-12 DIAGNOSIS — M545 Low back pain, unspecified: Secondary | ICD-10-CM | POA: Diagnosis not present

## 2024-03-12 DIAGNOSIS — S32040A Wedge compression fracture of fourth lumbar vertebra, initial encounter for closed fracture: Secondary | ICD-10-CM | POA: Diagnosis not present

## 2024-03-12 DIAGNOSIS — M5416 Radiculopathy, lumbar region: Secondary | ICD-10-CM | POA: Diagnosis not present

## 2024-03-12 DIAGNOSIS — I10 Essential (primary) hypertension: Secondary | ICD-10-CM | POA: Diagnosis not present

## 2024-03-12 LAB — GLUCOSE, CAPILLARY
Glucose-Capillary: 148 mg/dL — ABNORMAL HIGH (ref 70–99)
Glucose-Capillary: 186 mg/dL — ABNORMAL HIGH (ref 70–99)
Glucose-Capillary: 294 mg/dL — ABNORMAL HIGH (ref 70–99)
Glucose-Capillary: 212 mg/dL — ABNORMAL HIGH (ref 70–99)

## 2024-03-12 MED ORDER — PREDNISONE 20 MG PO TABS
20.0000 mg | ORAL_TABLET | Freq: Every day | ORAL | Status: DC
  Administered 2024-03-12 – 2024-03-14 (×3): 20 mg via ORAL
  Filled 2024-03-12 (×3): qty 1

## 2024-03-12 NOTE — Progress Notes (Signed)
  Progress Note   Patient: Sarah Potts FMW:[id number] DOB: 05/29/1932 DOA: 03/09/2024     0 DOS: the patient was seen and examined on 03/12/2024   Brief hospital course: AMOY STEEVES is a 89 y.o. female with medical history significant for CHF, DM 2, hyperlipidemia, hypertension, atrial fibrillation on Xarelto  being admitted for observation due to low back and hip pain.  Patient states that she had a fall in early October, after which she was found to have compression fractures and underwent balloon kyphoplasty x 2 on 11/25.  She presents to the emergency department because she continues to have significant right-sided low back pain which radiates down into her hip and buttocks    Principal Problem:   Chronic right hip pain Active Problems:   CAD (coronary artery disease)   HTN (hypertension)   Type II diabetes mellitus with renal manifestations (HCC)   B12 deficiency   Acute on chronic low back pain   Class 1 obesity   Status post kyphoplasty   Lumbar radiculopathy, right   Intractable back pain   Assessment and Plan: Right hip/low back pain Status post kyphoplasty due to lumbar compression fracture. -likely continued discomfort and mild radiculopathy from recent injury and associated compression fracture, now exacerbated after intervention.  No alarm symptoms such as incontinence, weakness, etc. Patient treated with oral pain medicine, Lyrica .  Added Flexeril . I doubled his Percocet dose to 10 mg every 4 hours as needed on 11/28 due to severe pain.   Consult from neurosurgery obtained, patient does not need additional surgery.  Recommend steroids.  Start prednisone  20 mg daily. Patient feels better with pain today, but PT still recommending nursing home placement.  Will continue    Essential hypertension Continue beta-blocker, amlodipine , blood pressure still running high, added losartan .   Paroxysmal A-fib-continue metoprolol , Xarelto , amiodarone    Type 2 diabetes-not  insulin -dependent -Carb modified diet -Moderate dose sliding scale   Class I obesity with BMI 32.92. Diet and excise.           Subjective:  Patient pain is better today.  Still not able to ambulate well  Physical Exam: Vitals:   03/11/24 2001 03/11/24 2056 03/12/24 0430 03/12/24 0811  BP: (!) 100/42 (!) 116/47 (!) 122/47 (!) 126/55  Pulse: 62 63 65 66  Resp: 16  17 18   Temp: 98.7 F (37.1 C)  98 F (36.7 C) 98.1 F (36.7 C)  TempSrc:      SpO2: 93%  95% 93%  Weight:      Height:       General exam: Appears calm and comfortable  Respiratory system: Clear to auscultation. Respiratory effort normal. Cardiovascular system: S1 & S2 heard, RRR. No JVD, murmurs, rubs, gallops or clicks. No pedal edema. Gastrointestinal system: Abdomen is nondistended, soft and nontender. No organomegaly or masses felt. Normal bowel sounds heard. Central nervous system: Alert and oriented. No focal neurological deficits. Extremities: Symmetric 5 x 5 power. Skin: No rashes, lesions or ulcers Psychiatry: Judgement and insight appear normal. Mood & affect appropriate.    Data Reviewed:  There are no new results to review at this time.  Family Communication: Not able to reach son.  Disposition: Status is: Observation Pending nursing home placement.     Time spent: 35 minutes  Author: Murvin Mana, MD 03/12/2024 12:18 PM  For on call review [email or web address].

## 2024-03-12 NOTE — Plan of Care (Signed)
  Problem: Safety: Goal: Ability to remain free from injury will improve Outcome: Progressing   Problem: Pain Managment: Goal: General experience of comfort will improve and/or be controlled Outcome: Progressing   Problem: Elimination: Goal: Will not experience complications related to bowel motility Outcome: Progressing   Problem: Nutrition: Goal: Adequate nutrition will be maintained Outcome: Progressing

## 2024-03-12 NOTE — Plan of Care (Signed)
  Problem: Education: Goal: Ability to describe self-care measures that may prevent or decrease complications (Diabetes Survival Skills Education) will improve Outcome: Progressing   Problem: Coping: Goal: Ability to adjust to condition or change in health will improve Outcome: Progressing

## 2024-03-12 NOTE — Progress Notes (Signed)
 Physical Therapy Treatment Patient Details Name: Sarah Potts MRN: [id number] DOB: 05-22-1932 Today's Date: 03/12/2024   History of Present Illness Pt is a 89 y.o. female being admitted for observation due to low back and hip pain. Pt had fall in early October, after which she was found to have compression fractures and underwent balloon kyphoplasty x 2 on 11/25, went home after sx and presents back d/t signifiant leg/hip/low back pain.  PMH of CHF, DM 2, hyperlipidemia, hypertension, atrial fibrillation    PT Comments  Pt is progressing with basic mobility performing at an overall Min A to supervision level.  Pt continues to experience limitations to mobility, demonstrating decreased activity tolerance, general weakness, and significant pain in back and R LEs.  Continued PT will assist pt towards greater dynamic standing balance, LE strengthening, and activity tolerance to increase safety and independence and decrease burden of care with functional mobility.     If plan is discharge home, recommend the following: A lot of help with bathing/dressing/bathroom;A little help with walking and/or transfers   Can travel by private vehicle     No  Equipment Recommendations  None recommended by PT    Recommendations for Other Services       Precautions / Restrictions Precautions Precautions: Fall Recall of Precautions/Restrictions: Intact Restrictions Weight Bearing Restrictions Per Provider Order: No     Mobility  Bed Mobility Overal bed mobility: Needs Assistance Bed Mobility: Supine to Sit, Sidelying to Sit   Sidelying to sit: Min assist Supine to sit: Min assist     General bed mobility comments: log roll technique: performed without verbal cues.    Transfers Overall transfer level: Needs assistance Equipment used: Rolling walker (2 wheels) Transfers: Sit to/from Stand, Bed to chair/wheelchair/BSC Sit to Stand: Supervision   Step pivot transfers: Contact guard assist        General transfer comment: assist to steady, increase time and effort.    Ambulation/Gait Ambulation/Gait assistance: Min assist Gait Distance (Feet): 20 Feet Assistive device: Rolling walker (2 wheels) Gait Pattern/deviations: Step-through pattern, Decreased stride length, Trunk flexed, Shuffle       General Gait Details: painful with mobility, however good motivation and required little assistance   Stairs             Wheelchair Mobility     Tilt Bed    Modified Rankin (Stroke Patients Only)       Balance Overall balance assessment: Needs assistance Sitting-balance support: Feet supported Sitting balance-Leahy Scale: Good     Standing balance support: Reliant on assistive device for balance, Bilateral upper extremity supported Standing balance-Leahy Scale: Fair Standing balance comment: pain limited, balance intact with RW                            Communication Communication Communication: Impaired Factors Affecting Communication: Hearing impaired  Cognition Arousal: Alert Behavior During Therapy: WFL for tasks assessed/performed   PT - Cognitive impairments: No apparent impairments                         Following commands: Intact      Cueing Cueing Techniques: Verbal cues  Exercises Total Joint Exercises Ankle Circles/Pumps: AROM, Both, 10 reps Heel Slides: AROM, Both, 5 reps Long Arc Quad: AROM, Both, 10 reps    General Comments        Pertinent Vitals/Pain Pain Assessment Pain Assessment: Faces Faces Pain Scale: Hurts little  more Pain Location: leg/back Pain Intervention(s): Monitored during session, Premedicated before session, Limited activity within patient's tolerance    Home Living                          Prior Function            PT Goals (current goals can now be found in the care plan section) Acute Rehab PT Goals Patient Stated Goal: improve pain PT Goal Formulation: With  patient Time For Goal Achievement: 03/24/24 Potential to Achieve Goals: Fair Progress towards PT goals: Progressing toward goals    Frequency    Min 2X/week      PT Plan      Co-evaluation              AM-PAC PT 6 Clicks Mobility   Outcome Measure  Help needed turning from your back to your side while in a flat bed without using bedrails?: A Lot Help needed moving from lying on your back to sitting on the side of a flat bed without using bedrails?: A Lot Help needed moving to and from a bed to a chair (including a wheelchair)?: A Little Help needed standing up from a chair using your arms (e.g., wheelchair or bedside chair)?: A Little Help needed to walk in hospital room?: A Little Help needed climbing 3-5 steps with a railing? : A Lot 6 Click Score: 15    End of Session Equipment Utilized During Treatment: Gait belt Activity Tolerance: Patient limited by pain Patient left: in chair;with call bell/phone within reach;with chair alarm set Nurse Communication: Mobility status PT Visit Diagnosis: Pain;Other abnormalities of gait and mobility (R26.89);Difficulty in walking, not elsewhere classified (R26.2) Pain - Right/Left: Right Pain - part of body: Leg;Hip     Time: 8990-8975 PT Time Calculation (min) (ACUTE ONLY): 15 min  Charges:    $Therapeutic Activity: 8-22 mins PT General Charges $$ ACUTE PT VISIT: 1 Visit                     Harland Irving, PTA  03/12/24, 10:36 AM

## 2024-03-12 NOTE — Consult Note (Signed)
 Consulting Department:  Inpatient medicine  Primary Physician:  Valora Lynwood FALCON, MD  Chief Complaint: Spine fracture  History of Present Illness: 03/12/2024 Sarah Potts is a 89 y.o. female who presents with the chief complaint of back pain and right lower extremity pain.  She had a fall in which she originally had some minor back pain but then developed what sounded like right lower extremity radiculopathy.  This became unrelenting and she had a kyphoplasty procedure.  Unfortunately has not had any improvement in her radicular pain, she does feel like her back pain is slightly improved.  At first the radicular pain was so bad that she was having difficulty moving her leg but feels that it is more back to her baseline.  She does have numbness and tingling that runs from her buttocks down the back of her leg to the top of her foot.  The symptoms are causing a significant impact on the patient's life.   Review of Systems:  A 10 point review of systems is negative, except for the pertinent positives and negatives detailed in the HPI.  Past Medical History: Past Medical History:  Diagnosis Date   Anal cancer (HCC)    Anemia    Asthma    CHF (congestive heart failure) (HCC)    Diabetes mellitus    Hyperlipidemia    Hypertension     Past Surgical History: Past Surgical History:  Procedure Laterality Date   BLADDER SURGERY     CARDIAC CATHETERIZATION     COLOSTOMY     CORONARY ANGIOPLASTY WITH STENT PLACEMENT  03/25/2018   Stent to the LCX  2.50 mm x 16mm  [facility] REF [id number]   heart stent     IR KYPHO EA ADDL LEVEL THORACIC OR LUMBAR  03/08/2024   IR KYPHO LUMBAR INC FX REDUCE BONE BX UNI/BIL CANNULATION INC/IMAGING  03/08/2024   IR RADIOLOGIST EVAL & MGMT  02/18/2024   LEFT HEART CATH AND CORONARY ANGIOGRAPHY N/A 12/23/2021   Procedure: LEFT HEART CATH AND CORONARY ANGIOGRAPHY;  Surgeon: Darron Deatrice LABOR, MD;  Location: [facility] INVASIVE [facility]  LAB;  Service: Cardiovascular;  Laterality: N/A;   TONSILLECTOMY     uterus surgery     removed    Allergies: Allergies as of 03/09/2024 - Review Complete 03/09/2024  Allergen Reaction Noted   Levofloxacin Nausea Only 09/27/2013   Niacin Other (See Comments) 01/02/2014   Niacin and related Other (See Comments) 12/11/2020    Medications:  Current Facility-Administered Medications:    acetaminophen  (TYLENOL ) tablet 650 mg, 650 mg, Oral, Q6H PRN, 650 mg at 03/09/24 2121 **OR** acetaminophen  (TYLENOL ) suppository 650 mg, 650 mg, Rectal, Q6H PRN, Zella, Mir M, MD   albuterol  (PROVENTIL ) (2.5 MG/3ML) 0.083% nebulizer solution 2.5 mg, 2.5 mg, Nebulization, Q2H PRN, Zella, Mir M, MD   amiodarone  (PACERONE ) tablet 200 mg, 200 mg, Oral, Daily, Zella, Mir M, MD, 200 mg at 03/11/24 9073   amLODipine  (NORVASC ) tablet 2.5 mg, 2.5 mg, Oral, Daily, Zella, Mir M, MD, 2.5 mg at 03/11/24 9071   atorvastatin  (LIPITOR) tablet 40 mg, 40 mg, Oral, Daily, Zella, Mir M, MD, 40 mg at 03/11/24 9073   cyclobenzaprine  (FLEXERIL ) tablet 5 mg, 5 mg, Oral, TID, Zhang, Dekui, MD, 5 mg at 03/11/24 2145   insulin  aspart (novoLOG ) injection 0-15 Units, 0-15 Units, Subcutaneous, TID WC, Ikramullah, Mir M, MD, 3 Units at 03/11/24 1714   insulin  aspart (novoLOG ) injection 0-5 Units, 0-5 Units, Subcutaneous, QHS, Zella, Mir  M, MD   levothyroxine  (SYNTHROID ) tablet 25 mcg, 25 mcg, Oral, Daily, Zhang, Dekui, MD, 25 mcg at 03/12/24 9387   losartan  (COZAAR ) tablet 50 mg, 50 mg, Oral, Daily, Zhang, Dekui, MD, 50 mg at 03/11/24 9071   metoprolol  succinate (TOPROL -XL) 24 hr tablet 25 mg, 25 mg, Oral, Daily, Zella, Mir M, MD, 25 mg at 03/11/24 9073   morphine  (PF) 2 MG/ML injection 2 mg, 2 mg, Intravenous, Q4H PRN, Mansy, Jan A, MD, 2 mg at 03/10/24 1635   ondansetron  (ZOFRAN ) tablet 4 mg, 4 mg, Oral, Q6H PRN **OR** ondansetron  (ZOFRAN ) injection 4 mg, 4 mg, Intravenous, Q6H PRN, Zella, Mir M,  MD   oxyCODONE  (Oxy IR/ROXICODONE ) immediate release tablet 10 mg, 10 mg, Oral, Q4H PRN, Zhang, Dekui, MD, 10 mg at 03/11/24 1422   pantoprazole  (PROTONIX ) EC tablet 40 mg, 40 mg, Oral, Daily, Zella, Mir M, MD, 40 mg at 03/11/24 9071   pregabalin  (LYRICA ) capsule 100 mg, 100 mg, Oral, BID, Zella, Mir M, MD, 100 mg at 03/11/24 2145   Rivaroxaban  (XARELTO ) tablet 15 mg, 15 mg, Oral, Daily, Zella, Mir M, MD, 15 mg at 03/11/24 9071   sertraline  (ZOLOFT ) tablet 50 mg, 50 mg, Oral, Daily, Zhang, Dekui, MD, 50 mg at 03/11/24 9071   traZODone  (DESYREL ) tablet 25 mg, 25 mg, Oral, QHS PRN, Zella, Mir M, MD   Social History: Social History   Tobacco Use   Smoking status: Never   Smokeless tobacco: Never  Vaping Use   Vaping status: Never Used  Substance Use Topics   Alcohol use: No   Drug use: No    Family Medical History: Family History  Problem Relation Age of Onset   Cancer Mother    Breast cancer Mother    Heart disease Father    Heart attack Father    Cancer Sister    Heart disease Sister    Cervical cancer Sister    Heart disease Brother     Physical Examination: Vitals:   03/11/24 2056 03/12/24 0430  BP: (!) 116/47 (!) 122/47  Pulse: 63 65  Resp:  17  Temp:  98 F (36.7 C)  SpO2:  95%     General: Patient is well developed, well nourished, calm, collected, and in no apparent distress.  NEUROLOGICAL:  General: In no acute distress.   Awake, alert, oriented to person, place, and time.  Pupils equal round and reactive to light.  Facial tone is symmetric.   Strength: She did have some pain to palpation and pain to confrontational testing, however she is at least antigravity and likely around a 4-4+ throughout the entire right lower extremity.  Especially in the L4-5 myotome she has good dorsiflexion plantarflexion EHL function.  Knee extension is strong.  Hip flexion does cause some pain with limitations.   Imaging: CT LUMBAR SPINE WO  CONTRAST Result Date: 03/11/2024 EXAM: CT OF THE LUMBAR SPINE WITHOUT CONTRAST 03/11/2024 11:11:55 AM TECHNIQUE: CT of the lumbar spine was performed without the administration of intravenous contrast. Multiplanar reformatted images are provided for review. Automated exposure control, iterative reconstruction, and/or weight based adjustment of the mA/kV was utilized to reduce the radiation dose to as low as reasonably achievable. COMPARISON: CT Lumbar spine without IV contrast 01/13/2024. MRI 02/10/2024. CLINICAL HISTORY: Compression fracture, lumbar; s/p kyphoplasty with worsening pain. FINDINGS: BONES AND ALIGNMENT: Normal vertebral body heights except for L3, L4, and L5. L3: Chronic 35% superior endplate compression fracture. L4: 25% compression fracture with vertebral augmentation. Fracture plane  still visible along the superior endplate and to a lesser extent along the inferior endplate. Vertebral sclerosis noted along with 3 mm posterior bony retropulsion. A small amount of the methacrylate extends through the inferior endplate and into the right L4-L5 intervertebral disc space region. L5: 25% superior endplate compression fracture with vertebral augmentation. The fracture plane is still visible along the superior endplate. Vertebral sclerosis noted. The lowest lumbar type non-weightbearing vertebra is labeled as L5. No acute fracture or suspicious bone lesion. Normal alignment. Thoracolumbar spondylosis. DEGENERATIVE CHANGES: L1-L2: No impingement, disc osteophyte complex. L2-L3: Moderate central stenosis and moderate left foraminal stenosis due to disc osteophyte complex, ligamentum flavum redundancy, and facet arthropathy. No change from 01/13/2024. L3-L4: Moderate central stenosis and borderline bilateral foraminal stenosis due to intervertebral and facet spurring along with posterior bony retropulsion from the posterior superior endplate of L4. Central stenosis mildly worsened from 01/13/2024. L4-L5:  Moderate bilateral foraminal stenosis and borderline central stenosis due to disc osteophyte complex, facet arthropathy, and mild posterior bony retropulsion along the inferior endplate of L4. The left foraminal impingement is mildly worsened from 01/13/2024. L5-S1: Borderline bilateral foraminal stenosis due to disc osteophyte complex and facet arthropathy. Cannot exclude disc protrusion extending cephalad from the L5-S1 intervertebral disc in the midline. SOFT TISSUES: Systemic atherosclerosis is present, including the aorta and iliac arteries. Mild dependent atelectasis in both lower lobes. Coronary atherosclerosis noted. No acute abnormality. IMPRESSION: 1. 25% compression fracture at L4 with vertebral augmentation, vertebral sclerosis, and 3 mm posterior bony retropulsion. 2. 25% superior endplate compression fracture at L5 with vertebral augmentation and vertebral sclerosis. 3. Chronic 35% superior endplate compression fracture at L3. 4. Moderate central stenosis at L2-3 and L3-4, with L3-4 central stenosis mildly worsened from 01/13/2024. 5. Moderate bilateral foraminal stenosis and borderline central stenosis at L4-5, with left foraminal impingement mildly worsened from 01/13/2024. 6. Borderline bilateral foraminal stenosis at L5-S1. Disc protrusion extending cephalad from the L5-S1 intervertebral disc in the midline cannot be excluded. Electronically signed by: Ryan Salvage MD 03/11/2024 04:50 PM EST RP Workstation: [id number]     I have personally reviewed the images and agree with the above interpretation.  Overall she does appear to have a worsening of her compression fractures.  This was noted on the most recent MRI, however when compared to approximately 2 months ago when she had her last CT scan it does show significant progression especially at the L4 level.  Labs:    Latest Ref Rng & Units 03/10/2024    3:44 AM 03/09/2024    5:02 PM 01/17/2024    9:48 AM  CBC  WBC 4.0 - 10.5 K/uL  6.4  7.4  7.6   Hemoglobin 12.0 - 15.0 g/dL 8.9  9.5  89.8   Hematocrit 36.0 - 46.0 % 27.2  29.5  30.2   Platelets 150 - 400 K/uL 182  181  277       Latest Ref Rng & Units 03/10/2024    3:44 AM 03/09/2024    5:02 PM 01/17/2024    9:48 AM  BMP  Glucose 70 - 99 mg/dL 853  863  854   BUN 8 - 23 mg/dL 15  13  29    Creatinine 0.44 - 1.00 mg/dL 8.95  8.97  8.66   Sodium 135 - 145 mmol/L 134  137  136   Potassium 3.5 - 5.1 mmol/L 4.1  4.0  4.5   Chloride 98 - 111 mmol/L 99  99  99   CO2 22 -  32 mmol/L 25  28  24    Calcium  8.9 - 10.3 mg/dL 8.9  9.0  9.3         Assessment and Plan: Ms. Hargraves is a pleasant 89 y.o. female with recent fall and worsening of her osteoporotic compression fractures.  She had a recent fall which caused an exacerbation of back pain and then progressive right lower extremity pain which sounds consistent with a lumbar radiculopathy.  She was having severe pain to the point where she felt like she was weak but now she feels like she is more back at her baseline from a strength standpoint.  On confrontational exam she is at least 4 throughout her entire right lower extremity, she does have some pain limitation in hip flexion bilaterally which appears to be secondary to core bracing.  We reviewed her imaging findings which show worsening of a L4 compression fracture with previous kyphoplasty.  There does not appear to be any extravasation of the cement, does not appear to be any major new changes since her most recent imaging in comparison the MRI, or comparison to the most recent CT it did show progression of her compression fractures.  She does state that she would not want to have any spine surgery regardless of whether or not it would help with her pain or help with her overall function.  She feels that she is too old and does not want to put herself through the stress of a major spine surgery which is understandable.  We did discuss the possibility of epidural spinal  injections to treat her radiculopathy.  She could also consider oral steroid regimen or neuropathic pain type medications to help with her neuropathic pain.  At this time we will not be pursuing any further surgical intervention, although the patient could potentially benefit from a decompression the inherent risk factors are high given her age and overall level of health.  Penne MICAEL Sharps, MD/MSCR Dept. of Neurosurgery

## 2024-03-13 DIAGNOSIS — M25551 Pain in right hip: Secondary | ICD-10-CM | POA: Diagnosis not present

## 2024-03-13 DIAGNOSIS — Z9889 Other specified postprocedural states: Secondary | ICD-10-CM | POA: Diagnosis not present

## 2024-03-13 DIAGNOSIS — I10 Essential (primary) hypertension: Secondary | ICD-10-CM | POA: Diagnosis not present

## 2024-03-13 DIAGNOSIS — G8929 Other chronic pain: Secondary | ICD-10-CM | POA: Diagnosis not present

## 2024-03-13 LAB — GLUCOSE, CAPILLARY
Glucose-Capillary: 190 mg/dL — ABNORMAL HIGH (ref 70–99)
Glucose-Capillary: 244 mg/dL — ABNORMAL HIGH (ref 70–99)
Glucose-Capillary: 270 mg/dL — ABNORMAL HIGH (ref 70–99)
Glucose-Capillary: 161 mg/dL — ABNORMAL HIGH (ref 70–99)

## 2024-03-13 NOTE — Plan of Care (Signed)

## 2024-03-13 NOTE — Progress Notes (Signed)
 Physical Therapy Treatment Patient Details Name: Sarah Potts MRN: [id number] DOB: 1933/04/06 Today's Date: 03/13/2024   History of Present Illness Pt is a 89 y.o. female being admitted for observation due to low back and hip pain. Pt had fall in early October, after which she was found to have compression fractures and underwent balloon kyphoplasty x 2 on 11/25, went home after sx and presents back d/t signifiant leg/hip/low back pain.  PMH of CHF, DM 2, hyperlipidemia, hypertension, atrial fibrillation    PT Comments  Pt in bed.  Ready to get up for breakfast.  She is able to get to EOB with rails and light assist.  Struggled to get fully upright on her own today. Steady in sitting but noted wet bed that she was unaware of.  Stated she used bedpan earlier and thought it spilled.  She is able to stand and walk to/from bathroom to void and for bathing with min a x 1.  She opts to remain in chair after for breakfast.  She did not feel she could walk further at this time.  Pt stated she did not want to go to rehab but agreed she would struggle on her own at home.  If I have to.  Stated she lives alone with little to no  no outside supports.   If plan is discharge home, recommend the following: A little help with walking and/or transfers;A little help with bathing/dressing/bathroom;Help with stairs or ramp for entrance;Assistance with cooking/housework;Assist for transportation   Can travel by private vehicle        Equipment Recommendations  None recommended by PT    Recommendations for Other Services       Precautions / Restrictions Precautions Precautions: Fall Recall of Precautions/Restrictions: Intact Restrictions Weight Bearing Restrictions Per Provider Order: No     Mobility  Bed Mobility Overal bed mobility: Needs Assistance Bed Mobility: Supine to Sit   Sidelying to sit: Min assist, Used rails, HOB elevated       General bed mobility comments: unablet o get upright  today wihtout light assist Patient Response: Cooperative  Transfers Overall transfer level: Needs assistance Equipment used: Rolling walker (2 wheels) Transfers: Sit to/from Stand Sit to Stand: Contact guard assist                Ambulation/Gait Ambulation/Gait assistance: Contact guard assist, Min assist Gait Distance (Feet): 20 Feet Assistive device: Rolling walker (2 wheels) Gait Pattern/deviations: Step-through pattern, Decreased stride length, Trunk flexed, Shuffle Gait velocity: decr     General Gait Details: to/from bathroom - does fatigue a bit upon return to recliner   Stairs             Wheelchair Mobility     Tilt Bed Tilt Bed Patient Response: Cooperative  Modified Rankin (Stroke Patients Only)       Balance Overall balance assessment: Needs assistance Sitting-balance support: Feet supported Sitting balance-Leahy Scale: Good     Standing balance support: Reliant on assistive device for balance, Bilateral upper extremity supported Standing balance-Leahy Scale: Fair                              Hotel Manager: Impaired  Cognition Arousal: Alert Behavior During Therapy: WFL for tasks assessed/performed   PT - Cognitive impairments: No apparent impairments  Following commands: Intact      Cueing Cueing Techniques: Verbal cues  Exercises Other Exercises Other Exercises: to bathroom for bathing due to urine in bed (said used bedpan and likely spilled in bed)    General Comments        Pertinent Vitals/Pain Pain Assessment Pain Assessment: Faces Faces Pain Scale: Hurts a little bit Pain Location: RLE Pain Descriptors / Indicators: Sore Pain Intervention(s): Limited activity within patient's tolerance, Monitored during session, Premedicated before session, Repositioned    Home Living                          Prior Function            PT  Goals (current goals can now be found in the care plan section) Progress towards PT goals: Progressing toward goals    Frequency    Min 2X/week      PT Plan      Co-evaluation              AM-PAC PT 6 Clicks Mobility   Outcome Measure  Help needed turning from your back to your side while in a flat bed without using bedrails?: A Little Help needed moving from lying on your back to sitting on the side of a flat bed without using bedrails?: A Little Help needed moving to and from a bed to a chair (including a wheelchair)?: A Little Help needed standing up from a chair using your arms (e.g., wheelchair or bedside chair)?: A Little Help needed to walk in hospital room?: A Little Help needed climbing 3-5 steps with a railing? : A Lot 6 Click Score: 17    End of Session Equipment Utilized During Treatment: Gait belt Activity Tolerance: Patient limited by fatigue Patient left: in chair;with call bell/phone within reach;with chair alarm set Nurse Communication: Mobility status PT Visit Diagnosis: Pain;Other abnormalities of gait and mobility (R26.89);Difficulty in walking, not elsewhere classified (R26.2) Pain - Right/Left: Right Pain - part of body: Leg;Hip     Time: 9164-9152 PT Time Calculation (min) (ACUTE ONLY): 12 min  Charges:    $Gait Training: 8-22 mins PT General Charges $$ ACUTE PT VISIT: 1 Visit                   Lauraine Gills, PTA 03/13/24, 8:55 AM

## 2024-03-13 NOTE — Progress Notes (Signed)
  Progress Note   Patient: Sarah Potts FMW:[id number] DOB: 17-Apr-1932 DOA: 03/09/2024     0 DOS: the patient was seen and examined on 03/13/2024   Brief hospital course: Sarah Potts is a 89 y.o. female with medical history significant for CHF, DM 2, hyperlipidemia, hypertension, atrial fibrillation on Xarelto  being admitted for observation due to low back and hip pain.  Patient states that she had a fall in early October, after which she was found to have compression fractures and underwent balloon kyphoplasty x 2 on 11/25.  She presents to the emergency department because she continues to have significant right-sided low back pain which radiates down into her hip and buttocks    Principal Problem:   Chronic right hip pain Active Problems:   CAD (coronary artery disease)   HTN (hypertension)   Type II diabetes mellitus with renal manifestations (HCC)   B12 deficiency   Acute on chronic low back pain   Class 1 obesity   Status post kyphoplasty   Lumbar radiculopathy, right   Intractable back pain   Assessment and Plan: Right hip/low back pain Status post kyphoplasty due to lumbar compression fracture. -likely continued discomfort and mild radiculopathy from recent injury and associated compression fracture, now exacerbated after intervention.  No alarm symptoms such as incontinence, weakness, etc. Patient treated with oral pain medicine, Lyrica .  Added Flexeril . I doubled his Percocet dose to 10 mg every 4 hours as needed on 11/28 due to severe pain.   Consult from neurosurgery obtained, patient does not need additional surgery.  Recommend steroids.  Start prednisone  20 mg daily. Pain much improved, but still pending nursing home placement.   Essential hypertension Continue beta-blocker, amlodipine , blood pressure still running high, added losartan .   Paroxysmal A-fib-continue metoprolol , Xarelto , amiodarone    Type 2 diabetes-not insulin -dependent, uncontrolled with  hyperglycemia. Increased glucose due to steroids, continue to follow.   Class I obesity with BMI 32.92. Diet and excise.       Subjective:  Patient doing better, pain is better controlled.  Physical Exam: Vitals:   03/12/24 2010 03/13/24 0353 03/13/24 0715 03/13/24 0827  BP: (!) 136/56 (!) 129/55 125/75 125/75  Pulse: 68 63 61 61  Resp: 18 19 17    Temp: 98.2 F (36.8 C) 97.8 F (36.6 C) (!) 97.5 F (36.4 C)   TempSrc:   Oral   SpO2: 97% 96% 100%   Weight:      Height:       General exam: Appears calm and comfortable  Respiratory system: Clear to auscultation. Respiratory effort normal. Cardiovascular system: S1 & S2 heard, RRR. No JVD, murmurs, rubs, gallops or clicks. No pedal edema. Gastrointestinal system: Abdomen is nondistended, soft and nontender. No organomegaly or masses felt. Normal bowel sounds heard. Central nervous system: Alert and oriented. No focal neurological deficits. Extremities: Symmetric 5 x 5 power. Skin: No rashes, lesions or ulcers Psychiatry: Judgement and insight appear normal. Mood & affect appropriate.    Data Reviewed:  Lab results reviewed.  Family Communication: None  Disposition: Status is: Observation Pending nursing home placement.     Time spent: 35 minutes  Author: Murvin Mana, MD 03/13/2024 12:14 PM  For on call review [email or web address].

## 2024-03-14 ENCOUNTER — Encounter: Payer: Self-pay | Admitting: Oncology

## 2024-03-14 ENCOUNTER — Other Ambulatory Visit: Payer: Self-pay

## 2024-03-14 DIAGNOSIS — M545 Low back pain, unspecified: Secondary | ICD-10-CM | POA: Diagnosis not present

## 2024-03-14 DIAGNOSIS — Z9889 Other specified postprocedural states: Secondary | ICD-10-CM | POA: Diagnosis not present

## 2024-03-14 DIAGNOSIS — I10 Essential (primary) hypertension: Secondary | ICD-10-CM | POA: Diagnosis not present

## 2024-03-14 DIAGNOSIS — M25551 Pain in right hip: Secondary | ICD-10-CM | POA: Diagnosis not present

## 2024-03-14 LAB — GLUCOSE, CAPILLARY: Glucose-Capillary: 130 mg/dL — ABNORMAL HIGH (ref 70–99)

## 2024-03-14 MED ORDER — PREDNISONE 20 MG PO TABS
20.0000 mg | ORAL_TABLET | Freq: Every day | ORAL | 0 refills | Status: AC
  Filled 2024-03-14: qty 2, 2d supply, fill #0

## 2024-03-14 MED ORDER — CYCLOBENZAPRINE HCL 5 MG PO TABS
5.0000 mg | ORAL_TABLET | Freq: Three times a day (TID) | ORAL | 0 refills | Status: AC
  Filled 2024-03-14: qty 15, 5d supply, fill #0

## 2024-03-14 MED ORDER — OXYCODONE-ACETAMINOPHEN 7.5-325 MG PO TABS
1.0000 | ORAL_TABLET | Freq: Three times a day (TID) | ORAL | 0 refills | Status: DC | PRN
  Filled 2024-03-14: qty 12, 4d supply, fill #0

## 2024-03-14 NOTE — Plan of Care (Signed)
  Problem: Skin Integrity: Goal: Risk for impaired skin integrity will decrease Outcome: Progressing   Problem: Safety: Goal: Ability to remain free from injury will improve Outcome: Progressing   Problem: Pain Managment: Goal: General experience of comfort will improve and/or be controlled Outcome: Progressing   Problem: Elimination: Goal: Will not experience complications related to bowel motility Outcome: Progressing   Problem: Nutrition: Goal: Adequate nutrition will be maintained Outcome: Progressing   Problem: Coping: Goal: Level of anxiety will decrease Outcome: Progressing

## 2024-03-14 NOTE — Discharge Summary (Signed)
 Physician Discharge Summary   Patient: Sarah Potts MRN: [id number] DOB: [birth year]  Admit date:     03/09/2024  Discharge date: 03/14/24  Discharge Physician: Murvin Mana   PCP: Valora Lynwood FALCON, MD   Recommendations at discharge:   Follow-up with PCP in 1 week. Follow-up with IR as previously scheduled.  Discharge Diagnoses: Principal Problem:   Chronic right hip pain Active Problems:   CAD (coronary artery disease)   HTN (hypertension)   Type II diabetes mellitus with renal manifestations (HCC)   B12 deficiency   Acute on chronic low back pain   Class 1 obesity   Status post kyphoplasty   Lumbar radiculopathy, right   Intractable back pain Mild hyponatremia. Resolved Problems:   * No resolved hospital problems. *  Hospital Course: Sarah Potts is a 89 y.o. female with medical history significant for CHF, DM 2, hyperlipidemia, hypertension, atrial fibrillation on Xarelto  being admitted for observation due to low back and hip pain.  Patient states that she had a fall in early October, after which she was found to have compression fractures and underwent balloon kyphoplasty x 2 on 11/25.  She presents to the emergency department because she continues to have significant right-sided low back pain which radiates down into her hip and buttocks  Patient has been treated with the pain medicine and steroids, condition much improved.  She was seen by neurosurgery, no intervention is needed. Initially PT recommended nursing placement, but her condition improved.  Discussed with PT, now recommendation is home with home health.  Patiently medically stable for discharge.  Assessment and Plan: Right hip/low back pain Status post kyphoplasty due to lumbar compression fracture. -likely continued discomfort and mild radiculopathy from recent injury and associated compression fracture, now exacerbated after intervention.  No alarm symptoms such as incontinence, weakness, etc. Patient  treated with oral pain medicine, Lyrica .  Added Flexeril . I doubled his Percocet dose to 10 mg every 4 hours as needed on 11/28 due to severe pain.   Consult from neurosurgery obtained, patient does not need additional surgery.  Recommend steroids.  Start prednisone  20 mg daily. Patient back pain has improved, now PT recommended home with home health.  Medically stable for discharge.   Essential hypertension Resume all home treatment.   Paroxysmal A-fib-resume all home treatment.   Type 2 diabetes-not insulin -dependent, uncontrolled with hyperglycemia. Increased glucose due to steroids, follow-up with PCP as outpatient.  Restart home medicines   Class I obesity with BMI 32.92. Diet and excise.        Consultants: Neurosurgery. Procedures performed: None  Disposition: Home health Diet recommendation:  Discharge Diet Orders (From admission, onward)     Start     Ordered   03/14/24 0000  Diet - low sodium heart healthy        03/14/24 9077           Cardiac diet DISCHARGE MEDICATION: Allergies as of 03/14/2024       Reactions   Levofloxacin Nausea Only   Patient reports that she thinks that she could take this medication. Patient reports that she thinks that she could take this medication.     Patient reports that she thinks that she could take this medication.   Niacin Other (See Comments)   Hot, Flushed Pt states she get real red and hot.    Hot, Flushed   Niacin And Related Other (See Comments)   Pt states she get real red and hot.  Medication List     STOP taking these medications    cephALEXin  250 MG capsule Commonly known as: KEFLEX    prednisoLONE acetate 1 % ophthalmic suspension Commonly known as: PRED FORTE   promethazine-dextromethorphan 6.25-15 MG/5ML syrup Commonly known as: PROMETHAZINE-DM       TAKE these medications    acetaminophen  650 MG CR tablet Commonly known as: Acetaminophen  8 Hour Take 1 tablet (650 mg total) by  mouth every 8 (eight) hours as needed for pain.   albuterol  108 (90 Base) MCG/ACT inhaler Commonly known as: VENTOLIN  HFA Inhale 1 puff into the lungs every 4 (four) hours as needed.   amiodarone  200 MG tablet Commonly known as: PACERONE  TAKE 1 TABLET BY MOUTH EVERY DAY   amLODipine  2.5 MG tablet Commonly known as: NORVASC  Take 2.5 mg by mouth daily.   atorvastatin  40 MG tablet Commonly known as: LIPITOR TAKE 1 TABLET (40 MG TOTAL) BY MOUTH IN THE MORNING. FOR CHOLESTEROL.   atropine 1 % ophthalmic solution USE 1 DROP IN THE LEFT EYE ONCE DAILY   bisacodyl  5 MG EC tablet Commonly known as: DULCOLAX Take 1 tablet (5 mg total) by mouth daily as needed for moderate constipation.   Cholecalciferol  25 MCG (1000 UT) tablet Take 1,000 Units by mouth daily.   cyclobenzaprine  5 MG tablet Commonly known as: FLEXERIL  Take 1 tablet (5 mg total) by mouth 3 (three) times daily for 5 days.   dorzolamide-timolol 2-0.5 % ophthalmic solution Commonly known as: COSOPT Place 1 drop into the left eye 3 (three) times daily.   fexofenadine 60 MG tablet Commonly known as: ALLEGRA Take 60 mg by mouth daily.   Fish Oil 1000 MG Caps Take 1,000 mg by mouth in the morning and at bedtime.   levothyroxine  25 MCG tablet Commonly known as: SYNTHROID  Take 25 mcg by mouth daily.   losartan  50 MG tablet Commonly known as: COZAAR  Take 1 tablet (50 mg total) by mouth daily.   magnesium  oxide 400 (240 Mg) MG tablet Commonly known as: MAG-OX Take 400 mg by mouth daily.   meclizine  25 MG tablet Commonly known as: ANTIVERT  Take 1 tablet (25 mg total) by mouth every 6 (six) hours as needed.   metFORMIN  500 MG tablet Commonly known as: GLUCOPHAGE  Take 1 tablet by mouth 2 (two) times daily with a meal.   metoprolol  succinate 25 MG 24 hr tablet Commonly known as: TOPROL -XL Take 1 tablet (25 mg total) by mouth daily. Take with or immediately following a meal.   montelukast  10 MG tablet Commonly  known as: SINGULAIR  Take 10 mg by mouth at bedtime.   nitroGLYCERIN  0.4 MG SL tablet Commonly known as: NITROSTAT  Place 1 tablet (0.4 mg total) under the tongue every 5 (five) minutes as needed for chest pain.   omeprazole 40 MG capsule Commonly known as: PRILOSEC Take 40 mg by mouth daily.   oxyCODONE -acetaminophen  7.5-325 MG tablet Commonly known as: PERCOCET Take 1 tablet by mouth every 8 (eight) hours as needed for severe pain (pain score 7-10).  Take 1 tablet by mouth 3 (three) times daily as needed What changed:  when to take this reasons to take this   pioglitazone 30 MG tablet Commonly known as: ACTOS Take 30 mg by mouth daily.   polyethylene glycol 17 g packet Commonly known as: MIRALAX  / GLYCOLAX  Take 17 g by mouth 2 (two) times daily.   predniSONE  20 MG tablet Commonly known as: DELTASONE  Take 1 tablet (20 mg total) by mouth daily  with breakfast for 2 days. Start taking on: March 15, 2024   pregabalin  100 MG capsule Commonly known as: LYRICA  Take 100 mg by mouth 2 (two) times daily.   sertraline  50 MG tablet Commonly known as: ZOLOFT  Take 50 mg by mouth daily.   Trulicity 1.5 MG/0.5ML Soaj Generic drug: Dulaglutide Inject into the skin once a week.   VITAMIN B-12 IJ Inject as directed. Monthly   Xarelto  15 MG Tabs tablet Generic drug: Rivaroxaban  TAKE 1 TABLET (15 MG TOTAL) BY MOUTH DAILY.               Discharge Care Instructions  (From admission, onward)           Start     Ordered   03/14/24 0000  Discharge wound care:       Comments: Follow with IR as scheduled   03/14/24 9076            Follow-up Information     Valora Lynwood FALCON, MD Follow up in 1 week(s).   Specialty: Family Medicine Contact information: [address] [place] [address] [phone]                Discharge Exam: Fredricka Weights   03/09/24 0948 03/09/24 2057  Weight: 81.6 kg 81.6 kg   General exam: Appears calm and comfortable   Respiratory system: Clear to auscultation. Respiratory effort normal. Cardiovascular system: S1 & S2 heard, RRR. No JVD, murmurs, rubs, gallops or clicks. No pedal edema. Gastrointestinal system: Abdomen is nondistended, soft and nontender. No organomegaly or masses felt. Normal bowel sounds heard. Central nervous system: Alert and oriented. No focal neurological deficits. Extremities: Symmetric 5 x 5 power. Skin: No rashes, lesions or ulcers Psychiatry: Judgement and insight appear normal. Mood & affect appropriate.    Condition at discharge: good  The results of significant diagnostics from this hospitalization (including imaging, microbiology, ancillary and laboratory) are listed below for reference.   Imaging Studies: CT LUMBAR SPINE WO CONTRAST Result Date: 03/11/2024 EXAM: CT OF THE LUMBAR SPINE WITHOUT CONTRAST 03/11/2024 11:11:55 AM TECHNIQUE: CT of the lumbar spine was performed without the administration of intravenous contrast. Multiplanar reformatted images are provided for review. Automated exposure control, iterative reconstruction, and/or weight based adjustment of the mA/kV was utilized to reduce the radiation dose to as low as reasonably achievable. COMPARISON: CT Lumbar spine without IV contrast 01/13/2024. MRI 02/10/2024. CLINICAL HISTORY: Compression fracture, lumbar; s/p kyphoplasty with worsening pain. FINDINGS: BONES AND ALIGNMENT: Normal vertebral body heights except for L3, L4, and L5. L3: Chronic 35% superior endplate compression fracture. L4: 25% compression fracture with vertebral augmentation. Fracture plane still visible along the superior endplate and to a lesser extent along the inferior endplate. Vertebral sclerosis noted along with 3 mm posterior bony retropulsion. A small amount of the methacrylate extends through the inferior endplate and into the right L4-L5 intervertebral disc space region. L5: 25% superior endplate compression fracture with vertebral  augmentation. The fracture plane is still visible along the superior endplate. Vertebral sclerosis noted. The lowest lumbar type non-weightbearing vertebra is labeled as L5. No acute fracture or suspicious bone lesion. Normal alignment. Thoracolumbar spondylosis. DEGENERATIVE CHANGES: L1-L2: No impingement, disc osteophyte complex. L2-L3: Moderate central stenosis and moderate left foraminal stenosis due to disc osteophyte complex, ligamentum flavum redundancy, and facet arthropathy. No change from 01/13/2024. L3-L4: Moderate central stenosis and borderline bilateral foraminal stenosis due to intervertebral and facet spurring along with posterior bony retropulsion from the posterior superior endplate of  L4. Central stenosis mildly worsened from 01/13/2024. L4-L5: Moderate bilateral foraminal stenosis and borderline central stenosis due to disc osteophyte complex, facet arthropathy, and mild posterior bony retropulsion along the inferior endplate of L4. The left foraminal impingement is mildly worsened from 01/13/2024. L5-S1: Borderline bilateral foraminal stenosis due to disc osteophyte complex and facet arthropathy. Cannot exclude disc protrusion extending cephalad from the L5-S1 intervertebral disc in the midline. SOFT TISSUES: Systemic atherosclerosis is present, including the aorta and iliac arteries. Mild dependent atelectasis in both lower lobes. Coronary atherosclerosis noted. No acute abnormality. IMPRESSION: 1. 25% compression fracture at L4 with vertebral augmentation, vertebral sclerosis, and 3 mm posterior bony retropulsion. 2. 25% superior endplate compression fracture at L5 with vertebral augmentation and vertebral sclerosis. 3. Chronic 35% superior endplate compression fracture at L3. 4. Moderate central stenosis at L2-3 and L3-4, with L3-4 central stenosis mildly worsened from 01/13/2024. 5. Moderate bilateral foraminal stenosis and borderline central stenosis at L4-5, with left foraminal impingement  mildly worsened from 01/13/2024. 6. Borderline bilateral foraminal stenosis at L5-S1. Disc protrusion extending cephalad from the L5-S1 intervertebral disc in the midline cannot be excluded. Electronically signed by: Ryan Salvage MD 03/11/2024 04:50 PM EST RP Workstation: [id number]   DG HIP UNILAT WITH PELVIS 2-3 VIEWS RIGHT Result Date: 03/09/2024 EXAM: 2 OR MORE VIEW(S) XRAY OF THE PELVIS AND RIGHT HIP 03/09/2024 12:02:00 PM COMPARISON: 01/15/2024 CLINICAL HISTORY: Pain FINDINGS: BONES AND JOINTS: SI joints are symmetric. No acute fracture. Bilateral hips demonstrate normal alignment. Mild degenerative changes of the right hip. Degenerative changes of the pubic symphysis similar to prior enthesopathy of the iliac crests more pronounced on the right. LUMBAR SPINE: Degenerative changes of the lower lumbar spine. Prior L4 and L5 vertebral augmentation. SOFT TISSUES: Vascular calcifications. IMPRESSION: 1. No acute abnormality of the right hip. 2. Mild degenerative changes of the right hip. Electronically signed by: Donnice Mania MD 03/09/2024 12:51 PM EST RP Workstation: [id number]   IR KYPHO EA ADDL LEVEL THORACIC OR LUMBAR Result Date: 03/08/2024 CLINICAL DATA:  89 year old female with highly symptomatic osteoporotic compression fractures of the L4 and L5 vertebral bodies. She has failed conservative management in therefore presents for cement augmentation with balloon kyphoplasty. EXAM: FLUOROSCOPIC GUIDED KYPHOPLASTY OF THE L4 VERTEBRAL BODY FLUOROSCOPIC GUIDED KYPHOPLASTY OF THE L5 VERTEBRAL BODY COMPARISON:  MRI lumbar spine 02/10/2024 MEDICATIONS: As antibiotic prophylaxis, 1 g Ancef  was ordered pre-procedure and administered intravenously within 1 hour of incision. 1000 mg of acetaminophen  was administered intravenously following the procedure by the radiology nurse. ANESTHESIA/SEDATION: Moderate (conscious) sedation was employed during this procedure. A total of Versed  4 mg and Fentanyl  200 mcg  0.2 mg Narcan  and 0.2 mg romazicon  was administered intravenously by the Radiology nurse. Moderate Sedation Time: 45 minutes. The patient's level of consciousness and vital signs were monitored continuously by radiology nursing throughout the procedure under my direct supervision. FLUOROSCOPY TIME:  Radiation exposure index: 104.1 mGy, air kerma COMPLICATIONS: None immediate. PROCEDURE: The procedure, risks (including but not limited to bleeding, infection, organ damage), benefits, and alternatives were explained to the patient. Questions regarding the procedure were encouraged and answered. The patient understands and consents to the procedure. The patient was placed prone on the fluoroscopic table. The skin overlying the lumbar region was then prepped and draped in the usual sterile fashion. Maximal barrier sterile technique was utilized including caps, mask, sterile gowns, sterile gloves, sterile drape, hand hygiene and skin antiseptic. Intravenous Fentanyl  and Versed  were administered as conscious sedation during continuous cardiorespiratory monitoring by  the radiology RN. L4 The right pedicle at L4 was then infiltrated with 1% lidocaine  followed by the advancement of a Kyphon trocar needle through the left pedicle into the posterior one-third of the vertebral body. Subsequently, the osteo drill was advanced to the anterior third of the vertebral body. Imaging confirms crossing of the midline allowing for a unipedicular access. The osteo drill was retracted. Through the working cannula, a Kyphon inflatable bone tamp 15 x 2.5 was advanced and positioned with the distal marker approximately 5 mm from the anterior aspect of the cortex. Appropriate positioning was confirmed on the AP projection. At this time, the balloon was expanded using contrast via a Kyphon inflation syringe device via micro tubing. Inflations were continued until there was near apposition with the superior end plate. At this time,  methylmethacrylate mixture was reconstituted in the Kyphon bone mixing device system. This was then loaded into the delivery mechanism, attached to Kyphon cement delivery system. The balloon was deflated and removed followed by the instillation of methylmethacrylate mixture with excellent filling in the AP and lateral projections. No extravasation was noted in the disk spaces or posteriorly into the spinal canal. No epidural venous contamination was seen. L5 The left pedicle at L5 was then infiltrated with 1% lidocaine  followed by the advancement of a Kyphon trocar needle through the left pedicle into the posterior one-third of the vertebral body. Subsequently, the osteo drill was advanced to the anterior third of the vertebral body. Imaging demonstrates that the balloon stays in the left aspect of the vertebral body therefore we will require a bipedicular access at L5. The osteo drill was retracted. Through the working cannula, a Kyphon inflatable bone tamp 15 x 2.5 was advanced and positioned with the distal marker approximately 5 mm from the anterior aspect of the cortex. Appropriate positioning was confirmed on the AP projection. At this time, the balloon was expanded using contrast via a Kyphon inflation syringe device via micro tubing. In similar fashion, the right L5 pedicle was infiltrated with 1% lidocaine  followed by the advancement of a second Kyphon trocar needle through the right pedicle into the posterior third of the vertebral body. Subsequently, the osteo drill was coaxially advanced to the anterior right third. The osteo drill was exchanged for a Kyphon inflatable bone tamp 15 x 2.5, advanced to the 5 mm of the anterior aspect of the cortex. The balloon was then expanded using contrast as above. Inflations were continued until there was near apposition with the superior end plate. At this time, methylmethacrylate mixture was reconstituted in the Kyphon bone mixing device system. This was then loaded  into the delivery mechanism, attached to Kyphon bone fillers. The balloons were deflated and removed followed by the instillation of methylmethacrylate mixture with excellent filling in the AP and lateral projections. No extravasation was noted in the disk spaces or posteriorly into the spinal canal. No epidural venous contamination was seen. The working cannulae and the bone filler were then retrieved and removed. Hemostasis was achieved with manual compression. The patient tolerated the procedure well without immediate postprocedural complication. IMPRESSION: 1. Technically successful unipedicular L4 vertebral body augmentation using balloon kyphoplasty. 2. Technically successful bipedicular L5 vertebral body augmentation using balloon kyphoplasty. 3. Per CMS PQRS reporting requirements (PQRS Measure 24): Given the patient's age of greater than 50 and the fracture site (hip, distal radius, or spine), the patient should be tested for osteoporosis using DXA, and the appropriate treatment considered based on the DXA results. Electronically Signed  By: Wilkie Lent M.D.   On: 03/08/2024 12:06   IR KYPHO LUMBAR INC FX REDUCE BONE BX UNI/BIL CANNULATION INC/IMAGING Result Date: 03/08/2024 CLINICAL DATA:  89 year old female with highly symptomatic osteoporotic compression fractures of the L4 and L5 vertebral bodies. She has failed conservative management in therefore presents for cement augmentation with balloon kyphoplasty. EXAM: FLUOROSCOPIC GUIDED KYPHOPLASTY OF THE L4 VERTEBRAL BODY FLUOROSCOPIC GUIDED KYPHOPLASTY OF THE L5 VERTEBRAL BODY COMPARISON:  MRI lumbar spine 02/10/2024 MEDICATIONS: As antibiotic prophylaxis, 1 g Ancef  was ordered pre-procedure and administered intravenously within 1 hour of incision. 1000 mg of acetaminophen  was administered intravenously following the procedure by the radiology nurse. ANESTHESIA/SEDATION: Moderate (conscious) sedation was employed during this procedure. A total  of Versed  4 mg and Fentanyl  200 mcg 0.2 mg Narcan  and 0.2 mg romazicon  was administered intravenously by the Radiology nurse. Moderate Sedation Time: 45 minutes. The patient's level of consciousness and vital signs were monitored continuously by radiology nursing throughout the procedure under my direct supervision. FLUOROSCOPY TIME:  Radiation exposure index: 104.1 mGy, air kerma COMPLICATIONS: None immediate. PROCEDURE: The procedure, risks (including but not limited to bleeding, infection, organ damage), benefits, and alternatives were explained to the patient. Questions regarding the procedure were encouraged and answered. The patient understands and consents to the procedure. The patient was placed prone on the fluoroscopic table. The skin overlying the lumbar region was then prepped and draped in the usual sterile fashion. Maximal barrier sterile technique was utilized including caps, mask, sterile gowns, sterile gloves, sterile drape, hand hygiene and skin antiseptic. Intravenous Fentanyl  and Versed  were administered as conscious sedation during continuous cardiorespiratory monitoring by the radiology RN. L4 The right pedicle at L4 was then infiltrated with 1% lidocaine  followed by the advancement of a Kyphon trocar needle through the left pedicle into the posterior one-third of the vertebral body. Subsequently, the osteo drill was advanced to the anterior third of the vertebral body. Imaging confirms crossing of the midline allowing for a unipedicular access. The osteo drill was retracted. Through the working cannula, a Kyphon inflatable bone tamp 15 x 2.5 was advanced and positioned with the distal marker approximately 5 mm from the anterior aspect of the cortex. Appropriate positioning was confirmed on the AP projection. At this time, the balloon was expanded using contrast via a Kyphon inflation syringe device via micro tubing. Inflations were continued until there was near apposition with the superior  end plate. At this time, methylmethacrylate mixture was reconstituted in the Kyphon bone mixing device system. This was then loaded into the delivery mechanism, attached to Kyphon cement delivery system. The balloon was deflated and removed followed by the instillation of methylmethacrylate mixture with excellent filling in the AP and lateral projections. No extravasation was noted in the disk spaces or posteriorly into the spinal canal. No epidural venous contamination was seen. L5 The left pedicle at L5 was then infiltrated with 1% lidocaine  followed by the advancement of a Kyphon trocar needle through the left pedicle into the posterior one-third of the vertebral body. Subsequently, the osteo drill was advanced to the anterior third of the vertebral body. Imaging demonstrates that the balloon stays in the left aspect of the vertebral body therefore we will require a bipedicular access at L5. The osteo drill was retracted. Through the working cannula, a Kyphon inflatable bone tamp 15 x 2.5 was advanced and positioned with the distal marker approximately 5 mm from the anterior aspect of the cortex. Appropriate positioning was confirmed on the AP  projection. At this time, the balloon was expanded using contrast via a Kyphon inflation syringe device via micro tubing. In similar fashion, the right L5 pedicle was infiltrated with 1% lidocaine  followed by the advancement of a second Kyphon trocar needle through the right pedicle into the posterior third of the vertebral body. Subsequently, the osteo drill was coaxially advanced to the anterior right third. The osteo drill was exchanged for a Kyphon inflatable bone tamp 15 x 2.5, advanced to the 5 mm of the anterior aspect of the cortex. The balloon was then expanded using contrast as above. Inflations were continued until there was near apposition with the superior end plate. At this time, methylmethacrylate mixture was reconstituted in the Kyphon bone mixing device  system. This was then loaded into the delivery mechanism, attached to Kyphon bone fillers. The balloons were deflated and removed followed by the instillation of methylmethacrylate mixture with excellent filling in the AP and lateral projections. No extravasation was noted in the disk spaces or posteriorly into the spinal canal. No epidural venous contamination was seen. The working cannulae and the bone filler were then retrieved and removed. Hemostasis was achieved with manual compression. The patient tolerated the procedure well without immediate postprocedural complication. IMPRESSION: 1. Technically successful unipedicular L4 vertebral body augmentation using balloon kyphoplasty. 2. Technically successful bipedicular L5 vertebral body augmentation using balloon kyphoplasty. 3. Per CMS PQRS reporting requirements (PQRS Measure 24): Given the patient's age of greater than 50 and the fracture site (hip, distal radius, or spine), the patient should be tested for osteoporosis using DXA, and the appropriate treatment considered based on the DXA results. Electronically Signed   By: Wilkie Lent M.D.   On: 03/08/2024 12:06   IR Radiologist Eval & Mgmt Result Date: 02/18/2024 EXAM: NEW PATIENT OFFICE VISIT CHIEF COMPLAINT: SEE NOTE IN EPIC HISTORY OF PRESENT ILLNESS: SEE NOTE IN EPIC REVIEW OF SYSTEMS: SEE NOTE IN EPIC PHYSICAL EXAMINATION: SEE NOTE IN EPIC ASSESSMENT AND PLAN: SEE NOTE IN EPIC Electronically Signed   By: Wilkie Lent M.D.   On: 02/18/2024 13:48    Microbiology: Results for orders placed or performed during the hospital encounter of 12/20/21  Urine Culture     Status: None   Collection Time: 12/20/21  5:37 PM   Specimen: Urine, Suprapubic  Result Value Ref Range Status   Specimen Description   Final    URINE, SUPRAPUBIC Performed at [facility], [address] [place], [place] [address]    Special Requests   Final    NONE Performed at [facility], [address], [place], [place] [address]    Culture   Final    NO GROWTH Performed at [facility], [address] [place], [place] [address]    Report Status 12/22/2021 FINAL  Final    Labs: CBC: Recent Labs  Lab 03/09/24 1702 03/10/24 0344  WBC 7.4 6.4  NEUTROABS 5.7  --   HGB 9.5* 8.9*  HCT 29.5* 27.2*  MCV 96.7 95.8  PLT 181 182   Basic Metabolic Panel: Recent Labs  Lab 03/09/24 1702 03/10/24 0344  NA 137 134*  K 4.0 4.1  CL 99 99  CO2 28 25  GLUCOSE 136* 146*  BUN 13 15  CREATININE 1.02* 1.04*  CALCIUM  9.0 8.9   Liver Function Tests: No results for input(s): AST, ALT, ALKPHOS, BILITOT, PROT, ALBUMIN in the last 168 hours. CBG: Recent Labs  Lab 03/13/24 0717 03/13/24 1202 03/13/24 1624 03/13/24 2135  03/14/24 0820  GLUCAP 190* 244* 270* 161* 130*    Discharge time spent: 35 minutes.  Signed: Murvin Mana, MD [facility] 03/14/2024

## 2024-03-14 NOTE — Plan of Care (Signed)
  Problem: Nutritional: Goal: Maintenance of adequate nutrition will improve Outcome: Progressing   Problem: Skin Integrity: Goal: Risk for impaired skin integrity will decrease Outcome: Progressing   Problem: Activity: Goal: Risk for activity intolerance will decrease Outcome: Progressing   Problem: Clinical Measurements: Goal: Ability to maintain clinical measurements within normal limits will improve Outcome: Progressing

## 2024-03-14 NOTE — TOC Transition Note (Signed)
 Transition of Care [facility]) - Discharge Note   Patient Details  Name: Sarah Potts MRN: [id number] Date of Birth: [birth year]  Transition of Care [facility]) CM/SW Contact:  Alvaro Louder, LCSW Phone Number: 03/14/2024, 10:59 AM   Clinical Narrative:   LCSWA confirmed with MD that patient is stable for discharge. LCSWA notified the patient and they are in agreement with discharge. LCSWA reached out to [facility] admissions coordinator and started service for patient. The patient reported that she would have her son come pick him up at discharge.  TOC signing off    Final next level of care: Home w Home Health Services Barriers to Discharge: No Barriers Identified   Patient Goals and CMS Choice            Discharge Placement              Patient chooses bed at:  (Home) Patient to be transferred to facility by: Son Name of family member notified: Self Patient and family notified of of transfer: 03/14/24  Discharge Plan and Services Additional resources added to the After Visit Summary for   In-house Referral: Clinical Social Work                        HH Arranged: PT, OT, RN, Nurse's Aide HH Agency: [facility] Date [facility] Agency Contacted: 03/14/24   Representative spoke with at [facility] Agency: [place]   Social Drivers of Health (SDOH) Interventions SDOH Screenings   Food Insecurity: No Food Insecurity (03/09/2024)  Housing: Low Risk  (03/09/2024)  Transportation Needs: No Transportation Needs (03/09/2024)  Utilities: At Risk (03/09/2024)  Financial Resource Strain: Low Risk  (10/19/2023)   Received from [facility]  Social Connections: Socially Isolated (03/09/2024)  Tobacco Use: Low Risk  (03/09/2024)     Readmission Risk Interventions     No data to display

## 2024-03-14 NOTE — Progress Notes (Signed)
 Patient is being discharged to home. Alert and oriented x4. Able to ambulate with walker independent and with SBA. Meds to bed have been delivered. Ostomy has been emptied and rinsed out. Belongings have been packed in 2 bags. IV to left arm has been removed. Catheter and tip intact. Removal site covered with gauze and tape. Discharge instructions have been given to patient and son (legal guardian) with understanding. Staff to transport patient to lobby.

## 2024-03-14 NOTE — Progress Notes (Signed)
 Physical Therapy Treatment Patient Details Name: Sarah Potts MRN: [id number] DOB: June 08, 1932 Today's Date: 03/14/2024   History of Present Illness Pt is a 89 y.o. female being admitted for observation due to low back and hip pain. Pt had fall in early October, after which she was found to have compression fractures and underwent balloon kyphoplasty x 2 on 11/25, went home after sx and presents back d/t signifiant leg/hip/low back pain.  PMH of CHF, DM 2, hyperlipidemia, hypertension, atrial fibrillation    PT Comments  Pt OOB with head elevated but no rails needed.  Has adjustable bed at home.  Steady in sitting.  She stands to RW with cga x 1 and is able to walk into bathroom to void and manage her own care then walks an additional 57' with cga x 1.  She remains in chair for breakfast after session.  Discussed discharge plan.  Pt stated she feels better and more confident about discharge home.  Stated she has been to rehab in the past and I did not like it.  Stated she prefers to discharge home.  Encouraged her to talk with son about increased support at home especially initially upon discharge.  Stated she wishes to return home vs rehab at this time.  Discussed with team. Would benefit from continued therapies in the home and any additional supports available to her.   If plan is discharge home, recommend the following: A little help with walking and/or transfers;A little help with bathing/dressing/bathroom;Help with stairs or ramp for entrance;Assistance with cooking/housework;Assist for transportation   Can travel by private vehicle        Equipment Recommendations  None recommended by PT    Recommendations for Other Services       Precautions / Restrictions Precautions Precautions: Fall Recall of Precautions/Restrictions: Intact Restrictions Weight Bearing Restrictions Per Provider Order: No     Mobility  Bed Mobility Overal bed mobility: Needs Assistance Bed Mobility:  Supine to Sit   Sidelying to sit: HOB elevated, Contact guard assist       General bed mobility comments: simulated home bed.  inc time but able to do on her own Patient Response: Cooperative  Transfers Overall transfer level: Needs assistance Equipment used: Rolling walker (2 wheels) Transfers: Sit to/from Stand Sit to Stand: Contact guard assist                Ambulation/Gait Ambulation/Gait assistance: Contact guard assist Gait Distance (Feet): 80 Feet Assistive device: Rolling walker (2 wheels) Gait Pattern/deviations: Step-through pattern, Decreased stride length, Trunk flexed, Shuffle Gait velocity: decr     General Gait Details: general imbalances but able to self correct without ourside assist   Stairs             Wheelchair Mobility     Tilt Bed Tilt Bed Patient Response: Cooperative  Modified Rankin (Stroke Patients Only)       Balance Overall balance assessment: Mild deficits observed, not formally tested Sitting-balance support: Feet supported Sitting balance-Leahy Scale: Good     Standing balance support: Reliant on assistive device for balance, Bilateral upper extremity supported Standing balance-Leahy Scale: Fair                              Musician Communication: Impaired Factors Affecting Communication: Hearing impaired  Cognition Arousal: Alert Behavior During Therapy: WFL for tasks assessed/performed   PT - Cognitive impairments: No apparent impairments  Following commands: Intact      Cueing Cueing Techniques: Verbal cues  Exercises Other Exercises Other Exercises: to toilet to void.  manages her own self care    General Comments        Pertinent Vitals/Pain Pain Assessment Pain Assessment: Faces Faces Pain Scale: Hurts a little bit Pain Location: RLE Pain Descriptors / Indicators: Sore Pain Intervention(s): Monitored during session,  Repositioned    Home Living                          Prior Function            PT Goals (current goals can now be found in the care plan section) Progress towards PT goals: Progressing toward goals    Frequency    Min 2X/week      PT Plan      Co-evaluation              AM-PAC PT 6 Clicks Mobility   Outcome Measure  Help needed turning from your back to your side while in a flat bed without using bedrails?: None Help needed moving from lying on your back to sitting on the side of a flat bed without using bedrails?: None Help needed moving to and from a bed to a chair (including a wheelchair)?: None Help needed standing up from a chair using your arms (e.g., wheelchair or bedside chair)?: None Help needed to walk in hospital room?: A Little Help needed climbing 3-5 steps with a railing? : A Little 6 Click Score: 22    End of Session Equipment Utilized During Treatment: Gait belt Activity Tolerance: Patient tolerated treatment well;Patient limited by fatigue Patient left: in chair;with call bell/phone within reach;with chair alarm set Nurse Communication: Mobility status PT Visit Diagnosis: Pain;Other abnormalities of gait and mobility (R26.89);Difficulty in walking, not elsewhere classified (R26.2) Pain - Right/Left: Right Pain - part of body: Leg;Hip     Time: 9099-9086 PT Time Calculation (min) (ACUTE ONLY): 13 min  Charges:    $Gait Training: 8-22 mins PT General Charges $$ ACUTE PT VISIT: 1 Visit                   Lauraine Gills, PTA 03/14/24, 9:21 AM

## 2024-03-15 ENCOUNTER — Emergency Department
Admission: EM | Admit: 2024-03-15 | Discharge: 2024-03-15 | Disposition: A | Discharge status: Skilled Nursing Facility | Attending: Emergency Medicine | Admitting: Emergency Medicine

## 2024-03-15 ENCOUNTER — Emergency Department

## 2024-03-15 ENCOUNTER — Other Ambulatory Visit: Payer: Self-pay

## 2024-03-15 DIAGNOSIS — N3090 Cystitis, unspecified without hematuria: Secondary | ICD-10-CM | POA: Insufficient documentation

## 2024-03-15 DIAGNOSIS — E119 Type 2 diabetes mellitus without complications: Secondary | ICD-10-CM | POA: Insufficient documentation

## 2024-03-15 DIAGNOSIS — M79604 Pain in right leg: Secondary | ICD-10-CM | POA: Insufficient documentation

## 2024-03-15 DIAGNOSIS — Z7901 Long term (current) use of anticoagulants: Secondary | ICD-10-CM | POA: Diagnosis not present

## 2024-03-15 DIAGNOSIS — C210 Malignant neoplasm of anus, unspecified: Secondary | ICD-10-CM

## 2024-03-15 DIAGNOSIS — I509 Heart failure, unspecified: Secondary | ICD-10-CM | POA: Diagnosis not present

## 2024-03-15 DIAGNOSIS — I110 Hypertensive heart disease with heart failure: Secondary | ICD-10-CM | POA: Insufficient documentation

## 2024-03-15 DIAGNOSIS — R2681 Unsteadiness on feet: Secondary | ICD-10-CM

## 2024-03-15 DIAGNOSIS — R2689 Other abnormalities of gait and mobility: Secondary | ICD-10-CM | POA: Diagnosis not present

## 2024-03-15 LAB — CBC WITH DIFFERENTIAL/PLATELET
Abs Immature Granulocytes: 0.09 K/uL — ABNORMAL HIGH (ref 0.00–0.07)
Basophils Absolute: 0 K/uL (ref 0.0–0.1)
Basophils Relative: 0 %
Eosinophils Absolute: 0 K/uL (ref 0.0–0.5)
Eosinophils Relative: 0 %
HCT: 32.9 % — ABNORMAL LOW (ref 36.0–46.0)
Hemoglobin: 10.6 g/dL — ABNORMAL LOW (ref 12.0–15.0)
Immature Granulocytes: 1 %
Lymphocytes Relative: 10 %
Lymphs Abs: 0.8 K/uL (ref 0.7–4.0)
MCH: 31.1 pg (ref 26.0–34.0)
MCHC: 32.2 g/dL (ref 30.0–36.0)
MCV: 96.5 fL (ref 80.0–100.0)
Monocytes Absolute: 0.7 K/uL (ref 0.1–1.0)
Monocytes Relative: 9 %
Neutro Abs: 6 K/uL (ref 1.7–7.7)
Neutrophils Relative %: 80 %
Platelets: 373 K/uL (ref 150–400)
RBC: 3.41 MIL/uL — ABNORMAL LOW (ref 3.87–5.11)
RDW: 14.7 % (ref 11.5–15.5)
WBC: 7.7 K/uL (ref 4.0–10.5)
nRBC: 0 % (ref 0.0–0.2)

## 2024-03-15 LAB — BASIC METABOLIC PANEL WITH GFR
Anion gap: 14 (ref 5–15)
BUN: 29 mg/dL — ABNORMAL HIGH (ref 8–23)
CO2: 25 mmol/L (ref 22–32)
Calcium: 9.7 mg/dL (ref 8.9–10.3)
Chloride: 97 mmol/L — ABNORMAL LOW (ref 98–111)
Creatinine, Ser: 1.35 mg/dL — ABNORMAL HIGH (ref 0.44–1.00)
GFR, Estimated: 37 mL/min — ABNORMAL LOW (ref >60)
Glucose, Bld: 194 mg/dL — ABNORMAL HIGH (ref 70–99)
Potassium: 4.9 mmol/L (ref 3.5–5.1)
Sodium: 137 mmol/L (ref 135–145)

## 2024-03-15 LAB — URINALYSIS, ROUTINE W REFLEX MICROSCOPIC
Bilirubin Urine: NEGATIVE
Glucose, UA: NEGATIVE mg/dL
Hgb urine dipstick: NEGATIVE
Ketones, ur: NEGATIVE mg/dL
Leukocytes,Ua: NEGATIVE
Nitrite: POSITIVE — AB
Protein, ur: NEGATIVE mg/dL
RBC / HPF: 0 RBC/hpf (ref 0–5)
Specific Gravity, Urine: 1.009 (ref 1.005–1.030)
pH: 6 (ref 5.0–8.0)

## 2024-03-15 MED ORDER — LOSARTAN POTASSIUM 50 MG PO TABS
50.0000 mg | ORAL_TABLET | Freq: Every day | ORAL | Status: DC
  Administered 2024-03-15: 50 mg via ORAL
  Filled 2024-03-15: qty 1

## 2024-03-15 MED ORDER — AMIODARONE HCL 200 MG PO TABS
200.0000 mg | ORAL_TABLET | Freq: Every day | ORAL | Status: DC
  Administered 2024-03-15: 200 mg via ORAL
  Filled 2024-03-15: qty 1

## 2024-03-15 MED ORDER — PREGABALIN 50 MG PO CAPS
100.0000 mg | ORAL_CAPSULE | Freq: Two times a day (BID) | ORAL | Status: DC
  Administered 2024-03-15: 100 mg via ORAL
  Filled 2024-03-15: qty 2

## 2024-03-15 MED ORDER — LEVOTHYROXINE SODIUM 50 MCG PO TABS
25.0000 ug | ORAL_TABLET | Freq: Every day | ORAL | Status: DC
  Administered 2024-03-15: 25 ug via ORAL
  Filled 2024-03-15: qty 1

## 2024-03-15 MED ORDER — DORZOLAMIDE HCL-TIMOLOL MAL 2-0.5 % OP SOLN
1.0000 [drp] | Freq: Three times a day (TID) | OPHTHALMIC | Status: DC
  Administered 2024-03-15: 1 [drp] via OPHTHALMIC
  Filled 2024-03-15: qty 10

## 2024-03-15 MED ORDER — MECLIZINE HCL 25 MG PO TABS: 25.0000 mg | ORAL_TABLET | Freq: Four times a day (QID) | ORAL | Status: DC | PRN

## 2024-03-15 MED ORDER — SERTRALINE HCL 50 MG PO TABS
50.0000 mg | ORAL_TABLET | Freq: Every day | ORAL | Status: DC
  Administered 2024-03-15: 50 mg via ORAL
  Filled 2024-03-15: qty 1

## 2024-03-15 MED ORDER — OXYCODONE-ACETAMINOPHEN 5-325 MG PO TABS: 1.0000 | ORAL_TABLET | ORAL | 0 refills | Status: AC | PRN

## 2024-03-15 MED ORDER — PREDNISONE 20 MG PO TABS
20.0000 mg | ORAL_TABLET | Freq: Every day | ORAL | Status: DC
  Administered 2024-03-15: 20 mg via ORAL
  Filled 2024-03-15: qty 1

## 2024-03-15 MED ORDER — PANTOPRAZOLE SODIUM 40 MG PO TBEC
40.0000 mg | DELAYED_RELEASE_TABLET | Freq: Every day | ORAL | Status: DC
  Administered 2024-03-15: 40 mg via ORAL
  Filled 2024-03-15: qty 1

## 2024-03-15 MED ORDER — VITAMIN D 25 MCG (1000 UNIT) PO TABS
1000.0000 [IU] | ORAL_TABLET | Freq: Every day | ORAL | Status: DC
  Administered 2024-03-15: 1000 [IU] via ORAL
  Filled 2024-03-15: qty 1

## 2024-03-15 MED ORDER — NITROGLYCERIN 0.4 MG SL SUBL: 0.4000 mg | SUBLINGUAL_TABLET | SUBLINGUAL | Status: DC | PRN

## 2024-03-15 MED ORDER — ATORVASTATIN CALCIUM 20 MG PO TABS
40.0000 mg | ORAL_TABLET | Freq: Every day | ORAL | Status: DC
  Administered 2024-03-15: 40 mg via ORAL
  Filled 2024-03-15: qty 2

## 2024-03-15 MED ORDER — MONTELUKAST SODIUM 10 MG PO TABS: 10.0000 mg | ORAL_TABLET | Freq: Every day | ORAL | Status: DC

## 2024-03-15 MED ORDER — ATROPINE SULFATE 1 % OP SOLN
1.0000 [drp] | Freq: Every day | OPHTHALMIC | Status: DC
  Administered 2024-03-15: 1 [drp] via OPHTHALMIC
  Filled 2024-03-15: qty 2

## 2024-03-15 MED ORDER — POLYETHYLENE GLYCOL 3350 17 G PO PACK
17.0000 g | PACK | Freq: Two times a day (BID) | ORAL | Status: DC
  Administered 2024-03-15: 17 g via ORAL
  Filled 2024-03-15: qty 1

## 2024-03-15 MED ORDER — METFORMIN HCL 500 MG PO TABS
500.0000 mg | ORAL_TABLET | Freq: Two times a day (BID) | ORAL | Status: DC
  Administered 2024-03-15 (×2): 500 mg via ORAL
  Filled 2024-03-15 (×2): qty 1

## 2024-03-15 MED ORDER — AMLODIPINE BESYLATE 5 MG PO TABS
5.0000 mg | ORAL_TABLET | Freq: Once | ORAL | Status: AC
  Administered 2024-03-15: 5 mg via ORAL
  Filled 2024-03-15: qty 1

## 2024-03-15 MED ORDER — METOPROLOL SUCCINATE ER 25 MG PO TB24
25.0000 mg | ORAL_TABLET | Freq: Every day | ORAL | Status: DC
  Administered 2024-03-15: 25 mg via ORAL
  Filled 2024-03-15: qty 1

## 2024-03-15 MED ORDER — OXYCODONE-ACETAMINOPHEN 7.5-325 MG PO TABS
1.0000 | ORAL_TABLET | Freq: Three times a day (TID) | ORAL | Status: DC | PRN
  Administered 2024-03-15: 1 via ORAL
  Filled 2024-03-15: qty 1

## 2024-03-15 MED ORDER — ACETAMINOPHEN 325 MG PO TABS: 650.0000 mg | ORAL_TABLET | Freq: Four times a day (QID) | ORAL | Status: DC | PRN

## 2024-03-15 MED ORDER — AMLODIPINE BESYLATE 5 MG PO TABS
2.5000 mg | ORAL_TABLET | Freq: Every day | ORAL | Status: DC
  Administered 2024-03-15: 2.5 mg via ORAL
  Filled 2024-03-15: qty 1

## 2024-03-15 MED ORDER — RIVAROXABAN 15 MG PO TABS
15.0000 mg | ORAL_TABLET | Freq: Every day | ORAL | Status: DC
  Administered 2024-03-15: 15 mg via ORAL
  Filled 2024-03-15: qty 1

## 2024-03-15 MED ORDER — BISACODYL 5 MG PO TBEC: 5.0000 mg | DELAYED_RELEASE_TABLET | Freq: Every day | ORAL | Status: DC | PRN

## 2024-03-15 MED ORDER — ALBUTEROL SULFATE (2.5 MG/3ML) 0.083% IN NEBU: 2.5000 mg | INHALATION_SOLUTION | RESPIRATORY_TRACT | Status: DC | PRN

## 2024-03-15 MED ORDER — CYCLOBENZAPRINE HCL 10 MG PO TABS
5.0000 mg | ORAL_TABLET | Freq: Three times a day (TID) | ORAL | Status: DC
  Administered 2024-03-15 (×2): 5 mg via ORAL
  Filled 2024-03-15 (×2): qty 1

## 2024-03-15 MED ORDER — MAGNESIUM OXIDE -MG SUPPLEMENT 400 (240 MG) MG PO TABS
400.0000 mg | ORAL_TABLET | Freq: Every day | ORAL | Status: DC
  Administered 2024-03-15: 400 mg via ORAL
  Filled 2024-03-15: qty 1

## 2024-03-15 MED ORDER — PIOGLITAZONE HCL 30 MG PO TABS
30.0000 mg | ORAL_TABLET | Freq: Every day | ORAL | Status: DC
  Administered 2024-03-15: 30 mg via ORAL
  Filled 2024-03-15: qty 1

## 2024-03-15 MED ORDER — FOSFOMYCIN TROMETHAMINE 3 G PO PACK
3.0000 g | PACK | Freq: Once | ORAL | Status: AC
  Administered 2024-03-15: 3 g via ORAL
  Filled 2024-03-15: qty 3

## 2024-03-15 NOTE — Evaluation (Signed)
 Physical Therapy Evaluation Patient Details Name: Sarah Potts MRN: [id number] DOB: 1932-04-18 Today's Date: 03/15/2024  History of Present Illness  Patient is a 89 year old female with 2 falls since recent discharge home from hospital. Just admitted for back and hip pain after recent compression fracture status post kyphoplasty with no further surgery recommended.  Clinical Impression  Patient is agreeable to PT evaluation. She reports she was home only briefly but fell twice. She reports she will be unable to manage at home and wants rehab now. She is ambulatory with a walker recently.  Today the patient complains of pain in the right leg, groin, and buttocks. She is requiring assistance with bed mobility, transfers, and short distance ambulation with rolling walker. Activity tolerance limited by fatigue and pain. The patient would be unsafe to return home alone at this time. Recommend rehabilitation < 3 hours/day after this hospital stay. PT will continue to follow.     If plan is discharge home, recommend the following: A lot of help with walking and/or transfers;A lot of help with bathing/dressing/bathroom;Assistance with cooking/housework;Help with stairs or ramp for entrance;Assist for transportation   Can travel by private vehicle   No    Equipment Recommendations None recommended by PT  Recommendations for Other Services       Functional Status Assessment Patient has had a recent decline in their functional status and demonstrates the ability to make significant improvements in function in a reasonable and predictable amount of time.     Precautions / Restrictions Precautions Precautions: Fall Recall of Precautions/Restrictions: Intact Restrictions Weight Bearing Restrictions Per Provider Order: No      Mobility  Bed Mobility Overal bed mobility: Needs Assistance Bed Mobility: Supine to Sit, Sit to Supine     Supine to sit: Mod assist Sit to supine: Mod assist, +2  for physical assistance   General bed mobility comments: assistance for trunk and BLE support. encourage logroll technique for comfort    Transfers Overall transfer level: Needs assistance Equipment used: Rolling walker (2 wheels) Transfers: Sit to/from Stand, Bed to chair/wheelchair/BSC Sit to Stand: Mod assist, Min assist   Step pivot transfers: Mod assist       General transfer comment: Min A for standing from high stretcher height. Mod A for standing from lower toilet surface. cues for hand placement for safety. patient walked from stretcher to toilet but fatigued after and unable to ambulate the same distance back to stretcher. brought stretcher over to the toilet and performed pivot transfer with Mod A    Ambulation/Gait Ambulation/Gait assistance: Min assist (intermittent +2 person assistance) Gait Distance (Feet): 20 Feet Assistive device: Rolling walker (2 wheels) Gait Pattern/deviations: Step-to pattern, Antalgic, Trunk flexed Gait velocity: decreased     General Gait Details: cues to stand closer to rolling walker for support. steadying assistance provided  Stairs            Wheelchair Mobility     Tilt Bed    Modified Rankin (Stroke Patients Only)       Balance Overall balance assessment: History of Falls Sitting-balance support: Feet supported Sitting balance-Leahy Scale: Fair     Standing balance support: Reliant on assistive device for balance Standing balance-Leahy Scale: Poor Standing balance comment: external support required to maintain standing balance                             Pertinent Vitals/Pain Pain Assessment Pain Assessment: 0-10 Pain  Score: 8  Pain Location: right leg, groin, buttocks Pain Descriptors / Indicators: Sore Pain Intervention(s): Limited activity within patient's tolerance, Monitored during session, Repositioned    Home Living Family/patient expects to be discharged to:: Private residence Living  Arrangements: Alone Available Help at Discharge: Family Type of Home: House Home Access: Ramped entrance       Home Layout: One level Home Equipment: Agricultural Consultant (2 wheels);Rollator (4 wheels) Additional Comments: adjustable bed, was set-up with home health PT    Prior Function Prior Level of Function : Needs assist             Mobility Comments: 2 falls since discharge home. was ambulating in the hospital with rolling walker and CGA, limited distances ADLs Comments: IND in ADL/IADL. Son assists her with grocery shopping. She manages a colostomy bag independently and must stand while doing this.     Extremity/Trunk Assessment   Upper Extremity Assessment Upper Extremity Assessment: Overall WFL for tasks assessed    Lower Extremity Assessment Lower Extremity Assessment: Generalized weakness (pain in RLE)    Cervical / Trunk Assessment Cervical / Trunk Assessment: Back Surgery (recent kyphoplasty)  Communication   Communication Communication: Impaired Factors Affecting Communication: Hearing impaired    Cognition Arousal: Alert Behavior During Therapy: WFL for tasks assessed/performed   PT - Cognitive impairments: No apparent impairments                         Following commands: Intact       Cueing Cueing Techniques: Verbal cues     General Comments      Exercises     Assessment/Plan    PT Assessment Patient needs continued PT services  PT Problem List Decreased strength;Decreased range of motion;Decreased activity tolerance;Decreased mobility;Decreased balance;Decreased cognition;Decreased safety awareness;Pain       PT Treatment Interventions DME instruction;Gait training;Stair training;Therapeutic activities;Functional mobility training;Therapeutic exercise;Balance training;Patient/family education;Cognitive remediation;Neuromuscular re-education    PT Goals (Current goals can be found in the Care Plan section)  Acute Rehab PT  Goals Patient Stated Goal: to go to rehab PT Goal Formulation: With patient Time For Goal Achievement: 03/29/24 Potential to Achieve Goals: Fair    Frequency Min 2X/week     Co-evaluation               AM-PAC PT 6 Clicks Mobility  Outcome Measure Help needed turning from your back to your side while in a flat bed without using bedrails?: A Lot Help needed moving from lying on your back to sitting on the side of a flat bed without using bedrails?: A Lot Help needed moving to and from a bed to a chair (including a wheelchair)?: A Lot Help needed standing up from a chair using your arms (e.g., wheelchair or bedside chair)?: A Lot Help needed to walk in hospital room?: A Lot Help needed climbing 3-5 steps with a railing? : Total 6 Click Score: 11    End of Session   Activity Tolerance: Patient limited by fatigue Patient left: in bed;with call bell/phone within reach   PT Visit Diagnosis: Muscle weakness (generalized) (M62.81);Unsteadiness on feet (R26.81)    Time: 9156-9095 PT Time Calculation (min) (ACUTE ONLY): 21 min   Charges:   PT Evaluation $PT Eval Low Complexity: 1 Low PT Treatments $Gait Training: 8-22 mins PT General Charges $$ ACUTE PT VISIT: 1 Visit         Randine Essex, PT, MPT  Randine LULLA Essex 03/15/2024, 10:01 AM

## 2024-03-15 NOTE — ED Notes (Signed)
 Message sent to pharmacy regarding the missing dose of atropine eye drops

## 2024-03-15 NOTE — ED Provider Notes (Signed)
 [facility] Provider Note    Event Date/Time   First MD Initiated Contact with Patient 03/15/24 0143     (approximate)   History   Fall   HPI  Sarah Potts is a 89 y.o. female with history of CHF, type 2 diabetes, hypertension, hyperlipidemia, A-fib on Xarelto  who presents with 2 falls over the last day since she was discharged from the hospital.  Patient states that both times he fell like her legs gave out.  Particularly her right leg has been somewhat weak, and she has difficulty pivoting.  This is not new.  She has had increased pain and difficulty with walking since her recent lumbar fracture and kyphoplasty.  She was just hospitalized.  She states that she was offered rehab upon discharge, but thought she could go home.  She states that now she feels like she will need to go to a SNF for rehab and is not able to be at home safely.  She denies any hitting her head or having any other injuries from the falls.  She has no acute back pain.  She denies any new numbness or weakness in the legs.  She has no incontinence.  I reviewed the past medical records.  The patient was just admitted to the hospitalist service for back and hip pain after recent compression fracture status post kyphoplasty.  Neurosurgery evaluated the patient while in the hospital and did not recommend further surgery.   Physical Exam   Triage Vital Signs: ED Triage Vitals  Encounter Vitals Group     BP 03/15/24 0147 (!) 167/57     Girls Systolic BP Percentile --      Girls Diastolic BP Percentile --      Boys Systolic BP Percentile --      Boys Diastolic BP Percentile --      Pulse Rate 03/15/24 0147 68     Resp 03/15/24 0147 18     Temp 03/15/24 0147 98.2 F (36.8 C)     Temp Source 03/15/24 0147 Oral     SpO2 03/15/24 0147 100 %     Weight 03/15/24 0145 180 lb 12.4 oz (82 kg)     Height 03/15/24 0145 5' 2 (1.575 m)     Head Circumference --      Peak Flow --      Pain Score  03/15/24 0145 5     Pain Loc --      Pain Education --      Exclude from Growth Chart --     Most recent vital signs: Vitals:   03/15/24 0400 03/15/24 0530  BP: 115/69 (!) 119/98  Pulse: 66 66  Resp:    Temp:    SpO2:       General: Awake, no distress.  CV:  Good peripheral perfusion.  Resp:  Normal effort.  Abd:  No distention.  Other:  5/5 motor strength and intact sensation of bilateral lower extremities.   ED Results / Procedures / Treatments   Labs (all labs ordered are listed, but only abnormal results are displayed) Labs Reviewed  BASIC METABOLIC PANEL WITH GFR - Abnormal; Notable for the following components:      Result Value   Chloride 97 (*)    Glucose, Bld 194 (*)    BUN 29 (*)    Creatinine, Ser 1.35 (*)    GFR, Estimated 37 (*)    All other components within normal limits  CBC WITH DIFFERENTIAL/PLATELET -  Abnormal; Notable for the following components:   RBC 3.41 (*)    Hemoglobin 10.6 (*)    HCT 32.9 (*)    Abs Immature Granulocytes 0.09 (*)    All other components within normal limits  URINALYSIS, ROUTINE W REFLEX MICROSCOPIC - Abnormal; Notable for the following components:   Color, Urine YELLOW (*)    APPearance CLEAR (*)    Nitrite POSITIVE (*)    Bacteria, UA RARE (*)    All other components within normal limits     EKG     RADIOLOGY  CT lumbar spine:   IMPRESSION:  1. No new fracture.  2. Unchanged compression fractures at L3, L4, and L5, with L4 and L5 status  post augmentation.  3. Multilevel lower lumbar spinal canal and neural foraminal stenosis,  unchanged.    PROCEDURES:  Critical Care performed: No  Procedures   MEDICATIONS ORDERED IN ED: Medications  acetaminophen  (TYLENOL ) tablet 650 mg (has no administration in time range)  albuterol  (PROVENTIL ) (2.5 MG/3ML) 0.083% nebulizer solution 2.5 mg (has no administration in time range)  amiodarone  (PACERONE ) tablet 200 mg (has no administration in time range)   amLODipine  (NORVASC ) tablet 2.5 mg (has no administration in time range)  atorvastatin  (LIPITOR) tablet 40 mg (has no administration in time range)  atropine  1 % ophthalmic solution 1 drop (has no administration in time range)  bisacodyl  (DULCOLAX) EC tablet 5 mg (has no administration in time range)  cholecalciferol  (VITAMIN D3) 25 MCG (1000 UNIT) tablet 1,000 Units (has no administration in time range)  cyclobenzaprine  (FLEXERIL ) tablet 5 mg (has no administration in time range)  dorzolamide -timolol  (COSOPT ) 2-0.5 % ophthalmic solution 1 drop (has no administration in time range)  levothyroxine  (SYNTHROID ) tablet 25 mcg (has no administration in time range)  losartan  (COZAAR ) tablet 50 mg (has no administration in time range)  magnesium  oxide (MAG-OX) tablet 400 mg (has no administration in time range)  meclizine  (ANTIVERT ) tablet 25 mg (has no administration in time range)  metFORMIN  (GLUCOPHAGE ) tablet 500 mg (has no administration in time range)  metoprolol  succinate (TOPROL -XL) 24 hr tablet 25 mg (has no administration in time range)  montelukast  (SINGULAIR ) tablet 10 mg (has no administration in time range)  nitroGLYCERIN  (NITROSTAT ) SL tablet 0.4 mg (has no administration in time range)  pantoprazole  (PROTONIX ) EC tablet 40 mg (has no administration in time range)  oxyCODONE -acetaminophen  (PERCOCET) 7.5-325 MG per tablet 1 tablet (has no administration in time range)  pioglitazone  (ACTOS ) tablet 30 mg (has no administration in time range)  polyethylene glycol (MIRALAX  / GLYCOLAX ) packet 17 g (has no administration in time range)  predniSONE  (DELTASONE ) tablet 20 mg (has no administration in time range)  pregabalin  (LYRICA ) capsule 100 mg (has no administration in time range)  sertraline  (ZOLOFT ) tablet 50 mg (has no administration in time range)  Rivaroxaban  (XARELTO ) tablet 15 mg (has no administration in time range)  fosfomycin (MONUROL ) packet 3 g (3 g Oral Given 03/15/24 0522)      IMPRESSION / MDM / ASSESSMENT AND PLAN / ED COURSE  I reviewed the triage vital signs and the nursing notes.  89 year old female with PMH as noted above presents after she was just discharged from the hospital with 2 falls over the last day, feeling like her legs are giving out on her.  The patient has had leg pain and some weakness since a recent lumbar spine fracture and kyphoplasty.  On exam she is overall well-appearing.  She has no acute neurologic  deficits in the lower extremities.  Differential diagnosis includes, but is not limited to, lumbar radiculopathy, compression fracture, exacerbation of chronic pain, peripheral neuropathy, electrolyte abnormality, UTI.  Given the 2 falls since she left the hospital, we will obtain CT of the lumbar spine to determine if there are any new compression fractures or other acute injuries.  I have also ordered basic labs and a urinalysis.  If this workup is negative I anticipate that the patient will transition to [facility] boarder status, pending TOC consultation for SNF placement.  Patient's presentation is most consistent with acute complicated illness / injury requiring diagnostic workup.  ----------------------------------------- 6:47 AM on 03/15/2024 -----------------------------------------  BMP significant for mild elevation in creatinine, although this is similar to the patient's labs were a month ago.  CBC shows no acute findings.  Urinalysis demonstrates 6-10 WBCs and is nitrite positive, consistent with a mild UTI.  I have ordered a dose of fosfomycin.  CT of the lumbar spine shows no acute fractures or changes when compared to the patient's last imaging from 11/28.  At this time, there is no indication for inpatient admission.  I discussed with the patient and her son the options for disposition.  The patient is interested in rehab placement.  I have ordered all of her home medications, PT evaluation, and TOC consultation to help determine  disposition.  The patient will be signed out to the oncoming ED provider.   FINAL CLINICAL IMPRESSION(S) / ED DIAGNOSES   Final diagnoses:  Right leg pain  Gait instability  Cystitis     Rx / DC Orders   ED Discharge Orders     None        Note:  This document was prepared using Dragon voice recognition software and may include unintentional dictation errors.    Jacolyn Pae, MD 03/15/24 [phone]

## 2024-03-15 NOTE — ED Triage Notes (Signed)
 BIB by [facility] for a fall. States she went to the bathroom and legs gave out Complaining on bilateral knee pain, denies LOC.

## 2024-03-15 NOTE — ED Notes (Addendum)
 This NT assisted pt to the restroom using a walker. After using the restroom this NT assisted pt walking back to bed. Pt was almost back to bed, when she expressed feeling weak. This NT helped pt onto bed, pt then started to slide off of bed. This NT gently guided pt on to the floor. This NT was assisted by Cherene RN and Olam RN to get pt back in to bed. MD and Leotis RN were notified.

## 2024-03-15 NOTE — TOC Transition Note (Signed)
 Transition of Care [facility]) - Discharge Note   Patient Details  Name: Sarah Potts MRN: [id number] Date of Birth: [birth year]  [facility]) CM/SW Contact:  [name] Ring, RN Phone Number: 03/15/2024, 3:48 PM   Clinical Narrative:    Insurance authorization approved Auth # [id number] Dates: 12/2-12/11/2023 Next Review Date: 03/21/2024.  [facility] accept today.  Patient will be going to room 325.  ED nurse will call report to [phone].  ED secretary to set up [employer].  Patient notified and can let her son know.    Final next level of care: Skilled Nursing Facility Barriers to Discharge: Barriers Resolved   Patient Goals and CMS Choice Patient states their goals for this hospitalization and ongoing recovery are:: Agrees to go to rehab CMS Medicare.gov Compare Post Acute Care list provided to:: Patient Choice offered to / list presented to : Patient      Discharge Placement   Existing PASRR number confirmed : 03/15/24          Patient chooses bed at: [facility] Patient to be transferred to facility by: [facility] Name of family member notified: Self Patient and family notified of of transfer: 03/15/24  Discharge Plan and Services Additional resources added to the After Visit Summary for     Discharge Planning Services: CM Consult Post Acute Care Choice: Skilled Nursing Facility          DME Arranged: N/A         HH Arranged: NA          Social Drivers of Health (SDOH) Interventions SDOH Screenings   Food Insecurity: No Food Insecurity (03/09/2024)  Housing: Low Risk  (03/09/2024)  Transportation Needs: No Transportation Needs (03/09/2024)  Utilities: At Risk (03/09/2024)  Financial Resource Strain: Low Risk  (10/19/2023)   Received from [facility]  Social Connections: Socially Isolated (03/09/2024)  Tobacco Use: Low Risk  (03/09/2024)     Readmission Risk Interventions     No data to  display

## 2024-03-15 NOTE — ED Notes (Signed)
 [facility] called spoke with Will @3 :32 regarding transfer to [employer]

## 2024-03-15 NOTE — NC FL2 (Signed)
 [place]  MEDICAID FL2 LEVEL OF CARE FORM     IDENTIFICATION  Patient Name: Sarah Potts Birthdate: [birth year] Sex: female Admission Date (Current Location): 03/15/2024  [place] and [place] Number:  Chiropodist and Address:  [facility], [address] [place], [place] [address]      Provider Number: [id number]  Attending Physician Name and Address:  Claudene Rover, MD  Relative Name and Phone Number:  Beverley Miu- Friend [phone]    Current Level of Care: Other (Comment) (Emergency room border for rehab) Recommended Level of Care: Skilled Nursing Facility Prior Approval Number:    Date Approved/Denied:   PASRR Number: [id number]  Discharge Plan: SNF    Current Diagnoses: Patient Active Problem List   Diagnosis Date Noted   Status post kyphoplasty 03/12/2024   Lumbar radiculopathy, right 03/12/2024   Intractable back pain 03/12/2024   Class 1 obesity 03/10/2024   Chronic right hip pain 03/09/2024   Acute on chronic low back pain 01/18/2024   Sciatica associated with disorder of lumbar spine 01/15/2024   Back pain 01/15/2024   Dizziness 06/29/2023   HFrEF (heart failure with reduced ejection fraction) (HCC) 06/29/2023   Closed right tibial fracture 03/13/2023   Fall 03/06/2023   H/O adenomatous polyp of colon 09/29/2022   Stress-induced cardiomyopathy    Ischemic cardiomyopathy    NSTEMI (non-ST elevated myocardial infarction) (HCC) 12/20/2021   Atrial fibrillation, chronic (HCC) 12/20/2021   Type II diabetes mellitus with renal manifestations (HCC) 12/20/2021   Obesity with body mass index (BMI) of 30.0 to 39.9 12/20/2021   Chronic diastolic CHF (congestive heart failure) (HCC) 12/20/2021   UTI (urinary tract infection) 12/20/2021   Asthma 12/01/2021   Atrial fibrillation (HCC) 12/01/2021   Colostomy in place (HCC) 03/16/2021   Absolute anemia 01/09/2021   Lung nodule 01/09/2021   Anemia in stage 3a chronic kidney  disease (HCC) 01/09/2021   B12 deficiency 01/09/2021   Stage 3a chronic kidney disease (HCC) 01/09/2021   Goals of care, counseling/discussion 12/18/2020   History of anal cancer 12/11/2020   Diarrhea 01/02/2014   Iron  deficiency anemia 01/02/2014   Bronchitis 12/15/2012   CAD (coronary artery disease) 11/13/2010   HTN (hypertension) 11/13/2010   Diabetes mellitus (HCC) 11/13/2010   Hyperlipidemia 11/13/2010    Orientation RESPIRATION BLADDER Height & Weight     Self, Time, Situation, Place  Normal Continent Weight: 91 kg Height:  5' 2 (157.5 cm)  BEHAVIORAL SYMPTOMS/MOOD NEUROLOGICAL BOWEL NUTRITION STATUS      Colostomy Diet (Regular)  AMBULATORY STATUS COMMUNICATION OF NEEDS Skin   Extensive Assist Verbally Normal                       Personal Care Assistance Level of Assistance  Bathing, Feeding, Dressing Bathing Assistance: Limited assistance Feeding assistance: Limited assistance Dressing Assistance: Limited assistance     Functional Limitations Info  Sight, Hearing, Speech Sight Info: Adequate Hearing Info: Impaired Speech Info: Adequate    SPECIAL CARE FACTORS FREQUENCY  PT (By licensed PT), OT (By licensed OT)     PT Frequency: 5 times per week OT Frequency: 5 times per week            Contractures Contractures Info: Not present    Additional Factors Info  Code Status, Allergies Code Status Info: Full code Allergies Info: Levofloxacin Not Specified  Nausea Only Patient reports that she thinks that she could take this medication. Patient reports that she thinks  that she could take this medication.    Patient reports that she thinks that she could take this medication.  Niacin Not Specified  Other (See Comments) Hot, Flushed Pt states she get real red and hot.    Hot, Flushed  Niacin And Related Not Specified  Other (See Comments) Pt states she get real red and hot.           Current Medications (03/15/2024):  This is the current hospital  active medication list Current Facility-Administered Medications  Medication Dose Route Frequency Provider Last Rate Last Admin   acetaminophen  (TYLENOL ) tablet 650 mg  650 mg Oral Q6H PRN Jacolyn Pae, MD       albuterol  (PROVENTIL ) (2.5 MG/3ML) 0.083% nebulizer solution 2.5 mg  2.5 mg Inhalation Q4H PRN Siadecki, Sebastian, MD       amiodarone  (PACERONE ) tablet 200 mg  200 mg Oral Daily Siadecki, Sebastian, MD   200 mg at 03/15/24 9057   amLODipine  (NORVASC ) tablet 2.5 mg  2.5 mg Oral Daily Siadecki, Sebastian, MD   2.5 mg at 03/15/24 9057   atorvastatin  (LIPITOR) tablet 40 mg  40 mg Oral Daily Siadecki, Sebastian, MD   40 mg at 03/15/24 9056   atropine  1 % ophthalmic solution 1 drop  1 drop Left Eye Q1200 Jacolyn Pae, MD       bisacodyl  (DULCOLAX) EC tablet 5 mg  5 mg Oral Daily PRN Jacolyn Pae, MD       cholecalciferol  (VITAMIN D3) 25 MCG (1000 UNIT) tablet 1,000 Units  1,000 Units Oral Daily Jacolyn Pae, MD   1,000 Units at 03/15/24 0944   cyclobenzaprine  (FLEXERIL ) tablet 5 mg  5 mg Oral TID Siadecki, Sebastian, MD   5 mg at 03/15/24 9056   dorzolamide -timolol  (COSOPT ) 2-0.5 % ophthalmic solution 1 drop  1 drop Left Eye TID Jacolyn Pae, MD       levothyroxine  (SYNTHROID ) tablet 25 mcg  25 mcg Oral Daily Siadecki, Sebastian, MD   25 mcg at 03/15/24 0945   losartan  (COZAAR ) tablet 50 mg  50 mg Oral Daily Siadecki, Sebastian, MD   50 mg at 03/15/24 0944   magnesium  oxide (MAG-OX) tablet 400 mg  400 mg Oral Daily Siadecki, Sebastian, MD   400 mg at 03/15/24 9055   meclizine  (ANTIVERT ) tablet 25 mg  25 mg Oral Q6H PRN Jacolyn Pae, MD       metFORMIN  (GLUCOPHAGE ) tablet 500 mg  500 mg Oral BID WC Siadecki, Sebastian, MD   500 mg at 03/15/24 9278   metoprolol  succinate (TOPROL -XL) 24 hr tablet 25 mg  25 mg Oral Daily Siadecki, Sebastian, MD   25 mg at 03/15/24 0944   montelukast  (SINGULAIR ) tablet 10 mg  10 mg Oral QHS Jacolyn Pae, MD        nitroGLYCERIN  (NITROSTAT ) SL tablet 0.4 mg  0.4 mg Sublingual Q5 min PRN Jacolyn Pae, MD       oxyCODONE -acetaminophen  (PERCOCET) 7.5-325 MG per tablet 1 tablet  1 tablet Oral Q8H PRN Jacolyn Pae, MD   1 tablet at 03/15/24 1006   pantoprazole  (PROTONIX ) EC tablet 40 mg  40 mg Oral Daily Siadecki, Sebastian, MD   40 mg at 03/15/24 9056   pioglitazone  (ACTOS ) tablet 30 mg  30 mg Oral Daily Siadecki, Sebastian, MD   30 mg at 03/15/24 9057   polyethylene glycol (MIRALAX  / GLYCOLAX ) packet 17 g  17 g Oral BID Jacolyn Pae, MD   17 g at 03/15/24 0941   predniSONE  (DELTASONE ) tablet  20 mg  20 mg Oral Q breakfast Jacolyn Pae, MD   20 mg at 03/15/24 9278   pregabalin  (LYRICA ) capsule 100 mg  100 mg Oral BID Jacolyn Pae, MD   100 mg at 03/15/24 0945   Rivaroxaban  (XARELTO ) tablet 15 mg  15 mg Oral Daily Siadecki, Sebastian, MD   15 mg at 03/15/24 9056   sertraline  (ZOLOFT ) tablet 50 mg  50 mg Oral Daily Siadecki, Sebastian, MD   50 mg at 03/15/24 0945   Current Outpatient Medications  Medication Sig Dispense Refill   acetaminophen  (ACETAMINOPHEN  8 HOUR) 650 MG CR tablet Take 1 tablet (650 mg total) by mouth every 8 (eight) hours as needed for pain. 24 tablet 0   albuterol  (VENTOLIN  HFA) 108 (90 Base) MCG/ACT inhaler Inhale 1 puff into the lungs every 4 (four) hours as needed.     amiodarone  (PACERONE ) 200 MG tablet TAKE 1 TABLET BY MOUTH EVERY DAY 90 tablet 3   amLODipine  (NORVASC ) 2.5 MG tablet Take 2.5 mg by mouth daily.     atorvastatin  (LIPITOR) 40 MG tablet TAKE 1 TABLET (40 MG TOTAL) BY MOUTH IN THE MORNING. FOR CHOLESTEROL. 90 tablet 2   atropine 1 % ophthalmic solution USE 1 DROP IN THE LEFT EYE ONCE DAILY     bisacodyl  (DULCOLAX) 5 MG EC tablet Take 1 tablet (5 mg total) by mouth daily as needed for moderate constipation.     Cholecalciferol  25 MCG (1000 UT) tablet Take 1,000 Units by mouth daily.     cyclobenzaprine  (FLEXERIL ) 5 MG tablet Take 1 tablet (5 mg  total) by mouth 3 (three) times daily for 5 days. 15 tablet 0   dorzolamide-timolol (COSOPT) 2-0.5 % ophthalmic solution Place 1 drop into the left eye 3 (three) times daily.     fexofenadine (ALLEGRA) 60 MG tablet Take 60 mg by mouth daily.     levothyroxine  (SYNTHROID ) 25 MCG tablet Take 25 mcg by mouth daily.     losartan  (COZAAR ) 50 MG tablet Take 1 tablet (50 mg total) by mouth daily. 90 tablet 3   magnesium  oxide (MAG-OX) 400 (240 Mg) MG tablet Take 400 mg by mouth daily.     meclizine  (ANTIVERT ) 25 MG tablet Take 1 tablet (25 mg total) by mouth every 6 (six) hours as needed. 180 tablet 1   metFORMIN  (GLUCOPHAGE ) 500 MG tablet Take 1 tablet by mouth 2 (two) times daily with a meal.     metoprolol  succinate (TOPROL -XL) 25 MG 24 hr tablet Take 1 tablet (25 mg total) by mouth daily. Take with or immediately following a meal. 90 tablet 3   montelukast  (SINGULAIR ) 10 MG tablet Take 10 mg by mouth at bedtime.       nitroGLYCERIN  (NITROSTAT ) 0.4 MG SL tablet Place 1 tablet (0.4 mg total) under the tongue every 5 (five) minutes as needed for chest pain. 20 tablet 12   Omega-3 Fatty Acids (FISH OIL) 1000 MG CAPS Take 1,000 mg by mouth in the morning and at bedtime.     omeprazole (PRILOSEC) 40 MG capsule Take 40 mg by mouth daily.       oxyCODONE -acetaminophen  (PERCOCET) 7.5-325 MG tablet Take 1 tablet by mouth every 8 (eight) hours as needed for severe pain (pain score 7-10). 12 tablet 0   pioglitazone (ACTOS) 30 MG tablet Take 30 mg by mouth daily.     polyethylene glycol (MIRALAX  / GLYCOLAX ) 17 g packet Take 17 g by mouth 2 (two) times daily.  predniSONE  (DELTASONE ) 20 MG tablet Take 1 tablet (20 mg total) by mouth daily with breakfast for 2 days. 2 tablet 0   pregabalin  (LYRICA ) 100 MG capsule Take 100 mg by mouth 2 (two) times daily.     sertraline  (ZOLOFT ) 50 MG tablet Take 50 mg by mouth daily.     TRULICITY 1.5 MG/0.5ML SOPN Inject into the skin once a week.     XARELTO  15 MG TABS tablet  TAKE 1 TABLET (15 MG TOTAL) BY MOUTH DAILY. 90 tablet 1   Cyanocobalamin  (VITAMIN B-12 IJ) Inject as directed. Monthly       Discharge Medications: Please see discharge summary for a list of discharge medications.  Relevant Imaging Results:  Relevant Lab Results:   Additional Information SSN: [id number]  [name] Ring, RN

## 2024-03-15 NOTE — [place] FL2 (Deleted)
 [place]  MEDICAID FL2 LEVEL OF CARE FORM     IDENTIFICATION  Patient Name: Sarah Potts Birthdate: Mar 06, 1933 Sex: female Admission Date (Current Location): 03/15/2024  [place] Number:  Chiropodist and Address:  [facility], [address] [place], [place] [address]      Provider Number: [id number]  Attending Physician Name and Address:  Claudene Rover, MD  Relative Name and Phone Number:  Beverley Miu- Friend [phone]    Current Level of Care: Other (Comment) (Emergency room border for rehab) Recommended Level of Care:   Prior Approval Number:    Date Approved/Denied:   PASRR Number: [id number]  Discharge Plan: SNF    Current Diagnoses: Patient Active Problem List   Diagnosis Date Noted   Status post kyphoplasty 03/12/2024   Lumbar radiculopathy, right 03/12/2024   Intractable back pain 03/12/2024   Class 1 obesity 03/10/2024   Chronic right hip pain 03/09/2024   Acute on chronic low back pain 01/18/2024   Sciatica associated with disorder of lumbar spine 01/15/2024   Back pain 01/15/2024   Dizziness 06/29/2023   HFrEF (heart failure with reduced ejection fraction) (HCC) 06/29/2023   Closed right tibial fracture 03/13/2023   Fall 03/06/2023   H/O adenomatous polyp of colon 09/29/2022   Stress-induced cardiomyopathy    Ischemic cardiomyopathy    NSTEMI (non-ST elevated myocardial infarction) (HCC) 12/20/2021   Atrial fibrillation, chronic (HCC) 12/20/2021   Type II diabetes mellitus with renal manifestations (HCC) 12/20/2021   Obesity with body mass index (BMI) of 30.0 to 39.9 12/20/2021   Chronic diastolic CHF (congestive heart failure) (HCC) 12/20/2021   UTI (urinary tract infection) 12/20/2021   Asthma 12/01/2021   Atrial fibrillation (HCC) 12/01/2021   Colostomy in place (HCC) 03/16/2021   Absolute anemia 01/09/2021   Lung nodule 01/09/2021   Anemia in stage 3a chronic kidney disease (HCC) 01/09/2021    B12 deficiency 01/09/2021   Stage 3a chronic kidney disease (HCC) 01/09/2021   Goals of care, counseling/discussion 12/18/2020   History of anal cancer 12/11/2020   Diarrhea 01/02/2014   Iron  deficiency anemia 01/02/2014   Bronchitis 12/15/2012   CAD (coronary artery disease) 11/13/2010   HTN (hypertension) 11/13/2010   Diabetes mellitus (HCC) 11/13/2010   Hyperlipidemia 11/13/2010    Orientation RESPIRATION BLADDER Height & Weight     Self, Time, Situation, Place  Normal Continent Weight: 91 kg Height:  5' 2 (157.5 cm)  BEHAVIORAL SYMPTOMS/MOOD NEUROLOGICAL BOWEL NUTRITION STATUS      Colostomy Diet (Regular)  AMBULATORY STATUS COMMUNICATION OF NEEDS Skin   Extensive Assist Verbally Normal                       Personal Care Assistance Level of Assistance  Bathing, Feeding, Dressing Bathing Assistance: Limited assistance Feeding assistance: Limited assistance Dressing Assistance: Limited assistance     Functional Limitations Info  Sight, Hearing, Speech Sight Info: Adequate Hearing Info: Impaired Speech Info: Adequate    SPECIAL CARE FACTORS FREQUENCY  PT (By licensed PT), OT (By licensed OT)     PT Frequency: 5 times per week OT Frequency: 5 times per week            Contractures Contractures Info: Not present    Additional Factors Info  Code Status, Allergies Code Status Info: Full code Allergies Info: Levofloxacin Not Specified  Nausea Only Patient reports that she thinks that she could take this medication. Patient reports that she thinks that  she could take this medication.    Patient reports that she thinks that she could take this medication.  Niacin Not Specified  Other (See Comments) Hot, Flushed Pt states she get real red and hot.    Hot, Flushed  Niacin And Related Not Specified  Other (See Comments) Pt states she get real red and hot.           Current Medications (03/15/2024):  This is the current hospital active medication list Current  Facility-Administered Medications  Medication Dose Route Frequency Provider Last Rate Last Admin   acetaminophen  (TYLENOL ) tablet 650 mg  650 mg Oral Q6H PRN Jacolyn Pae, MD       albuterol  (PROVENTIL ) (2.5 MG/3ML) 0.083% nebulizer solution 2.5 mg  2.5 mg Inhalation Q4H PRN Jacolyn Pae, MD       amiodarone  (PACERONE ) tablet 200 mg  200 mg Oral Daily Siadecki, Sebastian, MD   200 mg at 03/15/24 9057   amLODipine  (NORVASC ) tablet 2.5 mg  2.5 mg Oral Daily Siadecki, Sebastian, MD   2.5 mg at 03/15/24 9057   atorvastatin  (LIPITOR) tablet 40 mg  40 mg Oral Daily Siadecki, Sebastian, MD   40 mg at 03/15/24 0943   atropine 1 % ophthalmic solution 1 drop  1 drop Left Eye Q1200 Siadecki, Sebastian, MD       bisacodyl  (DULCOLAX) EC tablet 5 mg  5 mg Oral Daily PRN Jacolyn Pae, MD       cholecalciferol  (VITAMIN D3) 25 MCG (1000 UNIT) tablet 1,000 Units  1,000 Units Oral Daily Jacolyn Pae, MD   1,000 Units at 03/15/24 0944   cyclobenzaprine  (FLEXERIL ) tablet 5 mg  5 mg Oral TID Siadecki, Sebastian, MD   5 mg at 03/15/24 0943   dorzolamide-timolol (COSOPT) 2-0.5 % ophthalmic solution 1 drop  1 drop Left Eye TID Jacolyn Pae, MD       levothyroxine  (SYNTHROID ) tablet 25 mcg  25 mcg Oral Daily Siadecki, Sebastian, MD   25 mcg at 03/15/24 0945   losartan  (COZAAR ) tablet 50 mg  50 mg Oral Daily Siadecki, Sebastian, MD   50 mg at 03/15/24 0944   magnesium  oxide (MAG-OX) tablet 400 mg  400 mg Oral Daily Siadecki, Sebastian, MD   400 mg at 03/15/24 9055   meclizine  (ANTIVERT ) tablet 25 mg  25 mg Oral Q6H PRN Jacolyn Pae, MD       metFORMIN  (GLUCOPHAGE ) tablet 500 mg  500 mg Oral BID WC Siadecki, Sebastian, MD   500 mg at 03/15/24 9278   metoprolol  succinate (TOPROL -XL) 24 hr tablet 25 mg  25 mg Oral Daily Siadecki, Sebastian, MD   25 mg at 03/15/24 9055   montelukast  (SINGULAIR ) tablet 10 mg  10 mg Oral QHS Siadecki, Sebastian, MD       nitroGLYCERIN  (NITROSTAT ) SL  tablet 0.4 mg  0.4 mg Sublingual Q5 min PRN Jacolyn Pae, MD       oxyCODONE -acetaminophen  (PERCOCET) 7.5-325 MG per tablet 1 tablet  1 tablet Oral Q8H PRN Jacolyn Pae, MD   1 tablet at 03/15/24 1006   pantoprazole  (PROTONIX ) EC tablet 40 mg  40 mg Oral Daily Siadecki, Sebastian, MD   40 mg at 03/15/24 0943   pioglitazone (ACTOS) tablet 30 mg  30 mg Oral Daily Siadecki, Sebastian, MD   30 mg at 03/15/24 0942   polyethylene glycol (MIRALAX  / GLYCOLAX ) packet 17 g  17 g Oral BID Jacolyn Pae, MD   17 g at 03/15/24 0941   predniSONE  (DELTASONE ) tablet 20  mg  20 mg Oral Q breakfast Jacolyn Pae, MD   20 mg at 03/15/24 9278   pregabalin  (LYRICA ) capsule 100 mg  100 mg Oral BID Jacolyn Pae, MD   100 mg at 03/15/24 0945   Rivaroxaban  (XARELTO ) tablet 15 mg  15 mg Oral Daily Siadecki, Sebastian, MD   15 mg at 03/15/24 9056   sertraline  (ZOLOFT ) tablet 50 mg  50 mg Oral Daily Siadecki, Sebastian, MD   50 mg at 03/15/24 0945   Current Outpatient Medications  Medication Sig Dispense Refill   acetaminophen  (ACETAMINOPHEN  8 HOUR) 650 MG CR tablet Take 1 tablet (650 mg total) by mouth every 8 (eight) hours as needed for pain. 24 tablet 0   albuterol  (VENTOLIN  HFA) 108 (90 Base) MCG/ACT inhaler Inhale 1 puff into the lungs every 4 (four) hours as needed.     amiodarone  (PACERONE ) 200 MG tablet TAKE 1 TABLET BY MOUTH EVERY DAY 90 tablet 3   amLODipine  (NORVASC ) 2.5 MG tablet Take 2.5 mg by mouth daily.     atorvastatin  (LIPITOR) 40 MG tablet TAKE 1 TABLET (40 MG TOTAL) BY MOUTH IN THE MORNING. FOR CHOLESTEROL. 90 tablet 2   atropine 1 % ophthalmic solution USE 1 DROP IN THE LEFT EYE ONCE DAILY     bisacodyl  (DULCOLAX) 5 MG EC tablet Take 1 tablet (5 mg total) by mouth daily as needed for moderate constipation.     Cholecalciferol  25 MCG (1000 UT) tablet Take 1,000 Units by mouth daily.     cyclobenzaprine  (FLEXERIL ) 5 MG tablet Take 1 tablet (5 mg total) by mouth 3 (three)  times daily for 5 days. 15 tablet 0   dorzolamide-timolol (COSOPT) 2-0.5 % ophthalmic solution Place 1 drop into the left eye 3 (three) times daily.     fexofenadine (ALLEGRA) 60 MG tablet Take 60 mg by mouth daily.     levothyroxine  (SYNTHROID ) 25 MCG tablet Take 25 mcg by mouth daily.     losartan  (COZAAR ) 50 MG tablet Take 1 tablet (50 mg total) by mouth daily. 90 tablet 3   magnesium  oxide (MAG-OX) 400 (240 Mg) MG tablet Take 400 mg by mouth daily.     meclizine  (ANTIVERT ) 25 MG tablet Take 1 tablet (25 mg total) by mouth every 6 (six) hours as needed. 180 tablet 1   metFORMIN  (GLUCOPHAGE ) 500 MG tablet Take 1 tablet by mouth 2 (two) times daily with a meal.     metoprolol  succinate (TOPROL -XL) 25 MG 24 hr tablet Take 1 tablet (25 mg total) by mouth daily. Take with or immediately following a meal. 90 tablet 3   montelukast  (SINGULAIR ) 10 MG tablet Take 10 mg by mouth at bedtime.       nitroGLYCERIN  (NITROSTAT ) 0.4 MG SL tablet Place 1 tablet (0.4 mg total) under the tongue every 5 (five) minutes as needed for chest pain. 20 tablet 12   Omega-3 Fatty Acids (FISH OIL) 1000 MG CAPS Take 1,000 mg by mouth in the morning and at bedtime.     omeprazole (PRILOSEC) 40 MG capsule Take 40 mg by mouth daily.       oxyCODONE -acetaminophen  (PERCOCET) 7.5-325 MG tablet Take 1 tablet by mouth every 8 (eight) hours as needed for severe pain (pain score 7-10). 12 tablet 0   pioglitazone (ACTOS) 30 MG tablet Take 30 mg by mouth daily.     polyethylene glycol (MIRALAX  / GLYCOLAX ) 17 g packet Take 17 g by mouth 2 (two) times daily.     predniSONE  (  DELTASONE ) 20 MG tablet Take 1 tablet (20 mg total) by mouth daily with breakfast for 2 days. 2 tablet 0   pregabalin  (LYRICA ) 100 MG capsule Take 100 mg by mouth 2 (two) times daily.     sertraline  (ZOLOFT ) 50 MG tablet Take 50 mg by mouth daily.     TRULICITY 1.5 MG/0.5ML SOPN Inject into the skin once a week.     XARELTO  15 MG TABS tablet TAKE 1 TABLET (15 MG  TOTAL) BY MOUTH DAILY. 90 tablet 1   Cyanocobalamin  (VITAMIN B-12 IJ) Inject as directed. Monthly       Discharge Medications: Please see discharge summary for a list of discharge medications.  Relevant Imaging Results:  Relevant Lab Results:   Additional Information SSN: [id number]  [name] Ring, RN

## 2024-03-15 NOTE — TOC Progression Note (Signed)
 Transition of Care [facility]) - Progression Note    Patient Details  Name: Sarah Potts MRN: [id number] Date of Birth: October 14, 1932  [facility]) CM/SW Contact  [name] Ring, RN Phone Number: 03/15/2024, 1:52 PM  Clinical Narrative:    [facility] offered a bed, patient accepts bed offer.  Barnie at [facility] notified.  CMA to start insurance authorization.     Expected Discharge Plan: Skilled Nursing Facility Barriers to Discharge: Other (must enter comment) (SNF workup started)               Expected Discharge Plan and Services   Discharge Planning Services: CM Consult Post Acute Care Choice: Skilled Nursing Facility Living arrangements for the past 2 months: Single Family Home                                       Social Drivers of Health (SDOH) Interventions SDOH Screenings   Food Insecurity: No Food Insecurity (03/09/2024)  Housing: Low Risk  (03/09/2024)  Transportation Needs: No Transportation Needs (03/09/2024)  Utilities: At Risk (03/09/2024)  Financial Resource Strain: Low Risk  (10/19/2023)   Received from [facility]  Social Connections: Socially Isolated (03/09/2024)  Tobacco Use: Low Risk  (03/09/2024)    Readmission Risk Interventions     No data to display

## 2024-03-15 NOTE — ED Notes (Signed)
 Pt placed on bedpan

## 2024-03-15 NOTE — ED Notes (Signed)
 Case management at bedside discussing places available for care

## 2024-03-15 NOTE — ED Provider Notes (Signed)
-----------------------------------------   3:09 PM on 03/15/2024 ----------------------------------------- Social worker has been working with the patient they have been able to place the patient into [facility] nursing facility and rehab.  Patient's medical workup is been largely [place] revealing besides the known compression fractures.  I will provide a 7-day prescription for the patient's oxycodone  that she is currently prescribed/taking.  Will discharge once transport is available.   Dorothyann Drivers, MD 03/15/24 1510

## 2024-03-15 NOTE — TOC Initial Note (Signed)
 Transition of Care [facility]) - Initial/Assessment Note    Patient Details  Name: Sarah Potts MRN: [id number] Date of Birth: 1933/03/20  [facility]) CM/SW Contact:    [name] Ring, RN Phone Number: 03/15/2024, 11:42 AM  Clinical Narrative:                 Patient being seen in the emergency room after a fall at home yesterday.  She was just discharged from the hospital with home health services yesterday.  She says that she had a friend that was helping her with her shower and she had gotten all dried off and was going to the toilet when her legs gave way and she fell.   She agrees to go for SNF.  She does not want to go to [facility].  CM will start bed search in [facility].   Expected Discharge Plan: Skilled Nursing Facility Barriers to Discharge: Other (must enter comment) (SNF workup started)   Patient Goals and CMS Choice Patient states their goals for this hospitalization and ongoing recovery are:: Agrees to go to rehab CMS Medicare.gov Compare Post Acute Care list provided to:: Patient Choice offered to / list presented to : Patient      Expected Discharge Plan and Services   Discharge Planning Services: CM Consult Post Acute Care Choice: Skilled Nursing Facility Living arrangements for the past 2 months: Single Family Home                                      Prior Living Arrangements/Services Living arrangements for the past 2 months: Single Family Home Lives with:: Self Patient language and need for interpreter reviewed:: Yes Do you feel safe going back to the place where you live?: Yes      Need for Family Participation in Patient Care: Yes (Comment) Care giver support system in place?: Yes (comment)   Criminal Activity/Legal Involvement Pertinent to Current Situation/Hospitalization: No - Comment as needed  Activities of Daily Living      Permission Sought/Granted Permission sought to share information with : Facility Games Developer granted to share information with : Yes, Verbal Permission Granted     Permission granted to share info w AGENCY: SNF's        Emotional Assessment Appearance:: Appears stated age Attitude/Demeanor/Rapport: Engaged Affect (typically observed): Accepting Orientation: : Oriented to Self, Oriented to Place, Oriented to  Time, Oriented to Situation Alcohol / Substance Use: Not Applicable Psych Involvement: No (comment)  Admission diagnosis:  Falls Patient Active Problem List   Diagnosis Date Noted   Status post kyphoplasty 03/12/2024   Lumbar radiculopathy, right 03/12/2024   Intractable back pain 03/12/2024   Class 1 obesity 03/10/2024   Chronic right hip pain 03/09/2024   Acute on chronic low back pain 01/18/2024   Sciatica associated with disorder of lumbar spine 01/15/2024   Back pain 01/15/2024   Dizziness 06/29/2023   HFrEF (heart failure with reduced ejection fraction) (HCC) 06/29/2023   Closed right tibial fracture 03/13/2023   Fall 03/06/2023   H/O adenomatous polyp of colon 09/29/2022   Stress-induced cardiomyopathy    Ischemic cardiomyopathy    NSTEMI (non-ST elevated myocardial infarction) (HCC) 12/20/2021   Atrial fibrillation, chronic (HCC) 12/20/2021   Type II diabetes mellitus with renal manifestations (HCC) 12/20/2021   Obesity with body mass index (BMI) of 30.0 to 39.9 12/20/2021   Chronic diastolic  CHF (congestive heart failure) (HCC) 12/20/2021   UTI (urinary tract infection) 12/20/2021   Asthma 12/01/2021   Atrial fibrillation (HCC) 12/01/2021   Colostomy in place [facility]) 03/16/2021   Absolute anemia 01/09/2021   Lung nodule 01/09/2021   Anemia in stage 3a chronic kidney disease (HCC) 01/09/2021   B12 deficiency 01/09/2021   Stage 3a chronic kidney disease (HCC) 01/09/2021   Goals of care, counseling/discussion 12/18/2020   History of anal cancer 12/11/2020   Diarrhea 01/02/2014   Iron  deficiency anemia 01/02/2014    Bronchitis 12/15/2012   CAD (coronary artery disease) 11/13/2010   HTN (hypertension) 11/13/2010   Diabetes mellitus (HCC) 11/13/2010   Hyperlipidemia 11/13/2010   PCP:  Valora Lynwood FALCON, MD Pharmacy:   [facility] [phone] [address], [place] - [address] [place] [address] Phone: [phone] Fax: [phone]  [id number] - [address] [place] - [address] [place] [address] Phone: [phone] Fax: [phone]  [facility] [address] [place] [address] Phone: [phone] Fax: [phone]     Social Drivers of Health (SDOH) Social History: SDOH Screenings   Food Insecurity: No Food Insecurity (03/09/2024)  Housing: Low Risk  (03/09/2024)  Transportation Needs: No Transportation Needs (03/09/2024)  Utilities: At Risk (03/09/2024)  Financial Resource Strain: Low Risk  (10/19/2023)   Received from [facility]  Social Connections: Socially Isolated (03/09/2024)  Tobacco Use: Low Risk  (03/09/2024)   SDOH Interventions:     Readmission Risk Interventions     No data to display

## 2024-05-03 ENCOUNTER — Encounter: Payer: Self-pay | Admitting: Oncology

## 2024-05-04 ENCOUNTER — Ambulatory Visit: Payer: Self-pay | Attending: Cardiology | Admitting: Cardiology

## 2024-05-04 ENCOUNTER — Encounter: Payer: Self-pay | Admitting: Oncology

## 2024-05-04 ENCOUNTER — Encounter: Payer: Self-pay | Admitting: Cardiology

## 2024-05-04 VITALS — BP 130/58 | HR 64 | Ht 62.0 in | Wt 180.0 lb

## 2024-05-04 DIAGNOSIS — I5020 Unspecified systolic (congestive) heart failure: Secondary | ICD-10-CM | POA: Diagnosis not present

## 2024-05-04 DIAGNOSIS — I480 Paroxysmal atrial fibrillation: Secondary | ICD-10-CM

## 2024-05-04 DIAGNOSIS — I2510 Atherosclerotic heart disease of native coronary artery without angina pectoris: Secondary | ICD-10-CM

## 2024-05-04 DIAGNOSIS — N1831 Chronic kidney disease, stage 3a: Secondary | ICD-10-CM

## 2024-05-04 DIAGNOSIS — E782 Mixed hyperlipidemia: Secondary | ICD-10-CM

## 2024-05-04 DIAGNOSIS — I10 Essential (primary) hypertension: Secondary | ICD-10-CM | POA: Diagnosis not present

## 2024-05-04 DIAGNOSIS — E1122 Type 2 diabetes mellitus with diabetic chronic kidney disease: Secondary | ICD-10-CM | POA: Diagnosis not present

## 2024-05-04 NOTE — Patient Instructions (Signed)
 Medication Instructions:  Your physician recommends that you continue on your current medications as directed. Please refer to the Current Medication list given to you today.   *If you need a refill on your cardiac medications before your next appointment, please call your pharmacy*  Lab Work: No labs ordered today  If you have labs (blood work) drawn today and your tests are completely normal, you will receive your results only by: MyChart Message (if you have MyChart) OR A paper copy in the mail If you have any lab test that is abnormal or we need to change your treatment, we will call you to review the results.  Testing/Procedures: No test ordered today   Follow-Up: At [facility], you and your health needs are our priority.  As part of our continuing mission to provide you with exceptional heart care, our providers are all part of one team.  This team includes your primary Cardiologist (physician) and Advanced Practice Providers or APPs (Physician Assistants and Nurse Practitioners) who all work together to provide you with the care you need, when you need it.  Your next appointment:   6 month(s)  Provider:   Timothy Gollan, MD    We recommend signing up for the patient portal called MyChart.  Sign up information is provided on this After Visit Summary.  MyChart is used to connect with patients for Virtual Visits (Telemedicine).  Patients are able to view lab/test results, encounter notes, upcoming appointments, etc.  Non-urgent messages can be sent to your provider as well.   To learn more about what you can do with MyChart, go to [email or web address].

## 2024-05-04 NOTE — Progress Notes (Signed)
 " Cardiology Office Note   Date:  05/04/2024  ID:  Myldred, Raju 1933-04-12, MRN [id number] PCP: Valora Lynwood FALCON, MD  [facility] Providers Cardiologist:  Evalene Lunger, MD Cardiology APP:  Gerard Frederick, NP     History of Present Illness Sarah Potts is a 89 y.o. female with a past medical history of paroxysmal atrial fibrillation, type 2 diabetes, hyperlipidemia, primary hypertension, CKD stage III, coronary artery disease status post PCI, chronic HFpEF, asthma, and anal cancer status post colostomy, who is here today for follow-up.   Coronary disease with PCI to the left circumflex in December 2019 in the setting of a non-STEMI while in [place] .  Most recent left heart catheterization September 2015 in the setting of NSTEMI with EF of 40 to 25%.  Cath revealed 30% ostial RCA, 40% proximal RCA, 20% distal RCA, left circumflex mid to distal 10%, proximal left circumflex 40%, proximal to mid LAD 50%, ostial proximal LAD 40%.  Mild to moderate nonobstructive CAD with patent stents, moderate to severely calcified coronary arteries, suspected stress-induced cardiomyopathy.  Repeat echocardiogram in January 2024 revealed LVEF of 60 to 65%.  Longstanding history of atrial fibrillation diagnosed in 2016 in [place]  had been on chronic rivaroxaban , amiodarone  and metoprolol .  She was hospitalized at [facility] 3/16 - 07/01/2023 with a recent NSTEMI.  She presented to the hospital with generalized weakness, dizziness, double vision, and unsteady gait.  She was recently evaluated in the emergency department 07/02/2023 and was treated with a Z-Pak and doxycycline .  High-sensitivity troponins peaked at 162.  She was treated for an acute NSTEMI with IV heparin  for total of 48 hours.  She underwent nuclear stress testing 3/15 that showed normal LV perfusion was considered low risk study.  Echocardiogram revealed LVEF 55-60%, G1 DD, moderate LVH, mitral valve and aortic valve calcifications without  stenosis.  Rivaroxaban  was to be restarted at discharge.  She was considered stable and discharged from the facility on 07/01/2023.  She was seen in clinic 07/10/2023 by Dr. Gollan with complaint of dizziness, shortness of breath with overexertion and twinges in her chest.  Lipitor was increased to 40 mg daily, rivaroxaban  15 mg was continued.  She was restarted on metoprolol  succinate 25 mg daily and losartan  50 mg daily.  Will avoid restarting amlodipine  given prior history of leg swelling.  She was seen in clinic in April 2025 and she had called the nurse triage line due to significantly elevated blood pressure.  Overall she had been doing well and her blood pressures been extremely elevated she was unsure as to why.  She did recently have medication changes made during her hospitalization.  She was previously seen in clinic 10/02/2023 stating overall she was doing fairly well from a cardiac perspective.  She noted a decrease in her blood pressure in the afternoon since recently being started back on amlodipine  and gabapentin  for neuropathy.  She been having compliant with remainder of her medications.  There were no other medication changes and no further testing that was ordered at that time.  She was last seen in clinic 01/05/2024 stating overall the cardiac restrictions she was doing okay.  She had a cough since about August and been congested.  She will to rounds of anabiotic's and prednisone  with no relief.  PCR swab was negative for COVID/flu/RSV.  She did not miss any doses of her rivaroxaban  and denied any bleeding with no blood noted in her urine or stool.  She was sent for  updated lab abs.  No further testing or medication changes were made.   She returns to clinic today accompanied by family member.  She states that overall she has been doing well.  She fell back in October fracturing her back hospital twice in rehab twice since the last time she was seen in clinic.  When she was in rehab she  had episodes of hypotension and was started on midodrine  which has now been discontinued.  She continues to reside at [facility].  She denies any missed doses of rivaroxaban .  Denies any bleeding with no blood noted in her urine or stool.  States that when she was in rehab large quantity of her medications were changed but she appears to be back on her regular doses of medications at this time.  She denies any chest pain, shortness of breath or peripheral edema.  ROS: 10 point review of system has been reviewed and considered negative except ones are listed in the HPI  Studies Reviewed     Lexiscan  MPI 06/30/2023   Normal pharmacologic myocardial perfusion stress test without evidence of significant ischemia or scar.   Left ventricular systolic function is normal (LVEF > 65%).   Coronary artery calcification/stent(s) and aortic atherosclerosis are noted on the attenuation correction CT.   This is a low-risk study.   2D echo 06/29/2023 1. Left ventricular ejection fraction, by estimation, is 55 to 60%. The  left ventricle has normal function. The left ventricle has no regional  wall motion abnormalities. There is moderate left ventricular hypertrophy.  Left ventricular diastolic  parameters are consistent with Grade I diastolic dysfunction (impaired  relaxation). The global longitudinal strain is abnormal.   2. Right ventricular systolic function is normal. The right ventricular  size is normal. There is normal pulmonary artery systolic pressure.   3. Left atrial size was mildly dilated.   4. The mitral valve is abnormal. No evidence of mitral valve  regurgitation. No evidence of mitral stenosis. Severe mitral annular  calcification.   5. The aortic valve is normal in structure. Aortic valve regurgitation is  not visualized. Aortic valve sclerosis/calcification is present, without  any evidence of aortic stenosis.   6. The inferior vena cava is normal in size with greater than 50%   respiratory variability, suggesting right atrial pressure of 3 mmHg.  Risk Assessment/Calculations  CHA2DS2-VASc Score = 7   This indicates a 11.2% annual risk of stroke. The patient's score is based upon: CHF History: 1 HTN History: 1 Diabetes History: 1 Stroke History: 0 Vascular Disease History: 1 Age Score: 2 Gender Score: 1            Physical Exam VS:  BP (!) 130/58 (BP Location: Left Arm, Patient Position: Sitting, Cuff Size: Normal)   Pulse 64   Ht 5' 2 (1.575 m)   Wt 180 lb (81.6 kg)   SpO2 97%   BMI 32.92 kg/m        Wt Readings from Last 3 Encounters:  05/04/24 180 lb (81.6 kg)  03/15/24 200 lb 9.9 oz (91 kg)  03/09/24 180 lb (81.6 kg)    GEN: Well nourished, well developed in no acute distress NECK: No JVD; No carotid bruits CARDIAC: RRR, no murmurs, rubs, gallops RESPIRATORY:  Clear to auscultation without rales, wheezing or rhonchi  ABDOMEN: Soft, non-tender, non-distended EXTREMITIES:  No edema; No deformity   ASSESSMENT AND PLAN Chronic HFimpEF with last echocardiogram completed March 2025 revealed an LVEF 55 to 60%, no  RWMA, moderate LVH, G1 DD, and severe mitral annular calcification aortic valve sclerosis without stenosis.  She appears to be euvolemic on exam today.  Continues to suffer from NYHA class II symptoms.  She is continued on Toprol -XL 25 mg daily she does not have an active candidate for SGLT2 inhibitor with limited mobility.  Coronary disease with stable angina last stent placed in 2019 to the left circumflex with repeat catheterization 2023 revealed nonobstructive disease.  She denies any angina or symptoms of decompensation.  She is continued on rivaroxaban  20 mg daily and lieu of aspirin  and atorvastatin  40 mg daily.  No further ischemic evaluation is needed at this time.  Primary hypertension with a blood pressure today 130/58.  Blood pressure has remained stable on amlodipine  2.5 mg daily and losartan  50 mg daily and Toprol -XL 25 mg  daily.  Previously she was on midodrine  due to bouts of hypotension that has since been discontinued.  Paroxysmal atrial fibrillation which she is maintaining sinus rhythm today.  She is continued on rivaroxaban  20 mg daily for CHA2DS2-VASc score of at least 7 for stroke prophylaxis and Toprol -XL as well as amiodarone .  Recent labs showed stable thyroid  function.  She has tolerated oral anticoagulation without issues of bleeding.  Chronic renal insufficiency CKD stage III with stable kidney function.  She has been encouraged to continue to maintain adequate hydration and avoid NSAIDs.  Mixed hyperlipidemia with a last LDL 43 which remains at goal.  She is continued on atorvastatin  40 mg daily.  Type 2 diabetes where she is continued on metformin , Trulicity, and pioglitazone .  Ongoing management per PCP.       Dispo: Patient to return to clinic to see MD/APP in 6 months or sooner if needed for further evaluation.  Signed, Paola Flynt, NP   "

## 2024-05-20 ENCOUNTER — Other Ambulatory Visit: Payer: Self-pay | Admitting: Cardiovascular Disease
